# Patient Record
Sex: Female | Born: 1950 | Race: White | Hispanic: No | Marital: Single | Smoking: Never smoker
Health system: Southern US, Community
[De-identification: ages and names within clinical notes are randomized; demographics above are authoritative.]

## PROBLEM LIST (undated history)

## (undated) DIAGNOSIS — M1990 Unspecified osteoarthritis, unspecified site: Secondary | ICD-10-CM

## (undated) DIAGNOSIS — Z8489 Family history of other specified conditions: Secondary | ICD-10-CM

## (undated) DIAGNOSIS — E079 Disorder of thyroid, unspecified: Secondary | ICD-10-CM

## (undated) DIAGNOSIS — I10 Essential (primary) hypertension: Secondary | ICD-10-CM

## (undated) DIAGNOSIS — T7840XA Allergy, unspecified, initial encounter: Secondary | ICD-10-CM

## (undated) DIAGNOSIS — E785 Hyperlipidemia, unspecified: Secondary | ICD-10-CM

## (undated) DIAGNOSIS — N390 Urinary tract infection, site not specified: Secondary | ICD-10-CM

## (undated) DIAGNOSIS — B019 Varicella without complication: Secondary | ICD-10-CM

## (undated) DIAGNOSIS — K219 Gastro-esophageal reflux disease without esophagitis: Secondary | ICD-10-CM

## (undated) DIAGNOSIS — K635 Polyp of colon: Secondary | ICD-10-CM

## (undated) DIAGNOSIS — R519 Headache, unspecified: Secondary | ICD-10-CM

## (undated) DIAGNOSIS — E119 Type 2 diabetes mellitus without complications: Secondary | ICD-10-CM

## (undated) DIAGNOSIS — E039 Hypothyroidism, unspecified: Secondary | ICD-10-CM

## (undated) HISTORY — DX: Polyp of colon: K63.5

## (undated) HISTORY — DX: Hyperlipidemia, unspecified: E78.5

## (undated) HISTORY — DX: Urinary tract infection, site not specified: N39.0

## (undated) HISTORY — DX: Disorder of thyroid, unspecified: E07.9

## (undated) HISTORY — DX: Essential (primary) hypertension: I10

## (undated) HISTORY — PX: DILATION AND CURETTAGE OF UTERUS: SHX78

## (undated) HISTORY — DX: Headache, unspecified: R51.9

## (undated) HISTORY — DX: Varicella without complication: B01.9

## (undated) HISTORY — DX: Gastro-esophageal reflux disease without esophagitis: K21.9

## (undated) HISTORY — PX: PLANTAR FASCIA SURGERY: SHX746

## (undated) HISTORY — PX: OTHER SURGICAL HISTORY: SHX169

## (undated) HISTORY — PX: KNEE ARTHROSCOPY: SHX127

## (undated) HISTORY — DX: Allergy, unspecified, initial encounter: T78.40XA

## (undated) HISTORY — PX: HEMORROIDECTOMY: SUR656

## (undated) HISTORY — DX: Unspecified osteoarthritis, unspecified site: M19.90

## (undated) HISTORY — PX: CYST EXCISION: SHX5701

## (undated) HISTORY — PX: BILATERAL CARPAL TUNNEL RELEASE: SHX6508

---

## 2017-07-09 DIAGNOSIS — B029 Zoster without complications: Secondary | ICD-10-CM | POA: Insufficient documentation

## 2017-07-09 HISTORY — DX: Zoster without complications: B02.9

## 2020-11-04 ENCOUNTER — Encounter: Payer: Self-pay | Admitting: Family

## 2020-11-04 ENCOUNTER — Other Ambulatory Visit: Payer: Self-pay

## 2020-11-04 ENCOUNTER — Ambulatory Visit (INDEPENDENT_AMBULATORY_CARE_PROVIDER_SITE_OTHER): Payer: Medicare Other | Admitting: Family

## 2020-11-04 VITALS — BP 132/80 | HR 82 | Temp 98.1°F | Ht 67.0 in | Wt 268.8 lb

## 2020-11-04 DIAGNOSIS — N898 Other specified noninflammatory disorders of vagina: Secondary | ICD-10-CM | POA: Diagnosis not present

## 2020-11-04 DIAGNOSIS — I10 Essential (primary) hypertension: Secondary | ICD-10-CM

## 2020-11-04 DIAGNOSIS — T148XXA Other injury of unspecified body region, initial encounter: Secondary | ICD-10-CM

## 2020-11-04 DIAGNOSIS — N840 Polyp of corpus uteri: Secondary | ICD-10-CM

## 2020-11-04 DIAGNOSIS — Z1231 Encounter for screening mammogram for malignant neoplasm of breast: Secondary | ICD-10-CM | POA: Diagnosis not present

## 2020-11-04 DIAGNOSIS — B0229 Other postherpetic nervous system involvement: Secondary | ICD-10-CM

## 2020-11-04 DIAGNOSIS — G5603 Carpal tunnel syndrome, bilateral upper limbs: Secondary | ICD-10-CM

## 2020-11-04 DIAGNOSIS — G4730 Sleep apnea, unspecified: Secondary | ICD-10-CM

## 2020-11-04 DIAGNOSIS — Z9889 Other specified postprocedural states: Secondary | ICD-10-CM

## 2020-11-04 DIAGNOSIS — Z7189 Other specified counseling: Secondary | ICD-10-CM

## 2020-11-04 DIAGNOSIS — J309 Allergic rhinitis, unspecified: Secondary | ICD-10-CM

## 2020-11-04 DIAGNOSIS — M722 Plantar fascial fibromatosis: Secondary | ICD-10-CM

## 2020-11-04 DIAGNOSIS — E785 Hyperlipidemia, unspecified: Secondary | ICD-10-CM

## 2020-11-04 DIAGNOSIS — R42 Dizziness and giddiness: Secondary | ICD-10-CM

## 2020-11-04 HISTORY — DX: Other specified postprocedural states: Z98.890

## 2020-11-04 HISTORY — DX: Plantar fascial fibromatosis: M72.2

## 2020-11-04 HISTORY — DX: Carpal tunnel syndrome, bilateral upper limbs: G56.03

## 2020-11-04 HISTORY — DX: Other specified counseling: Z71.89

## 2020-11-04 HISTORY — DX: Other injury of unspecified body region, initial encounter: T14.8XXA

## 2020-11-04 MED ORDER — LEVOTHYROXINE SODIUM 25 MCG PO TABS: 25.0000 ug | ORAL_TABLET | Freq: Every day | ORAL | 1 refills | Status: DC

## 2020-11-04 MED ORDER — FENOFIBRATE 134 MG PO CAPS: 134.0000 mg | ORAL_CAPSULE | Freq: Every day | ORAL | 1 refills | Status: DC

## 2020-11-04 MED ORDER — ATORVASTATIN CALCIUM 10 MG PO TABS: 1.0000 | ORAL_TABLET | Freq: Every day | ORAL | 3 refills | Status: DC

## 2020-11-04 MED ORDER — AMITRIPTYLINE HCL 10 MG PO TABS: 10.0000 mg | ORAL_TABLET | Freq: Every day | ORAL | 1 refills | Status: DC

## 2020-11-04 MED ORDER — GABAPENTIN 300 MG PO CAPS: 300.0000 mg | ORAL_CAPSULE | Freq: Two times a day (BID) | ORAL | 0 refills | Status: DC

## 2020-11-04 MED ORDER — OMEPRAZOLE 20 MG PO CPDR: 1.0000 | DELAYED_RELEASE_CAPSULE | Freq: Every day | ORAL | 1 refills | Status: DC

## 2020-11-04 MED ORDER — LOSARTAN POTASSIUM 50 MG PO TABS: 1.0000 | ORAL_TABLET | Freq: Every day | ORAL | 1 refills | Status: DC

## 2020-11-04 MED ORDER — LEVOCETIRIZINE DIHYDROCHLORIDE 5 MG PO TABS: 5.0000 mg | ORAL_TABLET | Freq: Every day | ORAL | 3 refills | Status: DC

## 2020-11-04 NOTE — Assessment & Plan Note (Signed)
Uses CPAP while sleeping nightly.

## 2020-11-04 NOTE — Progress Notes (Signed)
Tricia Dodson is a 70 y.o. female with the following history as recorded in EpicCare:  There are no problems to display for this patient.   Current Outpatient Medications  Medication Sig Dispense Refill  . Ascorbic Acid (VITAMIN C) 100 MG tablet Take 100 mg by mouth daily.    . vitamin E 1000 UNIT capsule Take 1,000 Units by mouth daily.    Marland Kitchen amitriptyline (ELAVIL) 10 MG tablet Take 1 tablet (10 mg total) by mouth at bedtime. 90 tablet 1  . atorvastatin (LIPITOR) 10 MG tablet Take 1 tablet (10 mg total) by mouth daily. 90 tablet 3  . fenofibrate micronized (LOFIBRA) 134 MG capsule Take 1 capsule (134 mg total) by mouth daily. 90 capsule 1  . gabapentin (NEURONTIN) 300 MG capsule Take 1 capsule (300 mg total) by mouth 2 (two) times daily. 180 capsule 0  . levocetirizine (XYZAL) 5 MG tablet Take 1 tablet (5 mg total) by mouth daily. 90 tablet 3  . levothyroxine (SYNTHROID) 25 MCG tablet Take 1 tablet (25 mcg total) by mouth daily. 90 tablet 1  . losartan (COZAAR) 50 MG tablet Take 1 tablet (50 mg total) by mouth daily. 90 tablet 1  . omeprazole (PRILOSEC) 20 MG capsule Take 1 capsule (20 mg total) by mouth daily. 90 capsule 1   No current facility-administered medications for this visit.    Allergies: Patient has no known allergies.  No past medical history on file.    No family history on file.  Social History   Tobacco Use  . Smoking status: Former Games developer  . Smokeless tobacco: Never Used  Substance Use Topics  . Alcohol use: Never    Subjective:  Presents today as a new patient; moved here from [place] in December 2021- needing to get established with new provider;  History of : 1) HTN; 2) Hyperlipidemia on Atorvastatin and Fenofibrate; 3) Allergic Rhinitis; 4) Hypothyroidism; 5) GERD; 6) PHN; 7) Sleep Apnea; 8) Chronic Insomnia;    Objective:  Vitals:   11/04/20 1330  BP: 132/80  Pulse: 82  Temp: 98.1 F (36.7 C)  TempSrc: Oral  SpO2: 96%  Weight: 268 lb 12.8 oz (121.9  kg)  Height: 5\' 7"  (1.702 m)    General: Well developed, well nourished, in no acute distress  Skin : Warm and dry.  Head: Normocephalic and atraumatic  Eyes: Sclera and conjunctiva clear; pupils round and reactive to light; extraocular movements intact  Ears: External normal; canals clear; tympanic membranes normal  Oropharynx: Pink, supple. No suspicious lesions  Neck: Supple without thyromegaly, adenopathy  Lungs: Respirations unlabored; clear to auscultation bilaterally without wheeze, rales, rhonchi  CVS exam: normal rate and regular rhythm.  Musculoskeletal: No deformities; no active joint inflammation  Extremities: No edema, cyanosis, clubbing  Vessels: Symmetric bilaterally  Neurologic: Alert and oriented; speech intact; face symmetrical; moves all extremities well; CNII-XII intact without focal deficit   Assessment:  1. PHN (postherpetic neuralgia)   2. Visit for screening mammogram   3. Vaginal itching   4. Uterine polyp   5. Sleep apnea, unspecified type   6. Dizziness   7. Primary hypertension   8. Hyperlipidemia, unspecified hyperlipidemia type   9. Allergic rhinitis, unspecified seasonality, unspecified trigger     Plan:  1. Increase Gabapentin to 300 mg bid; evaluate response in 1 month;  2. Order updated; 3. Chronic issue- per patient, recent blood sugar normal; refer to GYN- ? Lichen sclerosis; 4. Refer to GYN- known issue; to discuss management with  GYN; 5. Refer to neurology- needs updated sleep study in order to get new CPAP; 6. ? Etiology; she will start checking her blood pressure 2x per day; refer to cardiology- need to consider A. Fib with her symptom complaints; strong FH of CAD on paternal side;  7. Stable; continue same medications for now; 8. Stable; refills updated; request recent labs from previous PCP; 9. Stable; refill updated;  This visit occurred during the SARS-CoV-2 public health emergency.  Safety protocols were in place, including screening  questions prior to the visit, additional usage of staff PPE, and extensive cleaning of exam room while observing appropriate contact time as indicated for disinfecting solutions.     Return in about 1 month (around 12/04/2020).  Orders Placed This Encounter  Procedures  . MM Digital Screening    Standing Status:   Future    Standing Expiration Date:   11/04/2021    Order Specific Question:   Reason for Exam (SYMPTOM  OR DIAGNOSIS REQUIRED)    Answer:   screening mammogram    Order Specific Question:   Preferred imaging location?    Answer:   [facility]  . Ambulatory referral to Gynecology    Referral Priority:   Routine    Referral Type:   Consultation    Referral Reason:   Specialty Services Required    Requested Specialty:   Gynecology    Number of Visits Requested:   1  . Ambulatory referral to Neurology    Referral Priority:   Routine    Referral Type:   Consultation    Referral Reason:   Specialty Services Required    Requested Specialty:   Neurology    Number of Visits Requested:   1  . Ambulatory referral to Cardiology    Referral Priority:   Routine    Referral Type:   Consultation    Referral Reason:   Specialty Services Required    Requested Specialty:   Cardiology    Number of Visits Requested:   1    Requested Prescriptions   Signed Prescriptions Disp Refills  . gabapentin (NEURONTIN) 300 MG capsule 180 capsule 0    Sig: Take 1 capsule (300 mg total) by mouth 2 (two) times daily.  Marland Kitchen amitriptyline (ELAVIL) 10 MG tablet 90 tablet 1    Sig: Take 1 tablet (10 mg total) by mouth at bedtime.  Marland Kitchen atorvastatin (LIPITOR) 10 MG tablet 90 tablet 3    Sig: Take 1 tablet (10 mg total) by mouth daily.  . fenofibrate micronized (LOFIBRA) 134 MG capsule 90 capsule 1    Sig: Take 1 capsule (134 mg total) by mouth daily.  Marland Kitchen levocetirizine (XYZAL) 5 MG tablet 90 tablet 3    Sig: Take 1 tablet (5 mg total) by mouth daily.  Marland Kitchen levothyroxine (SYNTHROID) 25 MCG tablet 90  tablet 1    Sig: Take 1 tablet (25 mcg total) by mouth daily.  Marland Kitchen losartan (COZAAR) 50 MG tablet 90 tablet 1    Sig: Take 1 tablet (50 mg total) by mouth daily.  Marland Kitchen omeprazole (PRILOSEC) 20 MG capsule 90 capsule 1    Sig: Take 1 capsule (20 mg total) by mouth daily.

## 2020-11-16 ENCOUNTER — Other Ambulatory Visit (HOSPITAL_BASED_OUTPATIENT_CLINIC_OR_DEPARTMENT_OTHER): Payer: Self-pay | Admitting: Family

## 2020-11-21 ENCOUNTER — Encounter (HOSPITAL_BASED_OUTPATIENT_CLINIC_OR_DEPARTMENT_OTHER): Payer: Self-pay

## 2020-11-21 ENCOUNTER — Ambulatory Visit (HOSPITAL_BASED_OUTPATIENT_CLINIC_OR_DEPARTMENT_OTHER)
Admission: RE | Admit: 2020-11-21 | Discharge: 2020-11-21 | Disposition: A | Discharge status: Home / Self Care | Payer: Medicare Other | Source: Ambulatory Visit | Attending: Family | Admitting: Family

## 2020-11-21 ENCOUNTER — Other Ambulatory Visit: Payer: Self-pay

## 2020-11-21 DIAGNOSIS — Z1231 Encounter for screening mammogram for malignant neoplasm of breast: Secondary | ICD-10-CM

## 2020-11-28 ENCOUNTER — Other Ambulatory Visit: Payer: Self-pay | Admitting: Family

## 2020-11-28 DIAGNOSIS — R928 Other abnormal and inconclusive findings on diagnostic imaging of breast: Secondary | ICD-10-CM

## 2020-11-30 ENCOUNTER — Encounter: Payer: Self-pay | Admitting: Neurology

## 2020-11-30 ENCOUNTER — Ambulatory Visit (INDEPENDENT_AMBULATORY_CARE_PROVIDER_SITE_OTHER): Payer: Medicare Other | Admitting: Neurology

## 2020-11-30 VITALS — BP 132/84 | HR 65 | Ht 67.0 in | Wt 270.3 lb

## 2020-11-30 DIAGNOSIS — M2619 Other specified anomalies of jaw-cranial base relationship: Secondary | ICD-10-CM

## 2020-11-30 DIAGNOSIS — Z9989 Dependence on other enabling machines and devices: Secondary | ICD-10-CM | POA: Insufficient documentation

## 2020-11-30 DIAGNOSIS — G4733 Obstructive sleep apnea (adult) (pediatric): Secondary | ICD-10-CM

## 2020-11-30 HISTORY — DX: Obstructive sleep apnea (adult) (pediatric): G47.33

## 2020-11-30 HISTORY — DX: Morbid (severe) obesity due to excess calories: E66.01

## 2020-11-30 HISTORY — DX: Other specified anomalies of jaw-cranial base relationship: M26.19

## 2020-11-30 HISTORY — DX: Dependence on other enabling machines and devices: Z99.89

## 2020-11-30 NOTE — Progress Notes (Signed)
SLEEP MEDICINE CLINIC    Provider:  Melvyn Novasarmen  Sheri Gatchel, MD  Primary Care Physician:  Tricia BassMurray, Laura Woodruff, FNP [address] [place] [id number]     Referring Provider: Olive BassMurray, Laura Woodruff, Fnp [address] [place] [id number]          Chief Complaint according to patient   Patient presents with:    . New Patient (Initial Visit)     Prior sleep study has been on CPAP for over 15 years. Reports she is here to start process of getting new CPAP machine. CPAP is 70 years old and displayed a message thati t has exceeded its lifetime. "[place] [facility] I am CPAP dependent "      HISTORY OF PRESENT ILLNESS:  Tricia Dodson is a 70 - year- old Caucasian female patient seen here upon FNP referral on 11/30/2020 from Tricia Dodson, her primary care provider.   Chief concern according to patient : see above     Tricia Dodson  has a past medical history of Allergy, Arthritis, Chicken pox, Colon polyps, GERD (gastroesophageal reflux disease), HA (headache), Hyperlipidemia, Hypertension, Thyroid disease, and UTI (urinary tract infection)..   The patient had the first sleep study in the year 2005  with a result of OSA being found. She is on her third CPAP, 70 years old.  She had surgery for her foot and her anesthetist asked her if she knew she had apnea, she did not. A year later she was tested. .    Sleep relevant medical history: Nocturia-none since CPAP-insomnia  In some nights. Obesity, sinus rhinitis-  Goiter.    Family medical /sleep history:Brother  on CPAP with OSA.    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  and lives in a household alone. No children. Pets are Present. 3 cats. 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  She had insomnia often at that time.  Tobacco use; none .   ETOH use; socially , Caffeine intake in form of  Coffee( 1 cup in AM) Tea (not and iced -  3 or more a day) . Regular exercise in form of walking. Has knee pain.         Sleep habits are as follows: The patient's dinner time is between 6 PM. The patient goes to bed at 11-12 PM and continues to sleep for intervals of 3 hours, wakes for 30 minutes, reads in book, not bathroom breaks.   The preferred sleep position is laterally , with the support of one pillow.GERD in the past, not currently.  Dreams are reportedly infrequent. 8  AM is the usual rise time.  The patient wakes up spontaneously.  She reports not feeling refreshed or restored in AM if not using CPAP-, but on CPAP she re rarely has  symptoms such as dry mouth , morning headaches , congestion- and residual fatigue.  Naps are taken rarely lasting from 15-30 minutes .    Reviews of CPAP download_  Tricia Dodson has used her CPAP compliantly for the last 30 days 29 out of 30 days with a compliance of over 4 hours for 90% of the time.  Average user time is 8 hours 28 minutes.  Her air sense 10 AutoSet which reportedly is about 70 years old or older as a serial #[id number] 06/25/1966 1.  It is  on factory settings and was never tailored to her needs a minimum pressure is 4 maximum pressure is 20 EPR level is 3 cmH2O.  She has achieved a AHI residual of only 1.4 apneas and hypopneas per hour of use with these being obstructive in origin.  Air leaks are moderate to mild at the 95th percentile 11.3 L/min.  The pressure at the 95th percentile is only 8.9 cmH2O.  She is using a nasal mask , N 20.     Review of Systems: Out of a complete 14 system review, the patient complains of only the following symptoms, and all other reviewed systems are negative.:   Singles right body, pain, neuropathic.  Fatigue, sleepiness , snoring, fragmented sleep, Insomnia once or twice a month.    How likely are you to doze in the following situations: 0 = not likely, 1 = slight chance, 2 = moderate chance, 3 = high  chance   Sitting and Reading? Watching Television? Sitting inactive in a public place (theater or meeting)? As a passenger in a car for an hour without a break? Lying down in the afternoon when circumstances permit? Sitting and talking to someone? Sitting quietly after lunch without alcohol? In a car, while stopped for a few minutes in traffic?   Total = 3/ 24 points  On CPAP.   FSS endorsed at  22/ 63 points.   Social History   Socioeconomic History  . Marital status: Single    Spouse name: Not on file  . Number of children: Not on file  . Years of education: Not on file  . Highest education level: Not on file  Occupational History  . Not on file  Tobacco Use  . Smoking status: Former Games developer  . Smokeless tobacco: Never Used  Substance and Sexual Activity  . Alcohol use: Never  . Drug use: Not on file  . Sexual activity: Not on file  Other Topics Concern  . Not on file  Social History Narrative  . Not on file   Social Determinants of Health   Financial Resource Strain: Not on file  Food Insecurity: Not on file  Transportation Needs: Not on file  Physical Activity: Not on file  Stress: Not on file  Social Connections: Not on file    Family History  Problem Relation Age of Onset  . Arthritis Mother   . Diabetes Father   . Heart disease Father   . Arthritis Sister   . Diabetes Sister   . Hyperlipidemia Sister   . Hyperlipidemia Brother   . Heart disease Brother        triple heart bypass    Past Medical History:  Diagnosis Date  . Allergy   . Arthritis   . Chicken pox   . Colon polyps   . GERD (gastroesophageal reflux disease)   . HA (headache)   . Hyperlipidemia   . Hypertension   . Thyroid disease   . UTI (urinary tract infection)     Past Surgical History:  Procedure Laterality Date  . chicken pox    . right hand cyst removal       Current Outpatient Medications on File Prior to Visit  Medication Sig Dispense Refill  . amitriptyline  (ELAVIL) 10 MG tablet Take 1 tablet (10 mg total) by mouth at bedtime. 90 tablet 1  . atorvastatin (LIPITOR) 10 MG tablet Take 1 tablet (10 mg total) by mouth daily. 90 tablet 3  . fenofibrate micronized (LOFIBRA) 134 MG capsule  Take 1 capsule (134 mg total) by mouth daily. 90 capsule 1  . gabapentin (NEURONTIN) 300 MG capsule Take 1 capsule (300 mg total) by mouth 2 (two) times daily. 180 capsule 0  . levocetirizine (XYZAL) 5 MG tablet Take 1 tablet (5 mg total) by mouth daily. 90 tablet 3  . levothyroxine (SYNTHROID) 25 MCG tablet Take 1 tablet (25 mcg total) by mouth daily. 90 tablet 1  . losartan (COZAAR) 50 MG tablet Take 1 tablet (50 mg total) by mouth daily. 90 tablet 1  . omeprazole (PRILOSEC) 20 MG capsule Take 1 capsule (20 mg total) by mouth daily. 90 capsule 1   No current facility-administered medications on file prior to visit.    Physical exam:  Today's Vitals   11/30/20 0852  BP: 132/84  Pulse: 65  SpO2: 96%  Weight: 270 lb 5 oz (122.6 kg)  Height: 5\' 7"  (1.702 m)   Body mass index is 42.34 kg/m.   Wt Readings from Last 3 Encounters:  11/30/20 270 lb 5 oz (122.6 kg)  11/04/20 268 lb 12.8 oz (121.9 kg)     Ht Readings from Last 3 Encounters:  11/30/20 5\' 7"  (1.702 m)  11/04/20 5\' 7"  (1.702 m)      General: The patient is awake, alert and appears not in acute distress. The patient is well groomed. Head: Normocephalic, atraumatic.  Neck is supple. Mallampati 2 ,  neck circumference:16. 25  inches .  Nasal airflow  patent.  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  Dental status:  Cardiovascular:  Regular rate and cardiac rhythm by pulse,  without distended neck veins. Respiratory: Lungs are clear to auscultation.  Skin:  With evidence of mild ankle edema on the left- no visible rash  Trunk: The patient's posture is erect.   Neurologic exam : The patient is awake and alert, oriented to place and time.   Memory subjective described as intact.   Attention span & concentration ability appears normal.  Speech is fluent,  without  dysarthria, dysphonia or aphasia.  Mood and affect are appropriate.   Cranial nerves: no loss of smell or taste reported  Pupils are equal and briskly reactive to light. Funduscopic exam differed.  Extraocular movements in vertical and horizontal planes were intact and without nystagmus. No Diplopia. Visual fields by finger perimetry are intact. Hearing was reduced to soft voice and finger rubbing.  Tinnitus.   Facial sensation intact to fine touch.  Facial motor strength is symmetric and tongue and uvula move midline.  Neck ROM :  Right sided rotation is limited- droopy shoulders   tilt and flexion extension were normal for age and shoulder shrug was symmetrical.    Motor exam:  Symmetric bulk, tone and ROM.   Normal tone without cog- wheeling, symmetric grip strength- very reduced in strength. Left thenar eminence was reduced in size. - Status post carpal tunnel surgery.    Sensory:  Fine touch, pinprick and vibration were normal. Right body shingles have left her with some tingling in the affected area.  Proprioception tested in the upper extremities was normal.   Coordination: Rapid alternating movements in the fingers/hands were of normal speed.  The Finger-to-nose maneuver was intact without evidence of ataxia, dysmetria or tremor.   Gait and station: Patient could rise unassisted from a seated position, walked without assistive device.  Stance is of normal width/ base and the patient turned with 3 steps.  Toe and heel walk were deferred.  Deep tendon reflexes: in  the  upper and lower extremities are symmetric and intact.  Babinski response was deferred.     After spending a total time of 45 minutes face to face and additional time for physical and neurologic examination, review of laboratory studies,  personal review of imaging studies, reports and results of other testing and review of  referral information / records as far as provided in visit, I have established the following assessments:  1) successful CPAP user with reported CPAP dependence in need of a new machine to allow uninterrupted therapy.  2) advancing bed time by 45 minutes, hot shower before bedtime.  Weight loss encouraged.   3) no medication needed, sleeping well for the most time. OSA risk factors include retrognathia and poor muscle tone.    My Plan is to proceed with:  1)  PSG split -or HST to evaluate current level and type of apnea.  2)  Wants to keep Nasal mask.    I would like to thank Tricia Bass, FNP and Tricia Bass, Fnp [address] [place],  [place] [address] for allowing me to meet with and to take care of this pleasant patient.   II plan to follow up either personally or through our Tricia within 1-2 month.   CC: I will share my notes with PCP.   Electronically signed by: Tricia Novas, MD 11/30/2020 9:25 AM  [facility] Board certified by The [employer] of Sleep Medicine and Diplomate of the [employer] of Sleep Medicine. Board certified In Neurology through the ABPN, Fellow of the [employer] of Neurology. Medical Director of [facility].

## 2020-12-01 DIAGNOSIS — I10 Essential (primary) hypertension: Secondary | ICD-10-CM | POA: Insufficient documentation

## 2020-12-01 DIAGNOSIS — T7840XA Allergy, unspecified, initial encounter: Secondary | ICD-10-CM | POA: Insufficient documentation

## 2020-12-01 DIAGNOSIS — K219 Gastro-esophageal reflux disease without esophagitis: Secondary | ICD-10-CM | POA: Insufficient documentation

## 2020-12-01 DIAGNOSIS — E079 Disorder of thyroid, unspecified: Secondary | ICD-10-CM | POA: Insufficient documentation

## 2020-12-01 DIAGNOSIS — M1990 Unspecified osteoarthritis, unspecified site: Secondary | ICD-10-CM | POA: Insufficient documentation

## 2020-12-01 DIAGNOSIS — K635 Polyp of colon: Secondary | ICD-10-CM | POA: Insufficient documentation

## 2020-12-01 DIAGNOSIS — E785 Hyperlipidemia, unspecified: Secondary | ICD-10-CM | POA: Insufficient documentation

## 2020-12-01 DIAGNOSIS — E039 Hypothyroidism, unspecified: Secondary | ICD-10-CM | POA: Insufficient documentation

## 2020-12-01 DIAGNOSIS — N390 Urinary tract infection, site not specified: Secondary | ICD-10-CM | POA: Insufficient documentation

## 2020-12-01 DIAGNOSIS — B019 Varicella without complication: Secondary | ICD-10-CM | POA: Insufficient documentation

## 2020-12-01 DIAGNOSIS — R519 Headache, unspecified: Secondary | ICD-10-CM | POA: Insufficient documentation

## 2020-12-02 ENCOUNTER — Ambulatory Visit (INDEPENDENT_AMBULATORY_CARE_PROVIDER_SITE_OTHER): Payer: Medicare Other | Admitting: Cardiology

## 2020-12-02 ENCOUNTER — Other Ambulatory Visit: Payer: Self-pay

## 2020-12-02 ENCOUNTER — Ambulatory Visit (INDEPENDENT_AMBULATORY_CARE_PROVIDER_SITE_OTHER): Payer: Medicare Other

## 2020-12-02 ENCOUNTER — Encounter: Payer: Self-pay | Admitting: Cardiology

## 2020-12-02 VITALS — BP 128/76 | HR 84 | Ht 67.0 in | Wt 268.0 lb

## 2020-12-02 DIAGNOSIS — Z9989 Dependence on other enabling machines and devices: Secondary | ICD-10-CM

## 2020-12-02 DIAGNOSIS — I10 Essential (primary) hypertension: Secondary | ICD-10-CM | POA: Diagnosis not present

## 2020-12-02 DIAGNOSIS — R42 Dizziness and giddiness: Secondary | ICD-10-CM

## 2020-12-02 DIAGNOSIS — R9431 Abnormal electrocardiogram [ECG] [EKG]: Secondary | ICD-10-CM | POA: Diagnosis not present

## 2020-12-02 DIAGNOSIS — G4733 Obstructive sleep apnea (adult) (pediatric): Secondary | ICD-10-CM

## 2020-12-02 NOTE — Addendum Note (Signed)
Addended by: Hazle Quant on: 12/02/2020 10:34 AM   Modules accepted: Orders

## 2020-12-02 NOTE — Patient Instructions (Signed)
Medication Instructions:  Your physician recommends that you continue on your current medications as directed. Please refer to the Current Medication list given to you today.  *If you need a refill on your cardiac medications before your next appointment, please call your pharmacy*   Lab Work: none If you have labs (blood work) drawn today and your tests are completely normal, you will receive your results only by: Marland Kitchen MyChart Message (if you have MyChart) OR . A paper copy in the mail If you have any lab test that is abnormal or we need to change your treatment, we will call you to review the results.   Testing/Procedures: A zio monitor was ordered today. It will remain on for 14 days. You will then return monitor and event diary in provided box. It takes 1-2 weeks for report to be downloaded and returned to [place]. We will call you with the results. If monitor falls off or has orange flashing light, please call Zio for further instructions.   Your physician has requested that you have an echocardiogram. Echocardiography is a painless test that uses sound waves to create images of your heart. It provides your doctor with information about the size and shape of your heart and how well your heart's chambers and valves are working. This procedure takes approximately one hour. There are no restrictions for this procedure.     Follow-Up: At [facility], you and your health needs are our priority.  As part of our continuing mission to provide you with exceptional heart care, we have created designated Provider Care Teams.  These Care Teams include your primary Cardiologist (physician) and Advanced Practice Providers (APPs -  Physician Assistants and Nurse Practitioners) who all work together to provide you with the care you need, when you need it.  We recommend signing up for the patient portal called "MyChart".  Sign up information is provided on this After Visit Summary.  MyChart is used to  connect with patients for Virtual Visits (Telemedicine).  Patients are able to view lab/test results, encounter notes, upcoming appointments, etc.  Non-urgent messages can be sent to your provider as well.   To learn more about what you can do with MyChart, go to [email or web address].    Your next appointment:   3 month(s)  The format for your next appointment:   In Person  Provider:   Gypsy Balsam, MD   Other Instructions   Echocardiogram An echocardiogram is a test that uses sound waves (ultrasound) to produce images of the heart. Images from an echocardiogram can provide important information about:  Heart size and shape.  The size and thickness and movement of your heart's walls.  Heart muscle function and strength.  Heart valve function or if you have stenosis. Stenosis is when the heart valves are too narrow.  If blood is flowing backward through the heart valves (regurgitation).  A tumor or infectious growth around the heart valves.  Areas of heart muscle that are not working well because of poor blood flow or injury from a heart attack.  Aneurysm detection. An aneurysm is a weak or damaged part of an artery wall. The wall bulges out from the normal force of blood pumping through the body. Tell a health care provider about:  Any allergies you have.  All medicines you are taking, including vitamins, herbs, eye drops, creams, and over-the-counter medicines.  Any blood disorders you have.  Any surgeries you have had.  Any medical conditions you have.  Whether you  are pregnant or may be pregnant. What are the risks? Generally, this is a safe test. However, problems may occur, including an allergic reaction to dye (contrast) that may be used during the test. What happens before the test? No specific preparation is needed. You may eat and drink normally. What happens during the test?  You will take off your clothes from the waist up and put on a hospital  gown.  Electrodes or electrocardiogram (ECG)patches may be placed on your chest. The electrodes or patches are then connected to a device that monitors your heart rate and rhythm.  You will lie down on a table for an ultrasound exam. A gel will be applied to your chest to help sound waves pass through your skin.  A handheld device, called a transducer, will be pressed against your chest and moved over your heart. The transducer produces sound waves that travel to your heart and bounce back (or "echo" back) to the transducer. These sound waves will be captured in real-time and changed into images of your heart that can be viewed on a video monitor. The images will be recorded on a computer and reviewed by your health care provider.  You may be asked to change positions or hold your breath for a short time. This makes it easier to get different views or better views of your heart.  In some cases, you may receive contrast through an IV in one of your veins. This can improve the quality of the pictures from your heart. The procedure may vary among health care providers and hospitals.   What can I expect after the test? You may return to your normal, everyday life, including diet, activities, and medicines, unless your health care provider tells you not to do that. Follow these instructions at home:  It is up to you to get the results of your test. Ask your health care provider, or the department that is doing the test, when your results will be ready.  Keep all follow-up visits. This is important. Summary  An echocardiogram is a test that uses sound waves (ultrasound) to produce images of the heart.  Images from an echocardiogram can provide important information about the size and shape of your heart, heart muscle function, heart valve function, and other possible heart problems.  You do not need to do anything to prepare before this test. You may eat and drink normally.  After the  echocardiogram is completed, you may return to your normal, everyday life, unless your health care provider tells you not to do that. This information is not intended to replace advice given to you by your health care provider. Make sure you discuss any questions you have with your health care provider. Document Revised: 02/16/2020 Document Reviewed: 02/16/2020 Elsevier Patient Education  2021 [employer] Inc.   

## 2020-12-02 NOTE — Progress Notes (Signed)
Cardiology Consultation:    Date:  12/02/2020   ID:  Tricia Dodson, DOB Mar 19, 1951, MRN [id number]  PCP:  Tricia Bass, FNP  Cardiologist:  Gypsy Balsam, MD   Referring MD: Tricia Dodson,*   Chief Complaint  Patient presents with  . Dizziness    History of Present Illness:    Tricia Dodson is a 70 y.o. female who is being seen today for the evaluation of dizziness at the request of Tricia Dodson,*.  Recently she retired at moving to [employer] from [place].  She got established with new primary care physician during the conversation with her primary care physician she mentioned some dizziness.  Apparently about a year ago she did have some lightheadedness as well as dizziness.  She went to her primary care physician she was discovered to have hypertension she was put on losartan and symptoms improved significantly but is still described to have some lightheadedness/dizziness.  Did happen anytime momentary sensation of kind of unsteadiness in her balance.  There is no associated symptoms associated with this.  Overall she is trying to be active he is also very conscious about diet she would like to enjoy her retirement she likes writing books.  She decided also to walk on the regular basis and she does every day and she has no difficulty doing it.  Denies have any chest pain tightness squeezing pressure burning chest, no palpitations, she does have some dyspnea on exertion. She does have family history of premature coronary artery disease 2 of her uncles started having problem before age of 41. She never smoked. She is on Microdiet she said that diet is designed to lose some weight as well as to be good for overall health.  That involve using correct the fact as well as knots.   Past Medical History:  Diagnosis Date  . Allergy   . Arthritis   . Carpal tunnel syndrome, bilateral 11/04/2020   Both wrists.   . Chicken pox   . Colon polyps    . CPAP (continuous positive airway pressure) dependence 11/30/2020  . CPAP use counseling 11/04/2020  . GERD (gastroesophageal reflux disease)   . HA (headache)   . History of vein stripping 11/04/2020   Right leg  . Hyperlipidemia   . Hypertension   . Morbid obesity (HCC) 11/30/2020  . OSA on CPAP 11/30/2020  . Plantar fasciitis, bilateral 11/04/2020   With Bone spors  . 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 Shingles 2019  . Thyroid disease   . Torn ligament 11/04/2020   Right knee  . UTI (urinary tract infection)     Past Surgical History:  Procedure Laterality Date  . BILATERAL CARPAL TUNNEL RELEASE    . Hemorroids removed    . KNEE ARTHROSCOPY Right   . Plantar fasciatis      Repaired  . right hand cyst removal    . vein vascular     surgery    Current Medications: Current Meds  Medication Sig  . amitriptyline (ELAVIL) 10 MG tablet Take 1 tablet (10 mg total) by mouth at bedtime.  Marland Kitchen atorvastatin (LIPITOR) 10 MG tablet Take 1 tablet (10 mg total) by mouth daily. (Patient taking differently: Take 0.5 tablets by mouth daily.)  . fenofibrate micronized (LOFIBRA) 134 MG capsule Take 1 capsule (134 mg total) by mouth daily.  Marland Kitchen gabapentin (NEURONTIN) 300 MG capsule Take 1 capsule (300 mg total) by mouth 2 (two) times daily.  Marland Kitchen levocetirizine (XYZAL) 5 MG  tablet Take 1 tablet (5 mg total) by mouth daily.  Marland Kitchen levothyroxine (SYNTHROID) 25 MCG tablet Take 1 tablet (25 mcg total) by mouth daily.  Marland Kitchen losartan (COZAAR) 50 MG tablet Take 1 tablet (50 mg total) by mouth daily. (Patient taking differently: Take 50 mg by mouth daily.)  . omeprazole (PRILOSEC) 20 MG capsule Take 1 capsule (20 mg total) by mouth daily. (Patient taking differently: Take 20 mg by mouth daily.)     Allergies:   Patient has no known allergies.   Social History   Socioeconomic History  . Marital status: Single    Spouse name: Not on file  . Number of children: Not on file  . Years of education: Not on file  . Highest  education level: Not on file  Occupational History  . Not on file  Tobacco Use  . Smoking status: Former Games developer  . Smokeless tobacco: Never Used  Substance and Sexual Activity  . Alcohol use: Never  . Drug use: Not on file  . Sexual activity: Not on file  Other Topics Concern  . Not on file  Social History Narrative  . Not on file   Social Determinants of Health   Financial Resource Strain: Not on file  Food Insecurity: Not on file  Transportation Needs: Not on file  Physical Activity: Not on file  Stress: Not on file  Social Connections: Not on file     Family History: The patient's family history includes Arthritis in her mother and sister; Diabetes in her father and sister; Heart disease in her brother and father; Hyperlipidemia in her brother and sister. ROS:   Please see the history of present illness.    All 14 point review of systems negative except as described per history of present illness.  EKGs/Labs/Other Studies Reviewed:    The following studies were reviewed today:   EKG:  EKG is  ordered today.  The ekg ordered today demonstrates normal sinus rhythm normal P interval, left axis deviation, left anterior hemiblock, nonspecific ST segment changes  Recent Labs: No results found for requested labs within last 8760 hours.  Recent Lipid Panel No results found for: CHOL, TRIG, HDL, CHOLHDL, VLDL, LDLCALC, LDLDIRECT  Physical Exam:    VS:  BP 128/76 (BP Location: Right Arm, Patient Position: Sitting)   Pulse 84   Ht 5\' 7"  (1.702 m)   Wt 268 lb (121.6 kg)   SpO2 97%   BMI 41.97 kg/m     Wt Readings from Last 3 Encounters:  12/02/20 268 lb (121.6 kg)  11/30/20 270 lb 5 oz (122.6 kg)  11/04/20 268 lb 12.8 oz (121.9 kg)     GEN:  Well nourished, well developed in no acute distress HEENT: Normal NECK: No JVD; No carotid bruits LYMPHATICS: No lymphadenopathy CARDIAC: RRR, no murmurs, no rubs, no gallops RESPIRATORY:  Clear to auscultation without  rales, wheezing or rhonchi  ABDOMEN: Soft, non-tender, non-distended MUSCULOSKELETAL:  No edema; No deformity  SKIN: Warm and dry NEUROLOGIC:  Alert and oriented x 3 PSYCHIATRIC:  Normal affect   ASSESSMENT:    1. OSA on CPAP   2. Dizziness   3. Primary hypertension   4. Nonspecific abnormal electrocardiogram (ECG) (EKG)    PLAN:    In order of problems listed above:  1. Dizziness I will ask her to wear Zio patch for 2weeks to make sure we not dealing with any significant arrhythmia.  She will be also scheduled to have an echocardiogram to assess  left ventricle ejection fraction as well as function of her valve. 2. Essential hypertension: Blood pressure seems to be well controlled today Will do echocardiogram to look for left ventricle hypertrophy as well as to assess significance of left anterior hemiblock which probably is not significant. 3. Dyslipidemia, she is taking cholesterol medication and call her primary care physician to get her fasting lipid profile. 4. 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  Again I do not think that would be something significant. 5. We did talk about healthy lifestyle life in an alkaline I need to exercise on the regular basis and good diet.  She is already doing it.   Medication Adjustments/Labs and Tests Ordered: Current medicines are reviewed at length with the patient today.  Concerns regarding medicines are outlined above.  No orders of the defined types were placed in this encounter.  No orders of the defined types were placed in this encounter.   Signed, Georgeanna Lea, MD, [facility]. 12/02/2020 10:27 AM    [facility]

## 2020-12-06 ENCOUNTER — Ambulatory Visit: Payer: Federal, State, Local not specified - PPO | Admitting: Family

## 2020-12-07 ENCOUNTER — Ambulatory Visit: Payer: Federal, State, Local not specified - PPO | Admitting: Family

## 2020-12-15 DIAGNOSIS — R9431 Abnormal electrocardiogram [ECG] [EKG]: Secondary | ICD-10-CM

## 2020-12-15 DIAGNOSIS — R42 Dizziness and giddiness: Secondary | ICD-10-CM

## 2020-12-15 DIAGNOSIS — I10 Essential (primary) hypertension: Secondary | ICD-10-CM

## 2020-12-15 DIAGNOSIS — G4733 Obstructive sleep apnea (adult) (pediatric): Secondary | ICD-10-CM

## 2020-12-15 DIAGNOSIS — Z9989 Dependence on other enabling machines and devices: Secondary | ICD-10-CM

## 2020-12-16 ENCOUNTER — Ambulatory Visit
Admission: RE | Admit: 2020-12-16 | Discharge: 2020-12-16 | Disposition: A | Discharge status: Home / Self Care | Payer: Medicare Other | Source: Ambulatory Visit | Attending: Family | Admitting: Family

## 2020-12-16 ENCOUNTER — Ambulatory Visit: Payer: Medicare Other

## 2020-12-16 ENCOUNTER — Other Ambulatory Visit: Payer: Self-pay

## 2020-12-16 DIAGNOSIS — R928 Other abnormal and inconclusive findings on diagnostic imaging of breast: Secondary | ICD-10-CM

## 2020-12-20 ENCOUNTER — Other Ambulatory Visit: Payer: Medicare Other

## 2020-12-27 ENCOUNTER — Telehealth: Payer: Self-pay | Admitting: Cardiology

## 2020-12-27 NOTE — Telephone Encounter (Signed)
    Pt is returning call to get heart monitor result, she said to call her on her cell#

## 2020-12-27 NOTE — Telephone Encounter (Signed)
Spoke with patient regarding results and recommendation.  Patient verbalizes understanding and is agreeable to plan of care. Advised patient to call back with any issues or concerns.  

## 2021-01-02 ENCOUNTER — Ambulatory Visit (HOSPITAL_BASED_OUTPATIENT_CLINIC_OR_DEPARTMENT_OTHER)
Admission: RE | Admit: 2021-01-02 | Discharge: 2021-01-02 | Disposition: A | Discharge status: Home / Self Care | Payer: Medicare Other | Source: Ambulatory Visit | Attending: Cardiology | Admitting: Cardiology

## 2021-01-02 ENCOUNTER — Other Ambulatory Visit: Payer: Self-pay

## 2021-01-02 DIAGNOSIS — G4733 Obstructive sleep apnea (adult) (pediatric): Secondary | ICD-10-CM | POA: Diagnosis present

## 2021-01-02 DIAGNOSIS — I10 Essential (primary) hypertension: Secondary | ICD-10-CM

## 2021-01-02 DIAGNOSIS — R9431 Abnormal electrocardiogram [ECG] [EKG]: Secondary | ICD-10-CM

## 2021-01-02 DIAGNOSIS — R42 Dizziness and giddiness: Secondary | ICD-10-CM

## 2021-01-02 DIAGNOSIS — Z9989 Dependence on other enabling machines and devices: Secondary | ICD-10-CM

## 2021-01-02 LAB — ECHOCARDIOGRAM COMPLETE
AR max vel: 2.09 cm2
AV Area VTI: 2.03 cm2
AV Area mean vel: 2 cm2
AV Mean grad: 5 mmHg
AV Peak grad: 9.6 mmHg
Ao pk vel: 1.55 m/s
Area-P 1/2: 3.23 cm2
Calc EF: 73.3 %
S' Lateral: 2.4 cm
Single Plane A2C EF: 75.4 %
Single Plane A4C EF: 69.1 %

## 2021-01-02 NOTE — Progress Notes (Signed)
*  PRELIMINARY RESULTS* Echocardiogram 2D Echocardiogram has been performed.  Neomia Dear RDCS 01/02/2021, 2:42 PM

## 2021-01-04 ENCOUNTER — Other Ambulatory Visit (HOSPITAL_COMMUNITY)
Admission: RE | Admit: 2021-01-04 | Discharge: 2021-01-04 | Disposition: A | Discharge status: Home / Self Care | Payer: Medicare Other | Source: Ambulatory Visit | Attending: Obstetrics & Gynecology | Admitting: Obstetrics & Gynecology

## 2021-01-04 ENCOUNTER — Ambulatory Visit (INDEPENDENT_AMBULATORY_CARE_PROVIDER_SITE_OTHER): Payer: Medicare Other | Admitting: Neurology

## 2021-01-04 ENCOUNTER — Other Ambulatory Visit: Payer: Self-pay

## 2021-01-04 ENCOUNTER — Encounter: Payer: Self-pay | Admitting: Obstetrics & Gynecology

## 2021-01-04 ENCOUNTER — Ambulatory Visit (INDEPENDENT_AMBULATORY_CARE_PROVIDER_SITE_OTHER): Payer: Medicare Other | Admitting: Medical

## 2021-01-04 VITALS — BP 119/82 | HR 72 | Ht 67.0 in | Wt 267.0 lb

## 2021-01-04 DIAGNOSIS — G4733 Obstructive sleep apnea (adult) (pediatric): Secondary | ICD-10-CM

## 2021-01-04 DIAGNOSIS — N840 Polyp of corpus uteri: Secondary | ICD-10-CM

## 2021-01-04 DIAGNOSIS — B9689 Other specified bacterial agents as the cause of diseases classified elsewhere: Secondary | ICD-10-CM

## 2021-01-04 DIAGNOSIS — M2619 Other specified anomalies of jaw-cranial base relationship: Secondary | ICD-10-CM

## 2021-01-04 DIAGNOSIS — N898 Other specified noninflammatory disorders of vagina: Secondary | ICD-10-CM | POA: Diagnosis present

## 2021-01-04 DIAGNOSIS — N760 Acute vaginitis: Secondary | ICD-10-CM

## 2021-01-04 DIAGNOSIS — Z9989 Dependence on other enabling machines and devices: Secondary | ICD-10-CM

## 2021-01-04 MED ORDER — TRIAMCINOLONE ACETONIDE 0.1 % EX CREA
1.0000 | TOPICAL_CREAM | Freq: Two times a day (BID) | CUTANEOUS | 0 refills | Status: DC
Start: 2021-01-04 — End: 2021-01-30

## 2021-01-04 NOTE — Progress Notes (Signed)
History:  [name] is a 70 y.o. G0P0000 who presents to clinic today for vaginal itching. The patient states that this is a chronic issue x 4 years. She states that she has had previous evaluation in [place] and was always told there was "nothing wrong". She has tried OTC vagisil creams and pH cleansers without relief. She states itching is daily, external vaginal area. She had shingles 2.5 years ago in the inguinal area and continues to have changes in sensation in that area adjacent to the itching. She is taking Gabapentin which was recently increased by her PCP. She states that she often notes "bumps" in the area that sometimes appear as ulcerations. She notes only occasional clear discharge without odor. She is not currently sexually active and denies a history of STD infections. She also states a GYN history significant for uterine polyp identified just prior to COVID in [place]. She states that she was told it required surgical removal but that was not done due to COVID. Her last evaluation for this was in 2019. She is postmenopausal x 7 years. She has had spotting episodes very sporadically since the polyp was identified. She denies any history of abnormal pap smear or mammogram. Last screening mammogram was 11/21/20 followed by a left breast diagnostic mammogram on 12/16/20 which was normal.   The following portions of the patient's history were reviewed and updated as appropriate: allergies, current medications, family history, past medical history, social history, past surgical history and problem list.  Review of Systems:  Review of Systems  Constitutional:  Negative for chills, fever and malaise/fatigue.  Gastrointestinal:  Negative for abdominal pain.  Genitourinary:  Negative for dysuria, frequency, hematuria and urgency.       + skin irritation, itching  Skin:  Positive for itching. Negative for rash.     Objective:  Physical Exam BP 119/82   Pulse 72   Ht 5\' 7"  (1.702 m)   Wt  267 lb (121.1 kg)   BMI 41.82 kg/m  Physical Exam Vitals and nursing note reviewed. Exam conducted with a chaperone present.  Constitutional:      General: She is not in acute distress.    Appearance: She is well-developed. She is obese.  HENT:     Head: Normocephalic and atraumatic.  Cardiovascular:     Rate and Rhythm: Normal rate.  Pulmonary:     Effort: Pulmonary effort is normal.  Abdominal:     General: There is no distension.     Palpations: Abdomen is soft.  Genitourinary:    Pubic Area: Rash (diffuse mild erythematous plaques noted on the bilateral labia and perineal region without discharge, bleeding or ulceration note. Excoriations also present making the skin condition difficult to evaluate) present.     Labia:        Right: Rash (diffuse mild erythematous plaques noted on the bilateral labia and perineal region without discharge, bleeding or ulceration note. Excoriations also present making the skin condition difficult to evaluated) and tenderness present. No lesion.        Left: Rash and tenderness present. No lesion.      Vagina: Vaginal discharge (scant, mucous) present. No bleeding.     Cervix: No cervical motion tenderness, discharge or friability.  Skin:    General: Skin is warm and dry.     Findings: No erythema.  Neurological:     Mental Status: She is alert and oriented to person, place, and time.     Labs and Imaging  Health Maintenance Due  Topic Date Due   Hepatitis C Screening  Never done   TETANUS/TDAP  Never done   Zoster Vaccines- Shingrix (1 of 2) Never done   PNA vac Low Risk Adult (1 of 2 - PCV13) Never done   COVID-19 Vaccine (4 - Booster for Pfizer series) 09/04/2020     Assessment & Plan:  1. 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  Dispense: 30 g; Refill: 0 - Cervicovaginal ancillary only( [place]) - Low suspicion of vaginal infection  causing itching, will send patient results via MyChart - Advised to attempt to avoid itching, ice may be helpful in the area to reduce inflammation and itching  - GYN MD to re-evaluate itching and skin irritation at next visit to confirm diagnosis and treatment plan    2. History of uterine polyp - US PELVIC COMPLETE WITH TRANSVAGINAL; Future - Patient to return to [facility] for [place] results, possible endometrial biopsy and discussion of next steps in management possibly including surgery   Approximately 35 minutes of total time was spent with this patient on history taking, patient education, physical exam, coordination of care, treatment management and documentation.   Marny Lowenstein, PA-C 01/04/2021 2:03 PM

## 2021-01-05 LAB — CERVICOVAGINAL ANCILLARY ONLY
Bacterial Vaginitis (gardnerella): POSITIVE — AB
Comment: NEGATIVE
Comment: NEGATIVE
Trichomonas: NEGATIVE

## 2021-01-05 NOTE — Progress Notes (Signed)
   [facility]  HOME SLEEP TEST (Watch PAT) REPORT  STUDY DATE: 06/29-22  DOB: 01/28/1951  MRN: [id number]  ORDERING CLINICIAN: Melvyn Novas, MD, PhD   REFERRING CLINICIAN: Olive Bass, FNP   CLINICAL INFORMATION/HISTORY: Tricia Dodson  has a past medical history of Allergy, Arthritis, Chicken pox, Colon polyps, GERD (gastroesophageal reflux disease), HA (headache), Hyperlipidemia, Hypertension, Thyroid disease, and UTI (urinary tract infection). Severe retrognathia- CPAP dependent.  She had surgery for her foot and her anesthetist asked her if she knew she had apnea, she did not. A year later she was tested.    The patient had the first sleep study in the year 2005  with a result of OSA being found. She is on her third CPAP machine, already 70 years old.    Epworth sleepiness score: 3/24.  BMI: 42.6 kg/m  Neck Circumference: 16.25"  Sleep Summary:   Total Recording Time (hours, min): 9 h 51 min  Total Sleep Time (hours, min):  8 h 39 min   Percent REM (%):    25.88%   Respiratory Indices:   Calculated pAHI (per hour):  42.5/hour         REM pAHI:       47.0/hour       NREM pAHI:   41.0/hour  Supine AHI:   44.0/ hour  Oxygen Saturation Statistics:    Oxygen Saturation (%) Mean: 93%   Minimum oxygen saturation (%):       87%   O2 Saturation Range (%):   87 - 99%    O2 Saturation (minutes) <89 %: 0.1 min  Pulse Rate Statistics:   Pulse Mean (bpm):    93/min    Pulse Range: 55 - 103/min   IMPRESSION: This HST documented persistent severe OSA (obstructive sleep apnea) at AHI of 42.5/h. with Snoring and without hypoxia.   RECOMMENDATION: Autotitration CPAP device with a setting from 5-17 cm water pressure, heated humidification and nasal mask of choice.    INTERPRETING PHYSICIAN:  Melvyn Novas, MD    [facility] and [facility] Sleep Board certified by The [employer] of Sleep Medicine and Diplomate of  the [employer] of Sleep Medicine. Board certified In Neurology through the ABPN, Fellow of the [employer] of Neurology. Medical Director of [employer].

## 2021-01-06 ENCOUNTER — Encounter: Payer: Self-pay | Admitting: Family

## 2021-01-06 ENCOUNTER — Other Ambulatory Visit (INDEPENDENT_AMBULATORY_CARE_PROVIDER_SITE_OTHER): Payer: Medicare Other

## 2021-01-06 ENCOUNTER — Other Ambulatory Visit: Payer: Self-pay

## 2021-01-06 ENCOUNTER — Ambulatory Visit (INDEPENDENT_AMBULATORY_CARE_PROVIDER_SITE_OTHER): Payer: Medicare Other | Admitting: Family

## 2021-01-06 ENCOUNTER — Telehealth: Payer: Self-pay

## 2021-01-06 VITALS — BP 128/78 | HR 65 | Temp 97.5°F | Ht 67.0 in | Wt 273.0 lb

## 2021-01-06 DIAGNOSIS — E785 Hyperlipidemia, unspecified: Secondary | ICD-10-CM | POA: Diagnosis not present

## 2021-01-06 DIAGNOSIS — I10 Essential (primary) hypertension: Secondary | ICD-10-CM

## 2021-01-06 DIAGNOSIS — R7309 Other abnormal glucose: Secondary | ICD-10-CM | POA: Diagnosis not present

## 2021-01-06 DIAGNOSIS — E039 Hypothyroidism, unspecified: Secondary | ICD-10-CM

## 2021-01-06 LAB — COMPREHENSIVE METABOLIC PANEL
ALT: 24 U/L (ref 0–35)
AST: 20 U/L (ref 0–37)
Albumin: 4.1 g/dL (ref 3.5–5.2)
Alkaline Phosphatase: 64 U/L (ref 39–117)
BUN: 15 mg/dL (ref 6–23)
CO2: 28 mEq/L (ref 19–32)
Calcium: 9.3 mg/dL (ref 8.4–10.5)
Chloride: 104 mEq/L (ref 96–112)
Creatinine, Ser: 1.22 mg/dL — ABNORMAL HIGH (ref 0.40–1.20)
GFR: 45.15 mL/min — ABNORMAL LOW (ref >60.00)
Glucose, Bld: 186 mg/dL — ABNORMAL HIGH (ref 70–99)
Potassium: 4.6 mEq/L (ref 3.5–5.1)
Sodium: 140 mEq/L (ref 135–145)
Total Bilirubin: 0.5 mg/dL (ref 0.2–1.2)
Total Protein: 6.6 g/dL (ref 6.0–8.3)

## 2021-01-06 LAB — CBC WITH DIFFERENTIAL/PLATELET
Basophils Absolute: 0.1 10*3/uL (ref 0.0–0.1)
Basophils Relative: 1.2 % (ref 0.0–3.0)
Eosinophils Absolute: 0.1 10*3/uL (ref 0.0–0.7)
Eosinophils Relative: 2.1 % (ref 0.0–5.0)
HCT: 40.7 % (ref 36.0–46.0)
Hemoglobin: 13.5 g/dL (ref 12.0–15.0)
Lymphocytes Relative: 33.2 % (ref 12.0–46.0)
Lymphs Abs: 2.3 10*3/uL (ref 0.7–4.0)
MCHC: 33.3 g/dL (ref 30.0–36.0)
MCV: 85.4 fl (ref 78.0–100.0)
Monocytes Absolute: 0.4 10*3/uL (ref 0.1–1.0)
Monocytes Relative: 6.2 % (ref 3.0–12.0)
Neutro Abs: 3.9 10*3/uL (ref 1.4–7.7)
Neutrophils Relative %: 57.3 % (ref 43.0–77.0)
Platelets: 306 10*3/uL (ref 150.0–400.0)
RBC: 4.76 Mil/uL (ref 3.87–5.11)
RDW: 13.7 % (ref 11.5–15.5)
WBC: 6.8 10*3/uL (ref 4.0–10.5)

## 2021-01-06 LAB — LIPID PANEL
Cholesterol: 155 mg/dL (ref 0–200)
HDL: 42.5 mg/dL (ref >39.00)
LDL Cholesterol: 77 mg/dL (ref 0–99)
NonHDL: 112.04
Total CHOL/HDL Ratio: 4
Triglycerides: 173 mg/dL — ABNORMAL HIGH (ref 0.0–149.0)
VLDL: 34.6 mg/dL (ref 0.0–40.0)

## 2021-01-06 LAB — HEMOGLOBIN A1C: Hgb A1c MFr Bld: 7.5 % — ABNORMAL HIGH (ref 4.6–6.5)

## 2021-01-06 LAB — TSH: TSH: 2.37 u[IU]/mL (ref 0.35–5.50)

## 2021-01-06 MED ORDER — METRONIDAZOLE 500 MG PO TABS: 500.0000 mg | ORAL_TABLET | Freq: Two times a day (BID) | ORAL | 0 refills | Status: DC

## 2021-01-06 NOTE — Progress Notes (Signed)
Tricia Dodson is a 70 y.o. female with the following history as recorded in EpicCare:  Patient Active Problem List   Diagnosis Date Noted   Dizziness 12/02/2020   Nonspecific abnormal electrocardiogram (ECG) (EKG) 12/02/2020   UTI (urinary tract infection)    Thyroid disease    Hypertension    Hyperlipidemia    HA (headache)    GERD (gastroesophageal reflux disease)    Colon polyps    Chicken pox    Arthritis    Allergy    Morbid obesity ([place]) 11/30/2020   Retrognathia 11/30/2020   CPAP (continuous positive airway pressure) dependence 11/30/2020   OSA on CPAP 11/30/2020   CPAP use counseling 11/04/2020   Carpal tunnel syndrome, bilateral 11/04/2020   Plantar fasciitis, bilateral 11/04/2020   Torn ligament 11/04/2020   History of vein stripping 11/04/2020   Shingles 2019    Current Outpatient Medications  Medication Sig Dispense Refill   amitriptyline (ELAVIL) 10 MG tablet Take 1 tablet (10 mg total) by mouth at bedtime. 90 tablet 1   atorvastatin (LIPITOR) 10 MG tablet Take 1 tablet (10 mg total) by mouth daily. (Patient taking differently: Take 10 mg by mouth daily.) 90 tablet 3   fenofibrate micronized (LOFIBRA) 134 MG capsule Take 1 capsule (134 mg total) by mouth daily. 90 capsule 1   gabapentin (NEURONTIN) 300 MG capsule Take 1 capsule (300 mg total) by mouth 2 (two) times daily. 180 capsule 0   levocetirizine (XYZAL) 5 MG tablet Take 1 tablet (5 mg total) by mouth daily. 90 tablet 3   levothyroxine (SYNTHROID) 25 MCG tablet Take 1 tablet (25 mcg total) by mouth daily. 90 tablet 1   losartan (COZAAR) 50 MG tablet Take 1 tablet (50 mg total) by mouth daily. (Patient taking differently: Take 50 mg by mouth daily.) 90 tablet 1   omeprazole (PRILOSEC) 20 MG capsule Take 1 capsule (20 mg total) by mouth daily. (Patient taking differently: Take 20 mg by mouth daily.) 90 capsule 1   triamcinolone cream (KENALOG) 0.1 % Apply 1 application topically 2 (two) times daily. 30 g 0    metroNIDAZOLE (FLAGYL) 500 MG tablet Take 1 tablet (500 mg total) by mouth 2 (two) times daily. 14 tablet 0   No current facility-administered medications for this visit.    Allergies: Patient has no known allergies.  Past Medical History:  Diagnosis Date   Allergy    Arthritis    Carpal tunnel syndrome, bilateral 11/04/2020   Both wrists.    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   CPAP use counseling 11/04/2020   GERD (gastroesophageal reflux disease)    HA (headache)    History of vein stripping 11/04/2020   Right leg   Hyperlipidemia    Hypertension    Morbid obesity ([place]) 11/30/2020   OSA on CPAP 11/30/2020   Plantar fasciitis, bilateral 11/04/2020   With Bone spors   Retrognathia 11/30/2020   Shingles 2019   Thyroid disease    Torn ligament 11/04/2020   Right knee   UTI (urinary tract infection)     Past Surgical History:  Procedure Laterality Date   BILATERAL CARPAL TUNNEL RELEASE     Hemorroids removed     KNEE ARTHROSCOPY Right    Plantar fasciatis      Repaired   right hand cyst removal     vein vascular     surgery    Family History  Problem  Relation Age of Onset   Arthritis Mother    Diabetes Father    Heart disease Father    Arthritis Sister    Diabetes Sister    Hyperlipidemia Sister    Hyperlipidemia Brother    Heart disease Brother        triple heart bypass    Social History   Tobacco Use   Smoking status: Former    Pack years: 0.00   Smokeless tobacco: Never  Substance Use Topics   Alcohol use: Never    Subjective:   1 month follow up on chronic care issues- since last OV, she has been able to see GYN, cardiology and neurology;  Does think she has felt better with increased dosage of Neurontin for PHN; wants to stay at 300 mg bid for now;  Blood pressure readings at home have been consistent with what is seen in the office;   Objective:  Vitals:   01/06/21 0856  BP: 128/78   Pulse: 65  Temp: (!) 97.5 F (36.4 C)  TempSrc: Oral  SpO2: 99%  Weight: 273 lb (123.8 kg)  Height: 5' 7"  (1.702 m)    General: Well developed, well nourished, in no acute distress  Skin : Warm and dry.  Head: Normocephalic and atraumatic  Lungs: Respirations unlabored; clear to auscultation bilaterally without wheeze, rales, rhonchi  CVS exam: normal rate and regular rhythm.  Neurologic: Alert and oriented; speech intact; face symmetrical; moves all extremities well; CNII-XII intact without focal deficit   Assessment:  1. Primary hypertension   2. Hyperlipidemia, unspecified hyperlipidemia type   3. Hypothyroidism, unspecified type     Plan:  Update labs today; refills were updated at last OV; continue with specialists as scheduled; follow up here in 1 year, sooner prn.   This visit occurred during the SARS-CoV-2 public health emergency.  Safety protocols were in place, including screening questions prior to the visit, additional usage of staff PPE, and extensive cleaning of exam room while observing appropriate contact time as indicated for disinfecting solutions.    Return in about 1 year (around 01/06/2022).  Orders Placed This Encounter  Procedures   CBC with Differential/Platelet   Comp Met (CMET)   Lipid panel   TSH    Requested Prescriptions    No prescriptions requested or ordered in this encounter

## 2021-01-06 NOTE — Telephone Encounter (Signed)
-----   Message from Georgeanna Lea, MD sent at 01/05/2021  1:55 PM EDT ----- Echocardiogram showed preserved left ventricle ejection fraction overall looks good

## 2021-01-06 NOTE — Addendum Note (Signed)
Addended by: Marny Lowenstein on: 01/06/2021 09:34 AM   Modules accepted: Orders

## 2021-01-06 NOTE — Telephone Encounter (Signed)
Patient notified of results.

## 2021-01-10 ENCOUNTER — Other Ambulatory Visit: Payer: Self-pay | Admitting: Family

## 2021-01-10 DIAGNOSIS — E119 Type 2 diabetes mellitus without complications: Secondary | ICD-10-CM

## 2021-01-10 MED ORDER — METFORMIN HCL ER 500 MG PO TB24: ORAL_TABLET | ORAL | 1 refills | Status: DC

## 2021-01-11 NOTE — Telephone Encounter (Signed)
Pt is unsure if she wants to take this medication.

## 2021-01-17 ENCOUNTER — Ambulatory Visit (HOSPITAL_BASED_OUTPATIENT_CLINIC_OR_DEPARTMENT_OTHER): Payer: Medicare Other

## 2021-01-19 NOTE — Addendum Note (Signed)
Addended by: Melvyn Novas on: 01/19/2021 05:16 PM   Modules accepted: Orders

## 2021-01-24 ENCOUNTER — Telehealth: Payer: Self-pay | Admitting: Neurology

## 2021-01-24 NOTE — Telephone Encounter (Signed)
I called pt. I advised pt that Dr. Vickey Huger reviewed their sleep study results and found that pt has sleep apnea. Dr. Vickey Huger recommends that pt starts auto CPAP. I reviewed PAP compliance expectations with the pt. Pt is agreeable to starting a CPAP. I advised pt that an order will be sent to a DME, [facility] and [facility] will call the pt within about one week after they file with the pt's insurance. [facility]) will show the pt how to use the machine, fit for masks, and troubleshoot the CPAP if needed. A follow up appt was made for insurance purposes with Butch Penny, NP on 05/04/2021 at 10 am. Pt verbalized understanding to arrive 15 minutes early and bring their CPAP. A letter with all of this information in it will be mailed to the pt as a reminder. I verified with the pt that the address we have on file is correct. Pt verbalized understanding of results. Pt had no questions at this time but was encouraged to call back if questions arise. I have sent the order to [facility]) and have received confirmation that they have received the order.

## 2021-01-24 NOTE — Telephone Encounter (Signed)
-----   Message from Melvyn Novas, MD sent at 01/19/2021  5:16 PM EDT ----- IMPRESSION: This HST documented persistent severe OSA (obstructive sleep apnea) at AHI of 42.5/h. with Snoring and without hypoxia.  RECOMMENDATION: Autotitration CPAP device with a setting from 5-17 cm water pressure, heated humidification and nasal mask of choice.   INTERPRETING PHYSICIAN: Melvyn Novas, MD

## 2021-01-27 ENCOUNTER — Ambulatory Visit (HOSPITAL_BASED_OUTPATIENT_CLINIC_OR_DEPARTMENT_OTHER)
Admission: RE | Admit: 2021-01-27 | Discharge: 2021-01-27 | Disposition: A | Discharge status: Home / Self Care | Payer: Medicare Other | Source: Ambulatory Visit | Attending: Medical | Admitting: Medical

## 2021-01-27 ENCOUNTER — Other Ambulatory Visit: Payer: Self-pay

## 2021-01-27 DIAGNOSIS — N840 Polyp of corpus uteri: Secondary | ICD-10-CM | POA: Diagnosis present

## 2021-01-28 ENCOUNTER — Other Ambulatory Visit: Payer: Self-pay | Admitting: Family

## 2021-01-30 ENCOUNTER — Encounter: Payer: Self-pay | Admitting: Obstetrics & Gynecology

## 2021-01-30 ENCOUNTER — Ambulatory Visit (INDEPENDENT_AMBULATORY_CARE_PROVIDER_SITE_OTHER): Payer: Medicare Other | Admitting: Obstetrics & Gynecology

## 2021-01-30 ENCOUNTER — Other Ambulatory Visit (HOSPITAL_COMMUNITY)
Admission: RE | Admit: 2021-01-30 | Discharge: 2021-01-30 | Disposition: A | Discharge status: Home / Self Care | Payer: Medicare Other | Source: Ambulatory Visit | Attending: Obstetrics & Gynecology | Admitting: Obstetrics & Gynecology

## 2021-01-30 ENCOUNTER — Other Ambulatory Visit: Payer: Self-pay

## 2021-01-30 VITALS — BP 139/74 | HR 72 | Wt 270.0 lb

## 2021-01-30 DIAGNOSIS — N841 Polyp of cervix uteri: Secondary | ICD-10-CM | POA: Diagnosis not present

## 2021-01-30 DIAGNOSIS — N950 Postmenopausal bleeding: Secondary | ICD-10-CM

## 2021-01-30 DIAGNOSIS — N898 Other specified noninflammatory disorders of vagina: Secondary | ICD-10-CM

## 2021-01-30 DIAGNOSIS — L292 Pruritus vulvae: Secondary | ICD-10-CM | POA: Diagnosis not present

## 2021-01-30 MED ORDER — TRIAMCINOLONE ACETONIDE 0.1 % EX CREA: 1.0000 "application " | TOPICAL_CREAM | Freq: Every day | CUTANEOUS | 0 refills | Status: DC

## 2021-01-30 NOTE — Progress Notes (Signed)
History:  70 y.o. G0P0000 here today for f/u of PMP bleeding. Pt also reports vulvar itching that imporved with the kenalog cream.   The following portions of the patient's history were reviewed and updated as appropriate: allergies, current medications, past family history, past medical history, past social history, past surgical history and problem list.  Review of Systems:  Pertinent items are noted in HPI.    Objective:  Physical Exam Blood pressure 139/74, pulse 72, weight 270 lb (122.5 kg). Gen: NAD Abd: Soft, nontender and nondistended Pelvic: Normal appearing external genitalia; normal appearing vaginal mucosa and cervix.  Normal discharge.  Small uterus, no other palpable masses, no uterine or adnexal tenderness  The indications for endometrial biopsy were reviewed.   Risks of the biopsy including cramping, bleeding, infection, uterine perforation, inadequate specimen and need for additional procedures  were discussed. The patient states she understands and agrees to undergo procedure today. Consent was signed. Time out was performed. Urine HCG was negative. A sterile speculum was placed in the patient's vagina and the cervix was prepped with Betadine. A large cervical polyp was noted. This was removed using the Ringed forceps. It was twisted off at the base. No excessive bleeding was noted.  At this point, a single-toothed tenaculum was placed on the anterior lip of the cervix to stabilize it. The 3 mm pipelle was introduced into the endometrial cavity without difficulty to a depth of 8cm, and a moderate amount of tissue was obtained and sent to pathology. The instruments were removed from the patient's vagina. Minimal bleeding from the cervix was noted. The patient tolerated the procedure well.   Assessment & Plan:  Diagnoses and all orders for this visit:  Post-menopausal bleeding -     Surgical pathology  Cervical polyp -     Surgical pathology  Vulvar itching  Vaginal  itching -     triamcinolone cream (KENALOG) 0.1 %; Apply 1 application topically daily.   Routine post-procedure instructions were given to the patient. The patient will follow up to review the results and for further management.   I suspect the large polyp was the cause of the bleeding. If sx persist, pt to follow up sooner prn   Sophronia Varney L. Harraway-Smith, M.D., [facility]

## 2021-01-31 ENCOUNTER — Encounter: Payer: Self-pay | Admitting: General Practice

## 2021-01-31 LAB — SURGICAL PATHOLOGY

## 2021-02-01 ENCOUNTER — Other Ambulatory Visit: Payer: Self-pay | Admitting: Family

## 2021-02-15 ENCOUNTER — Ambulatory Visit: Payer: Federal, State, Local not specified - PPO | Admitting: Cardiology

## 2021-02-24 ENCOUNTER — Other Ambulatory Visit: Payer: Self-pay | Admitting: Family

## 2021-02-24 ENCOUNTER — Ambulatory Visit (HOSPITAL_BASED_OUTPATIENT_CLINIC_OR_DEPARTMENT_OTHER)
Admission: RE | Admit: 2021-02-24 | Discharge: 2021-02-24 | Disposition: A | Discharge status: Home / Self Care | Payer: Medicare Other | Source: Ambulatory Visit | Attending: Family | Admitting: Family

## 2021-02-24 ENCOUNTER — Encounter: Payer: Self-pay | Admitting: Family

## 2021-02-24 ENCOUNTER — Ambulatory Visit (INDEPENDENT_AMBULATORY_CARE_PROVIDER_SITE_OTHER): Payer: Medicare Other | Admitting: Family

## 2021-02-24 ENCOUNTER — Other Ambulatory Visit: Payer: Self-pay

## 2021-02-24 VITALS — BP 130/78 | HR 80 | Temp 98.0°F | Ht 67.0 in | Wt 268.0 lb

## 2021-02-24 DIAGNOSIS — R300 Dysuria: Secondary | ICD-10-CM

## 2021-02-24 DIAGNOSIS — R109 Unspecified abdominal pain: Secondary | ICD-10-CM

## 2021-02-24 DIAGNOSIS — M5442 Lumbago with sciatica, left side: Secondary | ICD-10-CM | POA: Diagnosis present

## 2021-02-24 DIAGNOSIS — Z87440 Personal history of urinary (tract) infections: Secondary | ICD-10-CM

## 2021-02-24 DIAGNOSIS — R7309 Other abnormal glucose: Secondary | ICD-10-CM | POA: Diagnosis not present

## 2021-02-24 LAB — CBC WITH DIFFERENTIAL/PLATELET
Basophils Absolute: 0.1 10*3/uL (ref 0.0–0.1)
Basophils Relative: 1.1 % (ref 0.0–3.0)
Eosinophils Absolute: 0.2 10*3/uL (ref 0.0–0.7)
Eosinophils Relative: 2.4 % (ref 0.0–5.0)
HCT: 41.8 % (ref 36.0–46.0)
Hemoglobin: 13.9 g/dL (ref 12.0–15.0)
Lymphocytes Relative: 32.1 % (ref 12.0–46.0)
Lymphs Abs: 2 10*3/uL (ref 0.7–4.0)
MCHC: 33.3 g/dL (ref 30.0–36.0)
MCV: 85.9 fl (ref 78.0–100.0)
Monocytes Absolute: 0.4 10*3/uL (ref 0.1–1.0)
Monocytes Relative: 7 % (ref 3.0–12.0)
Neutro Abs: 3.6 10*3/uL (ref 1.4–7.7)
Neutrophils Relative %: 57.4 % (ref 43.0–77.0)
Platelets: 323 10*3/uL (ref 150.0–400.0)
RBC: 4.86 Mil/uL (ref 3.87–5.11)
RDW: 13.2 % (ref 11.5–15.5)
WBC: 6.2 10*3/uL (ref 4.0–10.5)

## 2021-02-24 LAB — URINALYSIS, ROUTINE W REFLEX MICROSCOPIC
Bilirubin Urine: NEGATIVE
Hgb urine dipstick: NEGATIVE
Ketones, ur: NEGATIVE
Nitrite: NEGATIVE
Specific Gravity, Urine: 1.025 (ref 1.000–1.030)
Total Protein, Urine: NEGATIVE
Urine Glucose: NEGATIVE
Urobilinogen, UA: 0.2 (ref 0.0–1.0)
pH: 6 (ref 5.0–8.0)

## 2021-02-24 LAB — HEMOGLOBIN A1C: Hgb A1c MFr Bld: 7.9 % — ABNORMAL HIGH (ref 4.6–6.5)

## 2021-02-24 LAB — COMPREHENSIVE METABOLIC PANEL
ALT: 23 U/L (ref 0–35)
AST: 18 U/L (ref 0–37)
Albumin: 4.2 g/dL (ref 3.5–5.2)
Alkaline Phosphatase: 68 U/L (ref 39–117)
BUN: 18 mg/dL (ref 6–23)
CO2: 28 mEq/L (ref 19–32)
Calcium: 9.7 mg/dL (ref 8.4–10.5)
Chloride: 103 mEq/L (ref 96–112)
Creatinine, Ser: 1.13 mg/dL (ref 0.40–1.20)
GFR: 49.45 mL/min — ABNORMAL LOW (ref >60.00)
Glucose, Bld: 162 mg/dL — ABNORMAL HIGH (ref 70–99)
Potassium: 4.6 mEq/L (ref 3.5–5.1)
Sodium: 140 mEq/L (ref 135–145)
Total Bilirubin: 0.6 mg/dL (ref 0.2–1.2)
Total Protein: 7 g/dL (ref 6.0–8.3)

## 2021-02-24 MED ORDER — METHOCARBAMOL 500 MG PO TABS: 500.0000 mg | ORAL_TABLET | Freq: Three times a day (TID) | ORAL | 0 refills | Status: DC | PRN

## 2021-02-24 MED ORDER — MELOXICAM 15 MG PO TABS: 15.0000 mg | ORAL_TABLET | Freq: Every day | ORAL | 0 refills | Status: DC

## 2021-02-24 NOTE — Progress Notes (Signed)
Tricia Dodson is a 70 y.o. 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 90 tablet 1   atorvastatin (LIPITOR) 10 MG tablet Take 1 tablet (10 mg total) by mouth daily. 90 tablet 3   fenofibrate micronized (LOFIBRA) 134 MG capsule Take 1 capsule (134 mg total) by mouth daily before breakfast. 90 capsule 1   gabapentin (NEURONTIN) 300 MG capsule TAKE 1 CAPSULE(300 MG) BY MOUTH TWICE DAILY 180 capsule 0   levocetirizine (XYZAL) 5 MG tablet Take 1 tablet (5 mg total) by mouth daily. 90 tablet 3   levothyroxine (SYNTHROID) 25 MCG tablet Take 1 tablet (25 mcg total) by mouth daily. 90 tablet 1   losartan (COZAAR) 50 MG tablet Take 1 tablet (50 mg total) by mouth daily. (Patient taking differently: Take 50 mg by mouth daily.) 90 tablet 1   methocarbamol (ROBAXIN) 500 MG tablet Take 1 tablet (500 mg total) by mouth every 8 (eight) hours as needed. 30 tablet 0   metroNIDAZOLE (FLAGYL) 500 MG tablet Take 1 tablet (500 mg total) by mouth 2 (two) times daily. 14 tablet 0   omeprazole (PRILOSEC) 20 MG capsule Take 1 capsule (20 mg total) by  mouth daily. (Patient taking differently: Take 20 mg by mouth daily.) 90 capsule 1   triamcinolone cream (KENALOG) 0.1 % Apply 1 application topically daily. 60 g 0   meloxicam (MOBIC) 15 MG tablet TAKE 1 TABLET(15 MG) BY MOUTH DAILY 90 tablet 0   metFORMIN (GLUCOPHAGE-XR) 500 MG 24 hr tablet Take 1 tablet daily x 7 days; then increase to bid as directed; (Patient not taking: No sig reported) 60 tablet 1   No current facility-administered medications for this visit.    Allergies: Patient has no known allergies.  Past Medical History:  Diagnosis Date   Allergy    Arthritis    Carpal tunnel syndrome, bilateral 11/04/2020   Both wrists.    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   Shingles 2019   Thyroid disease    Torn ligament 11/04/2020   Right knee   UTI (urinary tract infection)     Past Surgical History:  Procedure Laterality Date   BILATERAL CARPAL TUNNEL RELEASE     Hemorroids removed     KNEE ARTHROSCOPY Right    Plantar fasciatis      Repaired   right hand cyst removal     vein vascular     surgery    Family History  Problem Relation Age of Onset   Arthritis Mother    Diabetes Father    Heart disease Father    Arthritis Sister    Diabetes Sister    Hyperlipidemia Sister    Hyperlipidemia Brother    Heart disease Brother        triple heart bypass    Social History   Tobacco Use   Smoking status: Former   Smokeless tobacco: Never  Substance Use Topics   Alcohol use: Never    Subjective:  Patient drove to [place] earlier this month; notes she was in the car for 4 days out of 7 days;   Once getting back, she has been experiencing left sided low back pain that radiates into her left lower leg- no prior history of sciatica; Has had some vague abdominal pain/ nausea since returning from her trip; no nausea or vomiting; does feel that abdominal pain is improving; no history of diverticulosis or diverticulitis;   Would also like to re-check labs regarding diabetes diagnosis; she opted against starting Metformin in July and would like to review labs;      Objective:  Vitals:   02/24/21 0951  BP: 130/78  Pulse: 80  Temp: 98 F (36.7 C)  TempSrc: Oral  SpO2: 99%  Weight: 268 lb (121.6 kg)  Height: 5' 7"  (1.702 m)    General: Well developed, well nourished, in no acute distress  Skin : Warm and dry.  Head: Normocephalic and atraumatic  Lungs: Respirations unlabored; clear to auscultation bilaterally without wheeze, rales, rhonchi  CVS exam: normal rate and regular rhythm.  Abdomen: Soft; nontender; nondistended; normoactive bowel sounds; no masses or hepatosplenomegaly  Musculoskeletal: No deformities; no active joint inflammation  Extremities: No edema, cyanosis, clubbing  Vessels: Symmetric bilaterally  Neurologic: Alert and oriented; speech intact; face symmetrical; moves all extremities well; CNII-XII intact without focal deficit  Assessment:  1. Abdominal pain, unspecified abdominal location   2. History of UTI   3. Dysuria   4. Acute left-sided low back pain with left-sided sciatica   5. Elevated glucose     Plan:  Check CBC, CMP, U/A and urine culture; ? If she developed UTI due to extended time sitting in the car on recent trip; follow up to be determined; Update lumbar X-ray; may need MRI and/or PT; trial of Mobic and Robaxin; Repeat Hgba1c; reiterated that this test is an average of her blood sugar over 3 months and would not be affected if she was not fasting this morning. Follow up to be determined;  This visit occurred during the SARS-CoV-2 public  health emergency.  Safety protocols were in place, including screening questions prior to the visit, additional usage of staff PPE, and extensive cleaning of exam room while observing appropriate contact time as indicated for disinfecting solutions.    No follow-ups on file.  Orders Placed This Encounter  Procedures   Urine Culture   DG Lumbar Spine Complete    Standing Status:   Future    Number of Occurrences:   1    Standing Expiration Date:   02/24/2022    Order Specific Question:   Reason for Exam (SYMPTOM  OR DIAGNOSIS REQUIRED)    Answer:   low back pain with radiculopathy    Order Specific Question:   Preferred imaging location?    Answer:   [facility]   CBC with Differential/Platelet   Comp Met (CMET)   Urinalysis   Hemoglobin A1c    Requested Prescriptions   Signed Prescriptions Disp Refills   methocarbamol (ROBAXIN) 500 MG tablet 30 tablet 0    Sig: Take 1 tablet (500 mg total) by mouth every 8 (eight) hours as needed.

## 2021-02-26 LAB — URINE CULTURE
MICRO NUMBER:: 12266373
SPECIMEN QUALITY:: ADEQUATE

## 2021-02-28 ENCOUNTER — Other Ambulatory Visit: Payer: Self-pay | Admitting: Family

## 2021-02-28 DIAGNOSIS — R110 Nausea: Secondary | ICD-10-CM

## 2021-02-28 DIAGNOSIS — K8020 Calculus of gallbladder without cholecystitis without obstruction: Secondary | ICD-10-CM

## 2021-03-07 ENCOUNTER — Encounter: Payer: Self-pay | Admitting: Family

## 2021-03-07 ENCOUNTER — Other Ambulatory Visit: Payer: Self-pay

## 2021-03-07 ENCOUNTER — Ambulatory Visit (INDEPENDENT_AMBULATORY_CARE_PROVIDER_SITE_OTHER): Payer: Medicare Other | Admitting: Family

## 2021-03-07 VITALS — BP 150/80 | HR 66 | Temp 98.5°F | Ht 67.0 in | Wt 271.2 lb

## 2021-03-07 DIAGNOSIS — N644 Mastodynia: Secondary | ICD-10-CM | POA: Diagnosis not present

## 2021-03-07 DIAGNOSIS — R0789 Other chest pain: Secondary | ICD-10-CM

## 2021-03-07 DIAGNOSIS — M5442 Lumbago with sciatica, left side: Secondary | ICD-10-CM

## 2021-03-07 MED ORDER — CEPHALEXIN 500 MG PO CAPS: 500.0000 mg | ORAL_CAPSULE | Freq: Three times a day (TID) | ORAL | 0 refills | Status: DC

## 2021-03-07 NOTE — Progress Notes (Signed)
Tricia Dodson is a 70 y.o. 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 90 tablet 1   atorvastatin (LIPITOR) 10 MG tablet Take 1 tablet (10 mg total) by mouth daily. 90 tablet 3   cephALEXin (KEFLEX) 500 MG capsule Take 1 capsule (500 mg total) by mouth 3 (three) times daily. 21 capsule 0   fenofibrate micronized (LOFIBRA) 134 MG capsule Take 1 capsule (134 mg total) by mouth daily before breakfast. 90 capsule 1   gabapentin (NEURONTIN) 300 MG capsule TAKE 1 CAPSULE(300 MG) BY MOUTH TWICE DAILY 180 capsule 0   levocetirizine (XYZAL) 5 MG tablet Take 1 tablet (5 mg total) by mouth daily. 90 tablet 3   levothyroxine (SYNTHROID) 25 MCG tablet Take 1 tablet (25 mcg total) by mouth daily. 90 tablet 1   losartan (COZAAR) 50 MG tablet Take 1 tablet (50 mg total) by mouth daily. (Patient taking differently: Take 50 mg by mouth daily.) 90 tablet 1   meloxicam (MOBIC) 15 MG tablet TAKE 1 TABLET(15 MG) BY MOUTH DAILY 90 tablet 0   metFORMIN (GLUCOPHAGE-XR) 500 MG 24 hr tablet Take 1 tablet daily x 7 days; then increase to bid as directed;  60 tablet 1   methocarbamol (ROBAXIN) 500 MG tablet Take 1 tablet (500 mg total) by mouth every 8 (eight) hours as needed. 30 tablet 0   omeprazole (PRILOSEC) 20 MG capsule Take 1 capsule (20 mg total) by mouth daily. (Patient taking differently: Take 20 mg by mouth daily.) 90 capsule 1   triamcinolone cream (KENALOG) 0.1 % Apply 1 application topically daily. 60 g 0   No current facility-administered medications for this visit.    Allergies: Patient has no known allergies.  Past Medical History:  Diagnosis Date   Allergy    Arthritis    Carpal tunnel syndrome, bilateral 11/04/2020   Both wrists.    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   Shingles 2019   Thyroid disease    Torn ligament 11/04/2020   Right knee   UTI (urinary tract infection)     Past Surgical History:  Procedure Laterality Date  BILATERAL CARPAL TUNNEL RELEASE     Hemorroids removed     KNEE ARTHROSCOPY Right    Plantar fasciatis      Repaired   right hand cyst removal     vein vascular     surgery    Family History  Problem Relation Age of Onset   Arthritis Mother    Diabetes Father    Heart disease Father    Arthritis Sister    Diabetes Sister    Hyperlipidemia Sister    Hyperlipidemia Brother    Heart disease Brother        triple heart bypass    Social History   Tobacco Use   Smoking status: Former   Smokeless tobacco: Never  Substance Use Topics   Alcohol use: Never    Subjective:   Requesting referral to PT to help with left sided low back pain; limited benefit with home stretching/ Mobic;   Also concerned  about left sided chest pain/ left sided breast pain x 1 week; does have history of fibrocystic breast changes but is concerned that she may have noticed a lump in the lower part of the left breast; notes that her breast in this area is uncomfortable and she experiences sharp pain when the actual area is touched; normal mammogram done earlier this summer.      Objective:  Vitals:   03/07/21 0947  BP: (!) 150/80  Pulse: 66  Temp: 98.5 F (36.9 C)  TempSrc: Oral  SpO2: 99%  Weight: 271 lb 3.2 oz (123 kg)  Height: 5\' 7"  (1.702 m)    General: Well developed, well nourished, in no acute distress  Skin : Warm and dry.  Head: Normocephalic and atraumatic  Lungs: Respirations unlabored; clear to auscultation bilaterally without wheeze, rales, rhonchi  CVS exam: normal rate and regular rhythm.  Musculoskeletal: No deformities; no active joint inflammation  Extremities: No edema, cyanosis, clubbing  Vessels: Symmetric bilaterally  Neurologic: Alert and oriented; speech intact; face symmetrical; moves all extremities well; CNII-XII intact without focal deficit  Breast exam- single lump noted beneath left breast at 6:00 position  Assessment:  1. Atypical chest pain   2. Breast pain, left   3. Left-sided low back pain with left-sided sciatica, unspecified chronicity     Plan:  EKG done due to history of cardiac but suspect pain is referred pain from breast; EKG shows sinus rhythm; will update diagnostic mammogram of left breast; Rx for Keflex to treat for suspected infection; follow up to be determined;  Refer to PT as requested; follow up to be determined;   No follow-ups on file.  Orders Placed This Encounter  Procedures   MM Digital Diagnostic Unilat L    Standing Status:   Future    Standing Expiration Date:   03/07/2022    Order Specific Question:   Reason for Exam (SYMPTOM  OR DIAGNOSIS REQUIRED)    Answer:   left breast pain/ painful cystic area at 6:00 position    Order  Specific Question:   [facility]    [facility]   Ambulatory referral to Physical Therapy    Referral Priority:   Routine    Referral Type:   Physical Medicine    Referral Reason:   Specialty Services Required    Requested Specialty:   Physical Therapy    Number of Visits Requested:   1   EKG 12-Lead    Requested Prescriptions   Signed Prescriptions Disp Refills  cephALEXin (KEFLEX) 500 MG capsule 21 capsule 0    Sig: Take 1 capsule (500 mg total) by mouth 3 (three) times daily.

## 2021-03-14 ENCOUNTER — Other Ambulatory Visit: Payer: Self-pay

## 2021-03-14 ENCOUNTER — Encounter: Payer: Self-pay | Admitting: Rehabilitative and Restorative Service Providers"

## 2021-03-14 ENCOUNTER — Ambulatory Visit: Payer: Federal, State, Local not specified - PPO | Admitting: Family

## 2021-03-14 ENCOUNTER — Ambulatory Visit: Payer: Medicare Other | Attending: Family | Admitting: Rehabilitative and Restorative Service Providers"

## 2021-03-14 DIAGNOSIS — M5442 Lumbago with sciatica, left side: Secondary | ICD-10-CM | POA: Diagnosis present

## 2021-03-14 DIAGNOSIS — R262 Difficulty in walking, not elsewhere classified: Secondary | ICD-10-CM | POA: Insufficient documentation

## 2021-03-14 DIAGNOSIS — R252 Cramp and spasm: Secondary | ICD-10-CM | POA: Diagnosis present

## 2021-03-14 DIAGNOSIS — M6281 Muscle weakness (generalized): Secondary | ICD-10-CM

## 2021-03-14 NOTE — Patient Instructions (Signed)
Access Code: [email or web address] URL: [email or web address] Date: 03/14/2021 Prepared by: Clydie Braun Cynthia Stainback  Exercises Supine Hamstring Stretch - 2 x daily - 7 x weekly - 1 sets - 3 reps - 20 sec hold Supine Figure 4 Piriformis Stretch - 2 x daily - 7 x weekly - 1 sets - 3 reps - 20 sec hold Supine Bridge - 2 x daily - 7 x weekly - 2 sets - 10 reps - 2 sec hold Supine Posterior Pelvic Tilt - 2 x daily - 7 x weekly - 2 sets - 10 reps Dry Needling handout

## 2021-03-14 NOTE — Therapy (Signed)
[facility] [address] [place], [place], [address] Phone: [phone]   Fax:  [phone]  Physical Therapy Evaluation  Patient Details  Name: Tricia Dodson MRN: [id number] Date of Birth: 12-03-1950 Referring Provider (PT): Tricia Bass, FNP   Encounter Date: 03/14/2021   PT End of Session - 03/14/21 1011     Visit Number 1    Date for PT Re-Evaluation 05/05/21    PT Start Time 0930    PT Stop Time 1010    PT Time Calculation (min) 40 min    Activity Tolerance Patient tolerated treatment well    Behavior During Therapy [facility] for tasks assessed/performed             Past Medical History:  Diagnosis Date   Allergy    Arthritis    Carpal tunnel syndrome, bilateral 11/04/2020   Both wrists.    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    Subjective Assessment - 03/14/21 0933     Subjective Pt believes back pain was triggered when she drove to [place] a month ago and was driving increased distance and not taking enough breaks in driving to stretch.  Pt has tried some home stretching and pool stretches, but the pain did not improve.    Pertinent  History HTN, L knee torn meniscus    Limitations Walking;Standing    How long can you sit comfortably? Depends the seating surface, but her reclincer works best    How long can you stand comfortably? about 10-15 min    How long can you walk comfortably? 100 yards    Patient Stated Goals I want to be able to go out walking again and go on outings with community group.    Currently in Pain? Yes    Pain Score 6     Pain Location Back    Pain Orientation Lower;Left    Pain Descriptors / Indicators Radiating;Throbbing;Shooting    Pain Type Acute pain    Pain Radiating Towards down LLE    Pain Onset 1 to 4 weeks ago    Pain Frequency Constant    Aggravating Factors  standing and walking    Pain Relieving Factors pain medication                [facility] PT Assessment - 03/14/21 0001       Assessment   Medical Diagnosis LBP with sciatica    Referring Provider (PT) Tricia Bass, FNP    Hand Dominance  Right    Next MD Visit as needed    Prior Therapy not for her back      Precautions   Precautions None      Restrictions   Weight Bearing Restrictions No      Balance Screen   Has the patient fallen in the past 6 months No    Has the patient had a decrease in activity level because of a fear of falling?  No    Is the patient reluctant to leave their home because of a fear of falling?  No      Home Environment   Living Environment Private residence    Living Arrangements Alone    Type of Home House    Home Access Level entry    Home Layout Two level;Bed/bath upstairs    Alternate Level Stairs-Number of Steps 18    Alternate Level Stairs-Rails Right;Left    Home Equipment None      Prior Function   Level of Independence Independent    Vocation Retired    Leisure Partipates in a community group for activities      Cognition   Overall Cognitive Status Within Functional Limits for tasks assessed      Observation/Other Assessments   Focus on Therapeutic Outcomes  (FOTO)  52%      ROM / Strength   AROM / PROM / Strength Strength      Strength   Overall Strength Deficits    Overall Strength Comments LLE strength of 4/5 grossly throughout.  Otherwise, WFL      Transfers   Five time sit to stand comments  21.4 sec without UE                        Objective measurements completed on examination: See above findings.               PT Education - 03/14/21 1005     Education Details Pt provided with HEP    Person(s) Educated Patient    Methods Explanation;Demonstration;Handout    Comprehension Verbalized understanding;Returned demonstration              PT Short Term Goals - 03/14/21 1256       PT SHORT TERM GOAL #1   Title Pt will be independent with initial HEP.    Time 2    Period Weeks    Status New               PT Long Term Goals - 03/14/21 1256       PT LONG TERM GOAL #1   Title Pt will be independent with advanced HEP.    Time 8    Period Weeks    Status New      PT LONG TERM GOAL #2   Title Pt will increase FOTO to 65%.    Time 8    Period Weeks    Status New      PT LONG TERM GOAL #3   Title Pt will report being able to walk for at least 30 minutes without increased pain to allow her to go out in the community with her group.    Time 8    Period Weeks    Status New      PT LONG TERM GOAL #4   Title Pt will demonstrate good posture and body mechanics during functional activities.    Time 8    Period Weeks  Status New                    Plan - 03/14/21 1013     Clinical Impression Statement Tricia Dodson is a 70 y.o. female who presents to outpatient PT with a referral from Tricia Bass, FNP secondary to LBP with sciatica.  Pt primarily having sciatica down LLE.  Her PLOF was able to participate in a community group that does various actvities, such as hiking.  She was in her usual health until a 12 hour drive to [place] and back in a matter of a few days  where she admits that she did not stop as often as she should have to get out of the car and stretch.  She presents with increased pain, decreased LLE strength, decreased flexibility, and difficulty walking.  Tricia Dodson would benefit from skilled PT to address her functional impairments to allow her to be safer and more indepedent and return to her community activity group.    Personal Factors and Comorbidities Age;Comorbidity 2    Comorbidities HTN, L knee meniscus tear    Examination-Activity Limitations Locomotion Level;Stairs    Examination-Participation Restrictions Community Activity    Stability/Clinical Decision Making Evolving/Moderate complexity    Clinical Decision Making Moderate    Rehab Potential Good    PT Frequency 2x / week    PT Duration 8 weeks    PT Treatment/Interventions ADLs/Self Care Home Management;Cryotherapy;Electrical Stimulation;Iontophoresis 4mg /ml Dexamethasone;Moist Heat;Traction;Ultrasound;Gait training;Stair training;Functional mobility training;Therapeutic activities;Therapeutic exercise;Balance training;Neuromuscular re-education;Patient/family education;Manual techniques;Passive range of motion;Dry needling;Taping;Vasopneumatic Device;Spinal Manipulations;Joint Manipulations    PT Next Visit Plan DN if pt agreeable, strengthening, flexibility, core stability    PT Home Exercise Plan Access Code:    Consulted and Agree with Plan of Care Patient             Patient will benefit from skilled therapeutic intervention in order to improve the following deficits and impairments:  Difficulty walking, Increased muscle spasms, Obesity, Decreased activity tolerance, Pain, Impaired flexibility, Improper body mechanics, Decreased strength  Visit Diagnosis: Acute left-sided low back pain with left-sided sciatica - Plan: PT plan of care cert/re-cert  Muscle weakness (generalized) - Plan: PT plan of care cert/re-cert  Cramp and spasm - Plan: PT plan of care  cert/re-cert  Difficulty in walking, not elsewhere classified - Plan: PT plan of care cert/re-cert     Problem List Patient Active Problem List   Diagnosis Date Noted   Dizziness 12/02/2020   Nonspecific abnormal electrocardiogram (ECG) (EKG) 12/02/2020   UTI (urinary tract infection)    Thyroid disease    Hypertension    Hyperlipidemia    HA (headache)    GERD (gastroesophageal reflux disease)    Colon polyps    Chicken pox    Arthritis    Allergy    Morbid obesity (HCC) 11/30/2020   Retrognathia 11/30/2020   CPAP (continuous positive airway pressure) dependence 11/30/2020   OSA on CPAP 11/30/2020   CPAP use counseling 11/04/2020   Carpal tunnel syndrome, bilateral 11/04/2020   Plantar fasciitis, bilateral 11/04/2020   Torn ligament 11/04/2020   History of vein stripping 11/04/2020   Shingles 2019    11/06/2020, PT, DPT 03/14/2021, 1:02 PM  [facility] [address] [place], [place], [place] Phone: [phone]   Fax:  [phone]  Name: Tricia Dodson MRN: Carlyn Reichert Date of Birth: [birth year]

## 2021-03-16 ENCOUNTER — Other Ambulatory Visit: Payer: Self-pay | Admitting: Family

## 2021-03-16 DIAGNOSIS — N644 Mastodynia: Secondary | ICD-10-CM

## 2021-03-17 ENCOUNTER — Encounter: Payer: Self-pay | Admitting: Physical Therapy

## 2021-03-17 ENCOUNTER — Ambulatory Visit: Payer: Medicare Other | Admitting: Physical Therapy

## 2021-03-17 ENCOUNTER — Other Ambulatory Visit: Payer: Self-pay

## 2021-03-17 DIAGNOSIS — R252 Cramp and spasm: Secondary | ICD-10-CM

## 2021-03-17 DIAGNOSIS — M6281 Muscle weakness (generalized): Secondary | ICD-10-CM

## 2021-03-17 DIAGNOSIS — R262 Difficulty in walking, not elsewhere classified: Secondary | ICD-10-CM

## 2021-03-17 DIAGNOSIS — M5442 Lumbago with sciatica, left side: Secondary | ICD-10-CM

## 2021-03-17 NOTE — Therapy (Signed)
[place] [place], [place], [address] Phone: [phone]   Fax:  [phone]  Physical Therapy Treatment  Patient Details  Name: Tricia Dodson MRN: [id number] Date of Birth: 21-Apr-1951 Referring Provider (PT): Marrian Salvage, FNP   Encounter Date: 03/17/2021   PT End of Session - 03/17/21 1013     Visit Number 2    Date for PT Re-Evaluation 05/05/21    PT Start Time 0929    PT Stop Time 3846    PT Time Calculation (min) 45 min    Activity Tolerance Patient tolerated treatment well    Behavior During Therapy [facility] for tasks assessed/performed             Past Medical History:  Diagnosis Date   Allergy    Arthritis    Carpal tunnel syndrome, bilateral 11/04/2020   Both wrists.    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   Subjective Assessment - 03/17/21 0931     Subjective I am doing okay, pain across back and the left hip    Currently in Pain? Yes    Pain Score 2     Pain Location Back    Pain Orientation Lower    Aggravating Factors  walking pain up to 6/10                               [facility] Adult PT  Treatment/Exercise - 03/17/21 0001       Exercises   Exercises Lumbar      Lumbar Exercises: Stretches   Passive Hamstring Stretch Right;Left;5 reps;20 seconds    Piriformis Stretch Right;Left;5 reps;20 seconds      Lumbar Exercises: Aerobic   Nustep level 5 x 5 minutes      Lumbar Exercises: Machines for Strengthening   Other Lumbar Machine Exercise seated row 15#, lats 20# 2x10    Other Lumbar Machine Exercise 5# straight arm extension 2x10, 10# AR press x10 each      Lumbar Exercises: Supine   Other Supine Lumbar Exercises feet on ball K2C, trunk rotation, small bridges, isometric abs    Other Supine Lumbar Exercises ball b/n knees squeeze, red tband clamshells                       PT Short Term Goals - 03/17/21 1020       PT SHORT TERM GOAL #1   Title  Pt will be independent with initial HEP.    Status Partially Met               PT Long Term Goals - 03/14/21 1256       PT LONG TERM GOAL #1   Title Pt will be independent with advanced HEP.    Time 8    Period Weeks    Status New      PT LONG TERM GOAL #2   Title Pt will increase FOTO to 65%.    Time 8    Period Weeks    Status New      PT LONG TERM GOAL #3   Title Pt will report being able to walk for at least 30 minutes without increased pain to allow her to go out in the community with her group.    Time 8    Period Weeks    Status New      PT LONG TERM GOAL #4   Title Pt will demonstrate good posture and body mechanics during functional activities.    Time 8    Period Weeks    Status New                   Plan - 03/17/21 1014     Clinical Impression Statement I started passive stretches and stability exercises, bridges cause increase of LBP, she is tight and needs better flexibility, once we got in the gym she did well with some cues no increase of pain, did c/o right groin pain but reports that is from shingles issue in the past    PT Next Visit Plan DN if pt  agreeable, strengthening, flexibility, core stability    Consulted and Agree with Plan of Care Patient             Patient will benefit from skilled therapeutic intervention in order to improve the following deficits and impairments:  Difficulty walking, Increased muscle spasms, Obesity, Decreased activity tolerance, Pain, Impaired flexibility, Improper body mechanics, Decreased strength  Visit Diagnosis: Acute left-sided low back pain with left-sided sciatica  Muscle weakness (generalized)  Cramp and spasm  Difficulty in walking, not elsewhere classified     Problem List Patient Active Problem List   Diagnosis Date Noted   Dizziness 12/02/2020   Nonspecific abnormal electrocardiogram (ECG) (EKG) 12/02/2020   UTI (urinary tract infection)    Thyroid disease    Hypertension    Hyperlipidemia    HA (headache)    GERD (gastroesophageal reflux disease)    Colon polyps    Chicken pox    Arthritis    Allergy    Morbid obesity ([place]) 11/30/2020   Retrognathia 11/30/2020   CPAP (continuous positive airway pressure) dependence 11/30/2020   OSA on CPAP 11/30/2020   CPAP use counseling 11/04/2020   Carpal tunnel syndrome, bilateral 11/04/2020   Plantar fasciitis, bilateral 11/04/2020   Torn ligament 11/04/2020   History of vein stripping 11/04/2020   Shingles 2019    Honestee Revard W, PT 03/17/2021, 10:21 AM  [place] [place], [place], [address] Phone: [phone]   Fax:  [phone]  Name: Tricia Dodson MRN: [id number] Date of Birth: 23-Nov-1950

## 2021-03-20 ENCOUNTER — Ambulatory Visit (INDEPENDENT_AMBULATORY_CARE_PROVIDER_SITE_OTHER): Payer: Medicare Other | Admitting: Obstetrics & Gynecology

## 2021-03-20 ENCOUNTER — Other Ambulatory Visit: Payer: Self-pay

## 2021-03-20 DIAGNOSIS — N898 Other specified noninflammatory disorders of vagina: Secondary | ICD-10-CM | POA: Diagnosis not present

## 2021-03-20 MED ORDER — TRIAMCINOLONE ACETONIDE 0.1 % EX CREA: 1.0000 "application " | TOPICAL_CREAM | Freq: Every day | CUTANEOUS | 2 refills | Status: DC

## 2021-03-20 NOTE — Progress Notes (Signed)
History:  70 y.o. G0P0000 here today for PMPB and vulvovaginitis. Pt reports a lesions that comes every 2 weeks. It starts as a lump then becomes painful then resolves. She has never been dx'd with genital HSV but, she recently had Shingles. Her rash is improved at present. She cannot see the lesion. She uses the cream for yeast only at night when she has itching.   She denies further bleeding.   The following portions of the patient's history were reviewed and updated as appropriate: allergies, current medications, past family history, past medical history, past social history, past surgical history and problem list.  Review of Systems:  Pertinent items are noted in HPI.    Objective:  Physical Exam Blood pressure (!) 146/89, pulse 73, weight 266 lb (120.7 kg).  CONSTITUTIONAL: Well-developed, well-nourished female in no acute distress.  HENT:  Normocephalic, atraumatic EYES: Conjunctivae and EOM are normal. No scleral icterus.  NECK: Normal range of motion SKIN: Skin is warm and dry. No rash noted. Not diaphoretic.No pallor. NEUROLGIC: Alert and oriented to person, place, and time. Normal coordination.  Pelvic: Normal appearing external genitalia; the skin changes are minor but, there is some erythema c/w mild yeast. There is one area on the right labia majus that could indicate a resolving excoriation but, this is very unclear. Normal discharge.     Assessment & Plan:  Diagnoses and all orders for this visit:  Vaginal itching -     triamcinolone cream (KENALOG) 0.1 %; Apply 1 application topically daily.  Rec using the cream until the rash has completely resolved.   Intermittent Painful lesion   I have recommended that when pt has the lesion, she f/u at that time for eval so that we can adequately dx it. I have discussed with her my concern of HSV although she has no history. Will need to see the lesion.   Total face-to-face time with patient, review of chart, discussion with  consultant and coordination of care was .    Nadiah Corbit L. Harraway-Smith, M.D., [place] [place]

## 2021-03-20 NOTE — Progress Notes (Signed)
Patient hasnt had bleeding since last visit. Armandina Stammer RN

## 2021-03-21 ENCOUNTER — Encounter: Payer: Self-pay | Admitting: Family

## 2021-03-21 ENCOUNTER — Ambulatory Visit: Payer: Medicare Other | Admitting: Physical Therapy

## 2021-03-21 DIAGNOSIS — M6281 Muscle weakness (generalized): Secondary | ICD-10-CM

## 2021-03-21 DIAGNOSIS — M5442 Lumbago with sciatica, left side: Secondary | ICD-10-CM | POA: Diagnosis not present

## 2021-03-21 MED ORDER — METFORMIN HCL ER 500 MG PO TB24
500.0000 mg | ORAL_TABLET | Freq: Two times a day (BID) | ORAL | 0 refills | Status: DC
Start: 2021-03-21 — End: 2021-04-12

## 2021-03-21 NOTE — Therapy (Signed)
[facility] [address] [place], [place], [address] Phone: [phone]   Fax:  [phone]  Physical Therapy Treatment  Patient Details  Name: Tricia Dodson MRN: [id number] Date of Birth: 11/20/1950 Referring Provider (PT): Laura Woodruff Murray, FNP   Encounter Date: 03/21/2021   PT End of Session - 03/21/21 0924     Visit Number 3    Date for PT Re-Evaluation 05/05/21    PT Start Time 0845    PT Stop Time 0935    PT Time Calculation (min) 50 min             Past Medical History:  Diagnosis Date   Allergy    Arthritis    Carpal tunnel syndrome, bilateral 11/04/2020   Both wrists.    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   Subjective Assessment - 03/21/21 0847     Subjective mornings are always rougher, but i usually have the stiffness worked out by now    Currently in Pain? Yes    Pain Score 5     Pain Location Back                               [facility] Adult PT Treatment/Exercise - 03/21/21 0001       Lumbar Exercises: Seated   Other Seated Lumbar Exercises pelvic ROM on dynadisc 10 each way   red tband LE ther ex 10 reps  each- LAQ,hip flex,hip abd, HS curl ,ball squeeze     Lumbar Exercises: Supine   Ab Set 15 reps;3 seconds   with ball   Bridge with Ball Squeeze Compliant;10 reps;3 seconds    Other Supine Lumbar Exercises feet on ball K2C, trunk rotation, small bridges    Other Supine Lumbar Exercises clam shell and marcing with tband with ppt 10 each      Modalities   Modalities Moist Heat      Moist Heat Therapy   Number Minutes Moist Heat 10 Minutes    Moist Heat Location Lumbar Spine   after DN     Manual Therapy   Manual Therapy Passive ROM;Soft tissue mobilization;Myofascial release    Manual therapy comments Left HS and piriformis tightness    Soft tissue mobilization To lumbar paraspinals and L piriformis    Myofascial Release DN to L lumbar multifidi and L parasinal    Passive ROM LE and trunk                Trigger Point Dry Needling - 03/21/21 0001     Consent Given? Yes    Education Handout Provided Yes    Muscles Treated Back/Hip Piriformis;Lumbar multifidi    Piriformis Response Twitch response elicited    Lumbar multifidi Response Twitch response elicited                   PT Education - 03/21/21 0931     Education Details Education about DN    Person(s) Educated Patient    Methods Explanation;Handout    Comprehension Verbalized understanding              PT Short Term Goals - 03/17/21 1020       PT SHORT TERM GOAL #1   Title Pt will be independent with initial HEP.    Status Partially Met               PT Long Term Goals - 03/14/21 1256       PT LONG TERM GOAL #1   Title Pt will be independent with advanced HEP.    Time 8    Period Weeks    Status New      PT LONG TERM GOAL #2   Title Pt will increase FOTO to 65%.    Time 8    Period Weeks    Status New      PT LONG TERM GOAL #3   Title Pt will report being able to walk for at least 30 minutes without increased pain to allow her to go out in the community with her group.    Time  8    Period Weeks    Status New      PT LONG TERM GOAL #4   Title Pt will demonstrate good posture and body mechanics during functional activities.    Time 8    Period Weeks    Status New                   Plan - 03/21/21 0924     Clinical Impression Statement pt arrived with increased pain in left LB And hip. worked on core stab ex and pelvic ROM- alot of cuing needed to activate core and ppt. tight with PROM so added DN to see if we coudl get tightness to release.    PT Treatment/Interventions ADLs/Self Care Home Management;Cryotherapy;Electrical Stimulation;Iontophoresis 5m/ml Dexamethasone;Moist Heat;Traction;Ultrasound;Gait training;Stair training;Functional mobility training;Therapeutic activities;Therapeutic exercise;Balance training;Neuromuscular re-education;Patient/family education;Manual techniques;Passive range of motion;Dry needling;Taping;Vasopneumatic Device;Spinal Manipulations;Joint Manipulations    PT Next Visit Plan assess DN  strengthening, flexibility, core stability             Patient will benefit from skilled therapeutic intervention in order to improve the following deficits and impairments:  Difficulty walking, Increased muscle spasms, Obesity, Decreased activity tolerance, Pain, Impaired flexibility, Improper body mechanics, Decreased strength  Visit Diagnosis: Acute left-sided low back pain with left-sided sciatica  Muscle weakness (generalized)     Problem List Patient Active Problem List   Diagnosis Date Noted   Dizziness 12/02/2020   Nonspecific abnormal electrocardiogram (ECG) (EKG) 12/02/2020   UTI (urinary tract infection)    Thyroid disease    Hypertension    Hyperlipidemia    HA (headache)    GERD (gastroesophageal reflux disease)    Colon polyps    Chicken pox    Arthritis    Allergy    [place] 11/30/2020   Retrognathia 11/30/2020   CPAP (continuous positive airway pressure) dependence 11/30/2020  OSA on  CPAP 11/30/2020   CPAP use counseling 11/04/2020   Carpal tunnel syndrome, bilateral 11/04/2020   Plantar fasciitis, bilateral 11/04/2020   Torn ligament 11/04/2020   History of vein stripping 11/04/2020   Shingles 2019    PAYSEUR,[name], PTA 03/21/2021, 9:32 AM  [facility] [address] [place], [place], [address] Phone: [phone]   Fax:  [phone]  Name: Alithea Dinkins MRN: [id number] Date of Birth: 05/02/1951    

## 2021-03-21 NOTE — Patient Instructions (Signed)

## 2021-03-21 NOTE — Addendum Note (Signed)
Addended by: Lisbeth Renshaw, Tasman Zapata HUA on: 03/21/2021 03:01 PM   Modules accepted: Orders

## 2021-03-22 ENCOUNTER — Encounter: Payer: Self-pay | Admitting: Obstetrics & Gynecology

## 2021-03-23 ENCOUNTER — Ambulatory Visit: Payer: Medicare Other | Admitting: Physical Therapy

## 2021-03-28 ENCOUNTER — Encounter: Payer: Self-pay | Admitting: Rehabilitative and Restorative Service Providers"

## 2021-03-28 ENCOUNTER — Other Ambulatory Visit: Payer: Self-pay

## 2021-03-28 ENCOUNTER — Ambulatory Visit: Payer: Medicare Other | Admitting: Rehabilitative and Restorative Service Providers"

## 2021-03-28 DIAGNOSIS — R262 Difficulty in walking, not elsewhere classified: Secondary | ICD-10-CM

## 2021-03-28 DIAGNOSIS — M5442 Lumbago with sciatica, left side: Secondary | ICD-10-CM | POA: Diagnosis not present

## 2021-03-28 DIAGNOSIS — R252 Cramp and spasm: Secondary | ICD-10-CM

## 2021-03-28 DIAGNOSIS — M6281 Muscle weakness (generalized): Secondary | ICD-10-CM

## 2021-03-28 NOTE — Therapy (Signed)
[facility] [address] [place], [place], [address] Phone: [phone]   Fax:  [phone]  Physical Therapy Treatment  Patient Details  Name: Tricia Dodson MRN: [id number] Date of Birth: 05-01-1951 Referring Provider (PT): Olive Bass, FNP   Encounter Date: 03/28/2021   PT End of Session - 03/28/21 0852     Visit Number 4    Date for PT Re-Evaluation 05/05/21    PT Start Time 0849    PT Stop Time 0930    PT Time Calculation (min) 41 min    Activity Tolerance Patient tolerated treatment well    Behavior During Therapy [facility] for tasks assessed/performed             Past Medical History:  Diagnosis Date   Allergy    Arthritis    Carpal tunnel syndrome, bilateral 11/04/2020   Both wrists.    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   Subjective Assessment - 03/28/21 0851     Subjective Pt states that the DN really helped her last session.    Patient Stated Goals I want to be able to go out walking again and go on outings with community group.    Currently in Pain? Yes    Pain Score 4     Pain Location Back    Pain Orientation Lower                                [facility] Adult PT Treatment/Exercise - 03/28/21 0001       Lumbar Exercises: Aerobic   Nustep level 5 x 5 minutes      Lumbar Exercises: Machines for Strengthening   Other Lumbar Machine Exercise seated row 15#, lats 20# 2x10.  lumbar ext with black tband 2x10    Other Lumbar Machine Exercise 5# straight arm extension 2x10, 10# AR press x10 each      Lumbar Exercises: Seated   Other Seated Lumbar Exercises pelvic ROM on dynadisc 10 each way      Knee/Hip Exercises: Standing   Forward Step Up Both;1 set;10 reps   With alt LE hip extension     Manual Therapy   Manual Therapy Soft tissue mobilization;Myofascial release    Manual therapy comments B lumbar and piriformis tightness    Soft tissue mobilization To lumbar paraspinals and B piriformis    Myofascial Release DN to B lumbar multifidi and B parasinal  Trigger Point Dry Needling - 03/28/21 0001     Consent Given? Yes    Muscles Treated Back/Hip Piriformis;Lumbar multifidi    Piriformis Response Twitch response elicited    Lumbar multifidi Response Twitch response elicited                     PT Short Term Goals - 03/28/21 1117       PT SHORT TERM GOAL #1   Title Pt will be independent with initial HEP.    Status Achieved               PT Long Term Goals - 03/14/21 1256       PT LONG TERM GOAL #1   Title Pt will be independent with advanced HEP.    Time 8    Period Weeks    Status New      PT LONG TERM GOAL #2   Title Pt will increase FOTO to 65%.    Time 8    Period Weeks    Status New      PT LONG TERM GOAL #3   Title Pt will report being able to walk for at least 30 minutes without increased pain to allow her to go out in the community with her group.    Time 8    Period Weeks    Status New      PT LONG TERM GOAL #4   Title Pt will demonstrate good posture and body mechanics during functional activities.    Time 8    Period  Weeks    Status New                   Plan - 03/28/21 1101     Clinical Impression Statement Pt stated that DN last session really helped and she would like to try again this session.  She is having overall decreased pain since last PT session and improved mobilty.  She tolerated session well and only required occasional cuing for posture and core stability.    PT Treatment/Interventions ADLs/Self Care Home Management;Cryotherapy;Electrical Stimulation;Iontophoresis 4mg /ml Dexamethasone;Moist Heat;Traction;Ultrasound;Gait training;Stair training;Functional mobility training;Therapeutic activities;Therapeutic exercise;Balance training;Neuromuscular re-education;Patient/family education;Manual techniques;Passive range of motion;Dry needling;Taping;Vasopneumatic Device;Spinal Manipulations;Joint Manipulations    PT Next Visit Plan assess DN  strengthening, flexibility, core stability    Consulted and Agree with Plan of Care Patient             Patient will benefit from skilled therapeutic intervention in order to improve the following deficits and impairments:  Difficulty walking, Increased muscle spasms, Obesity, Decreased activity tolerance, Pain, Impaired flexibility, Improper body mechanics, Decreased strength  Visit Diagnosis: Acute left-sided low back pain with left-sided sciatica  Muscle weakness (generalized)  Cramp and spasm  Difficulty in walking, not elsewhere classified     Problem List Patient Active Problem List   Diagnosis Date Noted   Dizziness 12/02/2020   Nonspecific abnormal electrocardiogram (ECG) (EKG) 12/02/2020   UTI (urinary tract infection)    Thyroid disease    Hypertension    Hyperlipidemia    HA (headache)    GERD (gastroesophageal reflux disease)    Colon polyps    Chicken pox    Arthritis    Allergy    Morbid obesity (HCC) 11/30/2020   Retrognathia 11/30/2020   CPAP (continuous positive airway pressure) dependence 11/30/2020   OSA  on CPAP 11/30/2020   CPAP use counseling 11/04/2020   Carpal tunnel syndrome, bilateral 11/04/2020   Plantar fasciitis,  bilateral 11/04/2020   Torn ligament 11/04/2020   History of vein stripping 11/04/2020   Shingles 2019    Reather Laurence, PT, DPT 03/28/2021, 11:18 AM  [facility] [address] [place], [place], [address] Phone: [phone]   Fax:  [phone]  Name: Tricia Dodson MRN: [id number] Date of Birth: 09-14-1950

## 2021-03-30 ENCOUNTER — Ambulatory Visit: Payer: Medicare Other | Admitting: Physical Therapy

## 2021-04-04 ENCOUNTER — Other Ambulatory Visit: Payer: Self-pay

## 2021-04-04 ENCOUNTER — Ambulatory Visit: Payer: Medicare Other | Admitting: Physical Therapy

## 2021-04-04 DIAGNOSIS — M6281 Muscle weakness (generalized): Secondary | ICD-10-CM

## 2021-04-04 DIAGNOSIS — M5442 Lumbago with sciatica, left side: Secondary | ICD-10-CM

## 2021-04-04 DIAGNOSIS — R262 Difficulty in walking, not elsewhere classified: Secondary | ICD-10-CM

## 2021-04-04 NOTE — Therapy (Signed)
[place]. [place], [place], [address] Phone: [phone]   Fax:  [phone]  Physical Therapy Treatment  Patient Details  Name: Tricia Dodson MRN: [id number] Date of Birth: 03-31-1951 Referring Provider (PT): Marrian Salvage, FNP   Encounter Date: 04/04/2021   PT End of Session - 04/04/21 0914     Visit Number 5    Date for PT Re-Evaluation 05/05/21    PT Start Time 0843    PT Stop Time 0932    PT Time Calculation (min) 49 min             Past Medical History:  Diagnosis Date   Allergy    Arthritis    Carpal tunnel syndrome, bilateral 11/04/2020   Both wrists.    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   CPAP use counseling 11/04/2020   GERD (gastroesophageal reflux disease)    HA (headache)    History of vein stripping 11/04/2020   Right leg   Hyperlipidemia    Hypertension    Morbid obesity ([place]) 11/30/2020   OSA on CPAP 11/30/2020   Plantar fasciitis, bilateral 11/04/2020   With Bone spors   Retrognathia 11/30/2020   Shingles 2019   Thyroid disease    Torn ligament 11/04/2020   Right knee   UTI (urinary tract infection)     Past Surgical History:  Procedure Laterality Date   BILATERAL CARPAL TUNNEL RELEASE     Hemorroids removed     KNEE ARTHROSCOPY Right    Plantar fasciatis      Repaired   right hand cyst removal     vein vascular     surgery    There were no vitals filed for this visit.   Subjective Assessment - 04/04/21 0845     Subjective overall feeling better, still troublesome across LB. some sensation not pain down left leg post. knee hurts but need to see ortho MD for that    Currently in Pain? Yes    Pain Score 4     Pain Location Back                               [facility] Adult PT Treatment/Exercise - 04/04/21 0001       Lumbar Exercises: Aerobic   Nustep level 5 x 6 minutes       Lumbar Exercises: Standing   Heel Raises 20 reps;3 seconds   black bar   Other Standing Lumbar Exercises 3# hip ext and abd 12 x each    Other Standing Lumbar Exercises 4# modified dead lift 2 sets 8      Lumbar Exercises: Seated   Other Seated Lumbar Exercises pelvic ROM on dynadisc 15 each way   scap stab and 3# LE ex on disc for stab. LBP RT > Left and pain with left hip flex     Lumbar Exercises: Supine   Ab Set 15 reps;3 seconds    Clam 15 reps   tband with ppt cuing   Bent Knee Raise 10 reps   tband with ppt cuing   Bridge Non-compliant;10 reps;3 seconds   feet on ball KTC and obl   Straight Leg Raise 10 reps   cued for PPT     Manual Therapy   Manual Therapy Soft tissue mobilization;Myofascial release    Manual therapy comments B  lumbar and piriformis tightness    Soft tissue mobilization To lumbar paraspinals and B piriformis    Myofascial Release DN to R lumbar multifidi and R parasinal              Trigger Point Dry Needling - 04/04/21 0001     Consent Given? Yes    Muscles Treated Back/Hip Lumbar multifidi    Lumbar multifidi Response Twitch response elicited                     PT Short Term Goals - 03/28/21 1117       PT SHORT TERM GOAL #1   Title Pt will be independent with initial HEP.    Status Achieved               PT Long Term Goals - 04/04/21 0905       PT LONG TERM GOAL #1   Title Pt will be independent with advanced HEP.    Status On-going      PT LONG TERM GOAL #3   Title Pt will report being able to walk for at least 30 minutes without increased pain to allow her to go out in the community with her group.    Baseline 10 min    Status Partially Met      PT LONG TERM GOAL #4   Title Pt will demonstrate good posture and body mechanics during functional activities.    Status On-going                   Plan - 04/04/21 0906     Clinical Impression Statement pt verb feeling better and doing more. reports she  can be on her feet more but needs to stop every 10 min. pt with increased lumbar lordosis so focus of ppt with ex today to engage core with cuing to increase stab. pt did have increased LBP with ex but tolerable and felt better after DN. pt also co dififculty with seated hip flex.progressing towards LTGs    PT Treatment/Interventions ADLs/Self Care Home Management;Cryotherapy;Electrical Stimulation;Iontophoresis 35m/ml Dexamethasone;Moist Heat;Traction;Ultrasound;Gait training;Stair training;Functional mobility training;Therapeutic activities;Therapeutic exercise;Balance training;Neuromuscular re-education;Patient/family education;Manual techniques;Passive range of motion;Dry needling;Taping;Vasopneumatic Device;Spinal Manipulations;Joint Manipulations    PT Next Visit Plan assess and progress,core stabd and flexibility             Patient will benefit from skilled therapeutic intervention in order to improve the following deficits and impairments:  Difficulty walking, Increased muscle spasms, Obesity, Decreased activity tolerance, Pain, Impaired flexibility, Improper body mechanics, Decreased strength  Visit Diagnosis: Acute left-sided low back pain with left-sided sciatica  Muscle weakness (generalized)  Difficulty in walking, not elsewhere classified     Problem List Patient Active Problem List   Diagnosis Date Noted   Dizziness 12/02/2020   Nonspecific abnormal electrocardiogram (ECG) (EKG) 12/02/2020   UTI (urinary tract infection)    Thyroid disease    Hypertension    Hyperlipidemia    HA (headache)    GERD (gastroesophageal reflux disease)    Colon polyps    Chicken pox    Arthritis    Allergy    [place] 11/30/[place]   Retrognathia 11/30/2020   CPAP (continuous positive airway pressure) dependence 11/30/2020   OSA on CPAP 11/30/2020   CPAP use counseling 11/04/2020   Carpal tunnel syndrome, bilateral 11/04/2020   Plantar fasciitis, bilateral 11/04/2020    Torn ligament 11/04/2020   History of vein stripping 11/04/2020   Shingles 2019  Naylene Foell,[name], PTA 04/04/2021, 9:33 AM  [place]. [place], [place], [address] Phone: [phone]   Fax:  [phone]  Name: Tricia Dodson MRN: [id number] Date of Birth: [birth year]

## 2021-04-06 ENCOUNTER — Other Ambulatory Visit: Payer: Self-pay

## 2021-04-06 ENCOUNTER — Ambulatory Visit: Payer: Medicare Other | Admitting: Rehabilitative and Restorative Service Providers"

## 2021-04-06 DIAGNOSIS — R262 Difficulty in walking, not elsewhere classified: Secondary | ICD-10-CM

## 2021-04-06 DIAGNOSIS — M5442 Lumbago with sciatica, left side: Secondary | ICD-10-CM

## 2021-04-06 DIAGNOSIS — R252 Cramp and spasm: Secondary | ICD-10-CM

## 2021-04-06 DIAGNOSIS — M6281 Muscle weakness (generalized): Secondary | ICD-10-CM

## 2021-04-06 NOTE — Therapy (Signed)
[place]. [place], [place], [address] Phone: [phone]   Fax:  [phone]  Physical Therapy Treatment  Patient Details  Name: Tricia Dodson MRN: [id number] Date of Birth: 04/27/1951 Referring Provider (PT): Marrian Salvage, FNP   Encounter Date: 04/06/2021   PT End of Session - 04/06/21 0851     Visit Number 6    Date for PT Re-Evaluation 05/05/21    PT Start Time 0845    PT Stop Time 0925    PT Time Calculation (min) 40 min    Activity Tolerance Patient tolerated treatment well    Behavior During Therapy [facility] for tasks assessed/performed             Past Medical History:  Diagnosis Date   Allergy    Arthritis    Carpal tunnel syndrome, bilateral 11/04/2020   Both wrists.    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   CPAP use counseling 11/04/2020   GERD (gastroesophageal reflux disease)    HA (headache)    History of vein stripping 11/04/2020   Right leg   Hyperlipidemia    Hypertension    Morbid obesity ([place]) 11/30/2020   OSA on CPAP 11/30/2020   Plantar fasciitis, bilateral 11/04/2020   With Bone spors   Retrognathia 11/30/2020   Shingles 2019   Thyroid disease    Torn ligament 11/04/2020   Right knee   UTI (urinary tract infection)     Past Surgical History:  Procedure Laterality Date   BILATERAL CARPAL TUNNEL RELEASE     Hemorroids removed     KNEE ARTHROSCOPY Right    Plantar fasciatis      Repaired   right hand cyst removal     vein vascular     surgery    There were no vitals filed for this visit.   Subjective Assessment - 04/06/21 0849     Subjective I am starting to feel that pain down my leg again.    Patient Stated Goals I want to be able to go out walking again and go on outings with community group.    Currently in Pain? Yes    Pain Score 5     Pain Location Back    Pain Orientation Lower;Left    Pain  Radiating Towards radiating down LLE                               [facility] Adult PT Treatment/Exercise - 04/06/21 0001       Ambulation/Gait   Gait Comments Amb outside over pavement and curbs with distant SBA, pt with most difficulty with curbs secondary to knee pain.      Lumbar Exercises: Aerobic   Nustep level 5 x 6 minutes      Lumbar Exercises: Standing   Other Standing Lumbar Exercises 3 way hip with 3# x10B with cuing for core stab.      Lumbar Exercises: Supine   Ab Set 15 reps;3 seconds    AB Set Limitations squeezing red tball in sitting    Pelvic Tilt 10 reps    Pelvic Tilt Limitations seated 4 way on dynadisc    Clam 15 reps   green tband with cuing for ppt   Bent Knee Raise 10 reps   green tband with ppt cuing   Dead Bug 10 reps  Bridge Non-compliant;10 reps;3 seconds   feet on ball KTC and obl   Straight Leg Raise 10 reps   with cuing for PPT     Lumbar Exercises: Prone   Straight Leg Raise 10 reps      Lumbar Exercises: Quadruped   Straight Leg Raise 10 reps                       PT Short Term Goals - 03/28/21 1117       PT SHORT TERM GOAL #1   Title Pt will be independent with initial HEP.    Status Achieved               PT Long Term Goals - 04/04/21 0905       PT LONG TERM GOAL #1   Title Pt will be independent with advanced HEP.    Status On-going      PT LONG TERM GOAL #3   Title Pt will report being able to walk for at least 30 minutes without increased pain to allow her to go out in the community with her group.    Baseline 10 min    Status Partially Met      PT LONG TERM GOAL #4   Title Pt will demonstrate good posture and body mechanics during functional activities.    Status On-going                   Plan - 04/06/21 0951     Clinical Impression Statement Tricia Dodson was having some pain radiating down her LLE today.  Continued to focus on core stability and strengthening during  session today.  Pt with difficulty with quadruped LE extension secondary to L knee pain.  She declined DN today, as she stated that she wanted to see how she felt without the service.    PT Treatment/Interventions ADLs/Self Care Home Management;Cryotherapy;Electrical Stimulation;Iontophoresis 26m/ml Dexamethasone;Moist Heat;Traction;Ultrasound;Gait training;Stair training;Functional mobility training;Therapeutic activities;Therapeutic exercise;Balance training;Neuromuscular re-education;Patient/family education;Manual techniques;Passive range of motion;Dry needling;Taping;Vasopneumatic Device;Spinal Manipulations;Joint Manipulations    PT Next Visit Plan assess and progress,core stabd and flexibility    Consulted and Agree with Plan of Care Patient             Patient will benefit from skilled therapeutic intervention in order to improve the following deficits and impairments:  Difficulty walking, Increased muscle spasms, Obesity, Decreased activity tolerance, Pain, Impaired flexibility, Improper body mechanics, Decreased strength  Visit Diagnosis: Acute left-sided low back pain with left-sided sciatica  Muscle weakness (generalized)  Difficulty in walking, not elsewhere classified  Cramp and spasm     Problem List Patient Active Problem List   Diagnosis Date Noted   Dizziness 12/02/2020   Nonspecific abnormal electrocardiogram (ECG) (EKG) 12/02/2020   UTI (urinary tract infection)    Thyroid disease    Hypertension    Hyperlipidemia    HA (headache)    GERD (gastroesophageal reflux disease)    Colon polyps    Chicken pox    Arthritis    Allergy    Morbid obesity ([place] 11/30/2020   Retrognathia 11/30/2020   CPAP (continuous positive airway pressure) dependence 11/30/2020   OSA on CPAP 11/30/2020   CPAP use counseling 11/04/2020   Carpal tunnel syndrome, bilateral 11/04/2020   Plantar fasciitis, bilateral 11/04/2020   Torn ligament 11/04/2020   History of vein stripping  11/04/2020   Shingles 2019    SJuel Burrow PT, DPT 04/06/2021, 9:55 AM  [facility]  [place]. [place], [place], [address] Phone: [phone]   Fax:  [phone]  Name: Tricia Dodson MRN: [id number] Date of Birth: [birth year]

## 2021-04-10 ENCOUNTER — Telehealth: Payer: Self-pay | Admitting: *Deleted

## 2021-04-11 ENCOUNTER — Other Ambulatory Visit: Payer: Self-pay

## 2021-04-11 ENCOUNTER — Encounter: Payer: Self-pay | Admitting: Adult Health

## 2021-04-11 ENCOUNTER — Ambulatory Visit (INDEPENDENT_AMBULATORY_CARE_PROVIDER_SITE_OTHER): Payer: Medicare Other | Admitting: Adult Health

## 2021-04-11 ENCOUNTER — Ambulatory Visit (INDEPENDENT_AMBULATORY_CARE_PROVIDER_SITE_OTHER): Payer: Medicare Other | Admitting: Family

## 2021-04-11 ENCOUNTER — Encounter: Payer: Self-pay | Admitting: Family

## 2021-04-11 ENCOUNTER — Ambulatory Visit: Payer: Medicare Other | Attending: Family | Admitting: Physical Therapy

## 2021-04-11 VITALS — BP 120/68 | HR 85 | Ht 66.0 in | Wt 269.0 lb

## 2021-04-11 VITALS — BP 132/70 | HR 85 | Temp 97.4°F | Ht 67.0 in | Wt 266.8 lb

## 2021-04-11 DIAGNOSIS — M25562 Pain in left knee: Secondary | ICD-10-CM

## 2021-04-11 DIAGNOSIS — E119 Type 2 diabetes mellitus without complications: Secondary | ICD-10-CM

## 2021-04-11 DIAGNOSIS — M6281 Muscle weakness (generalized): Secondary | ICD-10-CM | POA: Insufficient documentation

## 2021-04-11 DIAGNOSIS — Z9989 Dependence on other enabling machines and devices: Secondary | ICD-10-CM | POA: Diagnosis not present

## 2021-04-11 DIAGNOSIS — R262 Difficulty in walking, not elsewhere classified: Secondary | ICD-10-CM | POA: Diagnosis present

## 2021-04-11 DIAGNOSIS — R109 Unspecified abdominal pain: Secondary | ICD-10-CM | POA: Diagnosis not present

## 2021-04-11 DIAGNOSIS — M5442 Lumbago with sciatica, left side: Secondary | ICD-10-CM | POA: Diagnosis present

## 2021-04-11 DIAGNOSIS — R252 Cramp and spasm: Secondary | ICD-10-CM | POA: Diagnosis present

## 2021-04-11 DIAGNOSIS — M25561 Pain in right knee: Secondary | ICD-10-CM | POA: Diagnosis not present

## 2021-04-11 DIAGNOSIS — G8929 Other chronic pain: Secondary | ICD-10-CM | POA: Diagnosis present

## 2021-04-11 DIAGNOSIS — G4733 Obstructive sleep apnea (adult) (pediatric): Secondary | ICD-10-CM

## 2021-04-11 DIAGNOSIS — K219 Gastro-esophageal reflux disease without esophagitis: Secondary | ICD-10-CM

## 2021-04-11 MED ORDER — ESOMEPRAZOLE MAGNESIUM 40 MG PO CPDR
40.0000 mg | DELAYED_RELEASE_CAPSULE | Freq: Two times a day (BID) | ORAL | 3 refills | Status: DC
Start: 2021-04-11 — End: 2021-05-24

## 2021-04-11 NOTE — Therapy (Signed)
[place]. [place], [place], [address] Phone: [phone]   Fax:  [phone]  Physical Therapy Treatment  Patient Details  Name: Tricia Dodson MRN: [id number] Date of Birth: Aug 24, 1950 Referring Provider (PT): Marrian Salvage, FNP   Encounter Date: 04/11/2021   PT End of Session - 04/11/21 0927     Visit Number 7    Date for PT Re-Evaluation 05/05/21    PT Start Time 0847    PT Stop Time 0926    PT Time Calculation (min) 39 min             Past Medical History:  Diagnosis Date   Allergy    Arthritis    Carpal tunnel syndrome, bilateral 11/04/2020   Both wrists.    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   CPAP use counseling 11/04/2020   GERD (gastroesophageal reflux disease)    HA (headache)    History of vein stripping 11/04/2020   Right leg   Hyperlipidemia    Hypertension    Morbid obesity ([place]) 11/30/2020   OSA on CPAP 11/30/2020   Plantar fasciitis, bilateral 11/04/2020   With Bone spors   Retrognathia 11/30/2020   Shingles 2019   Thyroid disease    Torn ligament 11/04/2020   Right knee   UTI (urinary tract infection)     Past Surgical History:  Procedure Laterality Date   BILATERAL CARPAL TUNNEL RELEASE     Hemorroids removed     KNEE ARTHROSCOPY Right    Plantar fasciatis      Repaired   right hand cyst removal     vein vascular     surgery    There were no vitals filed for this visit.   Subjective Assessment - 04/11/21 0849     Subjective feeling pretty good this morning. pain is low and not radiating yet    Currently in Pain? Yes    Pain Score 2     Pain Location Back    Pain Orientation Left;Lower                               [facility] Adult PT Treatment/Exercise - 04/11/21 0001       Lumbar Exercises: Aerobic   Nustep level 5 x 6 minutes      Lumbar Exercises: Machines for  Strengthening   Cybex Lumbar Extension black tband 20 x   trunk ext 20 x black tband   Other Lumbar Machine Exercise seated row and lats 20 # 2 set s10      Lumbar Exercises: Seated   Other Seated Lumbar Exercises pelvic ROM on dynadisc 15 each way   LAQ on dyna disc 10 each with focus on maintaining neutral spine     Lumbar Exercises: Supine   Ab Set 15 reps;3 seconds   with ball   Clam 15 reps   green tband with ppt   Bent Knee Raise 20 reps   green tband ppt   Bridge Non-compliant;15 reps   feet on ball with KTC and obl too   Bridge with [employer] Compliant;15 reps;3 seconds      Knee/Hip Exercises: Standing   Lateral Step Up Both;1 set;10 reps;Hand Hold: 2;Step Height: 6"   opp leg abd   Forward Step Up Both;1 set;10 reps;Hand Hold: 2;Step Height: 6"   opp hip  ext   Walking with Sports Cord 30# 5x fwd/back,3 x each side                       PT Short Term Goals - 03/28/21 1117       PT SHORT TERM GOAL #1   Title Pt will be independent with initial HEP.    Status Achieved               PT Long Term Goals - 04/11/21 0924       PT LONG TERM GOAL #1   Title Pt will be independent with advanced HEP.    Status Partially Met      PT LONG TERM GOAL #3   Title Pt will report being able to walk for at least 30 minutes without increased pain to allow her to go out in the community with her group.    Status Partially Met      PT LONG TERM GOAL #4   Title Pt will demonstrate good posture and body mechanics during functional activities.    Status Partially Met                   Plan - 04/11/21 0927     Clinical Impression Statement pt arrived feeling pretty good. educ pt on need to activate core and neutral spine with mvmt/ex as she is in an increased lumbar lordosis.Marland Kitchen verb and tatcile cuing with ex. pt tolerated well- verb she could feel LB workingand did have soem increased kne pian that she is following up with MD for.    PT  Treatment/Interventions ADLs/Self Care Home Management;Cryotherapy;Electrical Stimulation;Iontophoresis 79m/ml Dexamethasone;Moist Heat;Traction;Ultrasound;Gait training;Stair training;Functional mobility training;Therapeutic activities;Therapeutic exercise;Balance training;Neuromuscular re-education;Patient/family education;Manual techniques;Passive range of motion;Dry needling;Taping;Vasopneumatic Device;Spinal Manipulations;Joint Manipulations    PT Next Visit Plan core stab             Patient will benefit from skilled therapeutic intervention in order to improve the following deficits and impairments:  Difficulty walking, Increased muscle spasms, Obesity, Decreased activity tolerance, Pain, Impaired flexibility, Improper body mechanics, Decreased strength  Visit Diagnosis: Muscle weakness (generalized)  Acute left-sided low back pain with left-sided sciatica     Problem List Patient Active Problem List   Diagnosis Date Noted   Dizziness 12/02/2020   Nonspecific abnormal electrocardiogram (ECG) (EKG) 12/02/2020   UTI (urinary tract infection)    Thyroid disease    Hypertension    Hyperlipidemia    HA (headache)    GERD (gastroesophageal reflux disease)    Colon polyps    Chicken pox    Arthritis    Allergy    [place]bid [place] 11/30/2020   Retrognathia 11/30/2020   CPAP (continuous positive airway pressure) dependence 11/30/2020   OSA on CPAP 11/30/2020   CPAP use counseling 11/04/2020   Carpal tunnel syndrome, bilateral 11/04/2020   Plantar fasciitis, bilateral 11/04/2020   Torn ligament 11/04/2020   History of vein stripping 11/04/2020   Shingles 2019    Laisa Larrick,[name], PTA 04/11/2021, 9:29 AM  [place] [address]Phone: [phone]  Fax:  [phone] Name: TRashmi TallentMRN: [id number] of Birth: [birth year]

## 2021-04-11 NOTE — Patient Instructions (Signed)
Your Plan:  Continue CPAP Change settings. DME should call you  Thank you for coming to see [place] at [facility]. I hope we have been able to provide you high quality care today.  You may receive a patient satisfaction survey over the next few weeks. We would appreciate your feedback and comments so that we may continue to improve ourselves and the health of our patients.

## 2021-04-11 NOTE — Progress Notes (Signed)
PATIENT: Tricia Dodson DOB: 1951-04-09  REASON FOR VISIT: follow up HISTORY FROM: patient PRIMARY NEUROLOGIST: Dr. Vickey Huger  HISTORY OF PRESENT ILLNESS: Today 04/11/21:  Tricia Dodson is a 70 year old female with a history of obstructive sleep apnea on CPAP.  Her download indicates that she use her machine 29 out of 30 days for compliance of 97%.  She use her machine greater than 4 hours each night.  On average she uses her machine 8 hours and 7 minutes.  Her residual AHI is 1.1 on 5-17 cmH2O. she does not have a significant leak.  She states that when she puts her mask on she feels that it does not give her complete air before it stops.  She states that this lasted for about 10 to 15 minutes and then the machine works fine.  HISTORY (Copied from Dr.Dohmeier's note)  Tricia Dodson is a 34 - year- old Caucasian female patient seen here upon FNP referral on 11/30/2020 from NP Dayton Scrape, her primary care provider.   Chief concern according to patient : see above     Tricia Dodson  has a past medical history of Allergy, Arthritis, Chicken pox, Colon polyps, GERD (gastroesophageal reflux disease), HA (headache), Hyperlipidemia, Hypertension, Thyroid disease, and UTI (urinary tract infection)..   The patient had the first sleep study in the year 2005  with a result of OSA being found. She is on her third CPAP, 70 years old.  She had surgery for her foot and her anesthetist asked her if she knew she had apnea, she did not. A year later she was tested. .    Sleep relevant medical history: Nocturia-none since CPAP-insomnia  In some nights. Obesity, sinus rhinitis-  Goiter.    Family medical /sleep history:Brother  on CPAP with OSA.    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  and lives in a household alone. No children. Pets are Present. 3 cats. 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  She had insomnia  often at that time.  Tobacco use; none .   ETOH use; socially , Caffeine intake in form of Coffee( 1 cup in AM) Tea (not and iced -  3 or more a day) . Regular exercise in form of walking. Has knee pain.           Sleep habits are as follows: The patient's dinner time is between 6 PM. The patient goes to bed at 11-12 PM and continues to sleep for intervals of 3 hours, wakes for 30 minutes, reads in book, not bathroom breaks.   The preferred sleep position is laterally , with the support of one pillow.GERD in the past, not currently.  Dreams are reportedly infrequent. 8  AM is the usual rise time.  The patient wakes up spontaneously.  She reports not feeling refreshed or restored in AM if not using CPAP-, but on CPAP she re rarely has  symptoms such as dry mouth , morning headaches , congestion- and residual fatigue.  Naps are taken rarely lasting from 15-30 minutes .      Reviews of CPAP download_  Tricia Dodson has used her CPAP compliantly for the last 30 days 29 out of 30 days with a compliance of over 4 hours for 90% of the time.  Average user time is 8 hours 28 minutes.  Her air sense 10 AutoSet which reportedly is about 71 years old or older as a  serial #[id number] 06/25/1966 1.  It is on factory settings and was never tailored to her needs a minimum pressure is 4 maximum pressure is 20 EPR level is 3 cmH2O.  She has achieved a AHI residual of only 1.4 apneas and hypopneas per hour of use with these being obstructive in origin.  Air leaks are moderate to mild at the 95th percentile 11.3 L/min.  The pressure at the 95th percentile is only 8.9 cmH2O.  She is using a nasal mask , N 20.     REVIEW OF SYSTEMS: Out of a complete 14 system review of symptoms, the patient complains only of the following symptoms, and all other reviewed systems are negative.  FSS 16 ESS 4  ALLERGIES: No Known Allergies  HOME MEDICATIONS: Outpatient Medications Prior to Visit  Medication Sig Dispense Refill    amitriptyline (ELAVIL) 10 MG tablet Take 1 tablet (10 mg total) by mouth at bedtime. 90 tablet 1   atorvastatin (LIPITOR) 10 MG tablet Take 1 tablet (10 mg total) by mouth daily. 90 tablet 3   cephALEXin (KEFLEX) 500 MG capsule Take 1 capsule (500 mg total) by mouth 3 (three) times daily. (Patient not taking: Reported on 03/20/2021) 21 capsule 0   fenofibrate micronized (LOFIBRA) 134 MG capsule Take 1 capsule (134 mg total) by mouth daily before breakfast. 90 capsule 1   gabapentin (NEURONTIN) 300 MG capsule TAKE 1 CAPSULE(300 MG) BY MOUTH TWICE DAILY 180 capsule 0   levocetirizine (XYZAL) 5 MG tablet Take 1 tablet (5 mg total) by mouth daily. 90 tablet 3   levothyroxine (SYNTHROID) 25 MCG tablet Take 1 tablet (25 mcg total) by mouth daily. 90 tablet 1   losartan (COZAAR) 50 MG tablet Take 1 tablet (50 mg total) by mouth daily. (Patient taking differently: Take 50 mg by mouth daily.) 90 tablet 1   meloxicam (MOBIC) 15 MG tablet TAKE 1 TABLET(15 MG) BY MOUTH DAILY 90 tablet 0   metFORMIN (GLUCOPHAGE-XR) 500 MG 24 hr tablet Take 1 tablet (500 mg total) by mouth 2 (two) times daily. 180 tablet 0   methocarbamol (ROBAXIN) 500 MG tablet Take 1 tablet (500 mg total) by mouth every 8 (eight) hours as needed. 30 tablet 0   omeprazole (PRILOSEC) 20 MG capsule Take 1 capsule (20 mg total) by mouth daily. (Patient taking differently: Take 20 mg by mouth daily.) 90 capsule 1   triamcinolone cream (KENALOG) 0.1 % Apply 1 application topically daily. 60 g 2   No facility-administered medications prior to visit.    PAST MEDICAL HISTORY: Past Medical History:  Diagnosis Date   Allergy    Arthritis    Carpal tunnel syndrome, bilateral 11/04/2020   Both wrists.    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   Shingles 2019   Thyroid disease    Torn ligament 11/04/2020   Right knee   UTI (urinary tract infection)     PAST SURGICAL HISTORY: Past Surgical History:  Procedure Laterality Date   BILATERAL CARPAL TUNNEL RELEASE     Hemorroids removed  KNEE ARTHROSCOPY Right    Plantar fasciatis      Repaired   right hand cyst removal     vein vascular     surgery    FAMILY HISTORY: Family History  Problem Relation Age of Onset   Arthritis Mother    Diabetes Father    Heart disease Father    Arthritis Sister    Diabetes Sister    Hyperlipidemia Sister    Hyperlipidemia Brother    Heart disease Brother        triple heart bypass    SOCIAL HISTORY: Social History   Socioeconomic History   Marital status: Single    Spouse name: Not on file   Number of children: Not on file   Years of education: Not on file   Highest education level: Not on file  Occupational History   Not on file  Tobacco Use   Smoking status: Former   Smokeless tobacco: Never  Substance and Sexual Activity   Alcohol use: Never   Drug use: Not on file   Sexual activity: Not Currently  Other Topics Concern   Not on file  Social History Narrative   Not on file   Social Determinants of Health   Financial Resource Strain: Not on file  Food Insecurity: Not on file  Transportation Needs: Not on file  Physical Activity: Not on file  Stress: Not on file  Social Connections: Not on file  Intimate Partner Violence: Not on file      PHYSICAL EXAM  Vitals:   04/11/21 1102  BP: 120/68  Pulse: 85  Weight: 269 lb (122 kg)  Height: 5\' 6"  (1.676 m)   Body mass index is 43.42 kg/m.  Generalized: Well developed, in no acute distress  Chest: Lungs clear to auscultation bilaterally  Neurological examination  Mentation:  Alert oriented to time, place, history taking. Follows all commands speech and language fluent Cranial nerve II-XII: Extraocular movements were full, visual field were full on confrontational test Head turning and shoulder shrug  were normal and symmetric. Motor: The motor testing reveals 5 over 5 strength of all 4 extremities. Good symmetric motor tone is noted throughout.  Sensory: Sensory testing is intact to soft touch on all 4 extremities. No evidence of extinction is noted.  Gait and station: Gait is normal.    DIAGNOSTIC DATA (LABS, IMAGING, TESTING) - I reviewed patient records, labs, notes, testing and imaging myself where available.  Lab Results  Component Value Date   WBC 6.2 02/24/2021   HGB 13.9 02/24/2021   HCT 41.8 02/24/2021   MCV 85.9 02/24/2021   PLT 323.0 02/24/2021      Component Value Date/Time   NA 140 02/24/2021 1024   K 4.6 02/24/2021 1024   CL 103 02/24/2021 1024   CO2 28 02/24/2021 1024   GLUCOSE 162 (H) 02/24/2021 1024   BUN 18 02/24/2021 1024   CREATININE 1.13 02/24/2021 1024   CALCIUM 9.7 02/24/2021 1024   PROT 7.0 02/24/2021 1024   ALBUMIN 4.2 02/24/2021 1024   AST 18 02/24/2021 1024   ALT 23 02/24/2021 1024   ALKPHOS 68 02/24/2021 1024   BILITOT 0.6 02/24/2021 1024   Lab Results  Component Value Date   CHOL 155 01/06/2021   HDL 42.50 01/06/2021   LDLCALC 77 01/06/2021   TRIG 173.0 (H) 01/06/2021   CHOLHDL 4 01/06/2021   Lab Results  Component Value Date   HGBA1C 7.9 (H) 02/24/2021   No results found for: 02/26/2021  Lab Results  Component Value Date   TSH 2.37 01/06/2021      ASSESSMENT AND PLAN 70 y.o. year old female  has a past medical history of Allergy, Arthritis, Carpal tunnel syndrome, bilateral (11/04/2020), Chicken pox, Colon polyps, CPAP (continuous positive airway pressure) dependence (11/30/2020), CPAP use counseling (11/04/2020), GERD (gastroesophageal reflux disease), HA (headache), History of vein stripping (11/04/2020),  Hyperlipidemia, Hypertension, Morbid obesity (HCC) (11/30/2020), OSA on CPAP (11/30/2020), Plantar fasciitis, bilateral (11/04/2020), Retrognathia (11/30/2020), Shingles (2019), Thyroid disease, Torn ligament (11/04/2020), and UTI (urinary tract infection). here with:  OSA on CPAP  - CPAP compliance excellent - Good treatment of AHI  - Will add EPR 2 - Encourage patient to use CPAP nightly and > 4 hours each night - F/U in 1 year or sooner if needed   Butch Penny, MSN, NP-C 04/11/2021, 10:58 AM [facility] [address], [address] [place], [place] [address] [phone]

## 2021-04-11 NOTE — Progress Notes (Signed)
Tricia Dodson is a 70 y.o. 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 90 tablet 1   atorvastatin (LIPITOR) 10 MG tablet Take 1 tablet (10 mg total) by mouth daily. 90 tablet 3   esomeprazole (NEXIUM) 40 MG capsule Take 1 capsule (40 mg total) by mouth 2 (two) times daily before a meal. 60 capsule 3   fenofibrate micronized (LOFIBRA) 134 MG capsule Take 1 capsule (134 mg total) by mouth daily before breakfast. 90 capsule 1   gabapentin (NEURONTIN) 300 MG capsule TAKE 1 CAPSULE(300 MG) BY MOUTH TWICE DAILY 180 capsule 0   levocetirizine (XYZAL) 5 MG tablet Take 1 tablet (5 mg total) by mouth daily. 90 tablet 3   levothyroxine (SYNTHROID) 25 MCG tablet Take 1 tablet (25 mcg total) by mouth daily. 90 tablet 1   losartan (COZAAR) 50 MG tablet Take 1 tablet (50 mg total) by mouth daily. (Patient taking differently: Take 50 mg by mouth daily.) 90 tablet 1   metFORMIN (GLUCOPHAGE-XR) 500 MG 24 hr tablet Take 1 tablet (500 mg total) by mouth 2 (two) times daily. 180 tablet 0   triamcinolone cream (KENALOG) 0.1 % Apply 1 application  topically daily. 60 g 2   meloxicam (MOBIC) 15 MG tablet TAKE 1 TABLET(15 MG) BY MOUTH DAILY (Patient not taking: Reported on 04/11/2021) 90 tablet 0   methocarbamol (ROBAXIN) 500 MG tablet Take 1 tablet (500 mg total) by mouth every 8 (eight) hours as needed. (Patient not taking: Reported on 04/11/2021) 30 tablet 0   No current facility-administered medications for this visit.    Allergies: Patient has no known allergies.  Past Medical History:  Diagnosis Date   Allergy    Arthritis    Carpal tunnel syndrome, bilateral 11/04/2020   Both wrists.    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   Shingles 2019   Thyroid disease    Torn ligament 11/04/2020   Right knee   UTI (urinary tract infection)     Past Surgical History:  Procedure Laterality Date   BILATERAL CARPAL TUNNEL RELEASE     Hemorroids removed  KNEE ARTHROSCOPY Right    Plantar fasciatis      Repaired   right hand cyst removal     vein vascular     surgery    Family History  Problem Relation Age of Onset   Arthritis Mother    Diabetes Father    Heart disease Father    Arthritis Sister    Diabetes Sister    Hyperlipidemia Sister    Hyperlipidemia Brother    Heart disease Brother        triple heart bypass   Sleep apnea Brother     Social History   Tobacco Use   Smoking status: Former   Smokeless tobacco: Never  Substance Use Topics   Alcohol use: Yes    Comment: occ    Subjective:  Patient presents with concerns for diarrhea/ nausea- ? If related to recent start of Metformin; abdominal ultrasound was scheduled at end of August-  for some reason test was not scheduled?  Also requesting referral to ortho regarding left knee pain;  Has lost 5 pounds since last OV;      Objective:  Vitals:   04/11/21 1343  BP: 132/70  Pulse: 85  Temp: (!) 97.4 F (36.3 C)  TempSrc: Oral  SpO2: 98%  Weight: 266 lb 12.8 oz (121 kg)  Height: _0  (1.702 m)    General: Well developed, well nourished, in no acute distress  Skin : Warm and dry.  Head: Normocephalic and atraumatic  Lungs: Respirations unlabored; clear to auscultation bilaterally without wheeze, rales, rhonchi  CVS exam: normal rate and regular rhythm.  Abdomen: Soft; nontender; nondistended;  Neurologic: Alert and oriented; speech intact; face symmetrical; moves all extremities well; CNII-XII intact without focal deficit   Assessment:  1. Abdominal pain, unspecified abdominal location   2. Gastroesophageal reflux disease, unspecified whether esophagitis present   3. Chronic pain of both knees   4. Controlled type 2 diabetes mellitus without complication, without long-term current use of insulin ([place])     Plan:  Update abdominal ultrasound; Change to Nexium 40 mg which she has tolerated in the past- try taking bid for the next month and then decrease to qd; Refer to sports medicine; ? If Metformin contributing to GI symptoms- hold for now; will update labs and consider other treatment options;  This visit occurred during the SARS-CoV-2 public health emergency.  Safety protocols were in place, including screening questions prior to the visit, additional usage of staff PPE, and extensive cleaning of exam room while observing appropriate contact time as indicated for disinfecting solutions.    No follow-ups on file.  Orders Placed This Encounter  Procedures   US Abdomen Complete    Standing Status:   Future    Standing Expiration Date:   04/11/2022    Order Specific Question:   Reason for Exam (SYMPTOM  OR DIAGNOSIS REQUIRED)    Answer:   abdominal pain     Order Specific Question:   Preferred imaging location?    Answer:   [facility]   Comp Met ([facility])   Hemoglobin A1c   Ambulatory referral to Sports Medicine    Referral Priority:   Routine    Referral Type:   Consultation    Number of Visits Requested:   1    Requested Prescriptions   Signed Prescriptions Disp Refills   esomeprazole (NEXIUM) 40 MG capsule 60 capsule 3    Sig: Take 1 capsule (40 mg total) by mouth 2 (two) times  daily before a meal.

## 2021-04-12 ENCOUNTER — Other Ambulatory Visit: Payer: Self-pay | Admitting: Family

## 2021-04-12 ENCOUNTER — Ambulatory Visit (INDEPENDENT_AMBULATORY_CARE_PROVIDER_SITE_OTHER): Payer: Medicare Other | Admitting: Cardiology

## 2021-04-12 ENCOUNTER — Encounter: Payer: Self-pay | Admitting: Cardiology

## 2021-04-12 VITALS — BP 122/68 | HR 89 | Ht 67.0 in | Wt 268.0 lb

## 2021-04-12 DIAGNOSIS — I10 Essential (primary) hypertension: Secondary | ICD-10-CM | POA: Diagnosis not present

## 2021-04-12 DIAGNOSIS — E782 Mixed hyperlipidemia: Secondary | ICD-10-CM

## 2021-04-12 DIAGNOSIS — G4733 Obstructive sleep apnea (adult) (pediatric): Secondary | ICD-10-CM | POA: Diagnosis not present

## 2021-04-12 DIAGNOSIS — Z9989 Dependence on other enabling machines and devices: Secondary | ICD-10-CM | POA: Diagnosis not present

## 2021-04-12 LAB — COMPREHENSIVE METABOLIC PANEL
ALT: 32 U/L (ref 0–35)
AST: 27 U/L (ref 0–37)
Albumin: 4.3 g/dL (ref 3.5–5.2)
Alkaline Phosphatase: 62 U/L (ref 39–117)
BUN: 12 mg/dL (ref 6–23)
CO2: 30 mEq/L (ref 19–32)
Calcium: 10.1 mg/dL (ref 8.4–10.5)
Chloride: 102 mEq/L (ref 96–112)
Creatinine, Ser: 1.2 mg/dL (ref 0.40–1.20)
GFR: 45.97 mL/min — ABNORMAL LOW (ref >60.00)
Glucose, Bld: 146 mg/dL — ABNORMAL HIGH (ref 70–99)
Potassium: 4.6 mEq/L (ref 3.5–5.1)
Sodium: 140 mEq/L (ref 135–145)
Total Bilirubin: 0.5 mg/dL (ref 0.2–1.2)
Total Protein: 6.9 g/dL (ref 6.0–8.3)

## 2021-04-12 LAB — HEMOGLOBIN A1C: Hgb A1c MFr Bld: 7.4 % — ABNORMAL HIGH (ref 4.6–6.5)

## 2021-04-12 MED ORDER — EMPAGLIFLOZIN 10 MG PO TABS: 10.0000 mg | ORAL_TABLET | Freq: Every day | ORAL | 1 refills | Status: DC

## 2021-04-12 NOTE — Progress Notes (Signed)
Cardiology Office Note:    Date:  04/12/2021   ID:  Tricia Dodson, DOB 06/15/1951, MRN [id number]  PCP:  Olive Bass, FNP  Cardiologist:  Gypsy Balsam, MD    Referring MD: Olive Bass,*   Chief Complaint  Patient presents with   Follow-up  Doing well  History of Present Illness:    Tricia Dodson is a 70 y.o. female with past medical history significant for essential hypertension, obesity, dizziness.  She was referred to [place] because of dizziness as well as some fatigue and shortness of breath.  She comes today to discuss results of her tests.  She did have echocardiogram done which showed no evidence of significant valvular pathology with preserved left ventricle ejection fraction, she also had monitor placed which showed only nonsustained supraventricular tachycardia she is asymptomatic.  Overall she is doing quite well she said she is building up her stamina.  She end up traveling to [place] which took her 17 hours each way she sustained some nerve injury while sitting for many hours in the car and now she is going to physical therapy.  Past Medical History:  Diagnosis Date   Allergy    Arthritis    Carpal tunnel syndrome, bilateral 11/04/2020   Both wrists.    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     Allergies:   Patient has no known allergies.   Social History   Socioeconomic History   Marital status: Single    Spouse name: Not on file   Number of children: Not on file   Years of education: Not on file   Highest education level: Not on file  Occupational History   Not on file  Tobacco Use   Smoking status: Former   Smokeless tobacco: Never  Vaping Use   Vaping Use: Never used  Substance and Sexual Activity   Alcohol use: Yes    Comment: occ   Drug use: Not on file   Sexual activity: Not Currently  Other Topics Concern   Not on file  Social History Narrative   Not on file   Social Determinants of Health   Financial Resource Strain: Not on file  Food Insecurity: Not on file  Transportation Needs: Not on file  Physical Activity: Not on file  Stress: Not on file  Social Connections: Not on file     Family History: The patient's family history includes Arthritis in her mother and  sister; Diabetes in her father and sister; Heart disease in her brother and father; Hyperlipidemia in her brother and sister; Sleep apnea in her brother. ROS:   Please see the history of present illness.    All 14 point review of systems negative except as described per history of present illness  EKGs/Labs/Other Studies Reviewed:      Recent Labs: 01/06/2021: TSH 2.37 02/24/2021: Hemoglobin 13.9; Platelets 323.0 04/11/2021: ALT 32; BUN 12; Creatinine, Ser 1.20; Potassium 4.6; Sodium 140  Recent Lipid Panel    Component Value Date/Time   CHOL 155 01/06/2021 0939   TRIG 173.0 (H) 01/06/2021 0939   HDL 42.50 01/06/2021 0939   CHOLHDL 4 01/06/2021 0939   VLDL 34.6 01/06/2021 0939   LDLCALC 77 01/06/2021 0939    Physical Exam:    VS:  BP 122/68 (BP Location:  Right Arm, Patient Position: Sitting)   Pulse 89   Ht 5\' 7"  (1.702 m)   Wt 268 lb (121.6 kg)   SpO2 95%   BMI 41.97 kg/m     Wt Readings from Last 3 Encounters:  04/12/21 268 lb (121.6 kg)  04/11/21 266 lb 12.8 oz (121 kg)  04/11/21 269 lb (122 kg)     GEN:  Well nourished, well developed in no acute distress HEENT: Normal NECK: No JVD; No carotid bruits LYMPHATICS: No lymphadenopathy CARDIAC: RRR, no murmurs, no rubs, no gallops RESPIRATORY:  Clear to auscultation without rales, wheezing or rhonchi  ABDOMEN: Soft, non-tender, non-distended MUSCULOSKELETAL:  No edema; No deformity  SKIN: Warm and dry LOWER EXTREMITIES: no swelling NEUROLOGIC:  Alert and oriented x 3 PSYCHIATRIC:  Normal affect   ASSESSMENT:    1. Primary hypertension   2. OSA on CPAP   3. Mixed hyperlipidemia    PLAN:    In order of problems listed above:  Dizziness denies having any.  Monitor did not show any significant arrhythmia continue monitoring. Obstructive sleep apnea on CPAP followed by antimedicine team Dyslipidemia, I did review K PN from 01/06/2021 which show LDL 77 HDL 42 we will continue present management which include atorvastatin. Again we discussed need to exercise on the regular basis as well as good diet which she is trying to do.  She lost few pounds and she is very proud of herself and she should be.  I will see her back in my office in about 6 months   Medication Adjustments/Labs and Tests Ordered: Current medicines are reviewed at length with the patient today.  Concerns regarding medicines are outlined above.  No orders of the defined types were placed in this encounter.  Medication changes: No orders of the defined types were placed in this encounter.   Signed, 03/09/2021, MD, [facility] 04/12/2021 4:33 PM    [facility]

## 2021-04-12 NOTE — Patient Instructions (Signed)

## 2021-04-13 ENCOUNTER — Encounter: Payer: Self-pay | Admitting: Rehabilitative and Restorative Service Providers"

## 2021-04-13 ENCOUNTER — Other Ambulatory Visit: Payer: Self-pay

## 2021-04-13 ENCOUNTER — Ambulatory Visit: Payer: Medicare Other | Admitting: Rehabilitative and Restorative Service Providers"

## 2021-04-13 ENCOUNTER — Telehealth: Payer: Self-pay | Admitting: *Deleted

## 2021-04-13 DIAGNOSIS — M6281 Muscle weakness (generalized): Secondary | ICD-10-CM

## 2021-04-13 DIAGNOSIS — M5442 Lumbago with sciatica, left side: Secondary | ICD-10-CM

## 2021-04-13 DIAGNOSIS — R262 Difficulty in walking, not elsewhere classified: Secondary | ICD-10-CM

## 2021-04-13 DIAGNOSIS — R252 Cramp and spasm: Secondary | ICD-10-CM

## 2021-04-13 NOTE — Therapy (Signed)
[address]. [place], [place], [address] Phone: [phone]   Fax:  [phone]  Physical Therapy Treatment  Patient Details  Name: Tricia Dodson MRN: [id number] Date of Birth: June 20, 1951 Referring Provider (PT): Marrian Salvage, FNP   Encounter Date: 04/13/2021   PT End of Session - 04/13/21 0850     Visit Number 8    Date for PT Re-Evaluation 05/05/21    PT Start Time 0845    PT Stop Time 0925    PT Time Calculation (min) 40 min    Activity Tolerance Patient tolerated treatment well    Behavior During Therapy [facility] for tasks assessed/performed             Past Medical History:  Diagnosis Date   Allergy    Arthritis    Carpal tunnel syndrome, bilateral 11/04/2020   Both wrists.    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   Subjective Assessment - 04/13/21 0848     Subjective I go next week to see the doctor about my knee.    Patient Stated Goals I want to be able to go out walking again and go on outings with community group.    Currently in Pain? Yes    Pain Score 2     Pain Location Back    Pain Orientation Lower    Pain  Descriptors / Indicators Dull;Aching                               [facility] Adult PT Treatment/Exercise - 04/13/21 0001       Transfers   Five time sit to stand comments  17.2 sec without UE      Lumbar Exercises: Aerobic   Nustep level 5 x 6 minutes      Lumbar Exercises: Machines for Strengthening   Cybex Lumbar Extension black tband 20 x   trunk ext 20 x black tband   Cybex Knee Extension 10# 2x10    Cybex Knee Flexion 25# 2x10    Leg Press 40# 2x10    Other Lumbar Machine Exercise seated row and lats 20 # 2x10      Lumbar Exercises: Seated   Long Arc Quad on Chair Both;2 sets;10 reps   on dynadisc, cuing to focus on maintaining neutral spine alignment     Lumbar Exercises: Supine   Ab Set 15 reps;3 seconds   with ball   Clam 15  reps    Clam Limitations 2x15 with ppt and green tband    Bent Knee Raise 20 reps    Bent Knee Raise Limitations with ppt and green tband                       PT Short Term Goals - 03/28/21 1117       PT SHORT TERM GOAL #1   Title Pt will be independent with initial HEP.    Status Achieved               PT Long Term Goals - 04/13/21 1107       PT LONG TERM GOAL #1   Title Pt will be independent with advanced HEP.    Status Partially Met                   Plan - 04/13/21 1104     Clinical Impression Statement Pt continues to progress towards goal related activities.  She is progressing with improved 5 times sit to/from stand exercise.  Educated pt on progression of HEP and provided with handout, she verbalized understanding and able to return demonstration.  She continues to have some increased knee pain with some exercises and is going to see her MD next week for follow up.    PT Treatment/Interventions ADLs/Self Care Home Management;Cryotherapy;Electrical Stimulation;Iontophoresis 12m/ml Dexamethasone;Moist Heat;Traction;Ultrasound;Gait training;Stair training;Functional mobility  training;Therapeutic activities;Therapeutic exercise;Balance training;Neuromuscular re-education;Patient/family education;Manual techniques;Passive range of motion;Dry needling;Taping;Vasopneumatic Device;Spinal Manipulations;Joint Manipulations    PT Next Visit Plan core stab, strengthening    PT Home Exercise Plan Access Code: [email or web address]   Consulted and Agree with Plan of Care Patient             Patient will benefit from skilled therapeutic intervention in order to improve the following deficits and impairments:  Difficulty walking, Increased muscle spasms, Obesity, Decreased activity tolerance, Pain, Impaired flexibility, Improper body mechanics, Decreased strength  Visit Diagnosis: Muscle weakness (generalized)  Acute left-sided low back pain with left-sided sciatica  Difficulty in walking, not elsewhere classified  Cramp and spasm     Problem List Patient Active Problem List   Diagnosis Date Noted   Dizziness 12/02/2020   Nonspecific abnormal electrocardiogram (ECG) (EKG) 12/02/2020   UTI (urinary tract infection)    Thyroid disease    Hypertension    Hyperlipidemia    HA (headache)    GERD (gastroesophageal reflux disease)    Colon polyps    Chicken pox    Arthritis    Allergy    [place] 11/30/2020   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

## 2021-04-13 NOTE — Telephone Encounter (Signed)
Emailing [facility] for a new order in epic for patient

## 2021-04-13 NOTE — Telephone Encounter (Signed)
Your PA has been resolved, no additional PA is required

## 2021-04-13 NOTE — Telephone Encounter (Signed)
Prior auth started via cover my meds.  Awaiting determination.  Key: [id number]

## 2021-04-17 ENCOUNTER — Ambulatory Visit: Payer: Medicare Other | Admitting: Family Medicine

## 2021-04-17 ENCOUNTER — Other Ambulatory Visit: Payer: Self-pay

## 2021-04-17 ENCOUNTER — Ambulatory Visit (HOSPITAL_BASED_OUTPATIENT_CLINIC_OR_DEPARTMENT_OTHER)
Admission: RE | Admit: 2021-04-17 | Discharge: 2021-04-17 | Disposition: A | Discharge status: Home / Self Care | Payer: Medicare Other | Source: Ambulatory Visit | Attending: Family | Admitting: Family

## 2021-04-17 DIAGNOSIS — R109 Unspecified abdominal pain: Secondary | ICD-10-CM | POA: Insufficient documentation

## 2021-04-18 ENCOUNTER — Encounter: Payer: Self-pay | Admitting: Family Medicine

## 2021-04-18 ENCOUNTER — Other Ambulatory Visit: Payer: Self-pay | Admitting: Family

## 2021-04-18 ENCOUNTER — Ambulatory Visit (INDEPENDENT_AMBULATORY_CARE_PROVIDER_SITE_OTHER): Payer: Medicare Other | Admitting: Family Medicine

## 2021-04-18 ENCOUNTER — Ambulatory Visit: Payer: Medicare Other | Admitting: Physical Therapy

## 2021-04-18 ENCOUNTER — Ambulatory Visit: Payer: Self-pay

## 2021-04-18 VITALS — Ht 67.0 in | Wt 264.0 lb

## 2021-04-18 DIAGNOSIS — M5442 Lumbago with sciatica, left side: Secondary | ICD-10-CM

## 2021-04-18 DIAGNOSIS — M6281 Muscle weakness (generalized): Secondary | ICD-10-CM

## 2021-04-18 DIAGNOSIS — K8020 Calculus of gallbladder without cholecystitis without obstruction: Secondary | ICD-10-CM

## 2021-04-18 DIAGNOSIS — M170 Bilateral primary osteoarthritis of knee: Secondary | ICD-10-CM

## 2021-04-18 DIAGNOSIS — R262 Difficulty in walking, not elsewhere classified: Secondary | ICD-10-CM

## 2021-04-18 DIAGNOSIS — R252 Cramp and spasm: Secondary | ICD-10-CM

## 2021-04-18 DIAGNOSIS — M25562 Pain in left knee: Secondary | ICD-10-CM

## 2021-04-18 NOTE — Progress Notes (Signed)
Tricia Dodson - 70 y.o. female MRN [id number]  Date of birth: September 26, 1950  SUBJECTIVE:  Including CC & ROS.  No chief complaint on file.   Tricia Dodson is a 70 y.o. female that is presenting with acute on chronic bilateral knee pain.  The left is worse than the right.  She has had surgery in one of her knees but does not remember which one.  The pain is occurring over the medial joint space.   Review of Systems See HPI   HISTORY: Past Medical, Surgical, Social, and Family History Reviewed & Updated per EMR.   Pertinent Historical Findings include:  Past Medical History:  Diagnosis Date   Allergy    Arthritis    Carpal tunnel syndrome, bilateral 11/04/2020   Both wrists.    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   Shingles 2019   Thyroid disease    Torn ligament 11/04/2020   Right knee   UTI (urinary tract infection)     Past Surgical History:  Procedure Laterality Date   BILATERAL CARPAL TUNNEL RELEASE     Hemorroids removed     KNEE ARTHROSCOPY Right    Plantar fasciatis      Repaired   right hand cyst removal     vein vascular     surgery    Family History  Problem Relation Age of Onset   Arthritis Mother    Diabetes Father    Heart disease Father    Arthritis Sister    Diabetes Sister    Hyperlipidemia Sister    Hyperlipidemia Brother    Heart disease Brother        triple heart bypass   Sleep apnea Brother     Social History   Socioeconomic History   Marital status: Single    Spouse name: Not on file   Number of children: Not on file   Years of education: Not on file    Highest education level: Not on file  Occupational History   Not on file  Tobacco Use   Smoking status: Former   Smokeless tobacco: Never  Vaping Use   Vaping Use: Never used  Substance and Sexual Activity   Alcohol use: Yes    Comment: occ   Drug use: Not on file   Sexual activity: Not Currently  Other Topics Concern   Not on file  Social History Narrative   Not on file   Social Determinants of Health   Financial Resource Strain: Not on file  Food Insecurity: Not on file  Transportation Needs: Not on file  Physical Activity: Not on file  Stress: Not on file  Social Connections: Not on file  Intimate Partner Violence: Not on file     PHYSICAL EXAM:  VS: Ht 5\' 7"  (1.702 m)   Wt 264 lb (119.7 kg)   BMI 41.35 kg/m  Physical Exam Gen: NAD, alert, cooperative with exam, well-appearing   Limited ultrasound: Left knee:  No effusion suprapatellar pouch. Normal-appearing quadricep and patellar tendon. Moderate medial joint space narrowing with outpouching of the  medial meniscus. No changes of the lateral meniscus  Summary: Degenerative changes of the medial joint space  Ultrasound and interpretation by Clare Gandy, MD     ASSESSMENT & PLAN:   Primary osteoarthritis of both knees Has degenerative changes appreciated. -Counseled on home exercise therapy and supportive care. -Counseled on Voltaren. -Referral to physical therapy. -Could consider injection or further imaging

## 2021-04-18 NOTE — Assessment & Plan Note (Signed)
Has degenerative changes appreciated. -Counseled on home exercise therapy and supportive care. -Counseled on Voltaren. -Referral to physical therapy. -Could consider injection or further imaging

## 2021-04-18 NOTE — Therapy (Signed)
[address]. [place], [place], [address] Phone: [phone]   Fax:  [phone]  Physical Therapy Treatment  Patient Details  Name: Tricia Dodson MRN: [id number] Date of Birth: 07-17-1950 Referring Provider (PT): Marrian Salvage, FNP   Encounter Date: 04/18/2021   PT End of Session - 04/18/21 0940     Visit Number 9    Date for PT Re-Evaluation 05/05/21    PT Start Time 0935    PT Stop Time 1015    PT Time Calculation (min) 40 min    Activity Tolerance Patient tolerated treatment well    Behavior During Therapy [facility] for tasks assessed/performed             Past Medical History:  Diagnosis Date   Allergy    Arthritis    Carpal tunnel syndrome, bilateral 11/04/2020   Both wrists.    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   Subjective Assessment - 04/18/21 0938     Subjective I'm seeing the doctor this afternoon for my knee. No other complaints or issues; pt reports compliance to her HEP.    Pertinent History HTN, L knee torn meniscus    Limitations Walking;Standing    How long can you sit comfortably? Depends the seating surface, but  her reclincer works best    Patient Stated Goals I want to be able to go out walking again and go on outings with community group.    Currently in Pain? Yes    Pain Score 2     Pain Location Back    Pain Orientation Lower    Pain Descriptors / Indicators Aching;Dull    Pain Type Acute pain    Pain Onset 1 to 4 weeks ago                               [facility] Adult PT Treatment/Exercise - 04/18/21 0001       Lumbar Exercises: Stretches   Other Lumbar Stretch Exercise Trunk rotation 10 x 5 sec      Lumbar Exercises: Supine   Bent Knee Raise 10 reps    Bent Knee Raise Limitations with ppt and green tband    Bridge Compliant;20 reps    Bridge Limitations cues to decrease increased lumbra extension    Other Supine Lumbar Exercises 90/90 abdominal bracing 2x20 sec      Lumbar Exercises: Sidelying   Clam 20 reps;Left  green tband     Knee/Hip Exercises: Stretches   Active Hamstring Stretch 10 seconds    Active Hamstring Stretch Limitations attempted but could feel pull in her back    Hip Flexor Stretch Left;20 seconds    Piriformis Stretch 30 seconds    Other Knee/Hip Stretches L glute stretch x 30 sec      Manual Therapy   Manual Therapy Soft tissue mobilization;Myofascial release;Joint mobilization    Joint Mobilization L hip grade III inferior mob    Soft tissue mobilization STM & TPR L glute max & gute med    Myofascial Release L glute max & gute med                     PT Education - 04/18/21 1019     Education Details Discussed changes to HEP. Discussed using ball for self trigger point release at home    Person(s) Educated Patient    Methods Explanation;Handout;Demonstration;Verbal cues    Comprehension Verbalized understanding;Returned demonstration              PT Short Term Goals - 03/28/21 1117       PT SHORT TERM GOAL #1   Title Pt will be independent with initial HEP.    Status Achieved               PT Long Term  Goals - 04/18/21 1206       PT LONG TERM GOAL #1   Title Pt will be independent with advanced HEP.    Status Partially Met      PT LONG TERM GOAL #3   Title Pt will report being able to walk for at least 30 minutes without increased pain to allow her to go out in the community with her group.    Baseline 10 min      PT LONG TERM GOAL #4   Title Pt will demonstrate good posture and body mechanics during functional activities.                   Plan - 04/18/21 1203     Clinical Impression Statement Pt with less pain today. Continued to progress pt's core and hip strengthening exercise. Pt demos continued trigger points in her L glute; improved after manual therapy. Provided manual stretch/mobs for L hip due to pt feeling increased hip flexors vs core during supine marches. Pt is to see her MD this afternoon in regards to her knee pain.    Personal Factors and Comorbidities Age;Comorbidity 2    Comorbidities HTN, L knee meniscus tear    Examination-Activity Limitations Locomotion Level;Stairs    Examination-Participation Restrictions Community Activity    Rehab Potential Good    PT Frequency 2x / week    PT Duration 8 weeks    PT Treatment/Interventions ADLs/Self Care Home Management;Cryotherapy;Electrical Stimulation;Iontophoresis 47m/ml Dexamethasone;Moist Heat;Traction;Ultrasound;Gait training;Stair training;Functional mobility training;Therapeutic activities;Therapeutic exercise;Balance training;Neuromuscular re-education;Patient/family education;Manual techniques;Passive range of motion;Dry needling;Taping;Vasopneumatic Device;Spinal Manipulations;Joint Manipulations    PT Next Visit Plan core stab, strengthening; consider hip mobs/stretching. Did she try self trigger point release with ball?    PT Home Exercise Plan Access Code: [email or web address]   Consulted and Agree with Plan of Care Patient             Patient will benefit from skilled therapeutic intervention in order to  improve the following deficits and impairments:  Difficulty walking, Increased muscle spasms, Obesity, Decreased activity tolerance, Pain, Impaired flexibility, Improper body mechanics, Decreased  strength  Visit Diagnosis: Muscle weakness (generalized)  Acute left-sided low back pain with left-sided sciatica  Difficulty in walking, not elsewhere classified  Cramp and spasm     Problem List Patient Active Problem List   Diagnosis Date Noted   Dizziness 12/02/2020   Nonspecific abnormal electrocardiogram (ECG) (EKG) 12/02/2020   UTI (urinary tract infection)    Thyroid disease    Hypertension    Hyperlipidemia    HA (headache)    GERD (gastroesophageal reflux disease)    Colon polyps    Chicken pox    Arthritis    Allergy    Morbid obesity ([place]) 11/30/2020   Retrognathia 11/30/2020   CPAP (continuous positive airway pressure) dependence 11/30/2020   OSA on CPAP 11/30/2020   CPAP use counseling 11/04/2020   Carpal tunnel syndrome, bilateral 11/04/2020   Plantar fasciitis, bilateral 11/04/2020   Torn ligament 11/04/2020   History of vein stripping 11/04/2020   Shingles 2019    Jacori Mulrooney April Gordy Levan, PT, DPT 04/18/2021, 12:12 PM  [address]. [place], [place], [address] Phone: [phone]   Fax:  [phone]  Name: Ferrell Flam MRN: [id number] Date of Birth: Nov 17, 1950

## 2021-04-18 NOTE — Patient Instructions (Signed)
Nice to meet you Please try ice as needed  Please try voltaren  Please try physical therapy   Please try the exercises  Please send me a message in MyChart with any questions or updates.  Please see me back in 4 weeks.   --Dr. Jordan Likes

## 2021-04-25 ENCOUNTER — Other Ambulatory Visit: Payer: Self-pay | Admitting: Family

## 2021-04-25 ENCOUNTER — Ambulatory Visit: Payer: Medicare Other | Admitting: Physical Therapy

## 2021-04-25 ENCOUNTER — Other Ambulatory Visit: Payer: Self-pay

## 2021-04-25 DIAGNOSIS — R262 Difficulty in walking, not elsewhere classified: Secondary | ICD-10-CM

## 2021-04-25 DIAGNOSIS — M6281 Muscle weakness (generalized): Secondary | ICD-10-CM

## 2021-04-25 NOTE — Therapy (Signed)
[address]. [place], [place], [address] Phone: [phone]   Fax:  [phone] Progress Note Reporting Period 03/14/21 to 04/25/21 for the first 10 visits  See note below for Objective Data and Assessment of Progress/Goals.     Physical Therapy Treatment  Patient Details  Name: Tricia Dodson MRN: [id number] Date of Birth: 1951-01-18 Referring Provider (PT): Marrian Salvage, FNP   Encounter Date: 04/25/2021   PT End of Session - 04/25/21 1606     Visit Number 10    Date for PT Re-Evaluation 05/05/21    PT Start Time 7494    PT Stop Time 1610    PT Time Calculation (min) 40 min             Past Medical History:  Diagnosis Date   Allergy    Arthritis    Carpal tunnel syndrome, bilateral 11/04/2020   Both wrists.    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   Subjective Assessment - 04/25/21 1533     Subjective saw MD and he said PT, strengthen, ice and heat. walked 1 mile yesterday and knees were sore. c/o knee giving way on steps a couple times    Currently in Pain? Yes    Pain Score 3     Pain Location Knee                                [facility] Adult PT Treatment/Exercise - 04/25/21 0001       Lumbar Exercises: Aerobic   Nustep level 5 x 6 minutes      Lumbar Exercises: Standing   Other Standing Lumbar Exercises wt ball OH ext 10x and trunk rotation 10 each way    Other Standing Lumbar Exercises cable pulleys shld ext and row 2 sets 10   iso abs 2 sets 10     Knee/Hip Exercises: Machines for Strengthening   Cybex Knee Extension 15# BIL 12 x, 10 # SL 10 each    Cybex Knee Flexion 25# 15 x BIL, 15# SL 12 each      Knee/Hip Exercises: Standing   Walking with Sports Cord 30# 5x fwd/back,3 x each side      Knee/Hip Exercises: Seated   Sit to Sand 2 sets;10 reps;without UE support   on airex with wt ball chets press and OH  PT Short Term Goals - 03/28/21 1117       PT SHORT TERM GOAL #1   Title Pt will be independent with initial HEP.    Status Achieved               PT Long Term Goals - 04/25/21 1606       PT LONG TERM GOAL #1   Title Pt will be independent with advanced HEP.    Status Partially Met      PT LONG TERM GOAL #2   Title Pt will increase FOTO to 65%.    Status Partially Met      PT LONG TERM GOAL #3   Title Pt will report being able to walk for at least 30 minutes without increased pain to allow her to go out in the community with her group.    Baseline 10 min    Status Partially Met      PT LONG TERM GOAL #4   Title Pt will demonstrate good posture and body mechanics during functional activities.    Status Partially Met                   Plan - 04/25/21 1607     Clinical Impression Statement pt states MD ordered PT for knees, we have script and eval for next week. progressed func core ex with cuing needed. knee pain with ex but no c/o back/hip pain. progressing with goals    PT Treatment/Interventions ADLs/Self Care Home Management;Cryotherapy;Electrical Stimulation;Iontophoresis 45m/ml  Dexamethasone;Moist Heat;Traction;Ultrasound;Gait training;Stair training;Functional mobility training;Therapeutic activities;Therapeutic exercise;Balance training;Neuromuscular re-education;Patient/family education;Manual techniques;Passive range of motion;Dry needling;Taping;Vasopneumatic Device;Spinal Manipulations;Joint Manipulations    PT Next Visit Plan assess knee             Patient will benefit from skilled therapeutic intervention in order to improve the following deficits and impairments:  Difficulty walking, Increased muscle spasms, Obesity, Decreased activity tolerance, Pain, Impaired flexibility, Improper body mechanics, Decreased strength  Visit Diagnosis: Muscle weakness (generalized)  Difficulty in walking, not elsewhere classified     Problem List Patient Active Problem List   Diagnosis Date Noted   Primary osteoarthritis of both knees 04/18/2021   Dizziness 12/02/2020   Nonspecific abnormal electrocardiogram (ECG) (EKG) 12/02/2020   UTI (urinary tract infection)    Thyroid disease    Hypertension    Hyperlipidemia    HA (headache)    GERD (gastroesophageal reflux disease)    Colon polyps    Chicken pox    Arthritis    Allergy    [place] ([place] 11/30/2020   Retrognathia 11/30/2020   CPAP (continuous positive airway pressure) dependence 11/30/2020   OSA on CPAP 11/30/2020   CPAP use counseling 11/04/2020   Carpal tunnel syndrome, bilateral 11/04/2020   Plantar fasciitis, bilateral 11/04/2020   Torn ligament 11/04/2020   History of vein stripping 11/04/2020   Shingles 2019    Tricia Dodson,Tricia Dodson, PTA 04/25/2021, 4:09 PM  [place] [address]Phone: [phone]  Fax:  [phone] Name: Tricia GarmanyMRN: [id number] of Birth: 710-31-1952

## 2021-04-27 ENCOUNTER — Ambulatory Visit: Payer: Medicare Other | Admitting: Physical Therapy

## 2021-04-27 ENCOUNTER — Other Ambulatory Visit: Payer: Self-pay

## 2021-04-27 DIAGNOSIS — M6281 Muscle weakness (generalized): Secondary | ICD-10-CM | POA: Diagnosis not present

## 2021-04-27 DIAGNOSIS — R262 Difficulty in walking, not elsewhere classified: Secondary | ICD-10-CM

## 2021-04-27 NOTE — Therapy (Signed)
[place]. [place], [place], [address] Phone: [phone]   Fax:  [phone]  Physical Therapy Treatment  Patient Details  Name: Tricia Dodson MRN: [id number] Date of Birth: [birth year] Referring Provider (PT): Marrian Salvage, FNP   Encounter Date: 04/27/2021   PT End of Session - 04/27/21 1605     Visit Number 11    Date for PT Re-Evaluation 05/05/21    PT Start Time 5397    PT Stop Time 1610    PT Time Calculation (min) 40 min             Past Medical History:  Diagnosis Date   Allergy    Arthritis    Carpal tunnel syndrome, bilateral 11/04/2020   Both wrists.    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   Subjective Assessment - 04/27/21 1537     Subjective left knee feels very weak, did okay after last session    Currently in Pain? Yes    Pain Score 1     Pain Location Knee    Pain Orientation Left                               [facility] Adult PT Treatment/Exercise - 04/27/21 0001       Knee/Hip Exercises: Aerobic   Recumbent Bike L 3 5 min      Knee/Hip Exercises: Standing   Lateral Step Up Both;10  reps;Hand Hold: 2;Step Height: 6"   opp leg abd   Forward Step Up Both;10 reps;Hand Hold: 2;Step Height: 6"   opp leg ext   Other Standing Knee Exercises red tband hip 3 way 10 each      Knee/Hip Exercises: Seated   Clamshell with TheraBand Blue   10 BIL,SL 10X   Marching Strengthening;Both;2 sets;10 reps   blue tband   Sit to Sand 2 sets;10 reps;without UE support   on airex, wt ball OH and chest press                      PT Short Term Goals - 03/28/21 1117       PT SHORT TERM GOAL #1   Title Pt will be independent with initial HEP.    Status Achieved               PT Long Term Goals - 04/25/21 1606       PT LONG  TERM GOAL #1   Title Pt will be independent with advanced HEP.    Status Partially Met      PT LONG TERM GOAL #2   Title Pt will increase FOTO to 65%.    Status Partially Met      PT LONG TERM GOAL #3   Title Pt will report being able to walk for at least 30 minutes without increased pain to allow her to go out in the community with her group.    Baseline 10 min    Status Partially Met      PT LONG TERM GOAL #4   Title Pt will demonstrate good posture and body mechanics during functional activities.    Status Partially Met                   Plan - 04/27/21 1605     Clinical Impression Statement focus on back/core and LE ex. some increased LBP esp on RT with standing ex- cued to engage core as she is increased lumbar lordosis. BIL hip weakness left> RT    PT Treatment/Interventions ADLs/Self Care Home Management;Cryotherapy;Electrical Stimulation;Iontophoresis 44m/ml Dexamethasone;Moist Heat;Traction;Ultrasound;Gait training;Stair training;Functional mobility training;Therapeutic activities;Therapeutic exercise;Balance training;Neuromuscular re-education;Patient/family education;Manual techniques;Passive range of motion;Dry needling;Taping;Vasopneumatic Device;Spinal Manipulations;Joint Manipulations    PT Next Visit Plan assess knee  EVAL ( pt does have new script) vs add to current order?             Patient will benefit from skilled therapeutic intervention in order to improve the following deficits and impairments:  Difficulty walking, Increased muscle spasms, Obesity, Decreased activity tolerance, Pain, Impaired flexibility, Improper body mechanics, Decreased strength  Visit Diagnosis: Muscle weakness (generalized)  Difficulty in walking, not elsewhere classified     Problem List Patient Active Problem List   Diagnosis Date Noted   Primary osteoarthritis of both knees 04/18/2021   Dizziness 12/02/2020   Nonspecific abnormal electrocardiogram (ECG) (EKG) 12/02/2020   UTI (urinary tract infection)    Thyroid disease    Hypertension    Hyperlipidemia    HA (headache)    GERD (gastroesophageal reflux disease)    Colon polyps    Chicken pox    Arthritis    Allergy    Morbid obesity ([place] 11/30/2020   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

## 2021-05-02 ENCOUNTER — Ambulatory Visit: Payer: Medicare Other | Admitting: Physical Therapy

## 2021-05-02 ENCOUNTER — Other Ambulatory Visit: Payer: Self-pay

## 2021-05-02 ENCOUNTER — Encounter: Payer: Self-pay | Admitting: Physical Therapy

## 2021-05-02 DIAGNOSIS — M5442 Lumbago with sciatica, left side: Secondary | ICD-10-CM

## 2021-05-02 DIAGNOSIS — M6281 Muscle weakness (generalized): Secondary | ICD-10-CM | POA: Diagnosis not present

## 2021-05-02 DIAGNOSIS — G8929 Other chronic pain: Secondary | ICD-10-CM

## 2021-05-02 DIAGNOSIS — R252 Cramp and spasm: Secondary | ICD-10-CM

## 2021-05-02 DIAGNOSIS — R262 Difficulty in walking, not elsewhere classified: Secondary | ICD-10-CM

## 2021-05-02 NOTE — Patient Instructions (Addendum)
Access Code: [email or web address] URL: [email or web address] Date: 05/02/2021 Prepared by: Tricia Dodson [name]  Lumbar Exercises Supine Hamstring Stretch - 2 x daily - 7 x weekly - 1 sets - 3 reps - 20 sec hold Supine Lower Trunk Rotation - 1 x daily - 7 x weekly - 3 sets - 10 reps Supine Figure 4 Piriformis Stretch - 2 x daily - 7 x weekly - 1 sets - 3 reps - 20 sec hold Supine 90/90 Abdominal Bracing - 1 x daily - 7 x weekly - 3 sets - 10 sec hold Standing Shoulder Row with Anchored Resistance - 1-2 x daily - 7 x weekly - 2 sets - 10 reps  Knee/Hip Exercises Sidelying Hip Abduction - 1 x daily - 7 x weekly - 3 sets - 10 reps Supine Straight Leg Raises - 1 x daily - 7 x weekly - 2 sets - 8 reps Prone Hamstring Curl with Anchored Resistance - 1 x daily - 7 x weekly - 2 sets - 10 reps Seated Hip Flexor Stretch - 1 x daily - 7 x weekly - 2 sets - 20 sec hold

## 2021-05-02 NOTE — Therapy (Signed)
[place]. [place], [place], [address] Phone: [phone]   Fax:  [phone]  Physical Therapy Treatment and Re-Certification  Patient Details  Name: Tricia Dodson MRN: [id number] Date of Birth: [birth year] Referring Provider (PT): Rosemarie Ax, MD   Encounter Date: 05/02/2021   PT End of Session - 05/02/21 1058     Visit Number 12    Date for PT Re-Evaluation 06/27/21    PT Start Time 1016    PT Stop Time 1058    PT Time Calculation (min) 42 min    Activity Tolerance Patient tolerated treatment well    Behavior During Therapy [facility] for tasks assessed/performed             Past Medical History:  Diagnosis Date   Allergy    Arthritis    Carpal tunnel syndrome, bilateral 11/04/2020   Both wrists.    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   Subjective Assessment - 05/02/21 1021     Subjective Evaluated pt for knee pain as this may also be contributing to her LBP. Not having any sciatica today. Pt reports no knee pain in sitting. Pt notes anterior/medial knee pain with walking or stairs. Pt notes her knee can go out on her. L is worse  than R.    Pertinent History HTN, L knee torn meniscus    Limitations Walking;Standing    How long can you sit comfortably? Depends the seating surface, but her reclincer works best    How long can you stand comfortably? about 10-15 min    How long can you walk comfortably? 100 yards    Patient Stated Goals I want to be able to go out walking again and go on outings with community group.    Currently in Pain? No/denies   At most 5/10 during activity   Pain Location Knee    Pain Orientation Left    Pain Descriptors / Indicators Aching;Sore    Pain Type Acute pain                [facility] PT Assessment - 05/02/21 0001       Assessment   Medical Diagnosis Left Knee Pain    Referring Provider (PT) Rosemarie Ax, MD    Prior Therapy Currently for her lumbar/sciatica      Precautions   Precautions None      Restrictions   Weight Bearing Restrictions No      Balance Screen   Has  the patient fallen in the past 6 months No      [place] residence    Living Arrangements Alone    Type of [place] Access Level entry    Home Layout Two level;Bed/bath upstairs    Alternate Level Stairs-Number of Steps 18    Alternate Level Stairs-Rails Right;Left    Home Equipment None      Prior Function   Level of Independence Independent    Vocation Retired      Observation/Other Assessments   Focus on Therapeutic Outcomes (FOTO)  Needs reassessment      Strength   Strength Assessment Site Hip;Knee    Right/Left Hip Right;Left    Right Hip Flexion 5/5    Right Hip Extension 4+/5    Right Hip External Rotation  4+/5    Right Hip Internal Rotation 4+/5    Right Hip ABduction 5/5    Left Hip Flexion 3+/5    Left Hip Extension 4-/5    Left Hip External Rotation 4+/5    Left Hip Internal Rotation 3+/5    Left Hip ABduction 4-/5    Right/Left Knee Right;Left    Right Knee Flexion 4+/5    Right Knee Extension 5/5    Left Knee Flexion  3+/5    Left Knee Extension 3+/5   Barely able to tolerate 10 SLRs     Palpation   Patella mobility WFL    Palpation comment pes anserine tenderness, medial patella tenderness      Special Tests    Special Tests Knee Special Tests;Meniscus Tests    Other special tests Knee varus & valgus testing [facility]    Knee Special tests  Patellofemoral Grind Test (Clarke's Sign);McConnell Test    Meniscus Tests McMurray Test      McConnell Test   Findings Not tested      Patellofemoral Grind test (Clark's Sign)   Findings Negative      McMurray Test   Findings Negative    Comments tighter ER on L      Transfers   Five time sit to stand comments  13 sec without UEs   started to feel it after 3 reps; 15 sec with R leg back in staggered stance; 15 sec with L leg back in staggered stance                                   PT Education - 05/02/21 1159     Education Details Discussed exam findings and HEP updates.    Person(s) Educated Patient    Methods Explanation;Demonstration;Tactile cues;Verbal cues    Comprehension Verbalized understanding;Returned demonstration              PT Short Term Goals - 03/28/21 1117       PT SHORT TERM GOAL #1   Title Pt will be independent with initial HEP.    Status Achieved               PT Long Term Goals - 05/02/21 1211       PT LONG TERM GOAL #1   Title Pt will be independent with advanced HEP.    Time 8    Period Weeks    Status On-going    Target Date 06/27/21      PT LONG TERM GOAL #2   Title Pt will increase FOTO  to 65%.    Time 8    Period Weeks    Status On-going    Target Date 06/27/21      PT LONG TERM GOAL #3   Title Pt will report being able to walk for at least 30 minutes without increased pain to allow her to go out in the community with her group.    Baseline 10 min    Time 8    Period Weeks    Status On-going    Target Date 06/27/21      PT LONG TERM GOAL #4   Title Pt will  demonstrate good posture and body mechanics during functional activities.    Status Partially Met      PT LONG TERM GOAL #5   Title Pt will be able to ascend/descend at least 10 steps with </=2/10 pain    Baseline 5/10 max pain    Time 8    Period Weeks    Status New    Target Date 06/27/21                   Plan - 05/02/21 1159     Clinical Impression Statement Evaluated pt's knee as this may also be related to her LBP. Assessment found to have L>R hip weakness -- especially in hip flexors, hamstrings and hip extensors. Pt is tender to palpation along hamstring insertion at pes anserine and along medial knee joint line (reports this is the most common site of pain for her with walking and stairs). Only reports mild tenderness with patellar tendon palpation. No knee ligamental laxity noted with testing; however, may not rule out possibility of medial meniscal involvement as her knee can also buckle/catch at times -- consider further imaging of continued PT treatment does not improve her pain. Pt would benefit from PT intervention at this time to address her hip/core/pelvic instability and hamstring/quad weakness likely contributing to both her back and knee pain.    Personal Factors and Comorbidities Age;Comorbidity 2    Comorbidities HTN, L knee meniscus tear    Examination-Activity Limitations Locomotion Level;Stairs    Examination-Participation Restrictions Community Activity    Rehab Potential Good    PT Frequency 2x / week    PT Duration 8 weeks    PT Treatment/Interventions ADLs/Self Care Home Management;Cryotherapy;Electrical Stimulation;Iontophoresis 77m/ml Dexamethasone;Moist Heat;Traction;Ultrasound;Gait training;Stair training;Functional mobility training;Therapeutic activities;Therapeutic exercise;Balance training;Neuromuscular re-education;Patient/family education;Manual techniques;Passive range of motion;Dry needling;Taping;Vasopneumatic Device;Spinal Manipulations;Joint  Manipulations;Aquatic Therapy    PT Next Visit Plan Please continue core/pelvic/hip strengthening. Focus on hamstring and quads.    PT Home Exercise Plan Access Code: [email or web address]   Consulted and Agree with Plan of Care Patient             Patient will benefit from skilled therapeutic intervention in order to improve the following deficits and impairments:  Difficulty walking, Increased muscle spasms, Obesity, Decreased activity tolerance, Pain, Impaired flexibility, Improper body mechanics, Decreased strength  Visit Diagnosis: Chronic pain of left knee  Muscle weakness (generalized)  Difficulty in walking, not elsewhere classified  Cramp and spasm  Acute left-sided low back pain with left-sided sciatica     Problem List Patient Active Problem List   Diagnosis Date Noted   Primary osteoarthritis of both knees 04/18/2021   Dizziness 12/02/2020   Nonspecific abnormal electrocardiogram (ECG) (EKG) 12/02/2020   UTI (urinary tract infection)    Thyroid disease    Hypertension    Hyperlipidemia    HA (headache)    GERD (gastroesophageal  reflux disease)    Colon polyps    Chicken pox    Arthritis    Allergy    Morbid obesity ([place]) 11/30/2020   Retrognathia 11/30/2020   CPAP (continuous positive airway pressure) dependence 11/30/2020   OSA on CPAP 11/30/2020   CPAP use counseling 11/04/2020   Carpal tunnel syndrome, bilateral 11/04/2020   Plantar fasciitis, bilateral 11/04/2020   Torn ligament 11/04/2020   History of vein stripping 11/04/2020   Shingles [address] [place], PT, DPT 05/02/2021, 12:24 PM  [place]. [place], [place], [address] Phone: [phone]   Fax:  [phone]  Name: Tricia Dodson MRN: [id number] Date of Birth: 03-04-1951

## 2021-05-04 ENCOUNTER — Ambulatory Visit: Payer: Medicare Other | Admitting: Physical Therapy

## 2021-05-04 ENCOUNTER — Ambulatory Visit: Payer: Self-pay | Admitting: Adult Health

## 2021-05-09 ENCOUNTER — Ambulatory Visit: Payer: Medicare Other | Attending: Family | Admitting: Physical Therapy

## 2021-05-09 DIAGNOSIS — R252 Cramp and spasm: Secondary | ICD-10-CM | POA: Insufficient documentation

## 2021-05-09 DIAGNOSIS — G8929 Other chronic pain: Secondary | ICD-10-CM | POA: Insufficient documentation

## 2021-05-09 DIAGNOSIS — M25562 Pain in left knee: Secondary | ICD-10-CM | POA: Insufficient documentation

## 2021-05-09 DIAGNOSIS — M6281 Muscle weakness (generalized): Secondary | ICD-10-CM | POA: Insufficient documentation

## 2021-05-09 DIAGNOSIS — M5442 Lumbago with sciatica, left side: Secondary | ICD-10-CM | POA: Insufficient documentation

## 2021-05-09 DIAGNOSIS — R262 Difficulty in walking, not elsewhere classified: Secondary | ICD-10-CM | POA: Insufficient documentation

## 2021-05-11 ENCOUNTER — Ambulatory Visit: Payer: Medicare Other | Admitting: Physical Therapy

## 2021-05-11 ENCOUNTER — Other Ambulatory Visit: Payer: Self-pay

## 2021-05-11 DIAGNOSIS — M5442 Lumbago with sciatica, left side: Secondary | ICD-10-CM | POA: Diagnosis present

## 2021-05-11 DIAGNOSIS — M25562 Pain in left knee: Secondary | ICD-10-CM

## 2021-05-11 DIAGNOSIS — R262 Difficulty in walking, not elsewhere classified: Secondary | ICD-10-CM

## 2021-05-11 DIAGNOSIS — M6281 Muscle weakness (generalized): Secondary | ICD-10-CM

## 2021-05-11 DIAGNOSIS — R252 Cramp and spasm: Secondary | ICD-10-CM | POA: Diagnosis present

## 2021-05-11 DIAGNOSIS — G8929 Other chronic pain: Secondary | ICD-10-CM

## 2021-05-11 NOTE — Therapy (Signed)
[place]. [place], [place], [address] Phone: [phone]   Fax:  [phone]  Physical Therapy Treatment  Patient Details  Name: Tricia Dodson MRN: [id number] Date of Birth: [birth year] Referring Provider (PT): Rosemarie Ax, MD   Encounter Date: 05/11/2021   PT End of Session - 05/11/21 1024     Visit Number 13    Date for PT Re-Evaluation 06/27/21    PT Start Time 1013    PT Stop Time 1100    PT Time Calculation (min) 47 min             Past Medical History:  Diagnosis Date   Allergy    Arthritis    Carpal tunnel syndrome, bilateral 11/04/2020   Both wrists.    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   Subjective Assessment - 05/11/21 1016     Subjective had to cancel last 2 appts. not as good as I was busy with party palnning- sciatica came back and "stitch " in my side. some better with ex    Currently in Pain? Yes    Pain Score 4     Pain Location Back    Pain Orientation Left;Right                               [facility] Adult PT Treatment/Exercise - 05/11/21 0001       Lumbar Exercises: Aerobic   Nustep  level 5 x 6 minutes      Lumbar Exercises: Machines for Strengthening   Cybex Lumbar Extension black tband 20 x   plus trunk flex   Other Lumbar Machine Exercise seated row and lats 20 # 2x10      Lumbar Exercises: Standing   Other Standing Lumbar Exercises cable AR press and circles 10 each. rotation 10 each      Lumbar Exercises: Supine   Ab Set 15 reps;3 seconds    Bridge Non-compliant;15 reps;3 seconds   plus KTC and obl   Other Supine Lumbar Exercises resisted trunk rotation 15 x each with tband      Knee/Hip Exercises: Machines for Strengthening   Cybex Knee Extension 15# BIL 12 x, 10 # SL 10 each    Cybex Knee Flexion 25# 15 x BIL, 15# SL 12 each      Knee/Hip Exercises: Seated   Ball Squeeze 20x    Clamshell with TheraBand Blue   20x  Marching Strengthening;Both;20 reps   blue tband                      PT Short Term Goals - 03/28/21 1117       PT SHORT TERM GOAL #1   Title Pt will be independent with initial HEP.    Status Achieved               PT Long Term Goals - 05/11/21 1023       PT LONG TERM GOAL #1   Title Pt will be independent with advanced HEP.    Status Partially Met      PT LONG TERM GOAL #2   Title Pt will increase FOTO to 65%.    Status On-going      PT LONG TERM GOAL #3   Title Pt will report being able to walk for at least 30 minutes without increased pain to allow her to go out in the community with her group.    Baseline 10 min    Status Partially Met      PT LONG TERM GOAL #4   Title Pt will demonstrate good posture and body mechanics during functional activities.    Status Partially Met      PT LONG TERM GOAL #5   Title Pt will be able to ascend/descend at least 10 steps with </=2/10 pain    Status Partially Met                   Plan - 05/11/21 1053     Clinical Impression Statement worked on core strength with added rotation- NE on " sticth in side" some knee pain. cued to engage abdominals esp  since pt is in increased lumbar lordosis. progress knee strengtheing/ no changes in goals this week.    PT Treatment/Interventions ADLs/Self Care Home Management;Cryotherapy;Electrical Stimulation;Iontophoresis 93m/ml Dexamethasone;Moist Heat;Traction;Ultrasound;Gait training;Stair training;Functional mobility training;Therapeutic activities;Therapeutic exercise;Balance training;Neuromuscular re-education;Patient/family education;Manual techniques;Passive range of motion;Dry needling;Taping;Vasopneumatic Device;Spinal Manipulations;Joint Manipulations;Aquatic Therapy    PT Next Visit Plan Please continue core/pelvic/hip strengthening. Focus on hamstring and quads.             Patient will benefit from skilled therapeutic intervention in order to improve the following deficits and impairments:  Difficulty walking, Increased muscle spasms, Obesity, Decreased activity tolerance, Pain, Impaired flexibility, Improper body mechanics, Decreased strength  Visit Diagnosis: Chronic pain of left knee  Muscle weakness (generalized)  Difficulty in walking, not elsewhere classified  Cramp and spasm  Acute left-sided low back pain with left-sided sciatica     Problem List Patient Active Problem List   Diagnosis Date Noted   Primary osteoarthritis of both knees 04/18/2021   Dizziness 12/02/2020   Nonspecific abnormal electrocardiogram (ECG) (EKG) 12/02/2020   UTI (urinary tract infection)    Thyroid disease    Hypertension    Hyperlipidemia    HA (headache)    GERD (gastroesophageal reflux disease)    Colon polyps    Chicken pox    Arthritis    Allergy    Morbid obesity ([place] 11/30/2020   Retrognathia 11/30/2020   CPAP (continuous positive airway pressure) dependence 11/30/2020   OSA on CPAP 11/30/2020   CPAP use counseling 11/04/2020   Carpal tunnel syndrome, bilateral 11/04/2020   Plantar fasciitis, bilateral 11/04/2020   Torn ligament 11/04/2020   History of vein stripping  11/04/2020   Shingles 2019    Tricia Dodson,[name], PTA 05/11/2021, 10:56 AM  [place]  [place]. [place], [place], [address] Phone: [phone]   Fax:  [phone]  Name: Tricia Dodson MRN: [id number] Date of Birth: 1951/05/17

## 2021-05-16 ENCOUNTER — Other Ambulatory Visit: Payer: Self-pay

## 2021-05-16 ENCOUNTER — Encounter: Payer: Self-pay | Admitting: Physical Therapy

## 2021-05-16 ENCOUNTER — Ambulatory Visit: Payer: Medicare Other | Admitting: Physical Therapy

## 2021-05-16 DIAGNOSIS — R262 Difficulty in walking, not elsewhere classified: Secondary | ICD-10-CM

## 2021-05-16 DIAGNOSIS — M25562 Pain in left knee: Secondary | ICD-10-CM | POA: Diagnosis not present

## 2021-05-16 DIAGNOSIS — M6281 Muscle weakness (generalized): Secondary | ICD-10-CM

## 2021-05-16 DIAGNOSIS — R252 Cramp and spasm: Secondary | ICD-10-CM

## 2021-05-16 DIAGNOSIS — M5442 Lumbago with sciatica, left side: Secondary | ICD-10-CM

## 2021-05-16 DIAGNOSIS — G8929 Other chronic pain: Secondary | ICD-10-CM

## 2021-05-16 NOTE — Therapy (Signed)
[place], [place], [address] Phone: [phone]   Fax:  [phone]  Physical Therapy Treatment  Patient Details  Name: Tricia Dodson MRN: [id number] Date of Birth: 1951-01-23 Referring Provider (PT): Rosemarie Ax, MD   Encounter Date: 05/16/2021   PT End of Session - 05/16/21 1007     Visit Number 14    Date for PT Re-Evaluation 06/27/21    PT Start Time 0932    PT Stop Time 1014    PT Time Calculation (min) 42 min    Activity Tolerance Patient tolerated treatment well    Behavior During Therapy [facility] for tasks assessed/performed             Past Medical History:  Diagnosis Date   Allergy    Arthritis    Carpal tunnel syndrome, bilateral 11/04/2020   Both wrists.    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   Subjective Assessment - 05/16/21 0930     Subjective Patient reports sciatica is much bvetter, low back and buttocks have trigger points.    Pertinent History HTN, L knee torn meniscus    Limitations Walking;Standing    Currently in Pain? Yes    Pain Score 3     Pain Location Knee    Pain Orientation Left    Pain  Descriptors / Indicators Sore    Pain Type Chronic pain    Pain Onset More than a month ago    Pain Frequency Intermittent                               [facility] Adult PT Treatment/Exercise - 05/16/21 0001       Lumbar Exercises: Stretches   Passive Hamstring Stretch Right;Left;3 reps;10 seconds    Passive Hamstring Stretch Limitations Strap, moved leg into add and abd as well, 3 x 10 seconds    ITB Stretch Right;Left;1 rep;20 seconds    Figure 4 Stretch 1 rep;30 seconds      Lumbar Exercises: Aerobic   Tread Mill 2.0 x 5 minutes.      Lumbar Exercises: Supine   Bridge Non-compliant;10 reps;2 seconds    Bridge with Ball Squeeze Non-compliant;10 reps;2 seconds    Other Supine Lumbar Exercises Bridge with resisted abd against theraband, 10 reps.                     PT Education - 05/16/21 1005  Education Details Updated HEP to include core strength. Educated to rotate exercises.    Person(s) Educated Patient    Methods Explanation;Demonstration;Handout    Comprehension Verbalized understanding;Returned demonstration              PT Short Term Goals - 05/16/21 0951       PT SHORT TERM GOAL #1   Title Pt will be independent with updated HEP.    Baseline Added core strength exercises.    Time 4    Period Weeks    Status New    Target Date 06/13/21               PT Long Term Goals - 05/11/21 1023       PT LONG TERM GOAL #1   Title Pt will be independent with advanced HEP.    Status Partially Met      PT LONG TERM GOAL #2   Title Pt will increase FOTO to 65%.    Status On-going      PT LONG TERM GOAL #3   Title Pt will report being able to walk for at least 30 minutes without increased pain to allow her to go out in the community with her group.    Baseline 10 min    Status Partially Met      PT LONG TERM GOAL #4   Title Pt will demonstrate good posture and body mechanics during functional activities.    Status  Partially Met      PT LONG TERM GOAL #5   Title Pt will be able to ascend/descend at least 10 steps with </=2/10 pain    Status Partially Met                   Plan - 05/16/21 1008     Clinical Impression Statement Patient reports progress, with less pain noted. Therapist introduced multiple core strengthening exercises, updated HEP to include them.    Stability/Clinical Decision Making Evolving/Moderate complexity    Clinical Decision Making Moderate    Rehab Potential Good    PT Frequency 2x / week    PT Duration 6 weeks    PT Treatment/Interventions ADLs/Self Care Home Management;Cryotherapy;Electrical Stimulation;Iontophoresis 24m/ml Dexamethasone;Moist Heat;Traction;Ultrasound;Gait training;Stair training;Functional mobility training;Therapeutic activities;Therapeutic exercise;Balance training;Neuromuscular re-education;Patient/family education;Manual techniques;Passive range of motion;Dry needling;Taping;Vasopneumatic Device;Spinal Manipulations;Joint Manipulations;Aquatic Therapy    PT Next Visit Plan Please continue core/pelvic/hip strengthening. Focus on hamstring and quads.    PT Home Exercise Plan [email or web address]   Consulted and Agree with Plan of Care Patient             Patient will benefit from skilled therapeutic intervention in order to improve the following deficits and impairments:  Difficulty walking, Increased muscle spasms, Obesity, Decreased activity tolerance, Pain, Impaired flexibility, Improper body mechanics, Decreased strength  Visit Diagnosis: Chronic pain of left knee  Muscle weakness (generalized)  Difficulty in walking, not elsewhere classified  Cramp and spasm  Acute left-sided low back pain with left-sided sciatica     Problem List Patient Active Problem List   Diagnosis Date Noted   Primary osteoarthritis of both knees 04/18/2021   Dizziness 12/02/2020   Nonspecific abnormal electrocardiogram (ECG) (EKG) 12/02/2020   UTI (urinary  tract infection)    Thyroid disease    Hypertension    Hyperlipidemia    HA (headache)    GERD (gastroesophageal reflux disease)    Colon polyps    Chicken pox    Arthritis    Allergy  Morbid obesity ([place]) 11/30/2020   Retrognathia 11/30/2020   CPAP (continuous positive airway pressure) dependence 11/30/2020   OSA on CPAP 11/30/2020   CPAP use counseling 11/04/2020   Carpal tunnel syndrome, bilateral 11/04/2020   Plantar fasciitis, bilateral 11/04/2020   Torn ligament 11/04/2020   History of vein stripping 11/04/2020   Shingles 2019    Marcelina Morel, DPT 05/16/2021, 10:15 AM  [place]. [place], [place], [address] Phone: [phone]   Fax:  [phone]  Name: Bettyann Birchler MRN: [id number] Date of Birth: Jul 23, 1950

## 2021-05-16 NOTE — Patient Instructions (Signed)
Access Code: [email or web address] URL: [email or web address] Date: 05/16/2021 Prepared by: Oley Balm  Exercises Supine Hamstring Stretch - 2 x daily - 7 x weekly - 1 sets - 3 reps - 20 sec hold Supine Lower Trunk Rotation - 1 x daily - 7 x weekly - 3 sets - 10 reps Supine Figure 4 Piriformis Stretch - 2 x daily - 7 x weekly - 1 sets - 3 reps - 20 sec hold Supine 90/90 Abdominal Bracing - 1 x daily - 7 x weekly - 3 sets - 10 sec hold Standing Shoulder Row with Anchored Resistance - 1-2 x daily - 7 x weekly - 2 sets - 10 reps Sidelying Hip Abduction - 1 x daily - 7 x weekly - 3 sets - 10 reps Supine Straight Leg Raises - 1 x daily - 7 x weekly - 2 sets - 8 reps Prone Hamstring Curl with Anchored Resistance - 1 x daily - 7 x weekly - 2 sets - 10 reps Seated Hip Flexor Stretch - 1 x daily - 7 x weekly - 2 sets - 20 sec hold Supine Bridge - 1 x daily - 7 x weekly - 3 sets - 10 reps Supine Hip Adduction Isometric with Ball - 1 x daily - 7 x weekly - 3 sets - 10 reps Supine Bridge with Resistance Band - 1 x daily - 7 x weekly - 3 sets - 10 reps Standing Mini Rotation with Resistance - 1 x daily - 7 x weekly - 3 sets - 10 reps

## 2021-05-17 ENCOUNTER — Ambulatory Visit: Payer: Medicare Other | Admitting: Family Medicine

## 2021-05-18 ENCOUNTER — Ambulatory Visit: Payer: Medicare Other | Admitting: Physical Therapy

## 2021-05-18 ENCOUNTER — Encounter: Payer: Self-pay | Admitting: Physical Therapy

## 2021-05-18 ENCOUNTER — Other Ambulatory Visit: Payer: Self-pay

## 2021-05-18 DIAGNOSIS — R252 Cramp and spasm: Secondary | ICD-10-CM

## 2021-05-18 DIAGNOSIS — G8929 Other chronic pain: Secondary | ICD-10-CM

## 2021-05-18 DIAGNOSIS — M5442 Lumbago with sciatica, left side: Secondary | ICD-10-CM

## 2021-05-18 DIAGNOSIS — M25562 Pain in left knee: Secondary | ICD-10-CM

## 2021-05-18 DIAGNOSIS — M6281 Muscle weakness (generalized): Secondary | ICD-10-CM

## 2021-05-18 DIAGNOSIS — R262 Difficulty in walking, not elsewhere classified: Secondary | ICD-10-CM

## 2021-05-18 NOTE — Therapy (Signed)
[place]. [place], [place], [address] Phone: [phone]   Fax:  [phone]  Physical Therapy Treatment  Patient Details  Name: Tricia Dodson MRN: [id number] Date of Birth: Aug 26, 1950 Referring Provider (PT): Rosemarie Ax, MD   Encounter Date: 05/18/2021   PT End of Session - 05/18/21 1008     Visit Number 15    Date for PT Re-Evaluation 06/27/21    PT Start Time 0933    PT Stop Time 1015    PT Time Calculation (min) 42 min    Activity Tolerance Patient tolerated treatment well    Behavior During Therapy [facility] for tasks assessed/performed             Past Medical History:  Diagnosis Date   Allergy    Arthritis    Carpal tunnel syndrome, bilateral 11/04/2020   Both wrists.    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   Subjective Assessment - 05/18/21 0936     Subjective Patient  reports sciatica is improved, continues to feel the trigger points.    Pertinent History HTN, L knee torn meniscus    Limitations Walking;Standing    How long can you sit comfortably? Depends the seating surface, but her reclincer works best    How long can  you stand comfortably? about 10-15 min    How long can you walk comfortably? 100 yards    Patient Stated Goals I want to be able to go out walking again and go on outings with community group.    Currently in Pain? Yes    Pain Location Buttocks    Pain Orientation Right    Pain Descriptors / Indicators Aching    Pain Type Chronic pain    Pain Onset More than a month ago    Pain Frequency Intermittent                               [facility] Adult PT Treatment/Exercise - 05/18/21 0001       Lumbar Exercises: Stretches   Other Lumbar Stretch Exercise QL stretch with rolling stool 3 x 20 sec      Lumbar Exercises: Aerobic   Nustep level 5 x 6 minutes      Knee/Hip Exercises: Stretches   Active Hamstring Stretch Both;2 reps;10 seconds    ITB Stretch Both;2 reps;20 seconds    Piriformis Stretch Both;2 reps;20 seconds      Knee/Hip  Exercises: Supine   Bridges Both;5 reps    Other Supine Knee/Hip Exercises clamshell against therapband resistance and ball squeezes in supine 2 x 5 reps each      Manual Therapy   Manual Therapy Soft tissue mobilization    Manual therapy comments B lumbar and piriformis tightness    Soft tissue mobilization STM & TPR L glute max & gute med                     PT Education - 05/18/21 1008     Education Details UPdated HEP.    Person(s) Educated Patient    Methods Demonstration;Explanation;Handout    Comprehension Verbalized understanding;Returned demonstration              PT Short Term Goals - 05/16/21 0951       PT SHORT TERM GOAL #1   Title Pt will be independent with updated HEP.    Baseline Added core strength exercises.    Time 4    Period Weeks    Status New    Target Date 06/13/21               PT Long Term Goals - 05/11/21 1023       PT LONG TERM GOAL #1   Title Pt will be independent with advanced HEP.    Status Partially Met      PT LONG TERM GOAL #2   Title Pt will increase FOTO to  65%.    Status On-going      PT LONG TERM GOAL #3   Title Pt will report being able to walk for at least 30 minutes without increased pain to allow her to go out in the community with her group.    Baseline 10 min    Status Partially Met      PT LONG TERM GOAL #4   Title Pt will demonstrate good posture and body mechanics during functional activities.    Status Partially Met      PT LONG TERM GOAL #5   Title Pt will be able to ascend/descend at least 10 steps with </=2/10 pain    Status Partially Met                   Plan - 05/18/21 1250     Clinical Impression Statement Patient noted multiple TP sup and inferior to illiac crest B. Therapsit provided STM, TPT to decreased muscle tension, followed by stretching and updated HEP for strength of lower turnk and hips. She may benefit from additional DN as she reports that she responded well to this on previous treatment.    Personal Factors and Comorbidities Age;Comorbidity 2    Comorbidities HTN, L knee meniscus tear    Examination-Activity Limitations Locomotion Level;Stairs    Examination-Participation Restrictions Community Activity    Stability/Clinical Decision Making Evolving/Moderate complexity    Clinical Decision Making Moderate    Rehab Potential Good    PT Frequency 2x / week    PT Duration 6 weeks    PT Treatment/Interventions ADLs/Self Care Home Management;Cryotherapy;Electrical Stimulation;Iontophoresis 61m/ml Dexamethasone;Moist Heat;Traction;Ultrasound;Gait training;Stair training;Functional mobility training;Therapeutic activities;Therapeutic exercise;Balance training;Neuromuscular re-education;Patient/family education;Manual techniques;Passive range of motion;Dry needling;Taping;Vasopneumatic Device;Spinal Manipulations;Joint Manipulations;Aquatic Therapy    PT Next Visit Plan STM, DN, core and hip stabilization.    PT Home Exercise Plan [email or web address]    Consulted and Agree with Plan of Care Patient  Patient will benefit from skilled therapeutic intervention in order to improve the following deficits and impairments:  Difficulty walking, Increased muscle spasms, Obesity, Decreased activity tolerance, Pain, Impaired flexibility, Improper body mechanics, Decreased strength  Visit Diagnosis: Chronic pain of left knee  Muscle weakness (generalized)  Difficulty in walking, not elsewhere classified  Cramp and spasm  Acute left-sided low back pain with left-sided sciatica     Problem List Patient Active Problem List   Diagnosis Date Noted   Primary osteoarthritis of both knees 04/18/2021   Dizziness 12/02/2020   Nonspecific abnormal electrocardiogram (ECG) (EKG) 12/02/2020   UTI (urinary tract infection)    Thyroid disease    Hypertension    Hyperlipidemia    HA (headache)    GERD (gastroesophageal reflux disease)    Colon polyps    Chicken pox    Arthritis    Allergy    Morbid obesity ([place]) 11/30/2020   Retrognathia 11/30/2020   CPAP (continuous positive airway pressure) dependence 11/30/2020   OSA on CPAP 11/30/2020   CPAP use counseling 11/04/2020   Carpal tunnel syndrome, bilateral 11/04/2020   Plantar fasciitis, bilateral 11/04/2020   Torn ligament 11/04/2020   History of vein stripping 11/04/2020   Shingles 2019    Marcelina Morel, DPT 05/18/2021, 12:54 PM  [place]. [place], [place], [address] Phone: [phone]   Fax:  [phone]  Name: Cecille Mcclusky MRN: [id number] Date of Birth: 1950-08-11

## 2021-05-18 NOTE — Patient Instructions (Signed)
Access Code: [email or web address] URL: [email or web address] Date: 05/18/2021 Prepared by: Oley Balm  Exercises Supine Hip Adduction Isometric with [employer] - 1 x daily - 7 x weekly - 2 sets - 10 reps Hooklying Clamshell with Resistance - 1 x daily - 7 x weekly - 2 sets - 10 reps Supine Bridge - 1 x daily - 7 x weekly - 2 sets - 10 reps

## 2021-05-20 ENCOUNTER — Other Ambulatory Visit: Payer: Self-pay | Admitting: Family

## 2021-05-23 ENCOUNTER — Ambulatory Visit: Payer: Medicare Other | Admitting: Physical Therapy

## 2021-05-24 ENCOUNTER — Encounter: Payer: Self-pay | Admitting: Family

## 2021-05-24 ENCOUNTER — Other Ambulatory Visit: Payer: Self-pay | Admitting: Family

## 2021-05-24 MED ORDER — OMEPRAZOLE 40 MG PO CPDR: 40.0000 mg | DELAYED_RELEASE_CAPSULE | Freq: Two times a day (BID) | ORAL | 3 refills | Status: DC

## 2021-05-25 ENCOUNTER — Ambulatory Visit: Payer: Medicare Other | Admitting: Physical Therapy

## 2021-05-26 ENCOUNTER — Ambulatory Visit: Payer: Medicare Other | Admitting: Physical Therapy

## 2021-06-05 ENCOUNTER — Other Ambulatory Visit: Payer: Self-pay | Admitting: Family

## 2021-06-08 ENCOUNTER — Other Ambulatory Visit: Payer: Self-pay

## 2021-06-08 ENCOUNTER — Ambulatory Visit: Payer: Medicare Other | Attending: Family | Admitting: Physical Therapy

## 2021-06-08 DIAGNOSIS — R252 Cramp and spasm: Secondary | ICD-10-CM | POA: Insufficient documentation

## 2021-06-08 DIAGNOSIS — M6281 Muscle weakness (generalized): Secondary | ICD-10-CM | POA: Insufficient documentation

## 2021-06-08 DIAGNOSIS — M25562 Pain in left knee: Secondary | ICD-10-CM | POA: Diagnosis present

## 2021-06-08 DIAGNOSIS — G8929 Other chronic pain: Secondary | ICD-10-CM | POA: Diagnosis present

## 2021-06-08 DIAGNOSIS — R262 Difficulty in walking, not elsewhere classified: Secondary | ICD-10-CM | POA: Insufficient documentation

## 2021-06-08 DIAGNOSIS — M5442 Lumbago with sciatica, left side: Secondary | ICD-10-CM | POA: Diagnosis present

## 2021-06-08 NOTE — Therapy (Signed)
[place]. [place], [place], [address] Phone: [phone]   Fax:  [phone]  Physical Therapy Treatment  Patient Details  Name: Tricia [place] MRN: [id number] Date of Birth: 07-25-1950 Referring Provider (PT): Rosemarie Ax, MD   Encounter Date: 06/08/2021   PT End of Session - 06/08/21 1035     Visit Number 16    Date for PT Re-Evaluation 06/27/21    PT Start Time 8786    PT Stop Time 1100    PT Time Calculation (min) 45 min             Past Medical History:  Diagnosis Date   Allergy    Arthritis    Carpal tunnel syndrome, bilateral 11/04/2020   Both wrists.    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   CPAP use counseling 11/04/2020   GERD (gastroesophageal reflux disease)    HA (headache)    History of vein stripping 11/04/2020   Right leg   Hyperlipidemia    Hypertension    Morbid obesity ([place]) 11/30/2020   OSA on CPAP 11/30/2020   Plantar fasciitis, bilateral 11/04/2020   With Bone spors   Retrognathia 11/30/2020   Shingles 2019   Thyroid disease    Torn ligament 11/04/2020   Right knee   UTI (urinary tract infection)     Past Surgical History:  Procedure Laterality Date   BILATERAL CARPAL TUNNEL RELEASE     Hemorroids removed     KNEE ARTHROSCOPY Right    Plantar fasciatis      Repaired   right hand cyst removal     vein vascular     surgery    There were no vitals filed for this visit.   Subjective Assessment - 06/08/21 1019     Subjective out visiting familiy and very actvity ,so btwn activity and palne rides back slide.back tightness and hip 4/10. knee 4/10 both increase with walking    Currently in Pain? Yes    Pain Score 4                                [facility] Adult PT Treatment/Exercise - 06/08/21 0001       Lumbar Exercises: Aerobic   Nustep level 5 x 6 minutes      Lumbar Exercises:  Machines for Strengthening   Cybex Lumbar Extension black tband 20 x    Other Lumbar Machine Exercise seated row and lats 25 # 2x10      Lumbar Exercises: Standing   Row Strengthening;15 reps;Theraband    Theraband Level (Row) Level 3 (Green)    Shoulder Extension Strengthening;Both;15 reps;Theraband    Theraband Level (Shoulder Extension) Level 3 (Green)      Lumbar Exercises: Supine   Ab Set 15 reps;3 seconds   iso with ball   Clam 20 reps   greeb tband   Bent Knee Raise 20 reps   green tband   Bridge Non-compliant;15 reps;3 seconds   obll 20 x   Bridge with [employer] Compliant;15 reps      Knee/Hip Exercises: Standing   Other Standing Knee Exercises red tband hip 3 way 10 each BIL LE      Manual Therapy   Manual Therapy Soft tissue mobilization;Passive ROM    Soft tissue mobilization STM & TPR L glute max & gute  med. ITB    Passive ROM LE and trunk                       PT Short Term Goals - 05/16/21 0951       PT SHORT TERM GOAL #1   Title Pt will be independent with updated HEP.    Baseline Added core strength exercises.    Time 4    Period Weeks    Status New    Target Date 06/13/21               PT Long Term Goals - 06/08/21 1034       PT LONG TERM GOAL #1   Title Pt will be independent with advanced HEP.    Status Partially Met      PT LONG TERM GOAL #2   Title Pt will increase FOTO to 65%.    Status On-going      PT LONG TERM GOAL #3   Title Pt will report being able to walk for at least 30 minutes without increased pain to allow her to go out in the community with her group.    Baseline able to walk on vaction but set her back    Status Partially Met      PT LONG TERM GOAL #4   Title Pt will demonstrate good posture and body mechanics during functional activities.    Status Partially Met      PT LONG TERM GOAL #5   Title Pt will be able to ascend/descend at least 10 steps with </=2/10 pain    Baseline able to do but pain  increases    Status Partially Met                   Plan - 06/08/21 1035     Clinical Impression Statement pt has been OOT for 2 weeks. pt had a very active vacation adn was pleased she could do more but pain has increased and plane ride flared back up again. resumed ex wth cuing and rest. PROM and STW to LB and Left buttock/hip for tightness and TP. ITB very tight with TP - showed pt stretching for home    PT Treatment/Interventions ADLs/Self Care Home Management;Cryotherapy;Electrical Stimulation;Iontophoresis 18m/ml Dexamethasone;Moist Heat;Traction;Ultrasound;Gait training;Stair training;Functional mobility training;Therapeutic activities;Therapeutic exercise;Balance training;Neuromuscular re-education;Patient/family education;Manual techniques;Passive range of motion;Dry needling;Taping;Vasopneumatic Device;Spinal Manipulations;Joint Manipulations;Aquatic Therapy    PT Next Visit Plan STM, DN, core and hip stabilization.             Patient will benefit from skilled therapeutic intervention in order to improve the following deficits and impairments:  Difficulty walking, Increased muscle spasms, Obesity, Decreased activity tolerance, Pain, Impaired flexibility, Improper body mechanics, Decreased strength  Visit Diagnosis: Chronic pain of left knee  Muscle weakness (generalized)  Cramp and spasm  Acute left-sided low back pain with left-sided sciatica     Problem List Patient Active Problem List   Diagnosis Date Noted   Primary osteoarthritis of both knees 04/18/2021   Dizziness 12/02/2020   Nonspecific abnormal electrocardiogram (ECG) (EKG) 12/02/2020   UTI (urinary tract infection)    Thyroid disease    Hypertension    Hyperlipidemia    HA (headache)    GERD (gastroesophageal reflux disease)    Colon polyps    Chicken pox    Arthritis    Allergy    [place] 11/30/2020   Retrognathia 11/30/2020   CPAP (continuous positive airway pressure)  dependence 11/30/2020   OSA on CPAP 11/30/2020   CPAP use counseling 11/04/2020   Carpal tunnel syndrome, bilateral 11/04/2020   Plantar fasciitis, bilateral 11/04/2020   Torn ligament 11/04/2020   History of vein stripping 11/04/2020   Shingles 2019    Tricia [place],[name], PTA 06/08/2021, 10:55 AM  [place]. [place], [place], [address] Phone: [phone]   Fax:  [phone]  Name: Tricia [place]son MRN: [id number] Date of Birth: [birth year]

## 2021-06-13 ENCOUNTER — Other Ambulatory Visit: Payer: Self-pay

## 2021-06-13 ENCOUNTER — Ambulatory Visit: Payer: Medicare Other | Admitting: Physical Therapy

## 2021-06-13 DIAGNOSIS — M25562 Pain in left knee: Secondary | ICD-10-CM

## 2021-06-13 DIAGNOSIS — G8929 Other chronic pain: Secondary | ICD-10-CM

## 2021-06-13 DIAGNOSIS — M5442 Lumbago with sciatica, left side: Secondary | ICD-10-CM

## 2021-06-13 DIAGNOSIS — M6281 Muscle weakness (generalized): Secondary | ICD-10-CM

## 2021-06-13 NOTE — Therapy (Signed)
[facility]. [place], [place], [address] Phone: [phone]   Fax:  [phone]  Physical Therapy Treatment  Patient Details  Name: Tricia Dodson MRN: [id number] Date of Birth: 17-Mar-1951 Referring Provider (PT): Rosemarie Ax, MD   Encounter Date: 06/13/2021   PT End of Session - 06/13/21 0951     Visit Number 17    Date for PT Re-Evaluation 06/27/21    PT Start Time 0920    PT Stop Time 1015    PT Time Calculation (min) 55 min             Past Medical History:  Diagnosis Date   Allergy    Arthritis    Carpal tunnel syndrome, bilateral 11/04/2020   Both wrists.    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   CPAP use counseling 11/04/2020   GERD (gastroesophageal reflux disease)    HA (headache)    History of vein stripping 11/04/2020   Right leg   Hyperlipidemia    Hypertension    Morbid obesity ([place]) 11/30/2020   OSA on CPAP 11/30/2020   Plantar fasciitis, bilateral 11/04/2020   With Bone spors   Retrognathia 11/30/2020   Shingles 2019   Thyroid disease    Torn ligament 11/04/2020   Right knee   UTI (urinary tract infection)     Past Surgical History:  Procedure Laterality Date   BILATERAL CARPAL TUNNEL RELEASE     Hemorroids removed     KNEE ARTHROSCOPY Right    Plantar fasciatis      Repaired   right hand cyst removal     vein vascular     surgery    There were no vitals filed for this visit.   Subjective Assessment - 06/13/21 0922     Subjective I think I am just trying to do too much , knees are really bothering me. back pain still really bothering me    Currently in Pain? Yes    Pain Score 5     Pain Location Knee    Pain Orientation Right                               [facility] Adult PT Treatment/Exercise - 06/13/21 0001       Lumbar Exercises: Aerobic   Nustep level 5 x 6 minutes      Knee/Hip  Exercises: Standing   Knee Flexion Strengthening;Both;15 reps   5#   Hip Flexion Stengthening;Both;15 reps;Knee bent   5#   Lateral Step Up Both;2 sets;5 sets;Hand Hold: 2;Step Height: 6"   opp leg abd   Forward Step Up Both;2 sets;5 sets;Hand Hold: 2;Step Height: 6"   opp leg ext   Walking with Sports Cord 30# 5x fwd/back,3 x each side    Other Standing Knee Exercises dead lift 10 x      Knee/Hip Exercises: Seated   Long Arc Quad Strengthening;Both;10 reps;Weights;2 sets   hold 3 sec TKE     Manual Therapy   Manual Therapy Soft tissue mobilization    Soft tissue mobilization left ITB              Trigger Point Dry Needling - 06/13/21 0001     Consent Given? Yes    Muscles Treated Back/Hip Lumbar multifidi    Piriformis Response Twitch response elicited  Lumbar multifidi Response Twitch response elicited                     PT Short Term Goals - 05/16/21 0951       PT SHORT TERM GOAL #1   Title Pt will be independent with updated HEP.    Baseline Added core strength exercises.    Time 4    Period Weeks    Status New    Target Date 06/13/21               PT Long Term Goals - 06/08/21 1034       PT LONG TERM GOAL #1   Title Pt will be independent with advanced HEP.    Status Partially Met      PT LONG TERM GOAL #2   Title Pt will increase FOTO to 65%.    Status On-going      PT LONG TERM GOAL #3   Title Pt will report being able to walk for at least 30 minutes without increased pain to allow her to go out in the community with her group.    Baseline able to walk on vaction but set her back    Status Partially Met      PT LONG TERM GOAL #4   Title Pt will demonstrate good posture and body mechanics during functional activities.    Status Partially Met      PT LONG TERM GOAL #5   Title Pt will be able to ascend/descend at least 10 steps with </=2/10 pain    Baseline able to do but pain increases    Status Partially Met                    Plan - 06/13/21 0959     Clinical Impression Statement worked on strengtheing and func ROM with cued for slow, controlled mvmt with ex . STM to left ITB that is very tight and painful esp ant towards quad with DN to LB and buttock    PT Treatment/Interventions ADLs/Self Care Home Management;Cryotherapy;Electrical Stimulation;Iontophoresis 67m/ml Dexamethasone;Moist Heat;Traction;Ultrasound;Gait training;Stair training;Functional mobility training;Therapeutic activities;Therapeutic exercise;Balance training;Neuromuscular re-education;Patient/family education;Manual techniques;Passive range of motion;Dry needling;Taping;Vasopneumatic Device;Spinal Manipulations;Joint Manipulations;Aquatic Therapy    PT Next Visit Plan STM, assess DN, core and hip stabilization. check goals             Patient will benefit from skilled therapeutic intervention in order to improve the following deficits and impairments:  Difficulty walking, Increased muscle spasms, Obesity, Decreased activity tolerance, Pain, Impaired flexibility, Improper body mechanics, Decreased strength  Visit Diagnosis: Chronic pain of left knee  Muscle weakness (generalized)  Acute left-sided low back pain with left-sided sciatica     Problem List Patient Active Problem List   Diagnosis Date Noted   Primary osteoarthritis of both knees 04/18/2021   Dizziness 12/02/2020   Nonspecific abnormal electrocardiogram (ECG) (EKG) 12/02/2020   UTI (urinary tract infection)    Thyroid disease    Hypertension    Hyperlipidemia    HA (headache)    GERD (gastroesophageal reflux disease)    Colon polyps    Chicken pox    Arthritis    Allergy    [place] 11/30/[place]   Retrognathia 11/30/2020   CPAP (continuous positive airway pressure) dependence 11/30/2020   OSA on CPAP 11/30/2020   CPAP use counseling 11/04/2020   Carpal tunnel syndrome, bilateral 11/04/2020   Plantar fasciitis, bilateral 11/04/2020    Torn ligament 11/04/2020  History of vein stripping 11/04/2020   Shingles 2019    Sumner Boast, PT 06/13/2021, 10:15 AM  [facility]. [place], [place], [address] Phone: [phone]   Fax:  [phone]  Name: Tricia Dodson MRN: [id number] Date of Birth: 1951/07/03

## 2021-06-15 ENCOUNTER — Other Ambulatory Visit: Payer: Self-pay

## 2021-06-15 ENCOUNTER — Ambulatory Visit: Payer: Medicare Other | Admitting: Physical Therapy

## 2021-06-15 DIAGNOSIS — M25562 Pain in left knee: Secondary | ICD-10-CM

## 2021-06-15 DIAGNOSIS — M6281 Muscle weakness (generalized): Secondary | ICD-10-CM

## 2021-06-15 DIAGNOSIS — G8929 Other chronic pain: Secondary | ICD-10-CM

## 2021-06-15 DIAGNOSIS — M5442 Lumbago with sciatica, left side: Secondary | ICD-10-CM

## 2021-06-15 NOTE — Therapy (Signed)
[facility]. [place], [place], [address] Phone: [phone]   Fax:  [phone]  Physical Therapy Treatment  Patient Details  Name: Tricia Dodson MRN: [id number] Date of Birth: 29-Aug-1950 Referring Provider (PT): Rosemarie Ax, MD   Encounter Date: 06/15/2021   PT End of Session - 06/15/21 1612     Visit Number 18    Date for PT Re-Evaluation 06/27/21    PT Start Time 0920    PT Stop Time 1010    PT Time Calculation (min) 50 min             Past Medical History:  Diagnosis Date   Allergy    Arthritis    Carpal tunnel syndrome, bilateral 11/04/2020   Both wrists.    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   CPAP use counseling 11/04/2020   GERD (gastroesophageal reflux disease)    HA (headache)    History of vein stripping 11/04/2020   Right leg   Hyperlipidemia    Hypertension    Morbid obesity ([place]) 11/30/2020   OSA on CPAP 11/30/2020   Plantar fasciitis, bilateral 11/04/2020   With Bone spors   Retrognathia 11/30/2020   Shingles 2019   Thyroid disease    Torn ligament 11/04/2020   Right knee   UTI (urinary tract infection)     Past Surgical History:  Procedure Laterality Date   BILATERAL CARPAL TUNNEL RELEASE     Hemorroids removed     KNEE ARTHROSCOPY Right    Plantar fasciatis      Repaired   right hand cyst removal     vein vascular     surgery    There were no vitals filed for this visit.   Subjective Assessment - 06/15/21 0924     Subjective moving better,DN helped    Currently in Pain? Yes    Pain Score 5     Pain Location Knee    Pain Orientation Right                               [facility] Adult PT Treatment/Exercise - 06/15/21 0001       Lumbar Exercises: Aerobic   Nustep level 5 x 6 minutes      Lumbar Exercises: Supine   Bridge with Ball Squeeze Compliant;15 reps;3 seconds    Bridge  with clamshell Compliant;15 reps;3 seconds      Knee/Hip Exercises: Machines for Strengthening   Cybex Knee Extension 15# BIL 12 x, 10 # SL 10 each    Cybex Knee Flexion 25# 15 x BIL, 15# SL 12 each      Knee/Hip Exercises: Standing   Other Standing Knee Exercises dead lift 10 x 2 sets 3#   hip cable pulleys 4 way BIL 10# 10 each     Knee/Hip Exercises: Supine   Other Supine Knee/Hip Exercises 3# hip flex up and over wt 10 each   3# SLR with abd 10 each   Other Supine Knee/Hip Exercises KTC with resistance 2 sets 10      Knee/Hip Exercises: Sidelying   Hip ABduction Strengthening;Left;3 sets;10 reps;Right   3# with variations     Manual Therapy   Manual Therapy Soft tissue mobilization;Passive ROM    Soft tissue mobilization Left ITB    Passive ROM LLE  PT Short Term Goals - 05/16/21 0951       PT SHORT TERM GOAL #1   Title Pt will be independent with updated HEP.    Baseline Added core strength exercises.    Time 4    Period Weeks    Status New    Target Date 06/13/21               PT Long Term Goals - 06/15/21 1613       PT LONG TERM GOAL #1   Title Pt will be independent with advanced HEP.    Status Partially Met      PT LONG TERM GOAL #3   Title Pt will report being able to walk for at least 30 minutes without increased pain to allow her to go out in the community with her group.    Status Partially Met      PT LONG TERM GOAL #4   Title Pt will demonstrate good posture and body mechanics during functional activities.    Status Partially Met      PT LONG TERM GOAL #5   Title Pt will be able to ascend/descend at least 10 steps with </=2/10 pain    Baseline able to do but pain increases    Status Partially Met                   Plan - 06/15/21 1612     Clinical Impression Statement progressing func strength with emphasis on hip flex and quad. progressing slowly with slight set back after retruning from  vacation.    PT Treatment/Interventions ADLs/Self Care Home Management;Cryotherapy;Electrical Stimulation;Iontophoresis 40m/ml Dexamethasone;Moist Heat;Traction;Ultrasound;Gait training;Stair training;Functional mobility training;Therapeutic activities;Therapeutic exercise;Balance training;Neuromuscular re-education;Patient/family education;Manual techniques;Passive range of motion;Dry needling;Taping;Vasopneumatic Device;Spinal Manipulations;Joint Manipulations;Aquatic Therapy             Patient will benefit from skilled therapeutic intervention in order to improve the following deficits and impairments:  Difficulty walking, Increased muscle spasms, Obesity, Decreased activity tolerance, Pain, Impaired flexibility, Improper body mechanics, Decreased strength  Visit Diagnosis: Chronic pain of left knee  Muscle weakness (generalized)  Acute left-sided low back pain with left-sided sciatica     Problem List Patient Active Problem List   Diagnosis Date Noted   Primary osteoarthritis of both knees 04/18/2021   Dizziness 12/02/2020   Nonspecific abnormal electrocardiogram (ECG) (EKG) 12/02/2020   UTI (urinary tract infection)    Thyroid disease    Hypertension    Hyperlipidemia    HA (headache)    GERD (gastroesophageal reflux disease)    Colon polyps    Chicken pox    Arthritis    Allergy    [place] 11/30/2020   Retrognathia 11/30/2020   CPAP (continuous positive airway pressure) dependence 11/30/2020   OSA on CPAP 11/30/2020   CPAP use counseling 11/04/2020   Carpal tunnel syndrome, bilateral 11/04/2020   Plantar fasciitis, bilateral 11/04/2020   Torn ligament 11/04/2020   History of vein stripping 11/04/2020   Shingles 2019    Tricia Dodson, PTA 06/15/2021, 4:15 PM  [place] [address]Phone: [phone]  Fax:  [phone] Name: Tricia KohenMRN:  [id number] of Birth: 722-Jun-1952

## 2021-06-19 ENCOUNTER — Ambulatory Visit: Payer: Medicare Other | Admitting: Family Medicine

## 2021-06-20 ENCOUNTER — Ambulatory Visit: Payer: Medicare Other | Admitting: Physical Therapy

## 2021-06-20 ENCOUNTER — Other Ambulatory Visit: Payer: Self-pay

## 2021-06-20 DIAGNOSIS — M5442 Lumbago with sciatica, left side: Secondary | ICD-10-CM

## 2021-06-20 DIAGNOSIS — G8929 Other chronic pain: Secondary | ICD-10-CM

## 2021-06-20 DIAGNOSIS — R262 Difficulty in walking, not elsewhere classified: Secondary | ICD-10-CM

## 2021-06-20 DIAGNOSIS — M6281 Muscle weakness (generalized): Secondary | ICD-10-CM

## 2021-06-20 DIAGNOSIS — R252 Cramp and spasm: Secondary | ICD-10-CM

## 2021-06-20 DIAGNOSIS — M25562 Pain in left knee: Secondary | ICD-10-CM | POA: Diagnosis not present

## 2021-06-20 NOTE — Therapy (Signed)
[facility]. [place], [place], [address] Phone: [phone]   Fax:  [phone]  Physical Therapy Treatment  Patient Details  Name: Tricia Dodson MRN: [id number] Date of Birth: 31-Dec-1950 Referring Provider (PT): Rosemarie Ax, MD   Encounter Date: 06/20/2021   PT End of Session - 06/20/21 0806     Visit Number 19    Date for PT Re-Evaluation 06/27/21    PT Start Time 0805    PT Stop Time 0845    PT Time Calculation (min) 40 min    Activity Tolerance Patient tolerated treatment well    Behavior During Therapy [facility] for tasks assessed/performed             Past Medical History:  Diagnosis Date   Allergy    Arthritis    Carpal tunnel syndrome, bilateral 11/04/2020   Both wrists.    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   Subjective Assessment - 06/20/21 0807     Subjective Pt states she's working on getting back on track with her therapy. "Sciatica is getting much better again. Knees aren't so great and right across the low back."    Pertinent History HTN, L knee torn meniscus    Limitations Walking;Standing    How long can you sit  comfortably? Depends the seating surface, but her reclincer works best    How long can you stand comfortably? about 10-15 min    How long can you walk comfortably? 100 yards    Patient Stated Goals I want to be able to go out walking again and go on outings with community group.    Currently in Pain? Yes    Pain Score 5     Pain Location Knee    Pain Orientation Right;Left    Pain Descriptors / Indicators Aching    Pain Type Chronic pain    Multiple Pain Sites Yes    Pain Score 3    Pain Location Back    Pain Orientation Lower    Pain Descriptors / Indicators Dull    Pain Type Chronic pain                [facility] PT Assessment - 06/20/21 0001       Assessment   Medical Diagnosis Left Knee Pain    Referring Provider (PT) Rosemarie Ax, MD    Prior Therapy Currently for her lumbar/sciatica  [facility] Adult PT Treatment/Exercise - 06/20/21 0001       Lumbar Exercises: Stretches   Passive Hamstring Stretch Right;Left;30 seconds    Hip Flexor Stretch Right;Left;30 seconds    Hip Flexor Stretch Limitations with quad stretch in modified Thomas position    ITB Stretch Right;Left;1 rep;30 seconds    Other Lumbar Stretch Exercise Pball roll out flexion & lateral flex x20 sec each      Lumbar Exercises: Aerobic   Nustep L5 x 6 min      Knee/Hip Exercises: Supine   Bridges Both;10 reps    Straight Leg Raises Strengthening;Right;Left;2 sets;10 reps      Knee/Hip Exercises: Sidelying   Hip ABduction Strengthening;Left;10 reps;Right;2 sets    Hip ABduction Limitations 3 lbs      Manual Therapy   Manual Therapy Soft tissue mobilization;Passive ROM    Soft tissue mobilization Bilat ITB, quad and hip flexor, glutes    Myofascial Release quad and hip flexors                       PT Short Term Goals - 05/16/21 0951       PT SHORT TERM GOAL #1   Title Pt will be independent with updated HEP.    Baseline Added core  strength exercises.    Time 4    Period Weeks    Status New    Target Date 06/13/21               PT Long Term Goals - 06/15/21 1613       PT LONG TERM GOAL #1   Title Pt will be independent with advanced HEP.    Status Partially Met      PT LONG TERM GOAL #3   Title Pt will report being able to walk for at least 30 minutes without increased pain to allow her to go out in the community with her group.    Status Partially Met      PT LONG TERM GOAL #4   Title Pt will demonstrate good posture and body mechanics during functional activities.    Status Partially Met      PT LONG TERM GOAL #5   Title Pt will be able to ascend/descend at least 10 steps with </=2/10 pain    Baseline able to do but pain increases    Status Partially Met                   Plan - 06/20/21 0833     Clinical Impression Statement Increased pain and tightness. Provided manual therapy and stretches to address glute and quad trigger points and tightness. Continued to work on LE strengthening this session.    Personal Factors and Comorbidities Age;Comorbidity 2    Comorbidities HTN, L knee meniscus tear    Examination-Activity Limitations Locomotion Level;Stairs    Examination-Participation Restrictions Community Activity    PT Treatment/Interventions ADLs/Self Care Home Management;Cryotherapy;Electrical Stimulation;Iontophoresis 59m/ml Dexamethasone;Moist Heat;Traction;Ultrasound;Gait training;Stair training;Functional mobility training;Therapeutic activities;Therapeutic exercise;Balance training;Neuromuscular re-education;Patient/family education;Manual techniques;Passive range of motion;Dry needling;Taping;Vasopneumatic Device;Spinal Manipulations;Joint Manipulations;Aquatic Therapy             Patient will benefit from skilled therapeutic intervention in order to improve the following deficits and impairments:  Difficulty walking, Increased muscle spasms, Obesity, Decreased activity  tolerance, Pain, Impaired flexibility, Improper body mechanics, Decreased strength  Visit Diagnosis: Chronic pain of left knee  Muscle weakness (generalized)  Acute left-sided low back pain with left-sided sciatica  Cramp and spasm  Difficulty in walking, not elsewhere classified     Problem List Patient Active Problem List   Diagnosis Date Noted   Primary osteoarthritis of both knees 04/18/2021   Dizziness 12/02/2020   Nonspecific abnormal electrocardiogram (ECG) (EKG) 12/02/2020   UTI (urinary tract infection)    Thyroid disease    Hypertension    Hyperlipidemia    HA (headache)    GERD (gastroesophageal reflux disease)    Colon polyps    Chicken pox    Arthritis    Allergy    Morbid obesity ([place]) 11/30/2020   Retrognathia 11/30/2020   CPAP (continuous positive airway pressure) dependence 11/30/2020   OSA on CPAP 11/30/2020   CPAP use counseling 11/04/2020   Carpal tunnel syndrome, bilateral 11/04/2020   Plantar fasciitis, bilateral 11/04/2020   Torn ligament 11/04/2020   History of vein stripping 11/04/2020   Shingles 2019    Tricia Dodson Tricia Dodson, PT, DPT 06/20/2021, 8:46 AM  [facility]. [place], [place], [address] Phone: [phone]   Fax:  [phone]  Name: Tricia Dodson MRN: [id number] Date of Birth: March 30, 1951

## 2021-06-22 ENCOUNTER — Encounter: Payer: Self-pay | Admitting: Physical Therapy

## 2021-06-22 ENCOUNTER — Ambulatory Visit: Payer: Medicare Other | Admitting: Physical Therapy

## 2021-06-22 ENCOUNTER — Other Ambulatory Visit: Payer: Self-pay

## 2021-06-22 DIAGNOSIS — R252 Cramp and spasm: Secondary | ICD-10-CM

## 2021-06-22 DIAGNOSIS — R262 Difficulty in walking, not elsewhere classified: Secondary | ICD-10-CM

## 2021-06-22 DIAGNOSIS — M25562 Pain in left knee: Secondary | ICD-10-CM | POA: Diagnosis not present

## 2021-06-22 DIAGNOSIS — G8929 Other chronic pain: Secondary | ICD-10-CM

## 2021-06-22 DIAGNOSIS — M6281 Muscle weakness (generalized): Secondary | ICD-10-CM

## 2021-06-22 DIAGNOSIS — M5442 Lumbago with sciatica, left side: Secondary | ICD-10-CM

## 2021-06-22 NOTE — Therapy (Signed)
[place]. [place], [place], [address] Phone: [phone]   Fax:  [phone] Progress Note Reporting Period 04/06/21 to 06/22/21  See note below for Objective Data and Assessment of Progress/Goals.     Physical Therapy Treatment  Patient Details  Name: Tricia Dodson MRN: [id number] Date of Birth: January 14, 1951 Referring Provider (PT): Rosemarie Ax, MD   Encounter Date: 06/22/2021   PT End of Session - 06/22/21 1013     Visit Number 20    Date for PT Re-Evaluation 06/27/21    PT Start Time 0930    PT Stop Time 1014    PT Time Calculation (min) 44 min    Activity Tolerance Patient tolerated treatment well    Behavior During Therapy [facility] for tasks assessed/performed             Past Medical History:  Diagnosis Date   Allergy    Arthritis    Carpal tunnel syndrome, bilateral 11/04/2020   Both wrists.    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   CPAP use counseling 11/04/2020   GERD (gastroesophageal reflux disease)    HA (headache)    History of vein stripping 11/04/2020   Right leg   Hyperlipidemia    Hypertension    Morbid obesity ([place]) 11/30/2020   OSA on CPAP 11/30/2020   Plantar fasciitis, bilateral 11/04/2020   With Bone spors   Retrognathia 11/30/2020   Shingles 2019   Thyroid disease    Torn ligament 11/04/2020   Right knee   UTI (urinary tract infection)     Past Surgical History:  Procedure Laterality Date   BILATERAL CARPAL TUNNEL RELEASE     Hemorroids removed     KNEE ARTHROSCOPY Right    Plantar fasciatis      Repaired   right hand cyst removal     vein vascular     surgery    There were no vitals filed for this visit.   Subjective Assessment - 06/22/21 0933     Subjective "Not too bad today"    Currently in Pain? No/denies                [facility] PT Assessment - 06/22/21 0001       Strength   Left Hip  Internal Rotation 4-/5    Left Knee Flexion 4/5    Left Knee Extension 4/5                           [facility] Adult PT Treatment/Exercise - 06/22/21 0001       Lumbar Exercises: Stretches   Passive Hamstring Stretch Right;Left;3 reps;20 seconds    Single Knee to Chest Stretch Left;Right;3 reps;20 seconds    ITB Stretch Left;Right;4 reps;20 seconds    Piriformis Stretch Right;Left;3 reps;10 seconds      Lumbar Exercises: Aerobic   Nustep L5 x 6 min      Lumbar Exercises: Seated   Sit to Stand 10 reps   yellow OHP x2     Lumbar Exercises: Supine   Bridge Compliant;10 reps;2 seconds    Other Supine Lumbar Exercises LE on pball bridges, K2C, Oblq x10      Knee/Hip Exercises: Standing   Walking with Sports Cord 30# 5x fwd/back,3 x each side  PT Short Term Goals - 05/16/21 0951       PT SHORT TERM GOAL #1   Title Pt will be independent with updated HEP.    Baseline Added core strength exercises.    Time 4    Period Weeks    Status New    Target Date 06/13/21               PT Long Term Goals - 06/22/21 1014       PT LONG TERM GOAL #1   Title Pt will be independent with advanced HEP.    Status Achieved      PT LONG TERM GOAL #2   Title Pt will increase FOTO to 65%.    Status On-going      PT LONG TERM GOAL #3   Title Pt will report being able to walk for at least 30 minutes without increased pain to allow her to go out in the community with her group.    Status Partially Met      PT LONG TERM GOAL #4   Title Pt will demonstrate good posture and body mechanics during functional activities.    Status Partially Met      PT LONG TERM GOAL #5   Title Pt will be able to ascend/descend at least 10 steps with </=2/10 pain    Status Partially Met                   Plan - 06/22/21 1015     Clinical Impression Statement Pt continues to do ell overall. She has progressed increasing strength in her LE. Some  hip and core weakness present with supine bridges on Pball. Bilateral tightness in both ITB noted with stretching.    Personal Factors and Comorbidities Age;Comorbidity 2    Comorbidities HTN, L knee meniscus tear    Examination-Activity Limitations Locomotion Level;Stairs    Examination-Participation Restrictions Community Activity    Stability/Clinical Decision Making Evolving/Moderate complexity    Rehab Potential Good    PT Frequency 2x / week    PT Duration 6 weeks    PT Treatment/Interventions ADLs/Self Care Home Management;Cryotherapy;Electrical Stimulation;Iontophoresis 34m/ml Dexamethasone;Moist Heat;Traction;Ultrasound;Gait training;Stair training;Functional mobility training;Therapeutic activities;Therapeutic exercise;Balance training;Neuromuscular re-education;Patient/family education;Manual techniques;Passive range of motion;Dry needling;Taping;Vasopneumatic Device;Spinal Manipulations;Joint Manipulations;Aquatic Therapy    PT Next Visit Plan STM, assess DN, core and hip stabilization.             Patient will benefit from skilled therapeutic intervention in order to improve the following deficits and impairments:  Difficulty walking, Increased muscle spasms, Obesity, Decreased activity tolerance, Pain, Impaired flexibility, Improper body mechanics, Decreased strength  Visit Diagnosis: Chronic pain of left knee  Acute left-sided low back pain with left-sided sciatica  Cramp and spasm  Difficulty in walking, not elsewhere classified  Muscle weakness (generalized)     Problem List Patient Active Problem List   Diagnosis Date Noted   Primary osteoarthritis of both knees 04/18/2021   Dizziness 12/02/2020   Nonspecific abnormal electrocardiogram (ECG) (EKG) 12/02/2020   UTI (urinary tract infection)    Thyroid disease    Hypertension    Hyperlipidemia    HA (headache)    GERD (gastroesophageal reflux disease)    Colon polyps    Chicken pox    Arthritis     Allergy    [place] 11/30/2020   Retrognathia 11/30/2020   CPAP (continuous positive airway pressure) dependence 11/30/2020   OSA on CPAP 11/30/2020   CPAP use counseling 11/04/2020  Carpal tunnel syndrome, bilateral 11/04/2020   Plantar fasciitis, bilateral 11/04/2020   Torn ligament 11/04/2020   History of vein stripping 11/04/2020   Shingles 2019    Scot Jun, PTA 06/22/2021, 10:17 AM  [place]. [place], [place], [address] Phone: [phone]   Fax:  [phone]  Name: Romey Cohea MRN: [id number] Date of Birth: [birth year]

## 2021-06-27 ENCOUNTER — Ambulatory Visit: Payer: Medicare Other | Admitting: Physical Therapy

## 2021-07-04 ENCOUNTER — Other Ambulatory Visit: Payer: Self-pay

## 2021-07-04 ENCOUNTER — Ambulatory Visit: Payer: Medicare Other | Admitting: Physical Therapy

## 2021-07-04 DIAGNOSIS — R262 Difficulty in walking, not elsewhere classified: Secondary | ICD-10-CM

## 2021-07-04 DIAGNOSIS — M25562 Pain in left knee: Secondary | ICD-10-CM | POA: Diagnosis not present

## 2021-07-04 DIAGNOSIS — M6281 Muscle weakness (generalized): Secondary | ICD-10-CM

## 2021-07-04 NOTE — Therapy (Signed)
[place] [place], [place], [address] Phone: [phone]   Fax:  [phone]  Physical Therapy Treatment  Patient Details  Name: Tricia Dodson MRN: [id number] Date of Birth: Mar 30, 1951 Referring Provider (PT): Rosemarie Ax, MD   Encounter Date: 07/04/2021   PT End of Session - 07/04/21 1026     Visit Number 21    PT Start Time 6962    PT Stop Time 1055    PT Time Calculation (min) 40 min             Past Medical History:  Diagnosis Date   Allergy    Arthritis    Carpal tunnel syndrome, bilateral 11/04/2020   Both wrists.    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   CPAP use counseling 11/04/2020   GERD (gastroesophageal reflux disease)    HA (headache)    History of vein stripping 11/04/2020   Right leg   Hyperlipidemia    Hypertension    Morbid obesity ([place]) 11/30/2020   OSA on CPAP 11/30/2020   Plantar fasciitis, bilateral 11/04/2020   With Bone spors   Retrognathia 11/30/2020   Shingles 2019   Thyroid disease    Torn ligament 11/04/2020   Right knee   UTI (urinary tract infection)     Past Surgical History:  Procedure Laterality Date   BILATERAL CARPAL TUNNEL RELEASE     Hemorroids removed     KNEE ARTHROSCOPY Right    Plantar fasciatis      Repaired   right hand cyst removal     vein vascular     surgery    There were no vitals filed for this visit.   Subjective Assessment - 07/04/21 1017     Subjective original issue much better but no changes in knee- starting new research study and pool ex. need to find ortho MD. okay with D/C    Currently in Pain? Yes    Pain Score 4     Pain Location Knee                               [facility] Adult PT Treatment/Exercise - 07/04/21 0001       Lumbar Exercises: Aerobic   Nustep L5 x 6 min      Lumbar Exercises: Machines for Strengthening   Cybex Lumbar  Extension black tband 20 x    Leg Press 40# 3x 10 feet 3 way    Other Lumbar Machine Exercise seated row and lats 25 # 2x10      Knee/Hip Exercises: Machines for Strengthening   Cybex Knee Extension 15# BIL 12 x, 10 # SL 10 each    Cybex Knee Flexion 25# 15 x BIL, 15# SL 15 each      Knee/Hip Exercises: Standing   Heel Raises Both;15 reps   black bar heel raise and toe raises   Walking with Sports Cord 30# 5x fwd/back,3 x each side      Knee/Hip Exercises: Seated   Ball Squeeze 20x    Clamshell with TheraBand Blue    Marching Strengthening;Both;20 reps   blue                      PT Short Term Goals - 05/16/21 0951       PT SHORT TERM GOAL #1  Title Pt will be independent with updated HEP.    Baseline Added core strength exercises.    Time 4    Period Weeks    Status New    Target Date 06/13/21               PT Long Term Goals - 07/04/21 1020       PT LONG TERM GOAL #2   Title Pt will increase FOTO to 65%.    Status Achieved      PT LONG TERM GOAL #3   Title Pt will report being able to walk for at least 30 minutes without increased pain to allow her to go out in the community with her group.    Status Achieved      PT LONG TERM GOAL #4   Title Pt will demonstrate good posture and body mechanics during functional activities.    Status Achieved      PT LONG TERM GOAL #5   Title Pt will be able to ascend/descend at least 10 steps with </=2/10 pain    Baseline able to do but pain increases    Status Partially Met                   Plan - 07/04/21 1039     Clinical Impression Statement all goals met except steps are still an issue. pt with game plan in new year with being part of research study, silver sneakers and finding ortho. educ on transition into gym and water    PT Treatment/Interventions ADLs/Self Care Home Management;Cryotherapy;Electrical Stimulation;Iontophoresis 68m/ml Dexamethasone;Moist Heat;Traction;Ultrasound;Gait  training;Stair training;Functional mobility training;Therapeutic activities;Therapeutic exercise;Balance training;Neuromuscular re-education;Patient/family education;Manual techniques;Passive range of motion;Dry needling;Taping;Vasopneumatic Device;Spinal Manipulations;Joint Manipulations;Aquatic Therapy    PT Next Visit Plan D/C             Patient will benefit from skilled therapeutic intervention in order to improve the following deficits and impairments:  Difficulty walking, Increased muscle spasms, Obesity, Decreased activity tolerance, Pain, Impaired flexibility, Improper body mechanics, Decreased strength  Visit Diagnosis: Chronic pain of left knee  Muscle weakness (generalized)  Difficulty in walking, not elsewhere classified     Problem List Patient Active Problem List   Diagnosis Date Noted   Primary osteoarthritis of both knees 04/18/2021   Dizziness 12/02/2020   Nonspecific abnormal electrocardiogram (ECG) (EKG) 12/02/2020   UTI (urinary tract infection)    Thyroid disease    Hypertension    Hyperlipidemia    HA (headache)    GERD (gastroesophageal reflux disease)    Colon polyps    Chicken pox    Arthritis    Allergy    Morbid obesity ([place] 11/30/2020   Retrognathia 11/30/2020   CPAP (continuous positive airway pressure) dependence 11/30/2020   OSA on CPAP 11/30/2020   CPAP use counseling 11/04/2020   Carpal tunnel syndrome, bilateral 11/04/2020   Plantar fasciitis, bilateral 11/04/2020   Torn ligament 11/04/2020   History of vein stripping 11/04/2020   Shingles 2019   PHYSICAL THERAPY DISCHARGE SUMMARY  Patient agrees to discharge. Patient goals were met. Patient is being discharged due to meeting the stated rehab goals.  Tricia Dodson,[name], PTA 07/04/2021, 10:43 AM  [place] [address]Phone: [phone]  Fax:  [phone] Name: Tricia SherardMRN:  [id number] of Birth: [birth year]

## 2021-07-06 ENCOUNTER — Ambulatory Visit: Payer: Medicare Other | Admitting: Physical Therapy

## 2021-07-11 ENCOUNTER — Ambulatory Visit: Payer: Federal, State, Local not specified - PPO | Admitting: Physical Therapy

## 2021-07-11 ENCOUNTER — Other Ambulatory Visit: Payer: Self-pay | Admitting: Family

## 2021-07-11 NOTE — Telephone Encounter (Signed)
Okay to refill rx sooner?

## 2021-07-12 MED ORDER — GABAPENTIN 300 MG PO CAPS: 300.0000 mg | ORAL_CAPSULE | Freq: Two times a day (BID) | ORAL | 0 refills | Status: DC

## 2021-07-13 ENCOUNTER — Ambulatory Visit: Payer: Federal, State, Local not specified - PPO | Admitting: Physical Therapy

## 2021-07-18 ENCOUNTER — Ambulatory Visit (INDEPENDENT_AMBULATORY_CARE_PROVIDER_SITE_OTHER): Payer: Medicare Other | Admitting: Family

## 2021-07-18 VITALS — BP 122/80 | HR 66 | Temp 97.8°F | Resp 18 | Ht 67.0 in | Wt 259.2 lb

## 2021-07-18 DIAGNOSIS — E782 Mixed hyperlipidemia: Secondary | ICD-10-CM

## 2021-07-18 DIAGNOSIS — I10 Essential (primary) hypertension: Secondary | ICD-10-CM | POA: Diagnosis not present

## 2021-07-18 DIAGNOSIS — K219 Gastro-esophageal reflux disease without esophagitis: Secondary | ICD-10-CM

## 2021-07-18 DIAGNOSIS — E119 Type 2 diabetes mellitus without complications: Secondary | ICD-10-CM | POA: Diagnosis not present

## 2021-07-18 DIAGNOSIS — E039 Hypothyroidism, unspecified: Secondary | ICD-10-CM | POA: Diagnosis not present

## 2021-07-18 LAB — CBC WITH DIFFERENTIAL/PLATELET
Basophils Absolute: 0 10*3/uL (ref 0.0–0.1)
Basophils Relative: 0.4 % (ref 0.0–3.0)
Eosinophils Absolute: 0.2 10*3/uL (ref 0.0–0.7)
Eosinophils Relative: 2.4 % (ref 0.0–5.0)
HCT: 42.6 % (ref 36.0–46.0)
Hemoglobin: 13.9 g/dL (ref 12.0–15.0)
Lymphocytes Relative: 28.2 % (ref 12.0–46.0)
Lymphs Abs: 1.8 10*3/uL (ref 0.7–4.0)
MCHC: 32.7 g/dL (ref 30.0–36.0)
MCV: 85.9 fl (ref 78.0–100.0)
Monocytes Absolute: 0.4 10*3/uL (ref 0.1–1.0)
Monocytes Relative: 5.5 % (ref 3.0–12.0)
Neutro Abs: 4.1 10*3/uL (ref 1.4–7.7)
Neutrophils Relative %: 63.5 % (ref 43.0–77.0)
Platelets: 307 10*3/uL (ref 150.0–400.0)
RBC: 4.95 Mil/uL (ref 3.87–5.11)
RDW: 13.4 % (ref 11.5–15.5)
WBC: 6.5 10*3/uL (ref 4.0–10.5)

## 2021-07-18 LAB — COMPREHENSIVE METABOLIC PANEL
ALT: 25 U/L (ref 0–35)
AST: 23 U/L (ref 0–37)
Albumin: 4.3 g/dL (ref 3.5–5.2)
Alkaline Phosphatase: 59 U/L (ref 39–117)
BUN: 14 mg/dL (ref 6–23)
CO2: 28 mEq/L (ref 19–32)
Calcium: 9.5 mg/dL (ref 8.4–10.5)
Chloride: 105 mEq/L (ref 96–112)
Creatinine, Ser: 1.14 mg/dL (ref 0.40–1.20)
GFR: 48.8 mL/min — ABNORMAL LOW (ref >60.00)
Glucose, Bld: 119 mg/dL — ABNORMAL HIGH (ref 70–99)
Potassium: 4.3 mEq/L (ref 3.5–5.1)
Sodium: 140 mEq/L (ref 135–145)
Total Bilirubin: 0.6 mg/dL (ref 0.2–1.2)
Total Protein: 6.7 g/dL (ref 6.0–8.3)

## 2021-07-18 LAB — HEMOGLOBIN A1C: Hgb A1c MFr Bld: 6.9 % — ABNORMAL HIGH (ref 4.6–6.5)

## 2021-07-18 MED ORDER — LEVOCETIRIZINE DIHYDROCHLORIDE 5 MG PO TABS: 5.0000 mg | ORAL_TABLET | Freq: Every day | ORAL | 3 refills | Status: DC

## 2021-07-18 MED ORDER — ATORVASTATIN CALCIUM 10 MG PO TABS: 10.0000 mg | ORAL_TABLET | Freq: Every day | ORAL | 3 refills | Status: DC

## 2021-07-18 MED ORDER — AMITRIPTYLINE HCL 10 MG PO TABS: ORAL_TABLET | ORAL | 3 refills | Status: DC

## 2021-07-18 MED ORDER — GABAPENTIN 300 MG PO CAPS: 300.0000 mg | ORAL_CAPSULE | Freq: Two times a day (BID) | ORAL | 3 refills | Status: DC

## 2021-07-18 MED ORDER — FENOFIBRATE 134 MG PO CAPS: 134.0000 mg | ORAL_CAPSULE | Freq: Every day | ORAL | 3 refills | Status: DC

## 2021-07-18 MED ORDER — LEVOTHYROXINE SODIUM 25 MCG PO TABS: ORAL_TABLET | ORAL | 3 refills | Status: DC

## 2021-07-18 MED ORDER — OMEPRAZOLE 40 MG PO CPDR: 40.0000 mg | DELAYED_RELEASE_CAPSULE | Freq: Two times a day (BID) | ORAL | 3 refills | Status: DC

## 2021-07-18 MED ORDER — EMPAGLIFLOZIN 10 MG PO TABS: ORAL_TABLET | ORAL | 1 refills | Status: DC

## 2021-07-18 MED ORDER — LOSARTAN POTASSIUM 50 MG PO TABS: ORAL_TABLET | ORAL | 3 refills | Status: DC

## 2021-07-18 NOTE — Progress Notes (Signed)
Tricia Dodson is a 71 y.o. female with the following history as recorded in EpicCare:  Patient Active Problem List   Diagnosis Date Noted   Primary osteoarthritis of both knees 04/18/2021   Dizziness 12/02/2020   Nonspecific abnormal electrocardiogram (ECG) (EKG) 12/02/2020   UTI (urinary tract infection)    Thyroid disease    Hypertension    Hyperlipidemia    HA (headache)    GERD (gastroesophageal reflux disease)    Colon polyps    Chicken pox    Arthritis    Allergy    Morbid obesity ([place]) 11/30/2020   Retrognathia 11/30/2020   CPAP (continuous positive airway pressure) dependence 11/30/2020   OSA on CPAP 11/30/2020   CPAP use counseling 11/04/2020   Carpal tunnel syndrome, bilateral 11/04/2020   Plantar fasciitis, bilateral 11/04/2020   Torn ligament 11/04/2020   History of vein stripping 11/04/2020   Shingles 2019    Current Outpatient Medications  Medication Sig Dispense Refill   triamcinolone cream (KENALOG) 0.1 % Apply 1 application topically daily. 60 g 2   amitriptyline (ELAVIL) 10 MG tablet TAKE 1 TABLET(10 MG) BY MOUTH AT BEDTIME 90 tablet 3   atorvastatin (LIPITOR) 10 MG tablet Take 1 tablet (10 mg total) by mouth daily. 90 tablet 3   DULoxetine (CYMBALTA) 20 MG capsule Take 1 capsule by mouth daily. Holding for now. (Patient not taking: Reported on 07/18/2021)     empagliflozin (JARDIANCE) 10 MG TABS tablet TAKE 1 TABLET(10 MG) BY MOUTH DAILY BEFORE BREAKFAST 90 tablet 1   fenofibrate micronized (LOFIBRA) 134 MG capsule Take 1 capsule (134 mg total) by mouth daily before breakfast. 90 capsule 3   gabapentin (NEURONTIN) 300 MG capsule Take 1 capsule (300 mg total) by mouth 2 (two) times daily. 180 capsule 3   levocetirizine (XYZAL) 5 MG tablet Take 1 tablet (5 mg total) by mouth daily. 90 tablet 3   levothyroxine (SYNTHROID) 25 MCG tablet TAKE 1 TABLET(25 MCG) BY MOUTH DAILY 90 tablet 3   losartan (COZAAR) 50 MG tablet TAKE 1 TABLET(50 MG) BY MOUTH DAILY 90  tablet 3   omeprazole (PRILOSEC) 40 MG capsule Take 1 capsule (40 mg total) by mouth 2 (two) times daily. 180 capsule 3   No current facility-administered medications for this visit.    Allergies: Patient has no known allergies.  Past Medical History:  Diagnosis Date   Allergy    Arthritis    Carpal tunnel syndrome, bilateral 11/04/2020   Both wrists.    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   CPAP use counseling 11/04/2020   GERD (gastroesophageal reflux disease)    HA (headache)    History of vein stripping 11/04/2020   Right leg   Hyperlipidemia    Hypertension    Morbid obesity ([place]) 11/30/2020   OSA on CPAP 11/30/2020   Plantar fasciitis, bilateral 11/04/2020   With Bone spors   Retrognathia 11/30/2020   Shingles 2019   Thyroid disease    Torn ligament 11/04/2020   Right knee   UTI (urinary tract infection)     Past Surgical History:  Procedure Laterality Date   BILATERAL CARPAL TUNNEL RELEASE     Hemorroids removed     KNEE ARTHROSCOPY Right    Plantar fasciatis      Repaired   right hand cyst removal     vein vascular     surgery    Family History  Problem Relation  Age of Onset   Arthritis Mother    Diabetes Father    Heart disease Father    Arthritis Sister    Diabetes Sister    Hyperlipidemia Sister    Hyperlipidemia Brother    Heart disease Brother        triple heart bypass   Sleep apnea Brother     Social History   Tobacco Use   Smoking status: Former   Smokeless tobacco: Never  Substance Use Topics   Alcohol use: Yes    Comment: occ    Subjective:  Will be participating in a research study through [facility] to evaluate benefit of Duloxetine on knee pain; Has been trying to be more active since last OV and has lost 5 pounds; doing well on Jardiance; no further GI complaints since stopping Metformin; Denies any chest pain, shortness of breath, blurred vision or headache  Does need all  of her medications refilled today if possible;     Objective:  Vitals:   07/18/21 0947  BP: 122/80  Pulse: 66  Resp: 18  Temp: 97.8 F (36.6 C)  TempSrc: Oral  SpO2: 98%  Weight: 259 lb 3.2 oz (117.6 kg)  Height: 5' 7"  (1.702 m)    General: Well developed, well nourished, in no acute distress  Skin : Warm and dry.  Head: Normocephalic and atraumatic  Lungs: Respirations unlabored; clear to auscultation bilaterally without wheeze, rales, rhonchi  CVS exam: normal rate and regular rhythm.  Vessels: Symmetric bilaterally  Neurologic: Alert and oriented; speech intact; face symmetrical; moves all extremities well; CNII-XII intact without focal deficit   Assessment:  1. Type 2 diabetes mellitus without complication, without long-term current use of insulin ([place])   2. Mixed hyperlipidemia   3. Primary hypertension   4. Gastroesophageal reflux disease, unspecified whether esophagitis present   5. Hypothyroidism, unspecified type     Plan:  Update labs today; doing well on current regimen; congratulated patient on weight loss effort; refills updated as requested; No concerns regarding patient's participation in upcoming study for chronic knee pain;   This visit occurred during the SARS-CoV-2 public health emergency.  Safety protocols were in place, including screening questions prior to the visit, additional usage of staff PPE, and extensive cleaning of exam room while observing appropriate contact time as indicated for disinfecting solutions.    No follow-ups on file.  Orders Placed This Encounter  Procedures   CBC with Differential/Platelet   Comp Met (CMET)   Hemoglobin A1c    Requested Prescriptions   Signed Prescriptions Disp Refills   atorvastatin (LIPITOR) 10 MG tablet 90 tablet 3    Sig: Take 1 tablet (10 mg total) by mouth daily.   fenofibrate micronized (LOFIBRA) 134 MG capsule 90 capsule 3    Sig: Take 1 capsule (134 mg total) by mouth daily before breakfast.    levothyroxine (SYNTHROID) 25 MCG tablet 90 tablet 3    Sig: TAKE 1 TABLET(25 MCG) BY MOUTH DAILY   losartan (COZAAR) 50 MG tablet 90 tablet 3    Sig: TAKE 1 TABLET(50 MG) BY MOUTH DAILY   omeprazole (PRILOSEC) 40 MG capsule 180 capsule 3    Sig: Take 1 capsule (40 mg total) by mouth 2 (two) times daily.   amitriptyline (ELAVIL) 10 MG tablet 90 tablet 3    Sig: TAKE 1 TABLET(10 MG) BY MOUTH AT BEDTIME   levocetirizine (XYZAL) 5 MG tablet 90 tablet 3    Sig: Take 1 tablet (5 mg total)  by mouth daily.   empagliflozin (JARDIANCE) 10 MG TABS tablet 90 tablet 1    Sig: TAKE 1 TABLET(10 MG) BY MOUTH DAILY BEFORE BREAKFAST   gabapentin (NEURONTIN) 300 MG capsule 180 capsule 3    Sig: Take 1 capsule (300 mg total) by mouth 2 (two) times daily.

## 2021-08-04 DIAGNOSIS — G4733 Obstructive sleep apnea (adult) (pediatric): Secondary | ICD-10-CM | POA: Diagnosis not present

## 2021-08-31 ENCOUNTER — Telehealth: Payer: Medicare Other | Admitting: Physician Assistant

## 2021-08-31 DIAGNOSIS — J208 Acute bronchitis due to other specified organisms: Secondary | ICD-10-CM

## 2021-08-31 DIAGNOSIS — B9689 Other specified bacterial agents as the cause of diseases classified elsewhere: Secondary | ICD-10-CM

## 2021-08-31 DIAGNOSIS — J019 Acute sinusitis, unspecified: Secondary | ICD-10-CM | POA: Diagnosis not present

## 2021-08-31 MED ORDER — LEVOFLOXACIN 500 MG PO TABS: 500.0000 mg | ORAL_TABLET | Freq: Every day | ORAL | 0 refills | Status: AC

## 2021-08-31 MED ORDER — BENZONATATE 100 MG PO CAPS: 100.0000 mg | ORAL_CAPSULE | Freq: Three times a day (TID) | ORAL | 0 refills | Status: DC | PRN

## 2021-08-31 MED ORDER — PREDNISONE 20 MG PO TABS: 20.0000 mg | ORAL_TABLET | Freq: Every day | ORAL | 0 refills | Status: DC

## 2021-08-31 MED ORDER — PROMETHAZINE-DM 6.25-15 MG/5ML PO SYRP: 5.0000 mL | ORAL_SOLUTION | Freq: Four times a day (QID) | ORAL | 0 refills | Status: DC | PRN

## 2021-08-31 NOTE — Progress Notes (Signed)
Virtual Visit Consent   Tricia Dodson, you are scheduled for a virtual visit with a [facility] provider today.     Just as with appointments in the office, your consent must be obtained to participate.  Your consent will be active for this visit and any virtual visit you may have with one of our providers in the next 365 days.     If you have a MyChart account, a copy of this consent can be sent to you electronically.  All virtual visits are billed to your insurance company just like a traditional visit in the office.    As this is a virtual visit, video technology does not allow for your provider to perform a traditional examination.  This may limit your provider's ability to fully assess your condition.  If your provider identifies any concerns that need to be evaluated in person or the need to arrange testing (such as labs, EKG, etc.), we will make arrangements to do so.     Although advances in technology are sophisticated, we cannot ensure that it will always work on either your end or our end.  If the connection with a video visit is poor, the visit may have to be switched to a telephone visit.  With either a video or telephone visit, we are not always able to ensure that we have a secure connection.     I need to obtain your verbal consent now.   Are you willing to proceed with your visit today?    [name] has provided verbal consent on 08/31/2021 for a virtual visit (video or telephone).   Mar Daring, PA-C   Date: 08/31/2021 1:28 PM   Virtual Visit via Video Note   IMar Daring, connected with  Tricia Dodson  ([id number], September 14, 1950) on 08/31/21 at  1:00 PM EST by a video-enabled telemedicine application and verified that I am speaking with the correct person using two identifiers.  Location: Patient: Virtual Visit Location Patient: Home Provider: Virtual Visit Location Provider: Home Office   I discussed the limitations of evaluation and  management by telemedicine and the availability of in person appointments. The patient expressed understanding and agreed to proceed.    History of Present Illness: Tricia Dodson is a 71 y.o. who identifies as a female who was assigned female at birth, and is being seen today for ongoing flu like symptoms.  HPI: URI  This is a new problem. The current episode started 1 to 4 weeks ago. The problem has been unchanged. Maximum temperature: subjective fevers. Associated symptoms include congestion, coughing, diarrhea, ear pain, headaches, nausea (few days ago), a plugged ear sensation, rhinorrhea, sinus pain and a sore throat (scratchy throat initially). Associated symptoms comments: Tinnitus and muffled hearing, body aches, increased fatigue, decreased appetite, coughing wakes her every hour or two. Treatments tried: theraflu, robitussin, mucinex. The treatment provided no relief.   O2 sat was 95%  Problems:  Patient Active Problem List   Diagnosis Date Noted   Primary osteoarthritis of both knees 04/18/2021   Dizziness 12/02/2020   Nonspecific abnormal electrocardiogram (ECG) (EKG) 12/02/2020   UTI (urinary tract infection)    Thyroid disease    Hypertension    Hyperlipidemia    HA (headache)    GERD (gastroesophageal reflux disease)    Colon polyps    Chicken pox    Arthritis    Allergy    Morbid obesity ([place]) 11/30/2020   Retrognathia 11/30/2020   CPAP (continuous positive airway  pressure) dependence 11/30/2020   OSA on CPAP 11/30/2020   CPAP use counseling 11/04/2020   Carpal tunnel syndrome, bilateral 11/04/2020   Plantar fasciitis, bilateral 11/04/2020   Torn ligament 11/04/2020   History of vein stripping 11/04/2020   Shingles 2019    Allergies: No Known Allergies Medications:  Current Outpatient Medications:    benzonatate (TESSALON) 100 MG capsule, Take 1 capsule (100 mg total) by mouth 3 (three) times daily as needed., Disp: 30 capsule, Rfl: 0   levofloxacin  (LEVAQUIN) 500 MG tablet, Take 1 tablet (500 mg total) by mouth daily for 7 days., Disp: 7 tablet, Rfl: 0   predniSONE (DELTASONE) 20 MG tablet, Take 1 tablet (20 mg total) by mouth daily with breakfast., Disp: 7 tablet, Rfl: 0   promethazine-dextromethorphan (PROMETHAZINE-DM) 6.25-15 MG/5ML syrup, Take 5 mLs by mouth 4 (four) times daily as needed., Disp: 118 mL, Rfl: 0   amitriptyline (ELAVIL) 10 MG tablet, TAKE 1 TABLET(10 MG) BY MOUTH AT BEDTIME, Disp: 90 tablet, Rfl: 3   atorvastatin (LIPITOR) 10 MG tablet, Take 1 tablet (10 mg total) by mouth daily., Disp: 90 tablet, Rfl: 3   DULoxetine (CYMBALTA) 20 MG capsule, Take 1 capsule by mouth daily. Holding for now. (Patient not taking: Reported on 07/18/2021), Disp: , Rfl:    empagliflozin (JARDIANCE) 10 MG TABS tablet, TAKE 1 TABLET(10 MG) BY MOUTH DAILY BEFORE BREAKFAST, Disp: 90 tablet, Rfl: 1   fenofibrate micronized (LOFIBRA) 134 MG capsule, Take 1 capsule (134 mg total) by mouth daily before breakfast., Disp: 90 capsule, Rfl: 3   gabapentin (NEURONTIN) 300 MG capsule, Take 1 capsule (300 mg total) by mouth 2 (two) times daily., Disp: 180 capsule, Rfl: 3   levocetirizine (XYZAL) 5 MG tablet, Take 1 tablet (5 mg total) by mouth daily., Disp: 90 tablet, Rfl: 3   levothyroxine (SYNTHROID) 25 MCG tablet, TAKE 1 TABLET(25 MCG) BY MOUTH DAILY, Disp: 90 tablet, Rfl: 3   losartan (COZAAR) 50 MG tablet, TAKE 1 TABLET(50 MG) BY MOUTH DAILY, Disp: 90 tablet, Rfl: 3   omeprazole (PRILOSEC) 40 MG capsule, Take 1 capsule (40 mg total) by mouth 2 (two) times daily., Disp: 180 capsule, Rfl: 3   triamcinolone cream (KENALOG) 0.1 %, Apply 1 application topically daily., Disp: 60 g, Rfl: 2  Observations/Objective: Patient is well-developed, well-nourished in no acute distress.  Resting comfortably at home.  Head is normocephalic, atraumatic.  No labored breathing.  Speech is clear and coherent with logical content.  Patient is alert and oriented at baseline.   Dry, frequent coughing heard   Assessment and Plan: 1. Acute bacterial bronchitis - levofloxacin (LEVAQUIN) 500 MG tablet; Take 1 tablet (500 mg total) by mouth daily for 7 days.  Dispense: 7 tablet; Refill: 0 - predniSONE (DELTASONE) 20 MG tablet; Take 1 tablet (20 mg total) by mouth daily with breakfast.  Dispense: 7 tablet; Refill: 0 - promethazine-dextromethorphan (PROMETHAZINE-DM) 6.25-15 MG/5ML syrup; Take 5 mLs by mouth 4 (four) times daily as needed.  Dispense: 118 mL; Refill: 0 - benzonatate (TESSALON) 100 MG capsule; Take 1 capsule (100 mg total) by mouth 3 (three) times daily as needed.  Dispense: 30 capsule; Refill: 0  2. Acute bacterial sinusitis  - Worsening symptoms over a week and a half - Suspect bacterial sinusitis and bronchitis - Start Levaquin - Add prednisone - Tessalon perles and Promethazine DM for cough - Steam treatments and humidifier can help - Push fluids - Rest - Seek in person evaluation if symptoms continue to worsen  or fail to improve  Follow Up Instructions: I discussed the assessment and treatment plan with the patient. The patient was provided an opportunity to ask questions and all were answered. The patient agreed with the plan and demonstrated an understanding of the instructions.  A copy of instructions were sent to the patient via MyChart unless otherwise noted below.   The patient was advised to call back or seek an in-person evaluation if the symptoms worsen or if the condition fails to improve as anticipated.  Time:  I spent 18 minutes with the patient via telehealth technology discussing the above problems/concerns.    Mar Daring, PA-C

## 2021-08-31 NOTE — Patient Instructions (Signed)
Tricia Dodson, thank you for joining Margaretann Loveless, PA-C for today's virtual visit.  While this provider is not your primary care provider (PCP), if your PCP is located in our provider database this encounter information will be shared with them immediately following your visit.  Consent: (Patient) Tricia Dodson provided verbal consent for this virtual visit at the beginning of the encounter.  Current Medications:  Current Outpatient Medications:    benzonatate (TESSALON) 100 MG capsule, Take 1 capsule (100 mg total) by mouth 3 (three) times daily as needed., Disp: 30 capsule, Rfl: 0   levofloxacin (LEVAQUIN) 500 MG tablet, Take 1 tablet (500 mg total) by mouth daily for 7 days., Disp: 7 tablet, Rfl: 0   predniSONE (DELTASONE) 20 MG tablet, Take 1 tablet (20 mg total) by mouth daily with breakfast., Disp: 7 tablet, Rfl: 0   promethazine-dextromethorphan (PROMETHAZINE-DM) 6.25-15 MG/5ML syrup, Take 5 mLs by mouth 4 (four) times daily as needed., Disp: 118 mL, Rfl: 0   amitriptyline (ELAVIL) 10 MG tablet, TAKE 1 TABLET(10 MG) BY MOUTH AT BEDTIME, Disp: 90 tablet, Rfl: 3   atorvastatin (LIPITOR) 10 MG tablet, Take 1 tablet (10 mg total) by mouth daily., Disp: 90 tablet, Rfl: 3   DULoxetine (CYMBALTA) 20 MG capsule, Take 1 capsule by mouth daily. Holding for now. (Patient not taking: Reported on 07/18/2021), Disp: , Rfl:    empagliflozin (JARDIANCE) 10 MG TABS tablet, TAKE 1 TABLET(10 MG) BY MOUTH DAILY BEFORE BREAKFAST, Disp: 90 tablet, Rfl: 1   fenofibrate micronized (LOFIBRA) 134 MG capsule, Take 1 capsule (134 mg total) by mouth daily before breakfast., Disp: 90 capsule, Rfl: 3   gabapentin (NEURONTIN) 300 MG capsule, Take 1 capsule (300 mg total) by mouth 2 (two) times daily., Disp: 180 capsule, Rfl: 3   levocetirizine (XYZAL) 5 MG tablet, Take 1 tablet (5 mg total) by mouth daily., Disp: 90 tablet, Rfl: 3   levothyroxine (SYNTHROID) 25 MCG tablet, TAKE 1 TABLET(25 MCG) BY MOUTH  DAILY, Disp: 90 tablet, Rfl: 3   losartan (COZAAR) 50 MG tablet, TAKE 1 TABLET(50 MG) BY MOUTH DAILY, Disp: 90 tablet, Rfl: 3   omeprazole (PRILOSEC) 40 MG capsule, Take 1 capsule (40 mg total) by mouth 2 (two) times daily., Disp: 180 capsule, Rfl: 3   triamcinolone cream (KENALOG) 0.1 %, Apply 1 application topically daily., Disp: 60 g, Rfl: 2   Medications ordered in this encounter:  Meds ordered this encounter  Medications   levofloxacin (LEVAQUIN) 500 MG tablet    Sig: Take 1 tablet (500 mg total) by mouth daily for 7 days.    Dispense:  7 tablet    Refill:  0    Order Specific Question:   Supervising Provider    Answer:   MILLER, BRIAN [[id number]]   predniSONE (DELTASONE) 20 MG tablet    Sig: Take 1 tablet (20 mg total) by mouth daily with breakfast.    Dispense:  7 tablet    Refill:  0    Order Specific Question:   Supervising Provider    Answer:   Eber Hong [[id number]]   promethazine-dextromethorphan (PROMETHAZINE-DM) 6.25-15 MG/5ML syrup    Sig: Take 5 mLs by mouth 4 (four) times daily as needed.    Dispense:  118 mL    Refill:  0    Order Specific Question:   Supervising Provider    Answer:   MILLER, BRIAN [[id number]]   benzonatate (TESSALON) 100 MG capsule    Sig: Take 1 capsule (100 mg total)  by mouth 3 (three) times daily as needed.    Dispense:  30 capsule    Refill:  0    Order Specific Question:   Supervising Provider    Answer:   Hyacinth Meeker, BRIAN [[id number]]     *If you need refills on other medications prior to your next appointment, please contact your pharmacy*  Follow-Up: Call back or seek an in-person evaluation if the symptoms worsen or if the condition fails to improve as anticipated.  Other Instructions Acute Bronchitis, Adult Acute bronchitis is sudden inflammation of the main airways (bronchi) that come off the windpipe (trachea) in the lungs. The swelling causes the airways to get smaller and make more mucus than normal. This can make it hard to breathe and can cause  coughing or noisy breathing (wheezing). Acute bronchitis may last several weeks. The cough may last longer. Allergies, asthma, and exposure to smoke may make the condition worse. What are the causes? This condition can be caused by germs and by substances that irritate the lungs, including: Cold and flu viruses. The most common cause of this condition is the virus that causes the common cold. Bacteria. This is less common. Breathing in substances that irritate the lungs, including: Smoke from cigarettes and other forms of tobacco. Dust and pollen. Fumes from household cleaning products, gases, or burned fuel. Indoor or outdoor air pollution. What increases the risk? The following factors may make you more likely to develop this condition: A weak body's defense system, also called the immune system. A condition that affects your lungs and breathing, such as asthma. What are the signs or symptoms? Common symptoms of this condition include: Coughing. This may bring up clear, yellow, or green mucus from your lungs (sputum). Wheezing. Runny or stuffy nose. Having too much mucus in your lungs (chest congestion). Shortness of breath. Aches and pains, including sore throat or chest. How is this diagnosed? This condition is usually diagnosed based on: Your symptoms and medical history. A physical exam. You may also have other tests, including tests to rule out other conditions, such as pneumonia. These tests include: A test of lung function. Test of a mucus sample to look for the presence of bacteria. Tests to check the oxygen level in your blood. Blood tests. Chest X-ray. How is this treated? Most cases of acute bronchitis clear up over time without treatment. Your health care provider may recommend: Drinking more fluids to help thin your mucus so it is easier to cough up. Taking inhaled medicine (inhaler) to improve air flow in and out of your lungs. Using a vaporizer or a humidifier.  These are machines that add water to the air to help you breathe better. Taking a medicine that thins mucus and clears congestion (expectorant). Taking a medicine that prevents or stops coughing (cough suppressant). It is notcommon to take an antibiotic medicine for this condition. Follow these instructions at home:  Take over-the-counter and prescription medicines only as told by your health care provider. Use an inhaler, vaporizer, or humidifier as told by your health care provider. Take two teaspoons (10 mL) of honey at bedtime to lessen coughing at night. Drink enough fluid to keep your urine pale yellow. Do not use any products that contain nicotine or tobacco. These products include cigarettes, chewing tobacco, and vaping devices, such as e-cigarettes. If you need help quitting, ask your health care provider. Get plenty of rest. Return to your normal activities as told by your health care provider. Ask your health care  provider what activities are safe for you. Keep all follow-up visits. This is important. How is this prevented? To lower your risk of getting this condition again: Wash your hands often with soap and water for at least 20 seconds. If soap and water are not available, use hand sanitizer. Avoid contact with people who have cold symptoms. Try not to touch your mouth, nose, or eyes with your hands. Avoid breathing in smoke or chemical fumes. Breathing smoke or chemical fumes will make your condition worse. Get the flu shot every year. Contact a health care provider if: Your symptoms do not improve after 2 weeks. You have trouble coughing up the mucus. Your cough keeps you awake at night. You have a fever. Get help right away if you: Cough up blood. Feel pain in your chest. Have severe shortness of breath. Faint or keep feeling like you are going to faint. Have a severe headache. Have a fever or chills that get worse. These symptoms may represent a serious problem  that is an emergency. Do not wait to see if the symptoms will go away. Get medical help right away. Call your local emergency services (911 in the [place] Do not drive yourself to the hospital. Summary Acute bronchitis is inflammation of the main airways (bronchi) that come off the windpipe (trachea) in the lungs. The swelling causes the airways to get smaller and make more mucus than normal. Drinking more fluids can help thin your mucus so it is easier to cough up. Take over-the-counter and prescription medicines only as told by your health care provider. Do not use any products that contain nicotine or tobacco. These products include cigarettes, chewing tobacco, and vaping devices, such as e-cigarettes. If you need help quitting, ask your health care provider. Contact a health care provider if your symptoms do not improve after 2 weeks. This information is not intended to replace advice given to you by your health care provider. Make sure you discuss any questions you have with your health care provider. Document Revised: 10/26/2020 Document Reviewed: 10/26/2020 Elsevier Patient Education  2022 [employer].    If you have been instructed to have an in-person evaluation today at a local Urgent Care facility, please use the link below. It will take you to a list of all of our available [facility], including address, phone number and hours of operation. Please do not delay care.  [facility]  If you or a family member do not have a primary care provider, use the link below to schedule a visit and establish care. When you choose a [facility] primary care physician or advanced practice provider, you gain a long-term partner in health. Find a Primary Care Provider  Learn more about [facility]'s in-office and virtual care options: [facility] - Get Care Now

## 2021-09-04 DIAGNOSIS — G4733 Obstructive sleep apnea (adult) (pediatric): Secondary | ICD-10-CM | POA: Diagnosis not present

## 2021-09-12 ENCOUNTER — Ambulatory Visit (INDEPENDENT_AMBULATORY_CARE_PROVIDER_SITE_OTHER): Payer: Medicare Other | Admitting: Family

## 2021-09-12 VITALS — BP 110/72 | HR 85 | Temp 97.5°F | Resp 18 | Ht 67.0 in | Wt 245.6 lb

## 2021-09-12 DIAGNOSIS — H6983 Other specified disorders of Eustachian tube, bilateral: Secondary | ICD-10-CM | POA: Diagnosis not present

## 2021-09-12 DIAGNOSIS — J208 Acute bronchitis due to other specified organisms: Secondary | ICD-10-CM | POA: Diagnosis not present

## 2021-09-12 DIAGNOSIS — B9689 Other specified bacterial agents as the cause of diseases classified elsewhere: Secondary | ICD-10-CM

## 2021-09-12 DIAGNOSIS — H6993 Unspecified Eustachian tube disorder, bilateral: Secondary | ICD-10-CM

## 2021-09-12 MED ORDER — LEVOFLOXACIN 500 MG PO TABS: 500.0000 mg | ORAL_TABLET | Freq: Every day | ORAL | 0 refills | Status: DC

## 2021-09-12 MED ORDER — BENZONATATE 100 MG PO CAPS: 100.0000 mg | ORAL_CAPSULE | Freq: Three times a day (TID) | ORAL | 0 refills | Status: DC | PRN

## 2021-09-12 MED ORDER — PREDNISONE 20 MG PO TABS: 20.0000 mg | ORAL_TABLET | Freq: Every day | ORAL | 0 refills | Status: DC

## 2021-09-12 NOTE — Progress Notes (Signed)
?Tricia Dodson is a 71 y.o. female with the following history as recorded in EpicCare:  ?Patient Active Problem List  ? Diagnosis Date Noted  ? Primary osteoarthritis of both knees 04/18/2021  ? Dizziness 12/02/2020  ? Nonspecific abnormal electrocardiogram (ECG) (EKG) 12/02/2020  ? UTI (urinary tract infection)   ? Thyroid disease   ? Hypertension   ? Hyperlipidemia   ? HA (headache)   ? GERD (gastroesophageal reflux disease)   ? Colon polyps   ? Chicken pox   ? Arthritis   ? Allergy   ? Morbid obesity ([place]) 11/30/2020  ? Retrognathia 11/30/2020  ? CPAP (continuous positive airway pressure) dependence 11/30/2020  ? OSA on CPAP 11/30/2020  ? CPAP use counseling 11/04/2020  ? Carpal tunnel syndrome, bilateral 11/04/2020  ? Plantar fasciitis, bilateral 11/04/2020  ? Torn ligament 11/04/2020  ? History of vein stripping 11/04/2020  ? Shingles 2019  ?  ?Current Outpatient Medications  ?Medication Sig Dispense Refill  ? amitriptyline (ELAVIL) 10 MG tablet TAKE 1 TABLET(10 MG) BY MOUTH AT BEDTIME 90 tablet 3  ? atorvastatin (LIPITOR) 10 MG tablet Take 1 tablet (10 mg total) by mouth daily. 90 tablet 3  ? empagliflozin (JARDIANCE) 10 MG TABS tablet TAKE 1 TABLET(10 MG) BY MOUTH DAILY BEFORE BREAKFAST 90 tablet 1  ? fenofibrate micronized (LOFIBRA) 134 MG capsule Take 1 capsule (134 mg total) by mouth daily before breakfast. 90 capsule 3  ? gabapentin (NEURONTIN) 300 MG capsule Take 1 capsule (300 mg total) by mouth 2 (two) times daily. 180 capsule 3  ? levocetirizine (XYZAL) 5 MG tablet Take 1 tablet (5 mg total) by mouth daily. 90 tablet 3  ? levofloxacin (LEVAQUIN) 500 MG tablet Take 1 tablet (500 mg total) by mouth daily. 5 tablet 0  ? levothyroxine (SYNTHROID) 25 MCG tablet TAKE 1 TABLET(25 MCG) BY MOUTH DAILY 90 tablet 3  ? losartan (COZAAR) 50 MG tablet TAKE 1 TABLET(50 MG) BY MOUTH DAILY 90 tablet 3  ? omeprazole (PRILOSEC) 40 MG capsule Take 1 capsule (40 mg total) by mouth 2 (two) times daily. 180 capsule 3  ?  predniSONE (DELTASONE) 20 MG tablet Take 1 tablet (20 mg total) by mouth daily with breakfast. 5 tablet 0  ? triamcinolone cream (KENALOG) 0.1 % Apply 1 application topically daily. 60 g 2  ? benzonatate (TESSALON) 100 MG capsule Take 1 capsule (100 mg total) by mouth 3 (three) times daily as needed. 30 capsule 0  ? DULoxetine (CYMBALTA) 20 MG capsule Take 1 capsule by mouth daily. Holding for now. (Patient not taking: Reported on 09/12/2021)    ? ?No current facility-administered medications for this visit.  ?  ?Allergies: Patient has no known allergies.  ?Past Medical History:  ?Diagnosis Date  ? Allergy   ? Arthritis   ? Carpal tunnel syndrome, bilateral 11/04/2020  ? Both wrists.   ? Chicken pox   ? Colon polyps   ? CPAP (continuous positive airway pressure) dependence 11/30/2020  ? CPAP use counseling 11/04/2020  ? GERD (gastroesophageal reflux disease)   ? HA (headache)   ? History of vein stripping 11/04/2020  ? Right leg  ? Hyperlipidemia   ? Hypertension   ? Morbid obesity ([place]) 11/30/2020  ? OSA on CPAP 11/30/2020  ? Plantar fasciitis, bilateral 11/04/2020  ? With Bone spors  ? Retrognathia 11/30/2020  ? Shingles 2019  ? Thyroid disease   ? Torn ligament 11/04/2020  ? Right knee  ? UTI (urinary tract infection)   ?  ?  Past Surgical History:  ?Procedure Laterality Date  ? BILATERAL CARPAL TUNNEL RELEASE    ? Hemorroids removed    ? KNEE ARTHROSCOPY Right   ? Plantar fasciatis     ? Repaired  ? right hand cyst removal    ? vein vascular    ? surgery  ?  ?Family History  ?Problem Relation Age of Onset  ? Arthritis Mother   ? Diabetes Father   ? Heart disease Father   ? Arthritis Sister   ? Diabetes Sister   ? Hyperlipidemia Sister   ? Hyperlipidemia Brother   ? Heart disease Brother   ?     triple heart bypass  ? Sleep apnea Brother   ?  ?Social History  ? ?Tobacco Use  ? Smoking status: Former  ? Smokeless tobacco: Never  ?Substance Use Topics  ? Alcohol use: Yes  ?  Comment: occ  ?  ?Subjective:  ? ?Patient was treated  for acute bronchitis over e-visit on 2/23 and given 7 days of Levaquin and Prednisone; does feel that she is improved but symptoms still lingering- notes that ears feel full; is concerned that medication was not given for long enough time;  ? ? ? ? ?Objective:  ?Vitals:  ? 09/12/21 0944  ?BP: 110/72  ?Pulse: 85  ?Resp: 18  ?Temp: (!) 97.5 ?F (36.4 ?C)  ?TempSrc: Oral  ?SpO2: 98%  ?Weight: 245 lb 9.6 oz (111.4 kg)  ?Height: 5\' 7"  (1.702 m)  ?  ?General: Well developed, well nourished, in no acute distress  ?Skin : Warm and dry.  ?Head: Normocephalic and atraumatic  ?Eyes: Sclera and conjunctiva clear; pupils round and reactive to light; extraocular movements intact  ?Ears: External normal; canals clear; tympanic membranes congested bilaterally ?Oropharynx: Pink, supple. No suspicious lesions  ?Neck: Supple without thyromegaly, adenopathy  ?Lungs: Respirations unlabored;  ?Neurologic: Alert and oriented; speech intact; face symmetrical; moves all extremities well; CNII-XII intact without focal deficit  ? ?Assessment:  ?1. Dysfunction of both eustachian tubes   ?2. Acute bacterial bronchitis   ?  ?Plan:  ?Extend Levaquin and oral prednisone x 5 more days; continue allergy medication as well; increase fluids, rest and follow up worse, no better; if symptoms persist, to consider going to ENT; ? ?This visit occurred during the SARS-CoV-2 public health emergency.  Safety protocols were in place, including screening questions prior to the visit, additional usage of staff PPE, and extensive cleaning of exam room while observing appropriate contact time as indicated for disinfecting solutions.  ? ? ?No follow-ups on file.  ?No orders of the defined types were placed in this encounter. ?  ?Requested Prescriptions  ? ?Signed Prescriptions Disp Refills  ? levofloxacin (LEVAQUIN) 500 MG tablet 5 tablet 0  ?  Sig: Take 1 tablet (500 mg total) by mouth daily.  ? predniSONE (DELTASONE) 20 MG tablet 5 tablet 0  ?  Sig: Take 1 tablet  (20 mg total) by mouth daily with breakfast.  ? benzonatate (TESSALON) 100 MG capsule 30 capsule 0  ?  Sig: Take 1 capsule (100 mg total) by mouth 3 (three) times daily as needed.  ?  ? ?

## 2021-09-15 DIAGNOSIS — G4733 Obstructive sleep apnea (adult) (pediatric): Secondary | ICD-10-CM | POA: Diagnosis not present

## 2021-10-02 DIAGNOSIS — G4733 Obstructive sleep apnea (adult) (pediatric): Secondary | ICD-10-CM | POA: Diagnosis not present

## 2021-10-13 ENCOUNTER — Other Ambulatory Visit: Payer: Self-pay | Admitting: Family

## 2021-10-20 ENCOUNTER — Telehealth: Payer: Self-pay | Admitting: Family

## 2021-10-20 NOTE — Telephone Encounter (Signed)
Left message for patient to call back and schedule Medicare Annual Wellness Visit (AWV). Please offer to do virtually or by telephone.  Left office number and my jabber #[phone]. ? ?Due for AWVI ? ?Please schedule at anytime with Nurse Health Advisor. ?  ?

## 2021-11-02 DIAGNOSIS — G4733 Obstructive sleep apnea (adult) (pediatric): Secondary | ICD-10-CM | POA: Diagnosis not present

## 2021-12-01 ENCOUNTER — Other Ambulatory Visit: Payer: Self-pay | Admitting: Family

## 2021-12-02 DIAGNOSIS — G4733 Obstructive sleep apnea (adult) (pediatric): Secondary | ICD-10-CM | POA: Diagnosis not present

## 2021-12-13 ENCOUNTER — Other Ambulatory Visit: Payer: Self-pay | Admitting: Family

## 2021-12-13 ENCOUNTER — Encounter: Payer: Self-pay | Admitting: Family

## 2021-12-13 MED ORDER — FENOFIBRATE 134 MG PO CAPS: 134.0000 mg | ORAL_CAPSULE | Freq: Every day | ORAL | 3 refills | Status: DC

## 2021-12-22 ENCOUNTER — Other Ambulatory Visit: Payer: Self-pay | Admitting: Family

## 2021-12-22 DIAGNOSIS — B9689 Other specified bacterial agents as the cause of diseases classified elsewhere: Secondary | ICD-10-CM

## 2021-12-22 MED ORDER — BENZONATATE 100 MG PO CAPS: 100.0000 mg | ORAL_CAPSULE | Freq: Three times a day (TID) | ORAL | 0 refills | Status: DC | PRN

## 2022-01-02 DIAGNOSIS — G4733 Obstructive sleep apnea (adult) (pediatric): Secondary | ICD-10-CM | POA: Diagnosis not present

## 2022-01-10 ENCOUNTER — Encounter: Payer: Self-pay | Admitting: Family

## 2022-01-10 ENCOUNTER — Other Ambulatory Visit: Payer: Self-pay | Admitting: Family

## 2022-01-10 MED ORDER — GABAPENTIN 300 MG PO CAPS: 300.0000 mg | ORAL_CAPSULE | Freq: Three times a day (TID) | ORAL | 0 refills | Status: DC

## 2022-01-25 ENCOUNTER — Encounter: Payer: Self-pay | Admitting: General Practice

## 2022-01-30 ENCOUNTER — Ambulatory Visit (INDEPENDENT_AMBULATORY_CARE_PROVIDER_SITE_OTHER): Payer: Medicare Other | Admitting: Family

## 2022-01-30 VITALS — BP 115/68 | HR 71 | Temp 97.7°F | Resp 16 | Ht 67.0 in | Wt 260.0 lb

## 2022-01-30 DIAGNOSIS — E119 Type 2 diabetes mellitus without complications: Secondary | ICD-10-CM | POA: Diagnosis not present

## 2022-01-30 DIAGNOSIS — E039 Hypothyroidism, unspecified: Secondary | ICD-10-CM | POA: Diagnosis not present

## 2022-01-30 DIAGNOSIS — Z1231 Encounter for screening mammogram for malignant neoplasm of breast: Secondary | ICD-10-CM | POA: Diagnosis not present

## 2022-01-30 DIAGNOSIS — G629 Polyneuropathy, unspecified: Secondary | ICD-10-CM | POA: Diagnosis not present

## 2022-01-30 LAB — COMPREHENSIVE METABOLIC PANEL
ALT: 22 U/L (ref 0–35)
AST: 21 U/L (ref 0–37)
Albumin: 4.2 g/dL (ref 3.5–5.2)
Alkaline Phosphatase: 63 U/L (ref 39–117)
BUN: 14 mg/dL (ref 6–23)
CO2: 30 mEq/L (ref 19–32)
Calcium: 9.5 mg/dL (ref 8.4–10.5)
Chloride: 103 mEq/L (ref 96–112)
Creatinine, Ser: 1.15 mg/dL (ref 0.40–1.20)
GFR: 48.1 mL/min — ABNORMAL LOW (ref >60.00)
Glucose, Bld: 136 mg/dL — ABNORMAL HIGH (ref 70–99)
Potassium: 4.3 mEq/L (ref 3.5–5.1)
Sodium: 141 mEq/L (ref 135–145)
Total Bilirubin: 0.5 mg/dL (ref 0.2–1.2)
Total Protein: 6.7 g/dL (ref 6.0–8.3)

## 2022-01-30 LAB — CBC WITH DIFFERENTIAL/PLATELET
Basophils Absolute: 0.1 10*3/uL (ref 0.0–0.1)
Basophils Relative: 0.8 % (ref 0.0–3.0)
Eosinophils Absolute: 0.2 10*3/uL (ref 0.0–0.7)
Eosinophils Relative: 2.4 % (ref 0.0–5.0)
HCT: 42.4 % (ref 36.0–46.0)
Hemoglobin: 14.1 g/dL (ref 12.0–15.0)
Lymphocytes Relative: 32.1 % (ref 12.0–46.0)
Lymphs Abs: 2.1 10*3/uL (ref 0.7–4.0)
MCHC: 33.3 g/dL (ref 30.0–36.0)
MCV: 85 fl (ref 78.0–100.0)
Monocytes Absolute: 0.3 10*3/uL (ref 0.1–1.0)
Monocytes Relative: 5.3 % (ref 3.0–12.0)
Neutro Abs: 3.9 10*3/uL (ref 1.4–7.7)
Neutrophils Relative %: 59.4 % (ref 43.0–77.0)
Platelets: 311 10*3/uL (ref 150.0–400.0)
RBC: 4.99 Mil/uL (ref 3.87–5.11)
RDW: 13.4 % (ref 11.5–15.5)
WBC: 6.5 10*3/uL (ref 4.0–10.5)

## 2022-01-30 LAB — LIPID PANEL
Cholesterol: 156 mg/dL (ref 0–200)
HDL: 41.8 mg/dL (ref >39.00)
NonHDL: 114.09
Total CHOL/HDL Ratio: 4
Triglycerides: 219 mg/dL — ABNORMAL HIGH (ref 0.0–149.0)
VLDL: 43.8 mg/dL — ABNORMAL HIGH (ref 0.0–40.0)

## 2022-01-30 LAB — LDL CHOLESTEROL, DIRECT: Direct LDL: 84 mg/dL

## 2022-01-30 LAB — HEMOGLOBIN A1C: Hgb A1c MFr Bld: 7.2 % — ABNORMAL HIGH (ref 4.6–6.5)

## 2022-01-30 LAB — MICROALBUMIN / CREATININE URINE RATIO
Creatinine,U: 36.4 mg/dL
Microalb Creat Ratio: 2.2 mg/g (ref 0.0–30.0)
Microalb, Ur: 0.8 mg/dL (ref 0.0–1.9)

## 2022-01-30 LAB — TSH: TSH: 2.05 u[IU]/mL (ref 0.35–5.50)

## 2022-01-30 MED ORDER — GABAPENTIN 300 MG PO CAPS: ORAL_CAPSULE | ORAL | 0 refills | Status: DC

## 2022-01-30 NOTE — Progress Notes (Signed)
Tricia Dodson is a 71 y.o. female with the following history as recorded in EpicCare:  Patient Active Problem List   Diagnosis Date Noted   Primary osteoarthritis of both knees 04/18/2021   Dizziness 12/02/2020   Nonspecific abnormal electrocardiogram (ECG) (EKG) 12/02/2020   UTI (urinary tract infection)    Thyroid disease    Hypertension    Hyperlipidemia    HA (headache)    GERD (gastroesophageal reflux disease)    Colon polyps    Chicken pox    Arthritis    Allergy    Morbid obesity ([place]) 11/30/2020   Retrognathia 11/30/2020   CPAP (continuous positive airway pressure) dependence 11/30/2020   OSA on CPAP 11/30/2020   CPAP use counseling 11/04/2020   Carpal tunnel syndrome, bilateral 11/04/2020   Plantar fasciitis, bilateral 11/04/2020   Torn ligament 11/04/2020   History of vein stripping 11/04/2020   Shingles 2019    Current Outpatient Medications  Medication Sig Dispense Refill   amitriptyline (ELAVIL) 10 MG tablet TAKE 1 TABLET(10 MG) BY MOUTH AT BEDTIME 90 tablet 3   atorvastatin (LIPITOR) 10 MG tablet Take 1 tablet (10 mg total) by mouth daily. 90 tablet 3   empagliflozin (JARDIANCE) 10 MG TABS tablet TAKE 1 TABLET(10 MG) BY MOUTH DAILY BEFORE BREAKFAST 90 tablet 0   fenofibrate micronized (LOFIBRA) 134 MG capsule Take 1 capsule (134 mg total) by mouth daily before breakfast. 90 capsule 3   levocetirizine (XYZAL) 5 MG tablet Take 1 tablet (5 mg total) by mouth daily. 90 tablet 3   levothyroxine (SYNTHROID) 25 MCG tablet TAKE 1 TABLET(25 MCG) BY MOUTH DAILY 90 tablet 3   losartan (COZAAR) 50 MG tablet TAKE 1 TABLET(50 MG) BY MOUTH DAILY 90 tablet 3   omeprazole (PRILOSEC) 40 MG capsule Take 1 capsule (40 mg total) by mouth 2 (two) times daily. 180 capsule 3   triamcinolone cream (KENALOG) 0.1 % Apply 1 application topically daily. 60 g 2   DULoxetine (CYMBALTA) 20 MG capsule Take 1 capsule by mouth daily. Holding for now. (Patient not taking: Reported on 09/12/2021)      gabapentin (NEURONTIN) 300 MG capsule TAKE 1 CAPSULE(300 MG) BY MOUTH TWICE DAILY 180 capsule 0   No current facility-administered medications for this visit.    Allergies: Patient has no known allergies.  Past Medical History:  Diagnosis Date   Allergy    Arthritis    Carpal tunnel syndrome, bilateral 11/04/2020   Both wrists.    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   CPAP use counseling 11/04/2020   GERD (gastroesophageal reflux disease)    HA (headache)    History of vein stripping 11/04/2020   Right leg   Hyperlipidemia    Hypertension    Morbid obesity ([place]) 11/30/2020   OSA on CPAP 11/30/2020   Plantar fasciitis, bilateral 11/04/2020   With Bone spors   Retrognathia 11/30/2020   Shingles 2019   Thyroid disease    Torn ligament 11/04/2020   Right knee   UTI (urinary tract infection)     Past Surgical History:  Procedure Laterality Date   BILATERAL CARPAL TUNNEL RELEASE     Hemorroids removed     KNEE ARTHROSCOPY Right    Plantar fasciatis      Repaired   right hand cyst removal     vein vascular     surgery    Family History  Problem Relation Age of Onset  Arthritis Mother    Diabetes Father    Heart disease Father    Arthritis Sister    Diabetes Sister    Hyperlipidemia Sister    Hyperlipidemia Brother    Heart disease Brother        triple heart bypass   Sleep apnea Brother     Social History   Tobacco Use   Smoking status: Former   Smokeless tobacco: Never  Substance Use Topics   Alcohol use: Yes    Comment: occ    Subjective:   6 month follow up on chronic care needs; no acute concerns today; will be traveling to [place], [place] and [place] in the next year;  Due for mammogram;  Denies any chest pain, shortness of breath, blurred vision or headache    Objective:  Vitals:   01/30/22 0944  BP: 115/68  Pulse: 71  Resp: 16  Temp: 97.7 F (36.5 C)  TempSrc: Oral  SpO2: 95%   Weight: 260 lb (117.9 kg)  Height: 5' 7"  (1.702 m)    General: Well developed, well nourished, in no acute distress  Skin : Warm and dry.  Head: Normocephalic and atraumatic  Lungs: Respirations unlabored; clear to auscultation bilaterally without wheeze, rales, rhonchi  CVS exam: normal rate and regular rhythm.  Abdomen: Soft; nontender; nondistended; normoactive bowel sounds; no masses or hepatosplenomegaly  Musculoskeletal: No deformities; no active joint inflammation  Extremities: No edema, cyanosis, clubbing  Vessels: Symmetric bilaterally  Neurologic: Alert and oriented; speech intact; face symmetrical; moves all extremities well; CNII-XII intact without focal deficit   Assessment:  1. Type 2 diabetes mellitus without complication, without long-term current use of insulin ([place])   2. Hypothyroidism, unspecified type   3. Morbid obesity (H[facility]) Chronic  4. Visit for screening mammogram   5. Neuropathy     Plan:  Stable; labs updated; medication review done and no refills needed at this time; Order for mammogram updated; Congratulated patient on her commitment to her health; Follow up in 6 months, sooner prn.   No follow-ups on file.  Orders Placed This Encounter  Procedures   MM Digital Screening    Standing Status:   Future    Standing Expiration Date:   01/31/2023    Order Specific Question:   Reason for Exam (SYMPTOM  OR DIAGNOSIS REQUIRED)    Answer:   screening mammogram    Order Specific Question:   Preferred imaging location?    Answer:   [facility]   CBC with Differential/Platelet   Comp Met (CMET)   Lipid panel   TSH   Hemoglobin A1c   Urine Microalbumin w/creat. ratio    Requested Prescriptions   Signed Prescriptions Disp Refills   gabapentin (NEURONTIN) 300 MG capsule 180 capsule 0    Sig: TAKE 1 CAPSULE(300 MG) BY MOUTH TWICE DAILY

## 2022-02-01 DIAGNOSIS — G4733 Obstructive sleep apnea (adult) (pediatric): Secondary | ICD-10-CM | POA: Diagnosis not present

## 2022-02-07 DIAGNOSIS — G4733 Obstructive sleep apnea (adult) (pediatric): Secondary | ICD-10-CM | POA: Diagnosis not present

## 2022-03-04 DIAGNOSIS — G4733 Obstructive sleep apnea (adult) (pediatric): Secondary | ICD-10-CM | POA: Diagnosis not present

## 2022-03-29 ENCOUNTER — Telehealth: Payer: Self-pay | Admitting: Family

## 2022-03-29 ENCOUNTER — Encounter: Payer: Self-pay | Admitting: Family

## 2022-03-29 NOTE — Telephone Encounter (Signed)
error 

## 2022-03-29 NOTE — Telephone Encounter (Signed)
[facility] called to confirm receipt of form. Faxed form located and placed in provider's s drive.

## 2022-04-04 DIAGNOSIS — G4733 Obstructive sleep apnea (adult) (pediatric): Secondary | ICD-10-CM | POA: Diagnosis not present

## 2022-04-11 ENCOUNTER — Encounter: Payer: Self-pay | Admitting: *Deleted

## 2022-04-12 ENCOUNTER — Ambulatory Visit (INDEPENDENT_AMBULATORY_CARE_PROVIDER_SITE_OTHER): Payer: Medicare Other | Admitting: Adult Health

## 2022-04-12 VITALS — BP 147/84 | HR 74 | Ht 67.0 in | Wt 252.8 lb

## 2022-04-12 DIAGNOSIS — G4733 Obstructive sleep apnea (adult) (pediatric): Secondary | ICD-10-CM

## 2022-04-12 NOTE — Progress Notes (Signed)
PATIENT: Tricia Dodson DOB: 1951-04-21  REASON FOR VISIT: follow up HISTORY FROM: patient PRIMARY NEUROLOGIST: Dr. Brett Fairy  Chief Complaint  Patient presents with   Follow-up    Rm 19, alone     HISTORY OF PRESENT ILLNESS: Today 04/12/22:  [name] is a 71 year old female with a history of OSA on CPAP.  She brought in her card today but unfortunately cannot get a download.  She states CPAP is Better with pressure changes. Uses machine nightly. When she travels she takes a smaller machine and that would explain gaps in usage. Lately a little more sleepy but doesn't think its related to CPAP but insomnia. Retired but will be starting a job with [employer].   04/11/21: [name] is a 71 year old female with a history of obstructive sleep apnea on CPAP.  Her download indicates that she use her machine 29 out of 30 days for compliance of 97%.  She use her machine greater than 4 hours each night.  On average she uses her machine 8 hours and 7 minutes.  Her residual AHI is 1.1 on 5-17 cmH2O. she does not have a significant leak.  She states that when she puts her mask on she feels that it does not give her complete air before it stops.  She states that this lasted for about 10 to 15 minutes and then the machine works fine.  HISTORY (Copied from Dr.Dohmeier's note)  Tricia Dodson is a 6 - year- old Caucasian female patient seen here upon FNP referral on 11/30/2020 from NP Valere Dross, her primary care provider.   Chief concern according to patient : see above     [name]  has a past medical history of Allergy, Arthritis, Chicken pox, Colon polyps, GERD (gastroesophageal reflux disease), HA (headache), Hyperlipidemia, Hypertension, Thyroid disease, and UTI (urinary tract infection)..   The patient had the first sleep study in the year 2005  with a result of OSA being found. She is on her third CPAP, 71 years old.  She had surgery for her foot and her  anesthetist asked her if she knew she had apnea, she did not. A year later she was tested. .    Sleep relevant medical history: Nocturia-none since CPAP-insomnia  In some nights. Obesity, sinus rhinitis-  Goiter.    Family medical /sleep history:Brother  on CPAP with OSA.    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  and lives in a household alone. No children. Pets are Present. 3 cats. 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  She had insomnia often at that time.  Tobacco use; none .   ETOH use; socially , Caffeine intake in form of Coffee( 1 cup in AM) Tea (not and iced -  3 or more a day) . Regular exercise in form of walking. Has knee pain.           Sleep habits are as follows: The patient's dinner time is between 6 PM. The patient goes to bed at 11-12 PM and continues to sleep for intervals of 3 hours, wakes for 30 minutes, reads in book, not bathroom breaks.   The preferred sleep position is laterally , with the support of one pillow.GERD in the past, not currently.  Dreams are reportedly infrequent. 8  AM is the usual rise time.  The patient wakes up spontaneously.  She reports not feeling refreshed or  restored in AM if not using CPAP-, but on CPAP she re rarely has  symptoms such as dry mouth , morning headaches , congestion- and residual fatigue.  Naps are taken rarely lasting from 15-30 minutes .      Reviews of CPAP download_  Tricia Dodson has used her CPAP compliantly for the last 30 days 29 out of 30 days with a compliance of over 4 hours for 90% of the time.  Average user time is 8 hours 28 minutes.  Her air sense 10 AutoSet which reportedly is about 57 years old or older as a serial #[id number] 06/[birth year]/1966 1.  It is on factory settings and was never tailored to her needs a minimum pressure is 4 maximum pressure is 20 EPR level is 3 cmH2O.  She has achieved a AHI residual of only 1.4 apneas and  hypopneas per hour of use with these being obstructive in origin.  Air leaks are moderate to mild at the 95th percentile 11.3 L/min.  The pressure at the 95th percentile is only 8.9 cmH2O.  She is using a nasal mask , N 20.     REVIEW OF SYSTEMS: Out of a complete 14 system review of symptoms, the patient complains only of the following symptoms, and all other reviewed systems are negative.  ESS 7  ALLERGIES: No Known Allergies  HOME MEDICATIONS: Outpatient Medications Prior to Visit  Medication Sig Dispense Refill   amitriptyline (ELAVIL) 10 MG tablet TAKE 1 TABLET(10 MG) BY MOUTH AT BEDTIME 90 tablet 3   amoxicillin (AMOXIL) 500 MG tablet Take 500 mg by mouth 3 (three) times daily.     atorvastatin (LIPITOR) 10 MG tablet Take 1 tablet (10 mg total) by mouth daily. 90 tablet 3   DULoxetine (CYMBALTA) 20 MG capsule Take 1 capsule by mouth daily. Holding for now.     empagliflozin (JARDIANCE) 10 MG TABS tablet TAKE 1 TABLET(10 MG) BY MOUTH DAILY BEFORE BREAKFAST 90 tablet 0   fenofibrate micronized (LOFIBRA) 134 MG capsule Take 1 capsule (134 mg total) by mouth daily before breakfast. 90 capsule 3   gabapentin (NEURONTIN) 300 MG capsule TAKE 1 CAPSULE(300 MG) BY MOUTH TWICE DAILY 180 capsule 0   levocetirizine (XYZAL) 5 MG tablet Take 1 tablet (5 mg total) by mouth daily. 90 tablet 3   levothyroxine (SYNTHROID) 25 MCG tablet TAKE 1 TABLET(25 MCG) BY MOUTH DAILY 90 tablet 3   losartan (COZAAR) 50 MG tablet TAKE 1 TABLET(50 MG) BY MOUTH DAILY 90 tablet 3   omeprazole (PRILOSEC) 40 MG capsule Take 1 capsule (40 mg total) by mouth 2 (two) times daily. 180 capsule 3   triamcinolone cream (KENALOG) 0.1 % Apply 1 application topically daily. 60 g 2   acetaminophen-codeine (TYLENOL #3) 300-30 MG tablet Take 1 tablet by mouth every 6 (six) hours as needed. (Patient not taking: Reported on 04/12/2022)     No facility-administered medications prior to visit.    PAST MEDICAL HISTORY: Past Medical  History:  Diagnosis Date   Allergy    Arthritis    Carpal tunnel syndrome, bilateral 11/04/2020   Both wrists.    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   CPAP use counseling 11/04/2020   GERD (gastroesophageal reflux disease)    HA (headache)    History of vein stripping 11/04/2020   Right leg   Hyperlipidemia    Hypertension    Morbid obesity ([place]) 11/30/2020   OSA  on CPAP 11/30/2020   Plantar fasciitis, bilateral 11/04/2020   With Bone spors   Retrognathia 11/30/2020   Shingles 2019   Thyroid disease    Torn ligament 11/04/2020   Right knee   UTI (urinary tract infection)     PAST SURGICAL HISTORY: Past Surgical History:  Procedure Laterality Date   BILATERAL CARPAL TUNNEL RELEASE     Hemorroids removed     KNEE ARTHROSCOPY Right    Plantar fasciatis      Repaired   right hand cyst removal     vein vascular     surgery    FAMILY HISTORY: Family History  Problem Relation Age of Onset   Arthritis Mother    Diabetes Father    Heart disease Father    Arthritis Sister    Diabetes Sister    Hyperlipidemia Sister    Hyperlipidemia Brother    Heart disease Brother        triple heart bypass   Sleep apnea Brother     SOCIAL HISTORY: Social History   Socioeconomic History   Marital status: Single    Spouse name: Not on file   Number of children: Not on file   Years of education: Not on file   Highest education level: Not on file  Occupational History   Not on file  Tobacco Use   Smoking status: Former   Smokeless tobacco: Never  Vaping Use   Vaping Use: Never used  Substance and Sexual Activity   Alcohol use: Yes    Comment: occ   Drug use: Not on file   Sexual activity: Not Currently  Other Topics Concern   Not on file  Social History Narrative   Not on file   Social Determinants of Health   Financial Resource Strain: Not on file  Food Insecurity: Not on file  Transportation Needs: Not on  file  Physical Activity: Not on file  Stress: Not on file  Social Connections: Not on file  Intimate Partner Violence: Not on file      PHYSICAL EXAM  Vitals:   04/12/22 1119  BP: (!) 147/84  Pulse: 74  Weight: 252 lb 12.8 oz (114.7 kg)  Height: 5\' 7"  (1.702 m)    Body mass index is 39.59 kg/m.  Generalized: Well developed, in no acute distress  Chest: Lungs clear to auscultation bilaterally  Neurological examination  Mentation: Alert oriented to time, place, history taking. Follows all commands speech and language fluent Cranial nerve II-XII: Extraocular movements were full, visual field were full on confrontational test Head turning and shoulder shrug  were normal and symmetric.  Gait and station: Gait is normal.    DIAGNOSTIC DATA (LABS, IMAGING, TESTING) - I reviewed patient records, labs, notes, testing and imaging myself where available.  Lab Results  Component Value Date   WBC 6.5 01/30/2022   HGB 14.1 01/30/2022   HCT 42.4 01/30/2022   MCV 85.0 01/30/2022   PLT 311.0 01/30/2022      Component Value Date/Time   NA 141 01/30/2022 1030   K 4.3 01/30/2022 1030   CL 103 01/30/2022 1030   CO2 30 01/30/2022 1030   GLUCOSE 136 (H) 01/30/2022 1030   BUN 14 01/30/2022 1030   CREATININE 1.15 01/30/2022 1030   CALCIUM 9.5 01/30/2022 1030   PROT 6.7 01/30/2022 1030   ALBUMIN 4.2 01/30/2022 1030   AST 21 01/30/2022 1030   ALT 22 01/30/2022 1030   ALKPHOS 63 01/30/2022 1030   BILITOT 0.5  01/30/2022 1030   Lab Results  Component Value Date   CHOL 156 01/30/2022   HDL 41.80 01/30/2022   LDLCALC 77 01/06/2021   LDLDIRECT 84.0 01/30/2022   TRIG 219.0 (H) 01/30/2022   CHOLHDL 4 01/30/2022   Lab Results  Component Value Date   HGBA1C 7.2 (H) 01/30/2022   No results found for: "VITAMINB12" Lab Results  Component Value Date   TSH 2.05 01/30/2022      ASSESSMENT AND PLAN 71 y.o. year old female  has a past medical history of Allergy, Arthritis, Carpal  tunnel syndrome, bilateral (11/04/2020), Chicken pox, Colon polyps, CPAP (continuous positive airway pressure) dependence (11/30/2020), CPAP use counseling (11/04/2020), GERD (gastroesophageal reflux disease), HA (headache), History of vein stripping (11/04/2020), Hyperlipidemia, Hypertension, Morbid obesity ([place]) (11/30/2020), OSA on CPAP (11/30/2020), Plantar fasciitis, bilateral (11/04/2020), Retrognathia (11/30/2020), Shingles (2019), Thyroid disease, Torn ligament (11/04/2020), and UTI (urinary tract infection). here with:  OSA on CPAP  - Patient will bring her machine by her office for download - Encourage patient to use CPAP nightly and > 4 hours each night - F/U in 1 year or sooner if needed   Ward Givens, MSN, NP-C 04/12/2022, 11:14 AM [facility] [address], [place], [place] [address] [phone]

## 2022-04-12 NOTE — Progress Notes (Signed)
f °

## 2022-05-01 ENCOUNTER — Ambulatory Visit (INDEPENDENT_AMBULATORY_CARE_PROVIDER_SITE_OTHER): Payer: Medicare Other | Admitting: Family

## 2022-05-01 ENCOUNTER — Encounter: Payer: Self-pay | Admitting: Family

## 2022-05-01 VITALS — BP 138/92 | HR 83 | Temp 97.7°F | Ht 67.0 in | Wt 253.0 lb

## 2022-05-01 DIAGNOSIS — E119 Type 2 diabetes mellitus without complications: Secondary | ICD-10-CM | POA: Diagnosis not present

## 2022-05-01 DIAGNOSIS — J069 Acute upper respiratory infection, unspecified: Secondary | ICD-10-CM | POA: Diagnosis not present

## 2022-05-01 DIAGNOSIS — H9313 Tinnitus, bilateral: Secondary | ICD-10-CM

## 2022-05-01 DIAGNOSIS — G629 Polyneuropathy, unspecified: Secondary | ICD-10-CM

## 2022-05-01 LAB — HM DIABETES EYE EXAM

## 2022-05-01 MED ORDER — BENZONATATE 100 MG PO CAPS: 100.0000 mg | ORAL_CAPSULE | Freq: Three times a day (TID) | ORAL | 0 refills | Status: DC | PRN

## 2022-05-01 MED ORDER — IPRATROPIUM BROMIDE 0.03 % NA SOLN: 2.0000 | Freq: Two times a day (BID) | NASAL | 0 refills | Status: AC

## 2022-05-01 MED ORDER — DULOXETINE HCL 20 MG PO CPEP: 20.0000 mg | ORAL_CAPSULE | Freq: Every day | ORAL | 3 refills | Status: DC

## 2022-05-01 MED ORDER — DOXYCYCLINE HYCLATE 100 MG PO TABS: 100.0000 mg | ORAL_TABLET | Freq: Two times a day (BID) | ORAL | 0 refills | Status: DC

## 2022-05-01 NOTE — Progress Notes (Signed)
Tricia Dodson is a 71 y.o. female with the following history as recorded in EpicCare:  Patient Active Problem List   Diagnosis Date Noted   Primary osteoarthritis of both knees 04/18/2021   Dizziness 12/02/2020   Nonspecific abnormal electrocardiogram (ECG) (EKG) 12/02/2020   UTI (urinary tract infection)    Thyroid disease    Hypertension    Hyperlipidemia    HA (headache)    GERD (gastroesophageal reflux disease)    Colon polyps    Chicken pox    Arthritis    Allergy    Morbid obesity (H[facility]) 11/30/2020   Retrognathia 11/30/2020   CPAP (continuous positive airway pressure) dependence 11/30/2020   OSA on CPAP 11/30/2020   CPAP use counseling 11/04/2020   Carpal tunnel syndrome, bilateral 11/04/2020   Plantar fasciitis, bilateral 11/04/2020   Torn ligament 11/04/2020   History of vein stripping 11/04/2020   Shingles 2019    Current Outpatient Medications  Medication Sig Dispense Refill   amitriptyline (ELAVIL) 10 MG tablet TAKE 1 TABLET(10 MG) BY MOUTH AT BEDTIME 90 tablet 3   atorvastatin (LIPITOR) 10 MG tablet Take 1 tablet (10 mg total) by mouth daily. 90 tablet 3   benzonatate (TESSALON) 100 MG capsule Take 1 capsule (100 mg total) by mouth 3 (three) times daily as needed. 20 capsule 0   doxycycline (VIBRA-TABS) 100 MG tablet Take 1 tablet (100 mg total) by mouth 2 (two) times daily. 14 tablet 0   empagliflozin (JARDIANCE) 10 MG TABS tablet TAKE 1 TABLET(10 MG) BY MOUTH DAILY BEFORE BREAKFAST 90 tablet 0   fenofibrate micronized (LOFIBRA) 134 MG capsule Take 1 capsule (134 mg total) by mouth daily before breakfast. 90 capsule 3   gabapentin (NEURONTIN) 300 MG capsule TAKE 1 CAPSULE(300 MG) BY MOUTH TWICE DAILY 180 capsule 0   ipratropium (ATROVENT) 0.03 % nasal spray Place 2 sprays into both nostrils every 12 (twelve) hours. 30 mL 0   levocetirizine (XYZAL) 5 MG tablet Take 1 tablet (5 mg total) by mouth daily. 90 tablet 3   levothyroxine (SYNTHROID) 25 MCG tablet TAKE 1  TABLET(25 MCG) BY MOUTH DAILY 90 tablet 3   losartan (COZAAR) 50 MG tablet TAKE 1 TABLET(50 MG) BY MOUTH DAILY 90 tablet 3   omeprazole (PRILOSEC) 40 MG capsule Take 1 capsule (40 mg total) by mouth 2 (two) times daily. 180 capsule 3   triamcinolone cream (KENALOG) 0.1 % Apply 1 application topically daily. 60 g 2   DULoxetine (CYMBALTA) 20 MG capsule Take 1 capsule (20 mg total) by mouth daily. 90 capsule 3   No current facility-administered medications for this visit.    Allergies: Patient has no known allergies.  Past Medical History:  Diagnosis Date   Allergy    Arthritis    Carpal tunnel syndrome, bilateral 11/04/2020   Both wrists.    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   Shingles 2019   Thyroid disease    Torn ligament 11/04/2020   Right knee   UTI (urinary tract infection)     Past Surgical History:  Procedure Laterality Date   BILATERAL CARPAL TUNNEL RELEASE     Hemorroids removed     KNEE ARTHROSCOPY Right    Plantar fasciatis      Repaired   right hand cyst removal     vein vascular     surgery    Family History  Problem Relation Age of Onset   Arthritis Mother    Diabetes Father    Heart disease Father    Arthritis Sister    Diabetes Sister    Hyperlipidemia Sister    Hyperlipidemia Brother    Heart disease Brother        triple heart bypass   Sleep apnea Brother     Social History   Tobacco Use   Smoking status: Former   Smokeless tobacco: Never  Substance Use Topics   Alcohol use: Yes    Comment: occ    Subjective:  Cough/ congestion/ sore throat x 5 days; notes that  co-worker had similar symptoms; Also requesting referral to ENT- feels that tinnitus is worsening; Would like to re-start Cymbalta 20 mg- was taking with clinical trial and would like to re-start;  Wants to follow up on how to get her mammogram scheduled- was ordered in July;      Objective:  Vitals:   05/01/22 1540 05/01/22 1612  BP: (!) 138/94 (!) 138/92  Pulse: 83   Temp: 97.7 F (36.5 C)   TempSrc: Oral   SpO2: 98%   Weight: 253 lb (114.8 kg)   Height: 5\' 7"  (1.702 m)     General: Well developed, well nourished, in no acute distress  Skin : Warm and dry.  Head: Normocephalic and atraumatic  Eyes: Sclera and conjunctiva clear; pupils round and reactive to light; extraocular movements intact  Ears: External normal; canals clear; tympanic membranes normal  Oropharynx: Pink, supple. No suspicious lesions  Neck: Supple without thyromegaly, adenopathy  Lungs: Respirations unlabored; clear to auscultation bilaterally without wheeze, rales, rhonchi  CVS exam: normal rate and regular rhythm.  Neurologic: Alert and oriented; speech intact; face symmetrical; moves all extremities well; CNII-XII intact without focal deficit   Assessment:  1. Tinnitus of both ears   2. Neuropathy   3. Viral URI     Plan:  Refer to ENT; Re-start Cymbalta 20 mg daily; she is planning to reach out to her clinical trial group to discuss alternate treatment options; Will treat with combination of Ipratropium and Tessalon perles; if not improvement in 48 hours, to start Doxycycline;  She will get her mammogram scheduled- order is in place; I did also complete flu shot exemption for her to give to her employer- per patient, she was told by her previous provider that she should avoid flu shots since she has never taken one in her entire life;   No follow-ups on file.  Orders Placed This Encounter  Procedures   Ambulatory referral to ENT    Referral Priority:   Routine    Referral Type:   Consultation     Referral Reason:   Specialty Services Required    Requested Specialty:   Otolaryngology    Number of Visits Requested:   1   [facility] DIABETES EYE EXAM    This external order was created through the Results Console.    Requested Prescriptions   Signed Prescriptions Disp Refills   ipratropium (ATROVENT) 0.03 % nasal spray 30 mL 0    Sig: Place 2 sprays into both nostrils every 12 (twelve) hours.  benzonatate (TESSALON) 100 MG capsule 20 capsule 0    Sig: Take 1 capsule (100 mg total) by mouth 3 (three) times daily as needed.   doxycycline (VIBRA-TABS) 100 MG tablet 14 tablet 0    Sig: Take 1 tablet (100 mg total) by mouth 2 (two) times daily.   DULoxetine (CYMBALTA) 20 MG capsule 90 capsule 3    Sig: Take 1 capsule (20 mg total) by mouth daily.

## 2022-05-04 DIAGNOSIS — G4733 Obstructive sleep apnea (adult) (pediatric): Secondary | ICD-10-CM | POA: Diagnosis not present

## 2022-05-07 ENCOUNTER — Encounter (HOSPITAL_BASED_OUTPATIENT_CLINIC_OR_DEPARTMENT_OTHER): Payer: Self-pay

## 2022-05-07 ENCOUNTER — Ambulatory Visit (HOSPITAL_BASED_OUTPATIENT_CLINIC_OR_DEPARTMENT_OTHER)
Admission: RE | Admit: 2022-05-07 | Discharge: 2022-05-07 | Disposition: A | Discharge status: Home / Self Care | Payer: Medicare Other | Source: Ambulatory Visit | Attending: Family | Admitting: Family

## 2022-05-07 DIAGNOSIS — Z1231 Encounter for screening mammogram for malignant neoplasm of breast: Secondary | ICD-10-CM | POA: Diagnosis not present

## 2022-06-13 ENCOUNTER — Encounter: Payer: Self-pay | Admitting: Family

## 2022-06-14 ENCOUNTER — Other Ambulatory Visit: Payer: Self-pay | Admitting: Family

## 2022-06-14 MED ORDER — DAPAGLIFLOZIN PROPANEDIOL 10 MG PO TABS: 10.0000 mg | ORAL_TABLET | Freq: Every day | ORAL | 0 refills | Status: DC

## 2022-07-08 ENCOUNTER — Other Ambulatory Visit: Payer: Self-pay | Admitting: Family

## 2022-07-14 ENCOUNTER — Other Ambulatory Visit: Payer: Self-pay | Admitting: Family

## 2022-07-22 ENCOUNTER — Other Ambulatory Visit: Payer: Self-pay | Admitting: Family

## 2022-08-01 ENCOUNTER — Other Ambulatory Visit: Payer: Self-pay | Admitting: Family

## 2022-08-05 ENCOUNTER — Other Ambulatory Visit: Payer: Self-pay | Admitting: Family

## 2022-08-08 ENCOUNTER — Other Ambulatory Visit: Payer: Self-pay | Admitting: Family

## 2022-09-17 DIAGNOSIS — H903 Sensorineural hearing loss, bilateral: Secondary | ICD-10-CM | POA: Diagnosis not present

## 2022-09-17 DIAGNOSIS — H9313 Tinnitus, bilateral: Secondary | ICD-10-CM | POA: Diagnosis not present

## 2022-10-15 ENCOUNTER — Encounter: Payer: Self-pay | Admitting: Family

## 2022-10-16 MED ORDER — GABAPENTIN 300 MG PO CAPS: 300.0000 mg | ORAL_CAPSULE | Freq: Three times a day (TID) | ORAL | 0 refills | Status: DC

## 2022-10-19 IMAGING — DX DG LUMBAR SPINE COMPLETE 4+V
4 series · 5 of 5 positions shown · non-contrast
Comparison: None.

CLINICAL DATA: Low back pain with radiculopathy. Acute left low
back pain for 1.5 weeks.

EXAM:
LUMBAR SPINE - COMPLETE 4+ VIEW

[l-spine ap]
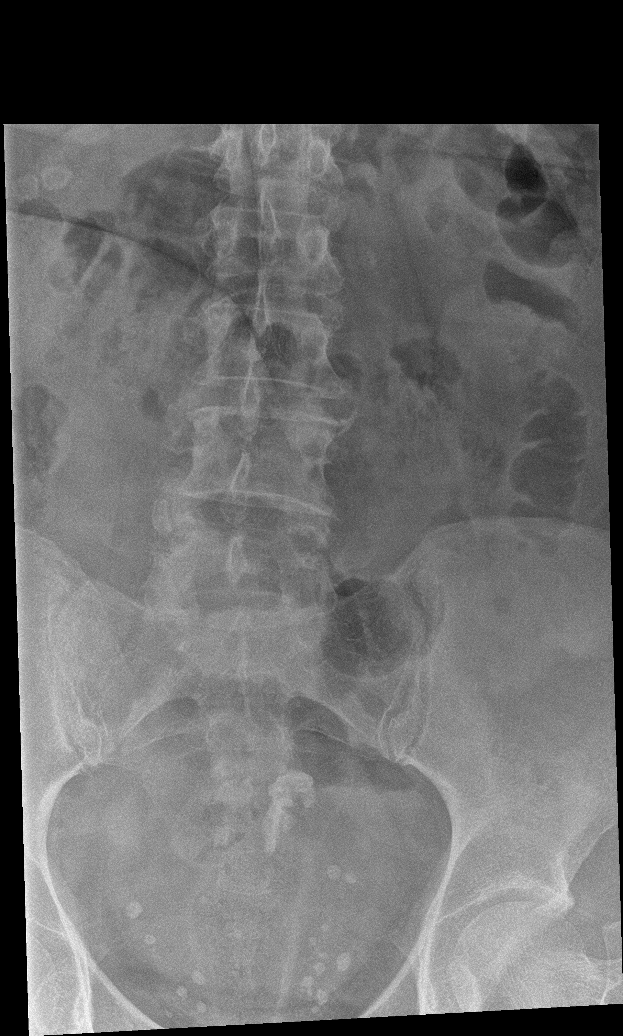

[Series 2: l-spine obl · 0.14mm/px · 2 of 2 slices shown]
[im 1/2]
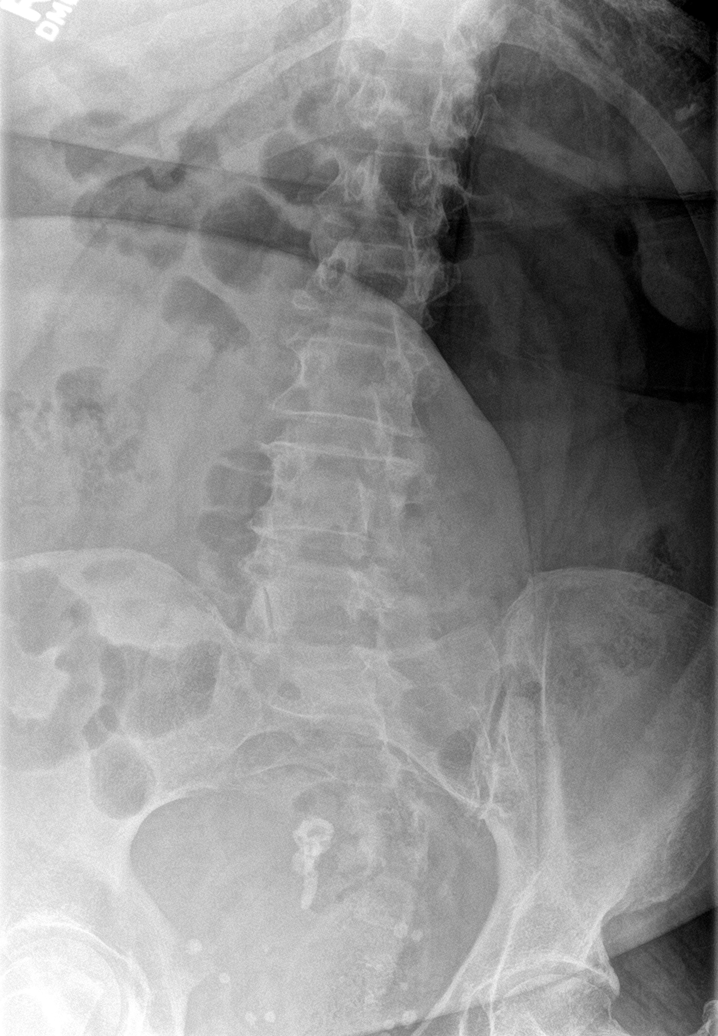
[im 2/2]
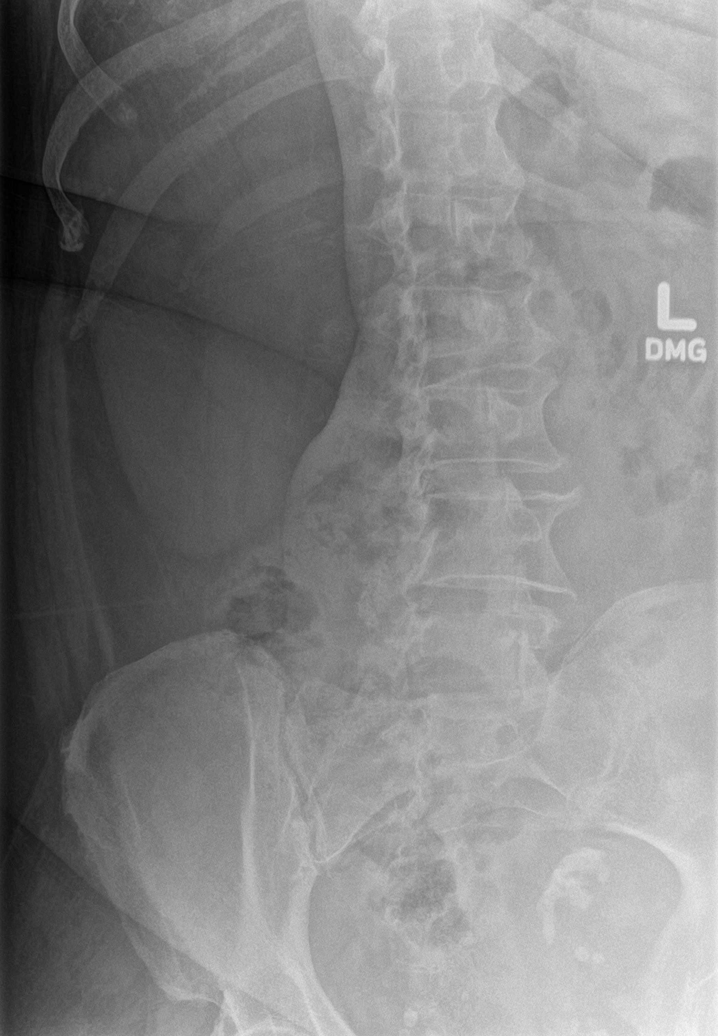

[l-spine lat]
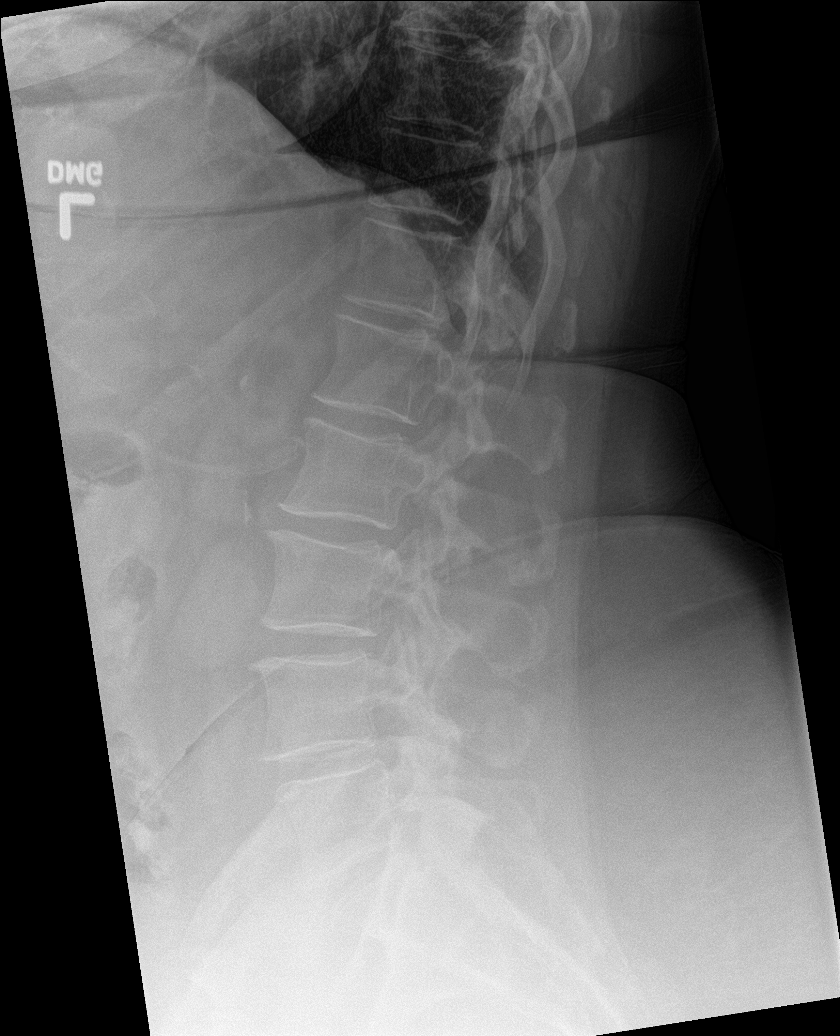

[l-spine spot]
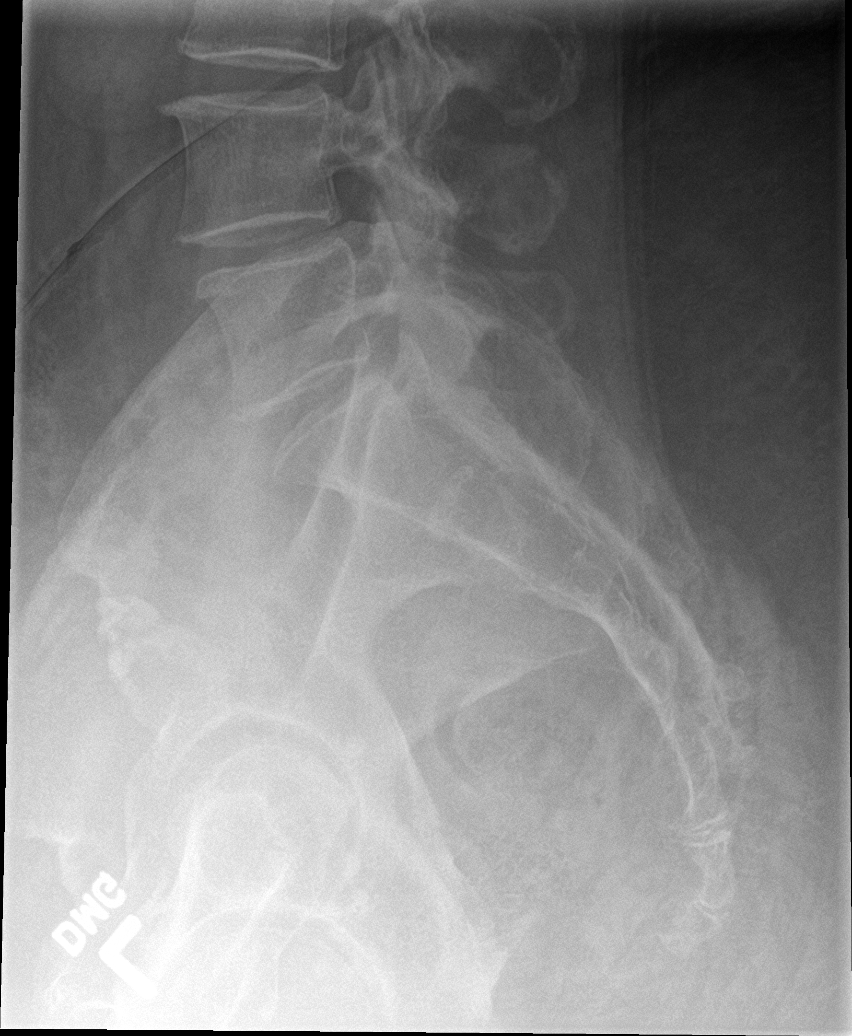

[5 of 5 positions shown; findings below may reference images not displayed]

FINDINGS: Five lumbar type vertebral bodies. Normal alignment. No vertebral
compression deformities. Degenerative changes with mild disc space
narrowing and endplate osteophyte formation throughout. Degenerative
changes in the facet joints. No focal bone lesion or bone
destruction. Visualized sacrum appears intact. Incidental note of
calcifications in the right upper quadrant, likely gallstones.
Calcified pelvic phleboliths. Central calcification in the pelvis
may represent a calcified fibroid.
IMPRESSION: Moderate degenerative changes in the lumbar spine. No acute
displaced fractures identified. Probable gallstones.

## 2022-10-22 ENCOUNTER — Encounter: Payer: Self-pay | Admitting: *Deleted

## 2022-10-29 ENCOUNTER — Other Ambulatory Visit: Payer: Self-pay | Admitting: Family

## 2022-11-23 ENCOUNTER — Ambulatory Visit: Payer: Medicare Other | Admitting: Family

## 2022-11-29 ENCOUNTER — Ambulatory Visit (INDEPENDENT_AMBULATORY_CARE_PROVIDER_SITE_OTHER): Payer: Medicare Other | Admitting: Family

## 2022-11-29 ENCOUNTER — Encounter: Payer: Self-pay | Admitting: Family

## 2022-11-29 VITALS — BP 132/80 | HR 70 | Ht 67.0 in | Wt 263.0 lb

## 2022-11-29 DIAGNOSIS — E119 Type 2 diabetes mellitus without complications: Secondary | ICD-10-CM

## 2022-11-29 DIAGNOSIS — Z7984 Long term (current) use of oral hypoglycemic drugs: Secondary | ICD-10-CM | POA: Diagnosis not present

## 2022-11-29 DIAGNOSIS — E039 Hypothyroidism, unspecified: Secondary | ICD-10-CM | POA: Diagnosis not present

## 2022-11-29 DIAGNOSIS — K130 Diseases of lips: Secondary | ICD-10-CM | POA: Diagnosis not present

## 2022-11-29 LAB — CBC WITH DIFFERENTIAL/PLATELET
Basophils Absolute: 0.1 10*3/uL (ref 0.0–0.1)
Basophils Relative: 1.2 % (ref 0.0–3.0)
Eosinophils Absolute: 0.2 10*3/uL (ref 0.0–0.7)
Eosinophils Relative: 2.1 % (ref 0.0–5.0)
HCT: 44.6 % (ref 36.0–46.0)
Hemoglobin: 14.5 g/dL (ref 12.0–15.0)
Lymphocytes Relative: 28 % (ref 12.0–46.0)
Lymphs Abs: 2.1 10*3/uL (ref 0.7–4.0)
MCHC: 32.4 g/dL (ref 30.0–36.0)
MCV: 85.8 fl (ref 78.0–100.0)
Monocytes Absolute: 0.4 10*3/uL (ref 0.1–1.0)
Monocytes Relative: 5.7 % (ref 3.0–12.0)
Neutro Abs: 4.8 10*3/uL (ref 1.4–7.7)
Neutrophils Relative %: 63 % (ref 43.0–77.0)
Platelets: 352 10*3/uL (ref 150.0–400.0)
RBC: 5.2 Mil/uL — ABNORMAL HIGH (ref 3.87–5.11)
RDW: 14 % (ref 11.5–15.5)
WBC: 7.6 10*3/uL (ref 4.0–10.5)

## 2022-11-29 LAB — LIPID PANEL
Cholesterol: 146 mg/dL (ref 0–200)
HDL: 44.5 mg/dL (ref >39.00)
LDL Cholesterol: 68 mg/dL (ref 0–99)
NonHDL: 101.26
Total CHOL/HDL Ratio: 3
Triglycerides: 164 mg/dL — ABNORMAL HIGH (ref 0.0–149.0)
VLDL: 32.8 mg/dL (ref 0.0–40.0)

## 2022-11-29 LAB — COMPREHENSIVE METABOLIC PANEL
ALT: 24 U/L (ref 0–35)
AST: 23 U/L (ref 0–37)
Albumin: 4.1 g/dL (ref 3.5–5.2)
Alkaline Phosphatase: 65 U/L (ref 39–117)
BUN: 18 mg/dL (ref 6–23)
CO2: 30 mEq/L (ref 19–32)
Calcium: 9.7 mg/dL (ref 8.4–10.5)
Chloride: 105 mEq/L (ref 96–112)
Creatinine, Ser: 1.15 mg/dL (ref 0.40–1.20)
GFR: 47.83 mL/min — ABNORMAL LOW (ref >60.00)
Glucose, Bld: 146 mg/dL — ABNORMAL HIGH (ref 70–99)
Potassium: 4.6 mEq/L (ref 3.5–5.1)
Sodium: 142 mEq/L (ref 135–145)
Total Bilirubin: 0.4 mg/dL (ref 0.2–1.2)
Total Protein: 6.5 g/dL (ref 6.0–8.3)

## 2022-11-29 LAB — HEMOGLOBIN A1C: Hgb A1c MFr Bld: 7 % — ABNORMAL HIGH (ref 4.6–6.5)

## 2022-11-29 LAB — TSH: TSH: 3.07 u[IU]/mL (ref 0.35–5.50)

## 2022-11-29 MED ORDER — NYSTATIN-TRIAMCINOLONE 100000-0.1 UNIT/GM-% EX OINT: 1.0000 | TOPICAL_OINTMENT | Freq: Two times a day (BID) | CUTANEOUS | 0 refills | Status: DC

## 2022-11-29 NOTE — Progress Notes (Signed)
Tricia Dodson is a 72 y.o. female with the following history as recorded in EpicCare:  Patient Active Problem List   Diagnosis Date Noted   Primary osteoarthritis of both knees 04/18/2021   Dizziness 12/02/2020   Nonspecific abnormal electrocardiogram (ECG) (EKG) 12/02/2020   UTI (urinary tract infection)    Thyroid disease    Hypertension    Hyperlipidemia    HA (headache)    GERD (gastroesophageal reflux disease)    Colon polyps    Chicken pox    Arthritis    Allergy    Morbid obesity (HCC) 11/30/2020   Retrognathia 11/30/2020   CPAP (continuous positive airway pressure) dependence 11/30/2020   OSA on CPAP 11/30/2020   CPAP use counseling 11/04/2020   Carpal tunnel syndrome, bilateral 11/04/2020   Plantar fasciitis, bilateral 11/04/2020   Torn ligament 11/04/2020   History of vein stripping 11/04/2020   Shingles 2019    Current Outpatient Medications  Medication Sig Dispense Refill   amitriptyline (ELAVIL) 10 MG tablet TAKE 1 TABLET(10 MG) BY MOUTH AT BEDTIME 90 tablet 3   atorvastatin (LIPITOR) 10 MG tablet TAKE 1 TABLET(10 MG) BY MOUTH DAILY 90 tablet 3   DULoxetine (CYMBALTA) 20 MG capsule Take 1 capsule (20 mg total) by mouth daily. 90 capsule 3   fenofibrate micronized (LOFIBRA) 134 MG capsule TAKE 1 CAPSULE(134 MG) BY MOUTH DAILY BEFORE BREAKFAST 90 capsule 1   gabapentin (NEURONTIN) 300 MG capsule Take 1 capsule (300 mg total) by mouth 3 (three) times daily. 180 capsule 0   ipratropium (ATROVENT) 0.03 % nasal spray Place 2 sprays into both nostrils every 12 (twelve) hours. 30 mL 0   JARDIANCE 10 MG TABS tablet TAKE 1 TABLET(10 MG) BY MOUTH DAILY BEFORE BREAKFAST 90 tablet 1   levocetirizine (XYZAL) 5 MG tablet TAKE 1 TABLET(5 MG) BY MOUTH DAILY 90 tablet 3   levothyroxine (SYNTHROID) 25 MCG tablet TAKE 1 TABLET(25 MCG) BY MOUTH DAILY 90 tablet 3   losartan (COZAAR) 50 MG tablet TAKE 1 TABLET(50 MG) BY MOUTH DAILY 90 tablet 3   nystatin-triamcinolone ointment  (MYCOLOG) Apply 1 Application topically 2 (two) times daily. 30 g 0   omeprazole (PRILOSEC) 40 MG capsule TAKE 1 CAPSULE(40 MG) BY MOUTH TWICE DAILY 180 capsule 3   triamcinolone cream (KENALOG) 0.1 % Apply 1 application topically daily. 60 g 2   benzonatate (TESSALON) 100 MG capsule Take 1 capsule (100 mg total) by mouth 3 (three) times daily as needed. (Patient not taking: Reported on 11/29/2022) 20 capsule 0   No current facility-administered medications for this visit.    Allergies: Patient has no known allergies.  Past Medical History:  Diagnosis Date   Allergy    Arthritis    Carpal tunnel syndrome, bilateral 11/04/2020   Both wrists.    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   Shingles 2019   Thyroid disease    Torn ligament 11/04/2020   Right knee   UTI (urinary tract infection)     Past Surgical History:  Procedure Laterality Date   BILATERAL CARPAL  TUNNEL RELEASE     Hemorroids removed     KNEE ARTHROSCOPY Right    Plantar fasciatis      Repaired   right hand cyst removal     vein vascular     surgery    Family History  Problem Relation Age of Onset   Arthritis Mother    Diabetes Father    Heart disease Father    Arthritis Sister    Diabetes Sister    Hyperlipidemia Sister    Hyperlipidemia Brother    Heart disease Brother        triple heart bypass   Sleep apnea Brother     Social History   Tobacco Use   Smoking status: Former   Smokeless tobacco: Never  Substance Use Topics   Alcohol use: Yes    Comment: occ    Subjective:   Patient presents with concerns for rash on side  of mouth x 10 days- describes as "dry/ painful"- seemed to start after returning from trip to [facility]. Overdue for follow up on chronic care needs; no acute concerns; Denies any chest pain, shortness of breath, blurred vision or headache   Objective:  Vitals:   11/29/22 0818  BP: 132/80  Pulse: 70  SpO2: 98%  Weight: 263 lb (119.3 kg)  Height: 5\' 7"  (1.702 m)    General: Well developed, well nourished, in no acute distress  Skin : Warm and dry. Mild erythema on right side of mouth Head: Normocephalic and atraumatic  Eyes: Sclera and conjunctiva clear; pupils round and reactive to light; extraocular movements intact  Ears: External normal; canals clear; tympanic membranes normal  Oropharynx: Pink, supple. No suspicious lesions  Neck: Supple without thyromegaly, adenopathy  Lungs: Respirations unlabored; clear to auscultation bilaterally without wheeze, rales, rhonchi  CVS exam: normal rate and regular rhythm.  Neurologic: Alert and oriented; speech intact; face symmetrical; moves all extremities well; CNII-XII intact without focal deficit   Assessment:  1. Type 2 diabetes mellitus without complication, without long-term current use of insulin (HCC)   2. Long term (current) use of oral hypoglycemic drugs   3. Hypothyroidism, unspecified type   4. Angular cheilitis     Plan:  Stable; update labs; will update refills as needed; 3.   Stable; updated labs;  4.   Reassurance; trial of Mycolog II apply small amount bid to affected area;   Follow up in 6 months;   No follow-ups on file.  Orders Placed This Encounter  Procedures   CBC with Differential/Platelet   Comp Met (CMET)   Lipid panel   Hemoglobin A1c   TSH    Requested Prescriptions   Signed Prescriptions Disp Refills   nystatin-triamcinolone ointment (MYCOLOG) 30 g 0    Sig: Apply 1 Application topically 2 (two) times daily.

## 2022-11-29 NOTE — Patient Instructions (Signed)
[email or web address]

## 2022-11-30 ENCOUNTER — Other Ambulatory Visit: Payer: Self-pay | Admitting: Family

## 2022-11-30 ENCOUNTER — Encounter: Payer: Self-pay | Admitting: Family

## 2022-11-30 DIAGNOSIS — G4733 Obstructive sleep apnea (adult) (pediatric): Secondary | ICD-10-CM | POA: Diagnosis not present

## 2022-11-30 DIAGNOSIS — N898 Other specified noninflammatory disorders of vagina: Secondary | ICD-10-CM

## 2022-11-30 MED ORDER — NYSTATIN 100000 UNIT/GM EX CREA: 1.0000 | TOPICAL_CREAM | Freq: Two times a day (BID) | CUTANEOUS | 0 refills | Status: DC

## 2022-11-30 MED ORDER — TRIAMCINOLONE ACETONIDE 0.1 % EX CREA
1.0000 | TOPICAL_CREAM | Freq: Two times a day (BID) | CUTANEOUS | 2 refills | Status: DC
Start: 2022-11-30 — End: 2023-07-08

## 2022-12-03 ENCOUNTER — Other Ambulatory Visit: Payer: Self-pay | Admitting: Family

## 2022-12-10 IMAGING — US US ABDOMEN COMPLETE
1 series · 14 of 25 positions shown · non-contrast
Comparison: None.

CLINICAL DATA: Abdominal pain

EXAM:
ABDOMEN ULTRASOUND COMPLETE

[Series 1: us abdomen complete · 14 of 73 slices shown]
[im 1/73]
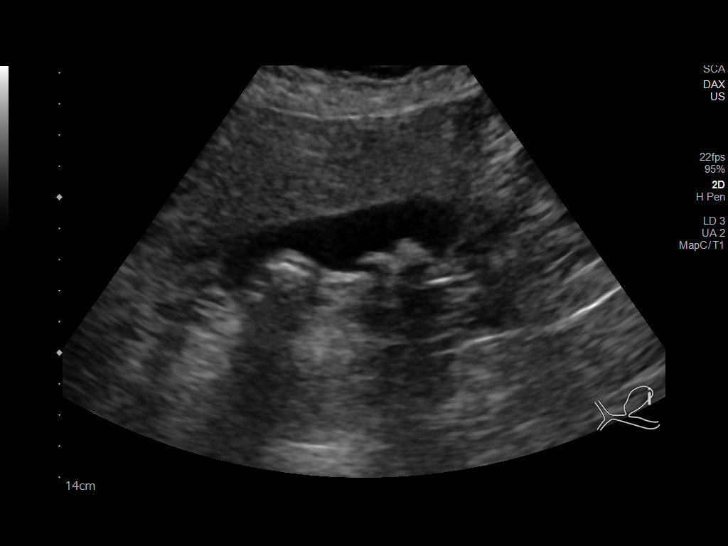
[im 7/73]
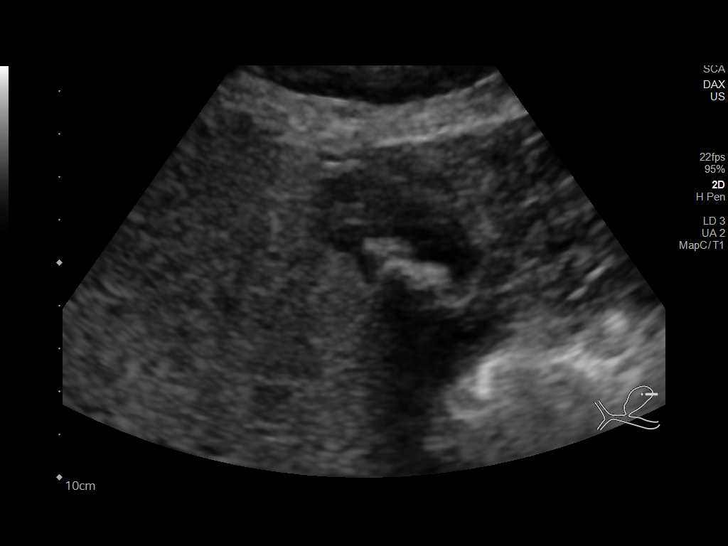
[im 13/73]
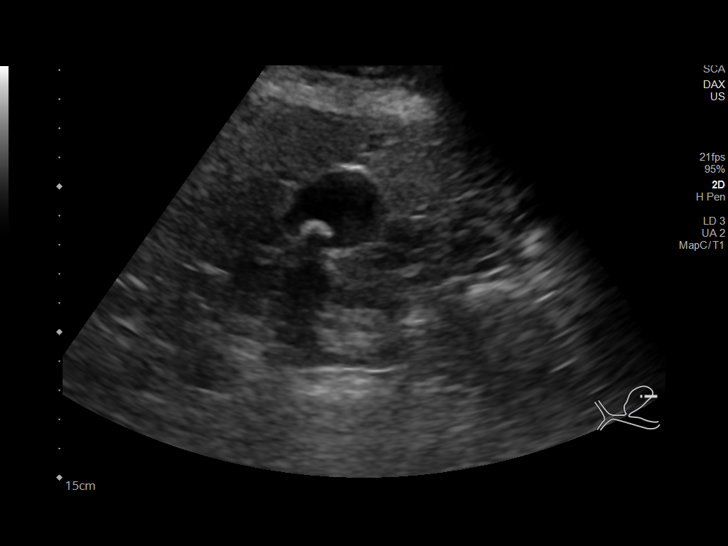
[im 19/73]
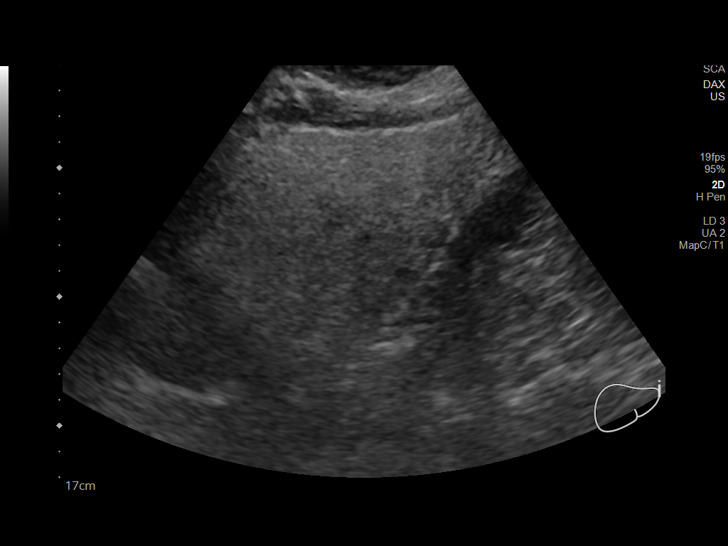
[im 25/73]
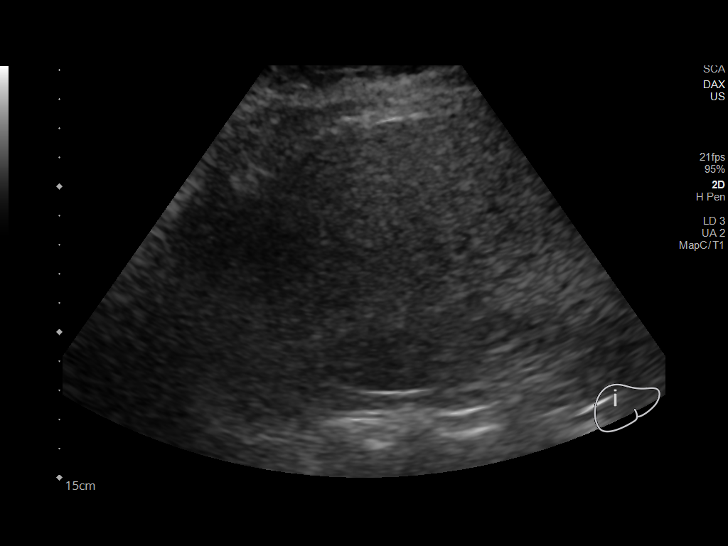
[im 28/73]
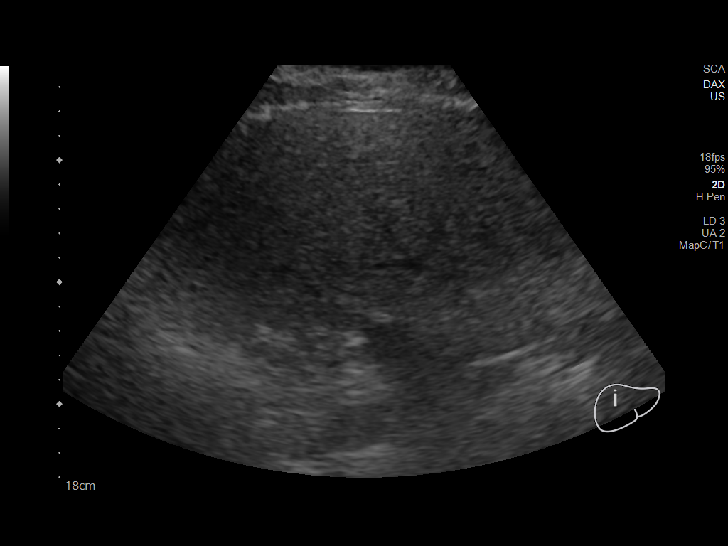
[im 34/73]
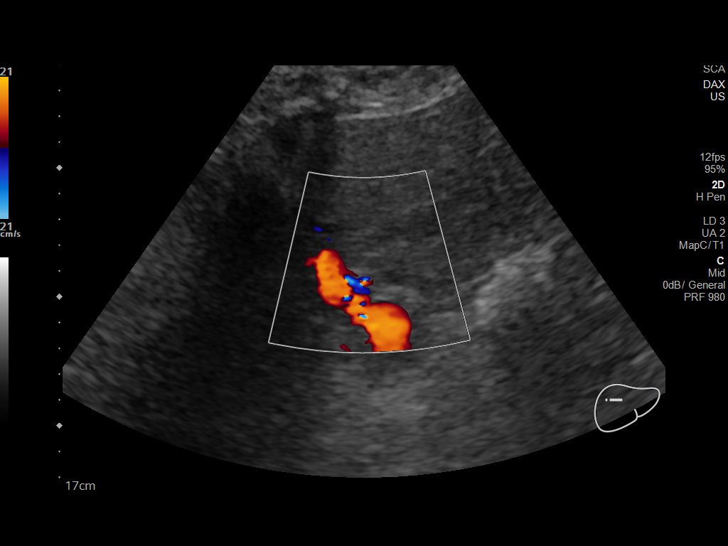
[im 40/73]
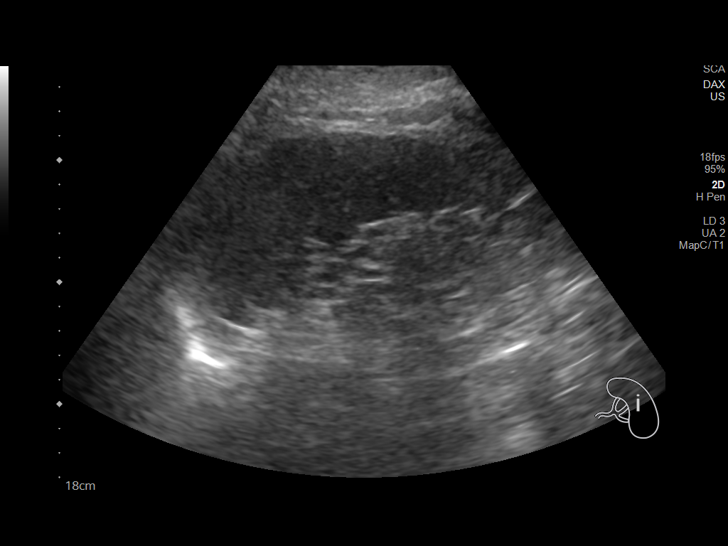
[im 46/73]
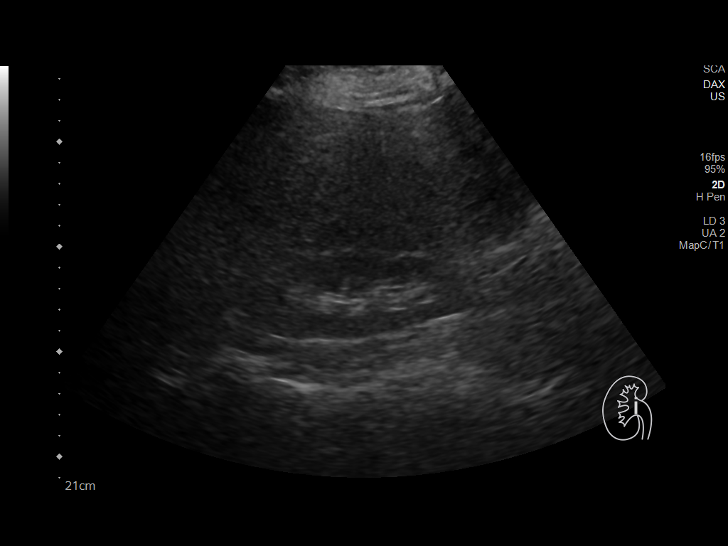
[im 49/73]
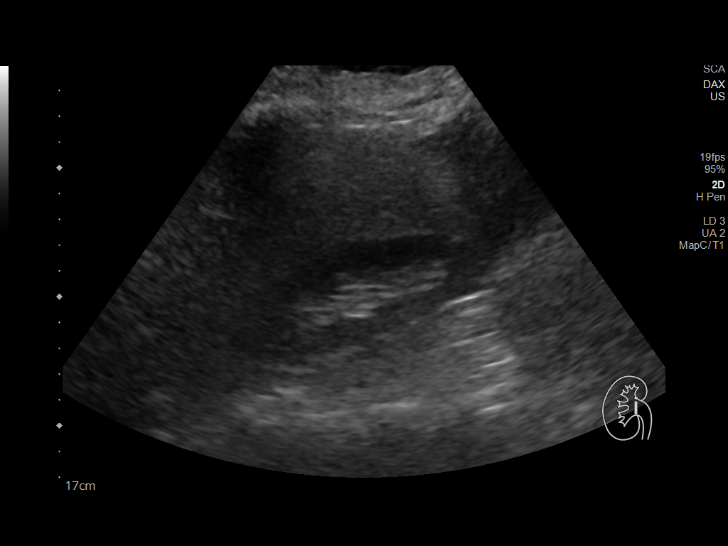
[im 55/73]
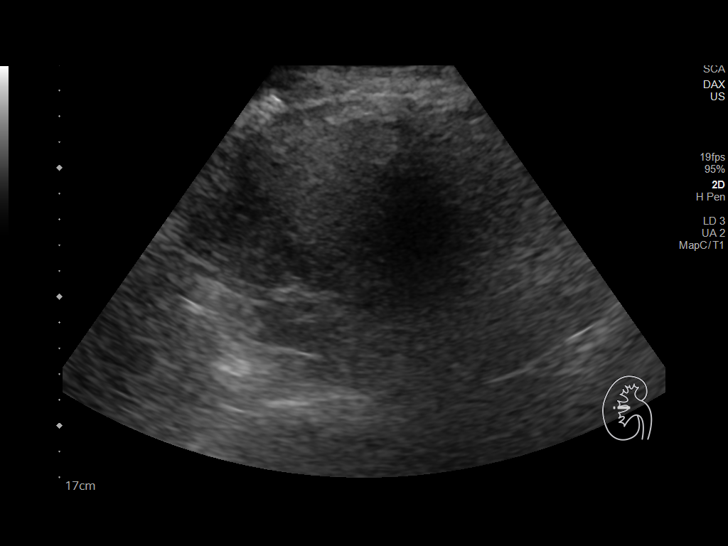
[im 61/73]
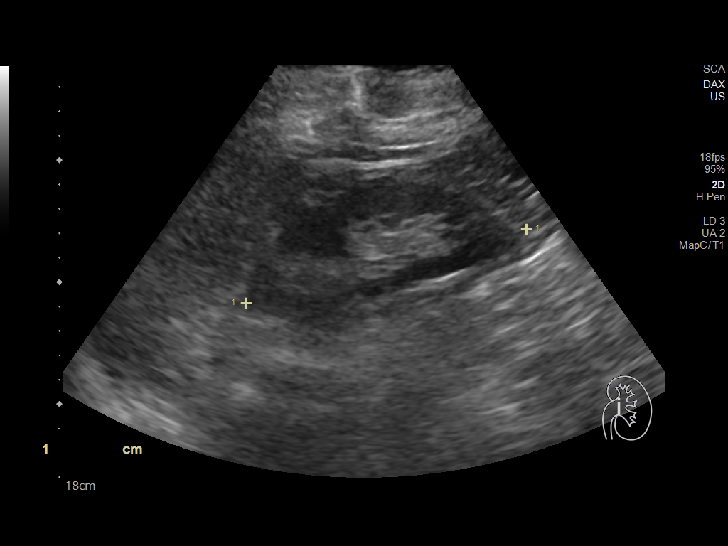
[im 67/73]
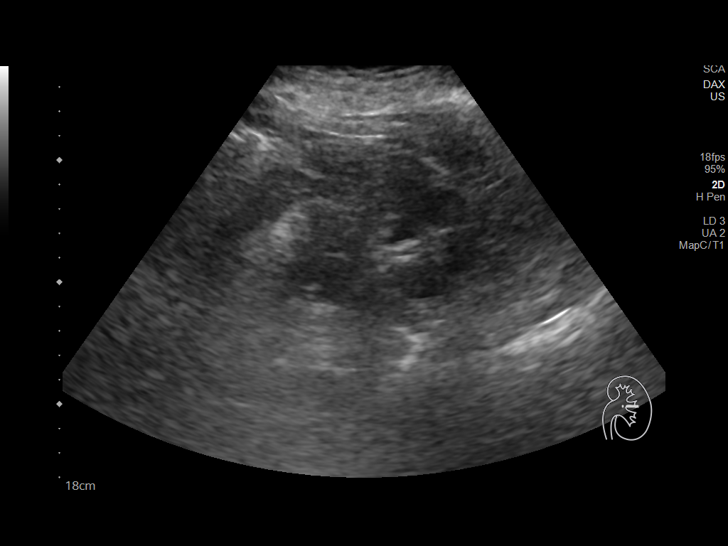
[im 73/73]
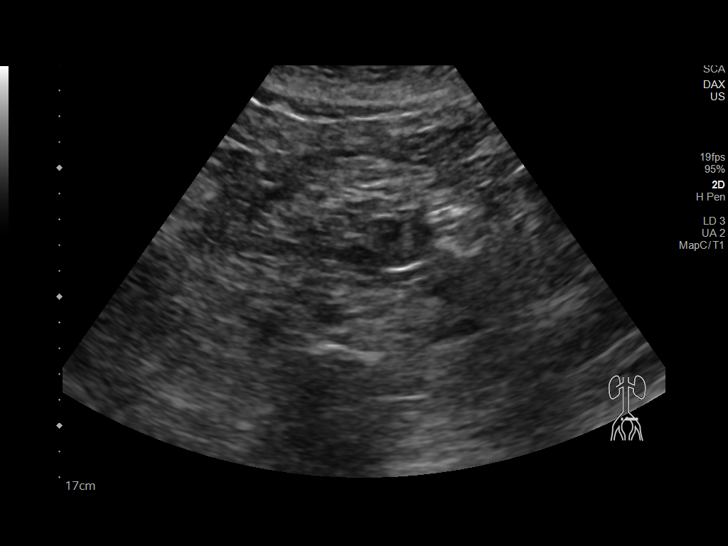

[14 of 25 positions shown; findings below may reference images not displayed]

FINDINGS: Gallbladder: Cholelithiasis measuring up to 1.4 cm. No
pericholecystic fluid or gallbladder wall thickening visualized. No
sonographic Murphy sign noted by sonographer.

Common bile duct: Diameter: 5 mm

Liver: No focal lesion identified. Diffusely increased parenchymal
echogenicity. Portal vein is patent on color Doppler imaging with
normal direction of blood flow towards the liver.

IVC: No abnormality visualized.

Pancreas: Visualized portion unremarkable.

Spleen: Size and appearance within normal limits.

Right Kidney: Length: 11.1 cm. Echogenicity within normal limits. No
mass or hydronephrosis visualized.

Left Kidney: Length: 11.9 cm. Echogenicity within normal limits. No
mass or hydronephrosis visualized.

Abdominal aorta: No aneurysm visualized.

Other findings: None.
IMPRESSION: 1. Cholelithiasis without sonographic evidence of acute
cholecystitis.
2. The echogenicity of the liver is increased. This is a nonspecific
finding but is most commonly seen with fatty infiltration of the
liver. There are no overt focal liver lesions visualized.

## 2023-01-23 ENCOUNTER — Other Ambulatory Visit: Payer: Self-pay | Admitting: Family

## 2023-01-24 ENCOUNTER — Other Ambulatory Visit: Payer: Self-pay | Admitting: Family

## 2023-01-24 MED ORDER — GABAPENTIN 300 MG PO CAPS: 300.0000 mg | ORAL_CAPSULE | Freq: Two times a day (BID) | ORAL | 1 refills | Status: DC

## 2023-01-26 ENCOUNTER — Encounter: Payer: Self-pay | Admitting: Family

## 2023-01-28 ENCOUNTER — Other Ambulatory Visit: Payer: Self-pay | Admitting: Family

## 2023-01-28 MED ORDER — EMPAGLIFLOZIN 10 MG PO TABS: 10.0000 mg | ORAL_TABLET | Freq: Every day | ORAL | 1 refills | Status: DC

## 2023-04-11 ENCOUNTER — Encounter: Payer: Self-pay | Admitting: *Deleted

## 2023-04-14 NOTE — Progress Notes (Unsigned)
PATIENT: Tricia Dodson DOB: 11-27-1950  REASON FOR VISIT: follow up HISTORY FROM: patient PRIMARY NEUROLOGIST: Dr. Vickey Huger  No chief complaint on file.    HISTORY OF PRESENT ILLNESS: Today 04/14/23:  Tricia Dodson is a 72 y.o. female with a history of OSA on CPAP. Returns today for follow-up.      04/12/22: Tricia Dodson is a 72 year old female with a history of OSA on CPAP.  She brought in her card today but unfortunately cannot get a download.  She states CPAP is Better with pressure changes. Uses machine nightly. When she travels she takes a smaller machine and that would explain gaps in usage. Lately a little more sleepy but doesn't think its related to CPAP but insomnia. Retired but will be starting a job with [employer].   04/11/21: Tricia Dodson is a 72 year old female with a history of obstructive sleep apnea on CPAP.  Her download indicates that she use her machine 29 out of 30 days for compliance of 97%.  She use her machine greater than 4 hours each night.  On average she uses her machine 8 hours and 7 minutes.  Her residual AHI is 1.1 on 5-17 cmH2O. she does not have a significant leak.  She states that when she puts her mask on she feels that it does not give her complete air before it stops.  She states that this lasted for about 10 to 15 minutes and then the machine works fine.  HISTORY (Copied from Dr.Dohmeier's note)  Tricia Dodson is a 25 - year- old Caucasian female patient seen here upon FNP referral on 11/30/2020 from NP Dayton Scrape, her primary care provider.   Chief concern according to patient : see above     Tricia Dodson  has a past medical history of Allergy, Arthritis, Chicken pox, Colon polyps, GERD (gastroesophageal reflux disease), HA (headache), Hyperlipidemia, Hypertension, Thyroid disease, and UTI (urinary tract infection)..   The patient had the first sleep study in the year 2005  with a result of OSA being found. She is on  her third CPAP, 72 years old.  She had surgery for her foot and her anesthetist asked her if she knew she had apnea, she did not. A year later she was tested. .    Sleep relevant medical history: Nocturia-none since CPAP-insomnia  In some nights. Obesity, sinus rhinitis-  Goiter.    Family medical /sleep history:Brother  on CPAP with OSA.    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  and lives in a household alone. No children. Pets are Present. 3 cats. 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  She had insomnia often at that time.  Tobacco use; none .   ETOH use; socially , Caffeine intake in form of Coffee( 1 cup in AM) Tea (not and iced -  3 or more a day) . Regular exercise in form of walking. Has knee pain.           Sleep habits are as follows: The patient's dinner time is between 6 PM. The patient goes to bed at 11-12 PM and continues to sleep for intervals of 3 hours, wakes for 30 minutes, reads in book, not bathroom breaks.   The preferred sleep position is laterally , with the support of one pillow.GERD in the past, not currently.  Dreams are reportedly infrequent. 8  AM is the usual rise time.  The patient wakes up spontaneously.  She reports not feeling refreshed or restored in AM if not using CPAP-, but on CPAP she re rarely has  symptoms such as dry mouth , morning headaches , congestion- and residual fatigue.  Naps are taken rarely lasting from 15-30 minutes .      Reviews of CPAP download_  Tricia Dodson has used her CPAP compliantly for the last 30 days 29 out of 30 days with a compliance of over 4 hours for 90% of the time.  Average user time is 8 hours 28 minutes.  Her air sense 10 AutoSet which reportedly is about 60 years old or older as a serial #[id number] 06/[birth year]/1966 1.  It is on factory settings and was never tailored to her needs a minimum pressure is 4 maximum pressure is 20 EPR level is  3 cmH2O.  She has achieved a AHI residual of only 1.4 apneas and hypopneas per hour of use with these being obstructive in origin.  Air leaks are moderate to mild at the 95th percentile 11.3 L/min.  The pressure at the 95th percentile is only 8.9 cmH2O.  She is using a nasal mask , N 20.     REVIEW OF SYSTEMS: Out of a complete 14 system review of symptoms, the patient complains only of the following symptoms, and all other reviewed systems are negative.  ESS 7  ALLERGIES: No Known Allergies  HOME MEDICATIONS: Outpatient Medications Prior to Visit  Medication Sig Dispense Refill   amitriptyline (ELAVIL) 10 MG tablet TAKE 1 TABLET(10 MG) BY MOUTH AT BEDTIME 90 tablet 3   atorvastatin (LIPITOR) 10 MG tablet TAKE 1 TABLET(10 MG) BY MOUTH DAILY 90 tablet 3   benzonatate (TESSALON) 100 MG capsule Take 1 capsule (100 mg total) by mouth 3 (three) times daily as needed. (Patient not taking: Reported on 11/29/2022) 20 capsule 0   DULoxetine (CYMBALTA) 20 MG capsule Take 1 capsule (20 mg total) by mouth daily. 90 capsule 3   empagliflozin (JARDIANCE) 10 MG TABS tablet Take 1 tablet (10 mg total) by mouth daily. 90 tablet 1   fenofibrate micronized (LOFIBRA) 134 MG capsule TAKE 1 CAPSULE(134 MG) BY MOUTH DAILY BEFORE BREAKFAST 90 capsule 1   gabapentin (NEURONTIN) 300 MG capsule Take 1 capsule (300 mg total) by mouth 2 (two) times daily. 180 capsule 1   ipratropium (ATROVENT) 0.03 % nasal spray Place 2 sprays into Dodson nostrils every 12 (twelve) hours. 30 mL 0   levocetirizine (XYZAL) 5 MG tablet TAKE 1 TABLET(5 MG) BY MOUTH DAILY 90 tablet 3   levothyroxine (SYNTHROID) 25 MCG tablet TAKE 1 TABLET(25 MCG) BY MOUTH DAILY 90 tablet 3   losartan (COZAAR) 50 MG tablet TAKE 1 TABLET(50 MG) BY MOUTH DAILY 90 tablet 3   nystatin cream (MYCOSTATIN) Apply 1 Application topically 2 (two) times daily. 30 g 0   omeprazole (PRILOSEC) 40 MG capsule TAKE 1 CAPSULE(40 MG) BY MOUTH TWICE DAILY 180 capsule 3    triamcinolone cream (KENALOG) 0.1 % Apply 1 Application topically 2 (two) times daily. 60 g 2   No facility-administered medications prior to visit.    PAST MEDICAL HISTORY: Past Medical History:  Diagnosis Date   Allergy    Arthritis    Carpal tunnel syndrome, bilateral 11/04/2020   Dodson wrists.    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   CPAP use counseling 11/04/2020   GERD (gastroesophageal reflux disease)  HA (headache)    History of vein stripping 11/04/2020   Right leg   Hyperlipidemia    Hypertension    Morbid obesity (HCC) 11/30/2020   OSA on CPAP 11/30/2020   Plantar fasciitis, bilateral 11/04/2020   With Bone spors   Retrognathia 11/30/2020   Shingles 2019   Thyroid disease    Torn ligament 11/04/2020   Right knee   UTI (urinary tract infection)     PAST SURGICAL HISTORY: Past Surgical History:  Procedure Laterality Date   BILATERAL CARPAL TUNNEL RELEASE     Hemorroids removed     KNEE ARTHROSCOPY Right    Plantar fasciatis      Repaired   right hand cyst removal     vein vascular     surgery    FAMILY HISTORY: Family History  Problem Relation Age of Onset   Arthritis Mother    Diabetes Father    Heart disease Father    Arthritis Sister    Diabetes Sister    Hyperlipidemia Sister    Hyperlipidemia Brother    Heart disease Brother        triple heart bypass   Sleep apnea Brother     SOCIAL HISTORY: Social History   Socioeconomic History   Marital status: Single    Spouse name: Not on file   Number of children: Not on file   Years of education: Not on file   Highest education level: Bachelor's degree (e.g., BA, AB, BS)  Occupational History   Not on file  Tobacco Use   Smoking status: Former   Smokeless tobacco: Never  Vaping Use   Vaping status: Never Used  Substance and Sexual Activity   Alcohol use: Yes    Comment: occ   Drug use: Not on file   Sexual activity: Not Currently   Other Topics Concern   Not on file  Social History Narrative   Not on file   Social Determinants of Health   Financial Resource Strain: Low Risk  (11/20/2022)   Overall Financial Resource Strain (CARDIA)    Difficulty of Paying Living Expenses: Not very hard  Food Insecurity: No Food Insecurity (11/20/2022)   Hunger Vital Sign    Worried About Running Out of Food in the Last Year: Never true    Ran Out of Food in the Last Year: Never true  Transportation Needs: No Transportation Needs (11/20/2022)   PRAPARE - Administrator, Civil Service (Medical): No    Lack of Transportation (Non-Medical): No  Physical Activity: Insufficiently Active (11/20/2022)   Exercise Vital Sign    Days of Exercise per Week: 1 day    Minutes of Exercise per Session: 30 min  Stress: No Stress Concern Present (11/20/2022)   [employer] of Occupational Health - Occupational Stress Questionnaire    Feeling of Stress : Only a little  Social Connections: Moderately Integrated (11/20/2022)   Social Connection and Isolation Panel [NHANES]    Frequency of Communication with Friends and Family: Three times a week    Frequency of Social Gatherings with Friends and Family: Three times a week    Attends Religious Services: More than 4 times per year    Active Member of Clubs or Organizations: Yes    Attends Banker Meetings: More than 4 times per year    Marital Status: Never married  Intimate Partner Violence: Not on file      PHYSICAL EXAM  There were no vitals filed for this  visit.   There is no height or weight on file to calculate BMI.  Generalized: Well developed, in no acute distress  Chest: Lungs clear to auscultation bilaterally  Neurological examination  Mentation: Alert oriented to time, place, history taking. Follows all commands speech and language fluent Cranial nerve II-XII: Extraocular movements were full, visual field were full on confrontational test Head  turning and shoulder shrug  were normal and symmetric.  Gait and station: Gait is normal.    DIAGNOSTIC DATA (LABS, IMAGING, TESTING) - I reviewed patient records, labs, notes, testing and imaging myself where available.  Lab Results  Component Value Date   WBC 7.6 11/29/2022   HGB 14.5 11/29/2022   HCT 44.6 11/29/2022   MCV 85.8 11/29/2022   PLT 352.0 11/29/2022      Component Value Date/Time   NA 142 11/29/2022 0847   K 4.6 11/29/2022 0847   CL 105 11/29/2022 0847   CO2 30 11/29/2022 0847   GLUCOSE 146 (H) 11/29/2022 0847   BUN 18 11/29/2022 0847   CREATININE 1.15 11/29/2022 0847   CALCIUM 9.7 11/29/2022 0847   PROT 6.5 11/29/2022 0847   ALBUMIN 4.1 11/29/2022 0847   AST 23 11/29/2022 0847   ALT 24 11/29/2022 0847   ALKPHOS 65 11/29/2022 0847   BILITOT 0.4 11/29/2022 0847   Lab Results  Component Value Date   CHOL 146 11/29/2022   HDL 44.50 11/29/2022   LDLCALC 68 11/29/2022   LDLDIRECT 84.0 01/30/2022   TRIG 164.0 (H) 11/29/2022   CHOLHDL 3 11/29/2022   Lab Results  Component Value Date   HGBA1C 7.0 (H) 11/29/2022   No results found for: "VITAMINB12" Lab Results  Component Value Date   TSH 3.07 11/29/2022      ASSESSMENT AND PLAN 72 y.o. year old female  has a past medical history of Allergy, Arthritis, Carpal tunnel syndrome, bilateral (11/04/2020), Chicken pox, Colon polyps, CPAP (continuous positive airway pressure) dependence (11/30/2020), CPAP use counseling (11/04/2020), GERD (gastroesophageal reflux disease), HA (headache), History of vein stripping (11/04/2020), Hyperlipidemia, Hypertension, Morbid obesity (HCC) (11/30/2020), OSA on CPAP (11/30/2020), Plantar fasciitis, bilateral (11/04/2020), Retrognathia (11/30/2020), Shingles (2019), Thyroid disease, Torn ligament (11/04/2020), and UTI (urinary tract infection). here with:  OSA on CPAP  - Patient will bring her machine by her office for download - Encourage patient to use CPAP nightly and > 4 hours each  night - F/U in 1 year or sooner if needed   Butch Penny, MSN, NP-C 04/14/2023, 1:27 PM [facility] [address], [address] [place], [place] [address] [phone]

## 2023-04-15 ENCOUNTER — Ambulatory Visit (INDEPENDENT_AMBULATORY_CARE_PROVIDER_SITE_OTHER): Payer: Medicare Other | Admitting: Adult Health

## 2023-04-15 ENCOUNTER — Encounter: Payer: Self-pay | Admitting: Adult Health

## 2023-04-15 VITALS — BP 123/79 | HR 69 | Ht 67.0 in | Wt 265.0 lb

## 2023-04-15 DIAGNOSIS — G4733 Obstructive sleep apnea (adult) (pediatric): Secondary | ICD-10-CM | POA: Diagnosis not present

## 2023-04-15 NOTE — Patient Instructions (Signed)
Continue using CPAP nightly and greater than 4 hours each night °If your symptoms worsen or you develop new symptoms please let us know.  ° °

## 2023-05-01 ENCOUNTER — Other Ambulatory Visit: Payer: Self-pay | Admitting: Family

## 2023-05-02 ENCOUNTER — Other Ambulatory Visit: Payer: Self-pay | Admitting: Family

## 2023-06-05 DIAGNOSIS — H401231 Low-tension glaucoma, bilateral, mild stage: Secondary | ICD-10-CM | POA: Diagnosis not present

## 2023-06-05 DIAGNOSIS — H02834 Dermatochalasis of left upper eyelid: Secondary | ICD-10-CM | POA: Diagnosis not present

## 2023-06-14 ENCOUNTER — Other Ambulatory Visit: Payer: Self-pay | Admitting: Medical Genetics

## 2023-06-18 ENCOUNTER — Ambulatory Visit: Payer: Medicare Other | Admitting: Family

## 2023-06-18 ENCOUNTER — Encounter: Payer: Self-pay | Admitting: Family

## 2023-06-18 VITALS — BP 118/68 | HR 77 | Resp 17 | Ht 67.0 in | Wt 259.8 lb

## 2023-06-18 DIAGNOSIS — E119 Type 2 diabetes mellitus without complications: Secondary | ICD-10-CM

## 2023-06-18 DIAGNOSIS — Z23 Encounter for immunization: Secondary | ICD-10-CM | POA: Diagnosis not present

## 2023-06-18 DIAGNOSIS — E079 Disorder of thyroid, unspecified: Secondary | ICD-10-CM

## 2023-06-18 DIAGNOSIS — Z13220 Encounter for screening for lipoid disorders: Secondary | ICD-10-CM | POA: Diagnosis not present

## 2023-06-18 DIAGNOSIS — S6991XA Unspecified injury of right wrist, hand and finger(s), initial encounter: Secondary | ICD-10-CM

## 2023-06-18 DIAGNOSIS — Z0000 Encounter for general adult medical examination without abnormal findings: Secondary | ICD-10-CM | POA: Diagnosis not present

## 2023-06-18 LAB — CBC WITH DIFFERENTIAL/PLATELET
Basophils Absolute: 0 10*3/uL (ref 0.0–0.1)
Basophils Relative: 0.6 % (ref 0.0–3.0)
Eosinophils Absolute: 0.1 10*3/uL (ref 0.0–0.7)
Eosinophils Relative: 2 % (ref 0.0–5.0)
HCT: 42 % (ref 36.0–46.0)
Hemoglobin: 13.7 g/dL (ref 12.0–15.0)
Lymphocytes Relative: 25 % (ref 12.0–46.0)
Lymphs Abs: 1.7 10*3/uL (ref 0.7–4.0)
MCHC: 32.7 g/dL (ref 30.0–36.0)
MCV: 86.3 fL (ref 78.0–100.0)
Monocytes Absolute: 0.3 10*3/uL (ref 0.1–1.0)
Monocytes Relative: 4.8 % (ref 3.0–12.0)
Neutro Abs: 4.7 10*3/uL (ref 1.4–7.7)
Neutrophils Relative %: 67.6 % (ref 43.0–77.0)
Platelets: 322 10*3/uL (ref 150.0–400.0)
RBC: 4.86 Mil/uL (ref 3.87–5.11)
RDW: 13.5 % (ref 11.5–15.5)
WBC: 6.9 10*3/uL (ref 4.0–10.5)

## 2023-06-18 LAB — COMPREHENSIVE METABOLIC PANEL
ALT: 21 U/L (ref 0–35)
AST: 19 U/L (ref 0–37)
Albumin: 4.1 g/dL (ref 3.5–5.2)
Alkaline Phosphatase: 74 U/L (ref 39–117)
BUN: 11 mg/dL (ref 6–23)
CO2: 31 meq/L (ref 19–32)
Calcium: 9.2 mg/dL (ref 8.4–10.5)
Chloride: 105 meq/L (ref 96–112)
Creatinine, Ser: 1.06 mg/dL (ref 0.40–1.20)
GFR: 52.54 mL/min — ABNORMAL LOW (ref >60.00)
Glucose, Bld: 160 mg/dL — ABNORMAL HIGH (ref 70–99)
Potassium: 4.5 meq/L (ref 3.5–5.1)
Sodium: 143 meq/L (ref 135–145)
Total Bilirubin: 0.5 mg/dL (ref 0.2–1.2)
Total Protein: 6.4 g/dL (ref 6.0–8.3)

## 2023-06-18 LAB — MICROALBUMIN / CREATININE URINE RATIO
Creatinine,U: 50.3 mg/dL
Microalb Creat Ratio: 4.6 mg/g (ref 0.0–30.0)
Microalb, Ur: 2.3 mg/dL — ABNORMAL HIGH (ref 0.0–1.9)

## 2023-06-18 LAB — LIPID PANEL
Cholesterol: 128 mg/dL (ref 0–200)
HDL: 40.2 mg/dL (ref >39.00)
LDL Cholesterol: 55 mg/dL (ref 0–99)
NonHDL: 87.85
Total CHOL/HDL Ratio: 3
Triglycerides: 162 mg/dL — ABNORMAL HIGH (ref 0.0–149.0)
VLDL: 32.4 mg/dL (ref 0.0–40.0)

## 2023-06-18 LAB — TSH: TSH: 1.49 u[IU]/mL (ref 0.35–5.50)

## 2023-06-18 LAB — HEMOGLOBIN A1C: Hgb A1c MFr Bld: 7.3 % — ABNORMAL HIGH (ref 4.6–6.5)

## 2023-06-18 MED ORDER — EMPAGLIFLOZIN 10 MG PO TABS: 10.0000 mg | ORAL_TABLET | Freq: Every day | ORAL | 1 refills | Status: DC

## 2023-06-18 MED ORDER — AMITRIPTYLINE HCL 10 MG PO TABS: 10.0000 mg | ORAL_TABLET | Freq: Every day | ORAL | 3 refills | Status: DC

## 2023-06-18 MED ORDER — GABAPENTIN 300 MG PO CAPS: 300.0000 mg | ORAL_CAPSULE | Freq: Two times a day (BID) | ORAL | 1 refills | Status: DC

## 2023-06-18 MED ORDER — LEVOTHYROXINE SODIUM 25 MCG PO TABS: ORAL_TABLET | ORAL | 3 refills | Status: DC

## 2023-06-18 MED ORDER — LEVOCETIRIZINE DIHYDROCHLORIDE 5 MG PO TABS: 5.0000 mg | ORAL_TABLET | Freq: Every evening | ORAL | 3 refills | Status: DC

## 2023-06-18 MED ORDER — FENOFIBRATE 134 MG PO CAPS: 134.0000 mg | ORAL_CAPSULE | Freq: Every day | ORAL | 3 refills | Status: DC

## 2023-06-18 MED ORDER — DULOXETINE HCL 20 MG PO CPEP: 20.0000 mg | ORAL_CAPSULE | Freq: Every day | ORAL | 0 refills | Status: DC

## 2023-06-18 MED ORDER — ATORVASTATIN CALCIUM 10 MG PO TABS: 10.0000 mg | ORAL_TABLET | Freq: Every day | ORAL | 3 refills | Status: DC

## 2023-06-18 NOTE — Patient Instructions (Addendum)
Your to do list:  Please call and schedule follow up with GYN;  Will plan on mammogram in 2025; can also plan to do your bone density at that time;

## 2023-06-18 NOTE — Progress Notes (Signed)
Tricia Dodson is a 72 y.o. female with the following history as recorded in [employer]:  Patient Active Problem List   Diagnosis Date Noted   Primary osteoarthritis of both knees 04/18/2021   Dizziness 12/02/2020   Nonspecific abnormal electrocardiogram (ECG) (EKG) 12/02/2020   UTI (urinary tract infection)    Thyroid disease    Hypertension    Hyperlipidemia    HA (headache)    GERD (gastroesophageal reflux disease)    Colon polyps    Chicken pox    Arthritis    Allergy    Morbid obesity (HCC) 11/30/2020   Retrognathia 11/30/2020   CPAP (continuous positive airway pressure) dependence 11/30/2020   OSA on CPAP 11/30/2020   CPAP use counseling 11/04/2020   Carpal tunnel syndrome, bilateral 11/04/2020   Plantar fasciitis, bilateral 11/04/2020   Torn ligament 11/04/2020   History of vein stripping 11/04/2020   Shingles 2019    Current Outpatient Medications  Medication Sig Dispense Refill   ipratropium (ATROVENT) 0.03 % nasal spray Place 2 sprays into both nostrils every 12 (twelve) hours. 30 mL 0   nystatin cream (MYCOSTATIN) Apply 1 Application topically 2 (two) times daily. (Patient taking differently: Apply 1 Application topically as needed.) 30 g 0   omeprazole (PRILOSEC) 40 MG capsule TAKE 1 CAPSULE(40 MG) BY MOUTH TWICE DAILY 180 capsule 3   triamcinolone cream (KENALOG) 0.1 % Apply 1 Application topically 2 (two) times daily. (Patient taking differently: Apply 1 Application topically as needed.) 60 g 2   amitriptyline (ELAVIL) 10 MG tablet Take 1 tablet (10 mg total) by mouth at bedtime. 90 tablet 3   atorvastatin (LIPITOR) 10 MG tablet Take 1 tablet (10 mg total) by mouth daily. 90 tablet 3   DULoxetine (CYMBALTA) 20 MG capsule Take 1 capsule (20 mg total) by mouth daily. 90 capsule 0   empagliflozin (JARDIANCE) 10 MG TABS tablet Take 1 tablet (10 mg total) by mouth daily. 90 tablet 1   fenofibrate micronized (LOFIBRA) 134 MG capsule Take 1 capsule (134 mg total) by  mouth daily before breakfast. 90 capsule 3   gabapentin (NEURONTIN) 300 MG capsule Take 1 capsule (300 mg total) by mouth 2 (two) times daily. 180 capsule 1   levocetirizine (XYZAL) 5 MG tablet Take 1 tablet (5 mg total) by mouth every evening. 90 tablet 3   levothyroxine (SYNTHROID) 25 MCG tablet TAKE 1 TABLET(25 MCG) BY MOUTH DAILY 90 tablet 3   No current facility-administered medications for this visit.    Allergies: Patient has no known allergies.  Past Medical History:  Diagnosis Date   Allergy    Arthritis    Carpal tunnel syndrome, bilateral 11/04/2020   Both wrists.    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   Shingles 2019   Thyroid disease    Torn ligament 11/04/2020   Right knee   UTI (urinary tract infection)     Past Surgical History:  Procedure Laterality Date   BILATERAL CARPAL TUNNEL RELEASE     Hemorroids removed     KNEE ARTHROSCOPY Right  Plantar fasciatis      Repaired   right hand cyst removal     vein vascular     surgery    Family History  Problem Relation Age of Onset   Arthritis Mother    Diabetes Father    Heart disease Father    Arthritis Sister    Diabetes Sister    Hyperlipidemia Sister    Hyperlipidemia Brother    Heart disease Brother        triple heart bypass   Sleep apnea Brother     Social History   Tobacco Use   Smoking status: Former   Smokeless tobacco: Never  Substance Use Topics   Alcohol use: Yes    Comment: occ    Subjective:   Patient presents for yearly CPE; does need labs and refills updated; Prefers to do mammogram every 2 years; due  again in October 2025;  Is under care of ophthalmology- being evaluated as glaucoma suspect;   Did cut her 3rd finger on mandolin 2 days ago- unsure of date of last Tdap;   Review of Systems  Constitutional: Negative.   HENT: Negative.    Eyes: Negative.   Respiratory: Negative.    Cardiovascular: Negative.   Gastrointestinal: Negative.   Genitourinary: Negative.   Musculoskeletal: Negative.   Skin: Negative.   Neurological: Negative.   Endo/Heme/Allergies: Negative.   Psychiatric/Behavioral: Negative.       Objective:  Vitals:   06/18/23 1015  BP: 118/68  Pulse: 77  Resp: 17  SpO2: 94%  Weight: 259 lb 12.8 oz (117.8 kg)  Height: 5\' 7"  (1.702 m)    General: Well developed, well nourished, in no acute distress  Skin : Warm and dry.  Head: Normocephalic and atraumatic  Eyes: Sclera and conjunctiva clear; pupils round and reactive to light; extraocular movements intact  Ears: External normal; canals clear; tympanic membranes normal  Oropharynx: Pink, supple. No suspicious lesions  Neck: Supple without thyromegaly, adenopathy  Lungs: Respirations unlabored; clear to auscultation bilaterally without wheeze, rales, rhonchi  CVS exam: normal rate and regular rhythm.  Abdomen: Soft; nontender; nondistended; normoactive bowel sounds; no masses or hepatosplenomegaly  Musculoskeletal: No deformities; no active joint inflammation  Extremities: No edema, cyanosis, clubbing  Vessels: Symmetric bilaterally  Neurologic: Alert and oriented; speech intact; face symmetrical; moves all extremities well; CNII-XII intact without focal deficit   Assessment:  1. PE (physical exam), annual   2. Lipid screening   3. Type 2 diabetes mellitus without complication, without long-term current use of insulin (HCC)   4. Thyroid disease   5. Injury of finger of right hand, initial encounter   6. Need for pneumococcal 20-valent conjugate vaccination     Plan:  Age appropriate preventive  healthcare needs addressed; encouraged regular eye doctor and dental exams; encouraged regular exercise; will update labs and refills as needed today; follow-up to be determined; Wound on finger does not show any signs of infection; encouraged to keep area dry/ covered as she is already doing; Tdap updated;   No follow-ups on file.  Orders Placed This Encounter  Procedures   Pneumococcal conjugate vaccine 20-valent (Prevnar 20)   Tdap vaccine greater than or equal to 7yo IM   CBC with Differential/Platelet   Comp Met (CMET)   Lipid panel   Hemoglobin A1c   Urine Microalbumin w/creat. ratio   TSH    Requested Prescriptions   Signed Prescriptions Disp Refills   amitriptyline (ELAVIL) 10 MG tablet 90 tablet 3  Sig: Take 1 tablet (10 mg total) by mouth at bedtime.   atorvastatin (LIPITOR) 10 MG tablet 90 tablet 3    Sig: Take 1 tablet (10 mg total) by mouth daily.   DULoxetine (CYMBALTA) 20 MG capsule 90 capsule 0    Sig: Take 1 capsule (20 mg total) by mouth daily.   empagliflozin (JARDIANCE) 10 MG TABS tablet 90 tablet 1    Sig: Take 1 tablet (10 mg total) by mouth daily.   fenofibrate micronized (LOFIBRA) 134 MG capsule 90 capsule 3    Sig: Take 1 capsule (134 mg total) by mouth daily before breakfast.   gabapentin (NEURONTIN) 300 MG capsule 180 capsule 1    Sig: Take 1 capsule (300 mg total) by mouth 2 (two) times daily.   levocetirizine (XYZAL) 5 MG tablet 90 tablet 3    Sig: Take 1 tablet (5 mg total) by mouth every evening.   levothyroxine (SYNTHROID) 25 MCG tablet 90 tablet 3    Sig: TAKE 1 TABLET(25 MCG) BY MOUTH DAILY

## 2023-07-08 ENCOUNTER — Telehealth: Payer: Medicare Other | Admitting: Physician Assistant

## 2023-07-08 DIAGNOSIS — M549 Dorsalgia, unspecified: Secondary | ICD-10-CM

## 2023-07-08 MED ORDER — CYCLOBENZAPRINE HCL 10 MG PO TABS: 5.0000 mg | ORAL_TABLET | Freq: Three times a day (TID) | ORAL | 0 refills | Status: DC | PRN

## 2023-07-08 MED ORDER — NAPROXEN 500 MG PO TABS: 500.0000 mg | ORAL_TABLET | Freq: Two times a day (BID) | ORAL | 0 refills | Status: DC

## 2023-07-08 NOTE — Progress Notes (Signed)

## 2023-07-11 ENCOUNTER — Encounter: Payer: Self-pay | Admitting: Family

## 2023-07-11 ENCOUNTER — Ambulatory Visit (INDEPENDENT_AMBULATORY_CARE_PROVIDER_SITE_OTHER): Payer: Medicare Other | Admitting: Family

## 2023-07-11 ENCOUNTER — Ambulatory Visit (HOSPITAL_BASED_OUTPATIENT_CLINIC_OR_DEPARTMENT_OTHER)
Admission: RE | Admit: 2023-07-11 | Discharge: 2023-07-11 | Disposition: A | Discharge status: Home / Self Care | Payer: Medicare Other | Source: Ambulatory Visit | Attending: Family | Admitting: Family

## 2023-07-11 VITALS — BP 118/80 | HR 75 | Ht 67.0 in | Wt 252.9 lb

## 2023-07-11 DIAGNOSIS — R0789 Other chest pain: Secondary | ICD-10-CM

## 2023-07-11 DIAGNOSIS — M546 Pain in thoracic spine: Secondary | ICD-10-CM | POA: Diagnosis not present

## 2023-07-11 DIAGNOSIS — R109 Unspecified abdominal pain: Secondary | ICD-10-CM | POA: Diagnosis not present

## 2023-07-11 DIAGNOSIS — R079 Chest pain, unspecified: Secondary | ICD-10-CM | POA: Diagnosis not present

## 2023-07-11 DIAGNOSIS — M47814 Spondylosis without myelopathy or radiculopathy, thoracic region: Secondary | ICD-10-CM | POA: Diagnosis not present

## 2023-07-11 LAB — CBC WITH DIFFERENTIAL/PLATELET
Basophils Absolute: 0.1 10*3/uL (ref 0.0–0.1)
Basophils Relative: 0.7 % (ref 0.0–3.0)
Eosinophils Absolute: 0.2 10*3/uL (ref 0.0–0.7)
Eosinophils Relative: 2 % (ref 0.0–5.0)
HCT: 43.5 % (ref 36.0–46.0)
Hemoglobin: 14 g/dL (ref 12.0–15.0)
Lymphocytes Relative: 13.5 % (ref 12.0–46.0)
Lymphs Abs: 1.2 10*3/uL (ref 0.7–4.0)
MCHC: 32.2 g/dL (ref 30.0–36.0)
MCV: 86.5 fL (ref 78.0–100.0)
Monocytes Absolute: 0.6 10*3/uL (ref 0.1–1.0)
Monocytes Relative: 6.6 % (ref 3.0–12.0)
Neutro Abs: 7 10*3/uL (ref 1.4–7.7)
Neutrophils Relative %: 77.2 % — ABNORMAL HIGH (ref 43.0–77.0)
Platelets: 335 10*3/uL (ref 150.0–400.0)
RBC: 5.03 Mil/uL (ref 3.87–5.11)
RDW: 13.8 % (ref 11.5–15.5)
WBC: 9.1 10*3/uL (ref 4.0–10.5)

## 2023-07-11 LAB — URINALYSIS
Bilirubin Urine: NEGATIVE
Hgb urine dipstick: NEGATIVE
Nitrite: NEGATIVE
Specific Gravity, Urine: 1.02 (ref 1.000–1.030)
Urine Glucose: >=1000 — AB
Urobilinogen, UA: 1 (ref 0.0–1.0)
pH: 5.5 (ref 5.0–8.0)

## 2023-07-11 LAB — URINALYSIS, ROUTINE W REFLEX MICROSCOPIC
Bilirubin Urine: NEGATIVE
Hgb urine dipstick: NEGATIVE
Nitrite: NEGATIVE
Specific Gravity, Urine: 1.02 (ref 1.000–1.030)
Urine Glucose: >=1000 — AB
Urobilinogen, UA: 1 (ref 0.0–1.0)
pH: 5.5 (ref 5.0–8.0)

## 2023-07-11 LAB — COMPREHENSIVE METABOLIC PANEL
ALT: 42 U/L — ABNORMAL HIGH (ref 0–35)
AST: 35 U/L (ref 0–37)
Albumin: 3.9 g/dL (ref 3.5–5.2)
Alkaline Phosphatase: 88 U/L (ref 39–117)
BUN: 13 mg/dL (ref 6–23)
CO2: 28 meq/L (ref 19–32)
Calcium: 9.2 mg/dL (ref 8.4–10.5)
Chloride: 103 meq/L (ref 96–112)
Creatinine, Ser: 1.03 mg/dL (ref 0.40–1.20)
GFR: 54.35 mL/min — ABNORMAL LOW (ref >60.00)
Glucose, Bld: 129 mg/dL — ABNORMAL HIGH (ref 70–99)
Potassium: 4.3 meq/L (ref 3.5–5.1)
Sodium: 142 meq/L (ref 135–145)
Total Bilirubin: 0.9 mg/dL (ref 0.2–1.2)
Total Protein: 6.4 g/dL (ref 6.0–8.3)

## 2023-07-11 NOTE — Progress Notes (Signed)
 Tricia Dodson is a 73 y.o. female with the following history as recorded in [employer]:  Patient Active Problem List   Diagnosis Date Noted   Primary osteoarthritis of both knees 04/18/2021   Dizziness 12/02/2020   Nonspecific abnormal electrocardiogram (ECG) (EKG) 12/02/2020   UTI (urinary tract infection)    Thyroid  disease    Hypertension    Hyperlipidemia    HA (headache)    GERD (gastroesophageal reflux disease)    Colon polyps    Chicken pox    Arthritis    Allergy    Morbid obesity (HCC) 11/30/2020   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 90 tablet 3   atorvastatin  (LIPITOR) 10 MG tablet Take 1 tablet (10 mg total) by mouth daily. 90 tablet 3   cyclobenzaprine  (FLEXERIL ) 10 MG tablet Take 0.5-1 tablets (5-10 mg total) by mouth 3 (three) times daily as needed. 30 tablet 0   DULoxetine  (CYMBALTA ) 20 MG capsule Take 1 capsule (20 mg total) by mouth daily. 90 capsule 0   empagliflozin  (JARDIANCE ) 10 MG TABS tablet Take 1 tablet (10 mg total) by mouth daily. 90 tablet 1   fenofibrate  micronized (LOFIBRA) 134 MG capsule Take 1 capsule (134 mg total) by mouth daily before breakfast. 90 capsule 3   gabapentin  (NEURONTIN ) 300 MG capsule Take 1 capsule (300 mg total) by mouth 2 (two) times daily. 180 capsule 1   ipratropium (ATROVENT ) 0.03 % nasal spray Place 2 sprays into both nostrils every 12 (twelve) hours. 30 mL 0   levocetirizine (XYZAL ) 5 MG tablet Take 1 tablet (5 mg total) by mouth every evening. 90 tablet 3   levothyroxine  (SYNTHROID ) 25 MCG  tablet TAKE 1 TABLET(25 MCG) BY MOUTH DAILY 90 tablet 3   naproxen  (NAPROSYN ) 500 MG tablet Take 1 tablet (500 mg total) by mouth 2 (two) times daily with a meal. 30 tablet 0   omeprazole  (PRILOSEC) 40 MG capsule TAKE 1 CAPSULE(40 MG) BY MOUTH TWICE DAILY 180 capsule 3   No current facility-administered medications for this visit.    Allergies: Patient has no known allergies.  Past Medical History:  Diagnosis Date   Allergy    Arthritis    Carpal tunnel syndrome, bilateral 11/04/2020   Both wrists.    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   Shingles 2019   Thyroid  disease    Torn ligament 11/04/2020   Right knee   UTI (urinary tract infection)     Past Surgical History:  Procedure Laterality Date   BILATERAL CARPAL TUNNEL RELEASE     Hemorroids removed     KNEE ARTHROSCOPY Right    Plantar  fasciatis      Repaired   right hand cyst removal     vein vascular     surgery    Family History  Problem Relation Age of Onset   Arthritis Mother    Diabetes Father    Heart disease Father    Arthritis Sister    Diabetes Sister    Hyperlipidemia Sister    Hyperlipidemia Brother    Heart disease Brother        triple heart bypass   Sleep apnea Brother     Social History   Tobacco Use   Smoking status: Former   Smokeless tobacco: Never  Substance Use Topics   Alcohol use: Yes    Comment: occ    Subjective:   Started with middle back pain on 12/26- noted to start suddenly and initially pain at night time only; describes as a tight sensation in her chest/ ribs; did e-vist on 12/30 and was prescribed  Naproxen  and Flexeril  which as offered some benefit; no known injury or trauma; No cough/ shortness of breath on exertion; no fever; no changes in bowel movements; No recent travel;      Objective:  Vitals:   07/11/23 0916  BP: 118/80  Pulse: 75  SpO2: 95%  Weight: 252 lb 14.4 oz (114.7 kg)  Height: 5' 7 (1.702 m)    General: Well developed, well nourished, in no acute distress  Skin : Warm and dry.  Head: Normocephalic and atraumatic  Eyes: Sclera and conjunctiva clear; pupils round and reactive to light; extraocular movements intact  Ears: External normal; canals clear; tympanic membranes normal  Oropharynx: Pink, supple. No suspicious lesions  Neck: Supple without thyromegaly, adenopathy  Lungs: Respirations unlabored; clear to auscultation bilaterally without wheeze, rales, rhonchi  CVS exam: normal rate and regular rhythm.  Vessels: Symmetric bilaterally  Neurologic: Alert and oriented; speech intact; face symmetrical; moves all extremities well; CNII-XII intact without focal deficit   Assessment:  1. Atypical chest pain   2. Flank pain     Plan:  ? Etiology of symptoms; will update EKG today- shows sinus rhythm; will update labs today including CBC, CMP, d-dimer; will update STAT CXR/ rib X-rays and thoracic X-rays; follow up to be determined based on X-ray results.   No follow-ups on file.  Orders Placed This Encounter  Procedures   DG Ribs Bilateral W/Chest    Standing Status:   Future    Expiration Date:   07/10/2024    Reason for Exam (SYMPTOM  OR DIAGNOSIS REQUIRED):   atypical chest pain    Preferred imaging location?:   [facility]   DG Thoracic Spine 2 View    Standing Status:   Future    Expiration Date:   01/08/2024    Reason for Exam (SYMPTOM  OR DIAGNOSIS REQUIRED):   mid- back pain    Preferred imaging location?:   [facility]   Urinalysis   CBC with Differential/Platelet   Comp Met (CMET)   D-Dimer, Quantitative   EKG 12-Lead     Requested Prescriptions    No prescriptions requested or ordered in this encounter

## 2023-07-12 ENCOUNTER — Encounter: Payer: Self-pay | Admitting: Family

## 2023-07-12 ENCOUNTER — Other Ambulatory Visit: Payer: Self-pay | Admitting: Family

## 2023-07-12 DIAGNOSIS — R7989 Other specified abnormal findings of blood chemistry: Secondary | ICD-10-CM

## 2023-07-12 DIAGNOSIS — R0789 Other chest pain: Secondary | ICD-10-CM

## 2023-07-12 LAB — D-DIMER, QUANTITATIVE: D-Dimer, Quant: 1.47 ug{FEU}/mL — ABNORMAL HIGH (ref <0.50)

## 2023-07-16 ENCOUNTER — Emergency Department (HOSPITAL_BASED_OUTPATIENT_CLINIC_OR_DEPARTMENT_OTHER): Payer: Medicare Other

## 2023-07-16 ENCOUNTER — Encounter (HOSPITAL_BASED_OUTPATIENT_CLINIC_OR_DEPARTMENT_OTHER): Payer: Self-pay | Admitting: Urology

## 2023-07-16 ENCOUNTER — Ambulatory Visit: Payer: Self-pay | Admitting: Surgery

## 2023-07-16 ENCOUNTER — Other Ambulatory Visit: Payer: Self-pay

## 2023-07-16 ENCOUNTER — Inpatient Hospital Stay (HOSPITAL_BASED_OUTPATIENT_CLINIC_OR_DEPARTMENT_OTHER)
Admission: EM | Admit: 2023-07-16 | Discharge: 2023-07-27 | DRG: 418 | Disposition: A | Discharge status: Home Health | Payer: Medicare Other | Attending: Family Medicine | Admitting: Family Medicine

## 2023-07-16 ENCOUNTER — Encounter: Payer: Self-pay | Admitting: Family

## 2023-07-16 DIAGNOSIS — Z8261 Family history of arthritis: Secondary | ICD-10-CM

## 2023-07-16 DIAGNOSIS — E876 Hypokalemia: Secondary | ICD-10-CM | POA: Diagnosis not present

## 2023-07-16 DIAGNOSIS — E119 Type 2 diabetes mellitus without complications: Secondary | ICD-10-CM | POA: Diagnosis not present

## 2023-07-16 DIAGNOSIS — N179 Acute kidney failure, unspecified: Secondary | ICD-10-CM | POA: Diagnosis not present

## 2023-07-16 DIAGNOSIS — M1990 Unspecified osteoarthritis, unspecified site: Secondary | ICD-10-CM | POA: Diagnosis not present

## 2023-07-16 DIAGNOSIS — Z79899 Other long term (current) drug therapy: Secondary | ICD-10-CM | POA: Diagnosis not present

## 2023-07-16 DIAGNOSIS — E870 Hyperosmolality and hypernatremia: Secondary | ICD-10-CM | POA: Diagnosis not present

## 2023-07-16 DIAGNOSIS — K82A1 Gangrene of gallbladder in cholecystitis: Secondary | ICD-10-CM | POA: Diagnosis present

## 2023-07-16 DIAGNOSIS — K5750 Diverticulosis of both small and large intestine without perforation or abscess without bleeding: Secondary | ICD-10-CM | POA: Diagnosis not present

## 2023-07-16 DIAGNOSIS — Z7984 Long term (current) use of oral hypoglycemic drugs: Secondary | ICD-10-CM | POA: Diagnosis not present

## 2023-07-16 DIAGNOSIS — E66812 Obesity, class 2: Secondary | ICD-10-CM | POA: Diagnosis not present

## 2023-07-16 DIAGNOSIS — Z79890 Hormone replacement therapy (postmenopausal): Secondary | ICD-10-CM | POA: Diagnosis not present

## 2023-07-16 DIAGNOSIS — E785 Hyperlipidemia, unspecified: Secondary | ICD-10-CM | POA: Diagnosis not present

## 2023-07-16 DIAGNOSIS — G4733 Obstructive sleep apnea (adult) (pediatric): Secondary | ICD-10-CM | POA: Diagnosis present

## 2023-07-16 DIAGNOSIS — I10 Essential (primary) hypertension: Secondary | ICD-10-CM | POA: Diagnosis not present

## 2023-07-16 DIAGNOSIS — E039 Hypothyroidism, unspecified: Secondary | ICD-10-CM | POA: Diagnosis present

## 2023-07-16 DIAGNOSIS — R9431 Abnormal electrocardiogram [ECG] [EKG]: Secondary | ICD-10-CM | POA: Diagnosis not present

## 2023-07-16 DIAGNOSIS — K9189 Other postprocedural complications and disorders of digestive system: Secondary | ICD-10-CM | POA: Diagnosis not present

## 2023-07-16 DIAGNOSIS — K838 Other specified diseases of biliary tract: Secondary | ICD-10-CM | POA: Insufficient documentation

## 2023-07-16 DIAGNOSIS — K812 Acute cholecystitis with chronic cholecystitis: Secondary | ICD-10-CM | POA: Diagnosis not present

## 2023-07-16 DIAGNOSIS — K8020 Calculus of gallbladder without cholecystitis without obstruction: Secondary | ICD-10-CM | POA: Diagnosis not present

## 2023-07-16 DIAGNOSIS — Z6839 Body mass index (BMI) 39.0-39.9, adult: Secondary | ICD-10-CM

## 2023-07-16 DIAGNOSIS — M419 Scoliosis, unspecified: Secondary | ICD-10-CM | POA: Diagnosis not present

## 2023-07-16 DIAGNOSIS — K219 Gastro-esophageal reflux disease without esophagitis: Secondary | ICD-10-CM | POA: Diagnosis present

## 2023-07-16 DIAGNOSIS — K2970 Gastritis, unspecified, without bleeding: Secondary | ICD-10-CM

## 2023-07-16 DIAGNOSIS — I340 Nonrheumatic mitral (valve) insufficiency: Secondary | ICD-10-CM | POA: Diagnosis not present

## 2023-07-16 DIAGNOSIS — M549 Dorsalgia, unspecified: Secondary | ICD-10-CM | POA: Diagnosis not present

## 2023-07-16 DIAGNOSIS — M47814 Spondylosis without myelopathy or radiculopathy, thoracic region: Secondary | ICD-10-CM | POA: Diagnosis not present

## 2023-07-16 DIAGNOSIS — R079 Chest pain, unspecified: Secondary | ICD-10-CM | POA: Diagnosis not present

## 2023-07-16 DIAGNOSIS — M5134 Other intervertebral disc degeneration, thoracic region: Secondary | ICD-10-CM | POA: Diagnosis not present

## 2023-07-16 DIAGNOSIS — K819 Cholecystitis, unspecified: Principal | ICD-10-CM

## 2023-07-16 DIAGNOSIS — Y838 Other surgical procedures as the cause of abnormal reaction of the patient, or of later complication, without mention of misadventure at the time of the procedure: Secondary | ICD-10-CM | POA: Diagnosis not present

## 2023-07-16 DIAGNOSIS — R7881 Bacteremia: Secondary | ICD-10-CM | POA: Diagnosis not present

## 2023-07-16 DIAGNOSIS — K8000 Calculus of gallbladder with acute cholecystitis without obstruction: Secondary | ICD-10-CM | POA: Diagnosis not present

## 2023-07-16 DIAGNOSIS — Z8249 Family history of ischemic heart disease and other diseases of the circulatory system: Secondary | ICD-10-CM

## 2023-07-16 DIAGNOSIS — K2990 Gastroduodenitis, unspecified, without bleeding: Secondary | ICD-10-CM | POA: Diagnosis not present

## 2023-07-16 DIAGNOSIS — Z83438 Family history of other disorder of lipoprotein metabolism and other lipidemia: Secondary | ICD-10-CM

## 2023-07-16 DIAGNOSIS — K5900 Constipation, unspecified: Secondary | ICD-10-CM | POA: Diagnosis present

## 2023-07-16 DIAGNOSIS — K839 Disease of biliary tract, unspecified: Secondary | ICD-10-CM | POA: Diagnosis not present

## 2023-07-16 DIAGNOSIS — K828 Other specified diseases of gallbladder: Secondary | ICD-10-CM | POA: Diagnosis not present

## 2023-07-16 DIAGNOSIS — K810 Acute cholecystitis: Secondary | ICD-10-CM | POA: Diagnosis present

## 2023-07-16 DIAGNOSIS — Z87891 Personal history of nicotine dependence: Secondary | ICD-10-CM

## 2023-07-16 DIAGNOSIS — R1011 Right upper quadrant pain: Secondary | ICD-10-CM | POA: Diagnosis not present

## 2023-07-16 DIAGNOSIS — R197 Diarrhea, unspecified: Secondary | ICD-10-CM | POA: Diagnosis not present

## 2023-07-16 DIAGNOSIS — R109 Unspecified abdominal pain: Secondary | ICD-10-CM | POA: Diagnosis not present

## 2023-07-16 DIAGNOSIS — I358 Other nonrheumatic aortic valve disorders: Secondary | ICD-10-CM | POA: Diagnosis not present

## 2023-07-16 DIAGNOSIS — Z833 Family history of diabetes mellitus: Secondary | ICD-10-CM

## 2023-07-16 DIAGNOSIS — I2699 Other pulmonary embolism without acute cor pulmonale: Secondary | ICD-10-CM | POA: Diagnosis not present

## 2023-07-16 DIAGNOSIS — G8929 Other chronic pain: Secondary | ICD-10-CM | POA: Diagnosis present

## 2023-07-16 DIAGNOSIS — Z9989 Dependence on other enabling machines and devices: Secondary | ICD-10-CM

## 2023-07-16 DIAGNOSIS — K3189 Other diseases of stomach and duodenum: Secondary | ICD-10-CM | POA: Diagnosis not present

## 2023-07-16 HISTORY — DX: Type 2 diabetes mellitus without complications: E11.9

## 2023-07-16 LAB — COMPREHENSIVE METABOLIC PANEL
ALT: 71 U/L — ABNORMAL HIGH (ref 0–44)
AST: 59 U/L — ABNORMAL HIGH (ref 15–41)
Albumin: 3.3 g/dL — ABNORMAL LOW (ref 3.5–5.0)
Alkaline Phosphatase: 112 U/L (ref 38–126)
Anion gap: 10 (ref 5–15)
BUN: 13 mg/dL (ref 8–23)
CO2: 23 mmol/L (ref 22–32)
Calcium: 8.9 mg/dL (ref 8.9–10.3)
Chloride: 104 mmol/L (ref 98–111)
Creatinine, Ser: 1.16 mg/dL — ABNORMAL HIGH (ref 0.44–1.00)
GFR, Estimated: 50 mL/min — ABNORMAL LOW (ref >60)
Glucose, Bld: 121 mg/dL — ABNORMAL HIGH (ref 70–99)
Potassium: 4.2 mmol/L (ref 3.5–5.1)
Sodium: 137 mmol/L (ref 135–145)
Total Bilirubin: 0.8 mg/dL (ref 0.0–1.2)
Total Protein: 7.8 g/dL (ref 6.5–8.1)

## 2023-07-16 LAB — CBC WITH DIFFERENTIAL/PLATELET
Abs Immature Granulocytes: 0.16 10*3/uL — ABNORMAL HIGH (ref 0.00–0.07)
Basophils Absolute: 0.1 10*3/uL (ref 0.0–0.1)
Basophils Relative: 1 %
Eosinophils Absolute: 0.3 10*3/uL (ref 0.0–0.5)
Eosinophils Relative: 2 %
HCT: 40.3 % (ref 36.0–46.0)
Hemoglobin: 12.9 g/dL (ref 12.0–15.0)
Immature Granulocytes: 1 %
Lymphocytes Relative: 12 %
Lymphs Abs: 2 10*3/uL (ref 0.7–4.0)
MCH: 27.2 pg (ref 26.0–34.0)
MCHC: 32 g/dL (ref 30.0–36.0)
MCV: 85 fL (ref 80.0–100.0)
Monocytes Absolute: 1.2 10*3/uL — ABNORMAL HIGH (ref 0.1–1.0)
Monocytes Relative: 7 %
Neutro Abs: 13.5 10*3/uL — ABNORMAL HIGH (ref 1.7–7.7)
Neutrophils Relative %: 77 %
Platelets: 409 10*3/uL — ABNORMAL HIGH (ref 150–400)
RBC: 4.74 MIL/uL (ref 3.87–5.11)
RDW: 13 % (ref 11.5–15.5)
WBC: 17.2 10*3/uL — ABNORMAL HIGH (ref 4.0–10.5)
nRBC: 0 % (ref 0.0–0.2)

## 2023-07-16 LAB — TROPONIN I (HIGH SENSITIVITY): Troponin I (High Sensitivity): 5 ng/L (ref <18)

## 2023-07-16 LAB — LACTIC ACID, PLASMA: Lactic Acid, Venous: 1.4 mmol/L (ref 0.5–1.9)

## 2023-07-16 LAB — LIPASE, BLOOD: Lipase: 22 U/L (ref 11–51)

## 2023-07-16 MED ORDER — MORPHINE SULFATE (PF) 4 MG/ML IV SOLN
4.0000 mg | Freq: Once | INTRAVENOUS | Status: AC
  Administered 2023-07-16: 4 mg via INTRAVENOUS
  Filled 2023-07-16: qty 1

## 2023-07-16 MED ORDER — IOHEXOL 350 MG/ML SOLN
100.0000 mL | Freq: Once | INTRAVENOUS | Status: AC | PRN
  Administered 2023-07-16: 100 mL via INTRAVENOUS

## 2023-07-16 MED ORDER — PIPERACILLIN-TAZOBACTAM 3.375 G IVPB 30 MIN
3.3750 g | Freq: Once | INTRAVENOUS | Status: AC
  Administered 2023-07-16: 3.375 g via INTRAVENOUS
  Filled 2023-07-16: qty 50

## 2023-07-16 NOTE — ED Notes (Signed)
 Patient transported to CT

## 2023-07-16 NOTE — ED Provider Notes (Signed)
 [employer] [facility] AT [facility] Provider Note   CSN: [id number] Arrival date & time: 07/16/23  1257     History  Chief Complaint  Patient presents with   Back Pain    Tricia Dodson is a 73 y.o. female with a history of morbid obesity, arthritis, thyroid  disease, and GERD who presents the ED today for abdominal pain.  Patient reports that she has been seeing her primary care for back pain since 12/26.  Patient had a positive D-dimer with primary care and was told to come here for further evaluation.  She denies any back pain or chest pain at this time but endorses shortness of breath right upper quadrant pain.  She states she has been having lower abdominal pain last week, but for the past couple days she has been having pain in the right upper abdomen.  She denies any associated nausea, vomiting, diarrhea, or urinary symptoms.  No fevers at home.  Home Medications Prior to Admission medications   Medication Sig Start Date End Date Taking? Authorizing Provider  amitriptyline  (ELAVIL ) 10 MG tablet Take 1 tablet (10 mg total) by mouth at bedtime. 06/18/23   Jason Leita Repine, FNP  atorvastatin  (LIPITOR) 10 MG tablet Take 1 tablet (10 mg total) by mouth daily. 06/18/23   Jason Leita Repine, FNP  cyclobenzaprine  (FLEXERIL ) 10 MG tablet Take 0.5-1 tablets (5-10 mg total) by mouth 3 (three) times daily as needed. 07/08/23   Vivienne Delon HERO, PA-C  DULoxetine  (CYMBALTA ) 20 MG capsule Take 1 capsule (20 mg total) by mouth daily. 06/18/23   Jason Leita Repine, FNP  empagliflozin  (JARDIANCE ) 10 MG TABS tablet Take 1 tablet (10 mg total) by mouth daily. 06/18/23   Jason Leita Repine, FNP  fenofibrate  micronized (LOFIBRA) 134 MG capsule Take 1 capsule (134 mg total) by mouth daily before breakfast. 06/18/23   Jason Leita Repine, FNP  gabapentin  (NEURONTIN ) 300 MG capsule Take 1 capsule (300 mg total) by mouth 2 (two) times daily. 06/18/23   Jason Leita Repine, FNP  ipratropium (ATROVENT ) 0.03 % nasal spray Place 2 sprays into both nostrils every 12 (twelve) hours. 05/01/22   Jason Leita Repine, FNP  levocetirizine (XYZAL ) 5 MG tablet Take 1 tablet (5 mg total) by mouth every evening. 06/18/23   Jason Leita Repine, FNP  levothyroxine  (SYNTHROID ) 25 MCG tablet TAKE 1 TABLET(25 MCG) BY MOUTH DAILY 06/18/23   Jason Leita Repine, FNP  naproxen  (NAPROSYN ) 500 MG tablet Take 1 tablet (500 mg total) by mouth 2 (two) times daily with a meal. 07/08/23   Burnette, Delon HERO, PA-C  omeprazole  (PRILOSEC) 40 MG capsule TAKE 1 CAPSULE(40 MG) BY MOUTH TWICE DAILY 08/08/22   Jason Leita Repine, FNP      Allergies    Patient has no known allergies.    Review of Systems   Review of Systems  Musculoskeletal:  Positive for back pain.  All other systems reviewed and are negative.   Physical Exam Updated Vital Signs BP 136/68   Pulse 92   Temp 97.7 F (36.5 C)   Resp 18   Ht 5' 7 (1.702 m)   Wt 115 kg   SpO2 97%   BMI 39.71 kg/m  Physical Exam Vitals and nursing note reviewed.  Constitutional:      Appearance: Normal appearance.  HENT:     Head: Normocephalic and atraumatic.     Mouth/Throat:     Mouth: Mucous membranes are moist.  Eyes:  Conjunctiva/sclera: Conjunctivae normal.     Pupils: Pupils are equal, round, and reactive to light.  Cardiovascular:     Rate and Rhythm: Normal rate and regular rhythm.     Pulses: Normal pulses.  Pulmonary:     Effort: Pulmonary effort is normal.     Breath sounds: Normal breath sounds.  Abdominal:     Palpations: Abdomen is soft.     Tenderness: There is abdominal tenderness. There is guarding and rebound. There is no right CVA tenderness or left CVA tenderness.  Skin:    General: Skin is warm and dry.     Findings: No rash.  Neurological:     General: No focal deficit present.     Mental Status: She is alert.  Psychiatric:        Mood and Affect: Mood normal.         Behavior: Behavior normal.    ED Results / Procedures / Treatments   Labs (all labs ordered are listed, but only abnormal results are displayed) Labs Reviewed  CBC WITH DIFFERENTIAL/PLATELET - Abnormal; Notable for the following components:      Result Value   WBC 17.2 (*)    Platelets 409 (*)    Neutro Abs 13.5 (*)    Monocytes Absolute 1.2 (*)    Abs Immature Granulocytes 0.16 (*)    All other components within normal limits  COMPREHENSIVE METABOLIC PANEL - Abnormal; Notable for the following components:   Glucose, Bld 121 (*)    Creatinine, Ser 1.16 (*)    Albumin 3.3 (*)    AST 59 (*)    ALT 71 (*)    GFR, Estimated 50 (*)    All other components within normal limits  CULTURE, BLOOD (ROUTINE X 2)  CULTURE, BLOOD (ROUTINE X 2)  LIPASE, BLOOD  LACTIC ACID, PLASMA  URINALYSIS, W/ REFLEX TO CULTURE (INFECTION SUSPECTED)  LACTIC ACID, PLASMA  TROPONIN I (HIGH SENSITIVITY)    EKG EKG Interpretation Date/Time:  Tuesday July 16 2023 15:32:31 EST Ventricular Rate:  81 PR Interval:  188 QRS Duration:  94 QT Interval:  412 QTC Calculation: 478 R Axis:   -29  Text Interpretation: Normal sinus rhythm Confirmed by Ruthe Cornet [phone]) on 07/16/2023 3:36:18 PM  Radiology US  Abdomen Limited RUQ (LIVER/GB) Result Date: 07/16/2023 CLINICAL DATA:  Right upper quadrant pain. Abnormal gallbladder on CT. EXAM: ULTRASOUND ABDOMEN LIMITED RIGHT UPPER QUADRANT COMPARISON:  Ultrasound earlier today FINDINGS: Gallbladder: Gallstones within the gallbladder measuring up to 1.6 cm. There is sludge layering in the gallbladder. Wall thickening of the 6 mm. Patient was tender over the gallbladder during the study. Pericholecystic fluid noted. Common bile duct: Diameter: Not visualized Liver: Increased echotexture compatible with fatty infiltration. No focal abnormality or biliary ductal dilatation. Portal vein is patent on color Doppler imaging with normal direction of blood flow towards the  liver. Other: None. IMPRESSION: Cholelithiasis. Sludge, gallbladder wall thickening and pericholecystic with sonographic Murphy sign. Findings compatible with acute cholecystitis. Fatty liver. Electronically Signed   By: Franky Crease M.D.   On: 07/16/2023 21:10   CT ABDOMEN PELVIS WO CONTRAST Result Date: 07/16/2023 CLINICAL DATA:  Upper abdominal and back pain EXAM: CT ABDOMEN AND PELVIS WITHOUT CONTRAST TECHNIQUE: Multidetector CT imaging of the abdomen and pelvis was performed following the standard protocol without IV contrast. RADIATION DOSE REDUCTION: This exam was performed according to the departmental dose-optimization program which includes automated exposure control, adjustment of the mA and/or kV according to patient size and/or  use of iterative reconstruction technique. COMPARISON:  Overlapping portions CT chest 07/16/2023 FINDINGS: Lower chest: Unremarkable Hepatobiliary: Substantial abnormal wall thickening in the gallbladder with associated pericholecystic stranding. Several gallstones measure up to 1 cm in diameter. There is also a go gallstone measuring 1 cm in the neck of the gallbladder. No CBD dilatation is observed. Appearance raises suspicion for acute cholecystitis. Pancreas: Unremarkable Spleen: Unremarkable Adrenals/Urinary Tract: Both adrenal glands appear normal. Excreted contrast observed in the collecting systems, ureters, and urinary bladder. No significant abnormality observed. Stomach/Bowel: Periampullary duodenal diverticulum. There are diverticula in the fourth portion of the duodenum without signs of inflammation. Normal appendix. There are few scattered sigmoid colon diverticula. Vascular/Lymphatic: Atherosclerosis is present, including aortoiliac atherosclerotic disease. Reproductive: Calcified lesion favoring fibroid along the left uterine body. Masslike central uterine lesion probably represents leiomyoma given the similar appearance on pelvic ultrasound of 01/27/2021.  Other: No supplemental non-categorized findings. Musculoskeletal: Mild grade 1 degenerative anterolisthesis at L4-5. IMPRESSION: 1. Substantial abnormal wall thickening in the gallbladder with associated pericholecystic stranding and cholelithiasis. Appearance suspicious for acute cholecystitis. 2. Periampullary and distal duodenal diverticula without diverticular inflammation. 3. Sigmoid colon diverticulosis. 4. Mild grade 1 degenerative anterolisthesis at L4-5. 5. Large central uterine leiomyoma as reported on a prior pelvic ultrasound. 6. Aortic atherosclerosis. Aortic Atherosclerosis (ICD10-I70.0). Electronically Signed   By: Ryan Salvage M.D.   On: 07/16/2023 19:29   CT T-SPINE NO CHARGE Result Date: 07/16/2023 CLINICAL DATA:  Back pain EXAM: CT THORACIC SPINE WITHOUT CONTRAST TECHNIQUE: Multidetector CT images of the thoracic were obtained using the standard protocol without intravenous contrast. RADIATION DOSE REDUCTION: This exam was performed according to the departmental dose-optimization program which includes automated exposure control, adjustment of the mA and/or kV according to patient size and/or use of iterative reconstruction technique. COMPARISON:  None Available. FINDINGS: Alignment: Dextroscoliosis. Sagittal alignment within normal limits. No subluxation Vertebrae: No acute fracture or focal pathologic process. Paraspinal and other soft tissues: Negative. Disc levels: Multilevel degenerative osteophytes. No abnormal disc space narrowing or widening. IMPRESSION: Dextroscoliosis with mild degenerative change. No acute osseous abnormality. Electronically Signed   By: Luke Bun M.D.   On: 07/16/2023 18:54   CT Angio Chest PE W and/or Wo Contrast Result Date: 07/16/2023 CLINICAL DATA:  Pain and tenderness across the middle of the back for the past week, worse with a deep breath. History of shingles. Clinical concern pulmonary embolism. EXAM: CT ANGIOGRAPHY CHEST WITH CONTRAST TECHNIQUE:  Multidetector CT imaging of the chest was performed using the standard protocol during bolus administration of intravenous contrast. Multiplanar CT image reconstructions and MIPs were obtained to evaluate the vascular anatomy. RADIATION DOSE REDUCTION: This exam was performed according to the departmental dose-optimization program which includes automated exposure control, adjustment of the mA and/or kV according to patient size and/or use of iterative reconstruction technique. CONTRAST:  OMNIPAQUE  IOHEXOL  350 MG/ML SOLN COMPARISON:  Chest and bilateral rib radiographs dated 07/11/2023. FINDINGS: Cardiovascular: Mild atheromatous aortic calcifications. Normally opacified pulmonary arteries with no pulmonary arterial filling defects seen. Borderline enlarged heart. No pericardial effusion. Mediastinum/Nodes: No enlarged mediastinal, hilar, or axillary lymph nodes. Thyroid  gland, trachea, and esophagus demonstrate no significant findings. Lungs/Pleura: Lungs are clear. No pleural effusion or pneumothorax. Upper Abdomen: Mild diffuse low-density of the liver. Musculoskeletal: Thoracic and lower cervical spine degenerative changes and mild scoliosis. Review of the MIP images confirms the above findings. IMPRESSION: 1. No pulmonary embolism or other acute abnormality. 2. Borderline cardiomegaly. 3. Mild hepatic steatosis. 4. Aortic atherosclerosis. Aortic  Atherosclerosis (ICD10-I70.0). Electronically Signed   By: Elspeth Bathe M.D.   On: 07/16/2023 18:20    Procedures Procedures: not indicated.   Medications Ordered in ED Medications  morphine  (PF) 4 MG/ML injection 4 mg (has no administration in time range)  iohexol  (OMNIPAQUE ) 350 MG/ML injection 100 mL (100 mLs Intravenous Contrast Given 07/16/23 1656)  morphine  (PF) 4 MG/ML injection 4 mg (4 mg Intravenous Given 07/16/23 1816)  piperacillin -tazobactam (ZOSYN ) IVPB 3.375 g (3.375 g Intravenous New Bag/Given 07/16/23 2022)    ED Course/ Medical Decision  Making/ A&P                                 Medical Decision Making Amount and/or Complexity of Data Reviewed Labs: ordered. Radiology: ordered.  Risk Prescription drug management. Decision regarding hospitalization.   This patient presents to the ED for concern of abdominal pain, this involves an extensive number of treatment options, and is a complaint that carries with it a high risk of complications and morbidity.   Differential diagnosis includes: gastritis, gastroenteritis, IBS, IBD, PUG, gastric ulcers, pancreatitis, cholangitis, choledocholithiasis, cholecystitis, colitis, appendicitis, bowel obstruction, perforation, etc.   Comorbidities  See HPI above   Additional History  Additional history obtained from prior records.   Lab Tests  I ordered and personally interpreted labs.  The pertinent results include:   AST 59,  ALT 71 otherwise CMP is within normal limits WBC of 17.2 Lipase unremarkable Troponins unremarkable Lactic acid of 1.4   Imaging Studies  I ordered imaging studies including CTA PE study, CT lumbar spine, CT abdomen/pelvis, abdomen ultrasound  I independently visualized and interpreted imaging which showed:  Sludge, gallbladder wall thickening and pericholecystic with sonographic Murphy sign - compatible with acute cholecystitis. I agree with the radiologist interpretation   Consultations  I requested consultation with Dr. Vanderbilt with general surgery,  and discussed lab and imaging findings as well as pertinent plan - they recommend: clear liquid diet until midnight. Hold patient at [facility] and transfer to [facility] in the AM. Call Dr. Vernetta at 7 AM, he will be the doctor at [facility] in the AM.   Problem List / ED Course / Critical Interventions / Medication Management  Right upper quadrant pain for the past several days I ordered medications including: Morphine  for pain Zosyn  for infection  Reevaluation of the patient after  these medicines showed that the patient improved I have reviewed the patients home medicines and have made adjustments as needed Discussed findings with patient. All questions were answered.   Social Determinants of Health  Physical activity   Test / Admission - Considered  Patient is agreeable with plan for surgery in the AM.       Final Clinical Impression(s) / ED Diagnoses Final diagnoses:  Cholecystitis    Rx / DC Orders ED Discharge Orders     None         Waddell Sluder, PA-C 07/16/23 2216    Ruthe Cornet, DO 07/16/23 2335

## 2023-07-16 NOTE — ED Notes (Signed)
 Fall risk armband Fall risk sign on door  Fall risk socks

## 2023-07-16 NOTE — ED Triage Notes (Signed)
 Pt states pain across middle of back x 1 week  Was seen at PCP, xr normal, sent here for CT scan  States was tender all the way around, states torso no painful  Also states pain worse with deep breath and palpation    H/o shingles

## 2023-07-17 ENCOUNTER — Encounter (HOSPITAL_COMMUNITY)
Admission: EM | Disposition: A | Discharge status: Home Health | Payer: Self-pay | Source: Home / Self Care | Attending: Internal Medicine

## 2023-07-17 ENCOUNTER — Emergency Department (HOSPITAL_COMMUNITY): Payer: Medicare Other

## 2023-07-17 ENCOUNTER — Encounter (HOSPITAL_COMMUNITY): Payer: Self-pay | Admitting: Internal Medicine

## 2023-07-17 DIAGNOSIS — Z79899 Other long term (current) drug therapy: Secondary | ICD-10-CM | POA: Diagnosis not present

## 2023-07-17 DIAGNOSIS — E039 Hypothyroidism, unspecified: Secondary | ICD-10-CM | POA: Diagnosis not present

## 2023-07-17 DIAGNOSIS — K812 Acute cholecystitis with chronic cholecystitis: Secondary | ICD-10-CM | POA: Diagnosis not present

## 2023-07-17 DIAGNOSIS — K810 Acute cholecystitis: Secondary | ICD-10-CM | POA: Diagnosis present

## 2023-07-17 DIAGNOSIS — K5900 Constipation, unspecified: Secondary | ICD-10-CM | POA: Diagnosis not present

## 2023-07-17 DIAGNOSIS — K219 Gastro-esophageal reflux disease without esophagitis: Secondary | ICD-10-CM | POA: Diagnosis not present

## 2023-07-17 DIAGNOSIS — G8929 Other chronic pain: Secondary | ICD-10-CM | POA: Diagnosis not present

## 2023-07-17 DIAGNOSIS — E870 Hyperosmolality and hypernatremia: Secondary | ICD-10-CM | POA: Diagnosis not present

## 2023-07-17 DIAGNOSIS — M1990 Unspecified osteoarthritis, unspecified site: Secondary | ICD-10-CM | POA: Diagnosis not present

## 2023-07-17 DIAGNOSIS — I10 Essential (primary) hypertension: Secondary | ICD-10-CM | POA: Diagnosis not present

## 2023-07-17 DIAGNOSIS — K2970 Gastritis, unspecified, without bleeding: Secondary | ICD-10-CM | POA: Diagnosis not present

## 2023-07-17 DIAGNOSIS — K8000 Calculus of gallbladder with acute cholecystitis without obstruction: Secondary | ICD-10-CM | POA: Diagnosis not present

## 2023-07-17 DIAGNOSIS — Y838 Other surgical procedures as the cause of abnormal reaction of the patient, or of later complication, without mention of misadventure at the time of the procedure: Secondary | ICD-10-CM | POA: Diagnosis not present

## 2023-07-17 DIAGNOSIS — E119 Type 2 diabetes mellitus without complications: Secondary | ICD-10-CM | POA: Diagnosis not present

## 2023-07-17 DIAGNOSIS — E876 Hypokalemia: Secondary | ICD-10-CM | POA: Diagnosis not present

## 2023-07-17 DIAGNOSIS — I358 Other nonrheumatic aortic valve disorders: Secondary | ICD-10-CM | POA: Diagnosis not present

## 2023-07-17 DIAGNOSIS — Z79890 Hormone replacement therapy (postmenopausal): Secondary | ICD-10-CM | POA: Diagnosis not present

## 2023-07-17 DIAGNOSIS — Z87891 Personal history of nicotine dependence: Secondary | ICD-10-CM | POA: Diagnosis not present

## 2023-07-17 DIAGNOSIS — G4733 Obstructive sleep apnea (adult) (pediatric): Secondary | ICD-10-CM | POA: Diagnosis not present

## 2023-07-17 DIAGNOSIS — E66812 Obesity, class 2: Secondary | ICD-10-CM | POA: Diagnosis not present

## 2023-07-17 DIAGNOSIS — K2990 Gastroduodenitis, unspecified, without bleeding: Secondary | ICD-10-CM | POA: Diagnosis not present

## 2023-07-17 DIAGNOSIS — K9189 Other postprocedural complications and disorders of digestive system: Secondary | ICD-10-CM | POA: Diagnosis not present

## 2023-07-17 DIAGNOSIS — E785 Hyperlipidemia, unspecified: Secondary | ICD-10-CM | POA: Diagnosis not present

## 2023-07-17 DIAGNOSIS — Z7984 Long term (current) use of oral hypoglycemic drugs: Secondary | ICD-10-CM | POA: Diagnosis not present

## 2023-07-17 DIAGNOSIS — I340 Nonrheumatic mitral (valve) insufficiency: Secondary | ICD-10-CM | POA: Diagnosis not present

## 2023-07-17 DIAGNOSIS — N179 Acute kidney failure, unspecified: Secondary | ICD-10-CM | POA: Diagnosis not present

## 2023-07-17 DIAGNOSIS — K82A1 Gangrene of gallbladder in cholecystitis: Secondary | ICD-10-CM | POA: Diagnosis not present

## 2023-07-17 LAB — URINALYSIS, W/ REFLEX TO CULTURE (INFECTION SUSPECTED)
Glucose, UA: >=500 mg/dL — AB
Hgb urine dipstick: NEGATIVE
Ketones, ur: NEGATIVE mg/dL
Leukocytes,Ua: NEGATIVE
Nitrite: NEGATIVE
Protein, ur: 100 mg/dL — AB
Specific Gravity, Urine: 1.02 (ref 1.005–1.030)
pH: 5 (ref 5.0–8.0)

## 2023-07-17 LAB — CBC
HCT: 40.3 % (ref 36.0–46.0)
Hemoglobin: 12.5 g/dL (ref 12.0–15.0)
MCH: 26.8 pg (ref 26.0–34.0)
MCHC: 31 g/dL (ref 30.0–36.0)
MCV: 86.5 fL (ref 80.0–100.0)
Platelets: 414 10*3/uL — ABNORMAL HIGH (ref 150–400)
RBC: 4.66 MIL/uL (ref 3.87–5.11)
RDW: 13.2 % (ref 11.5–15.5)
WBC: 14.6 10*3/uL — ABNORMAL HIGH (ref 4.0–10.5)
nRBC: 0 % (ref 0.0–0.2)

## 2023-07-17 LAB — CREATININE, SERUM
Creatinine, Ser: 1.25 mg/dL — ABNORMAL HIGH (ref 0.44–1.00)
GFR, Estimated: 46 mL/min — ABNORMAL LOW (ref >60)

## 2023-07-17 LAB — GLUCOSE, CAPILLARY: Glucose-Capillary: 128 mg/dL — ABNORMAL HIGH (ref 70–99)

## 2023-07-17 SURGERY — LAPAROSCOPIC CHOLECYSTECTOMY
Anesthesia: General

## 2023-07-17 MED ORDER — INDOCYANINE GREEN 25 MG IV SOLR
2.5000 mg | Freq: Once | INTRAVENOUS | Status: DC
  Filled 2023-07-17: qty 10

## 2023-07-17 MED ORDER — SODIUM CHLORIDE 0.9 % IV SOLN: 3.0000 g | INTRAVENOUS | Status: DC

## 2023-07-17 MED ORDER — OXYCODONE HCL 5 MG PO TABS: 5.0000 mg | ORAL_TABLET | ORAL | Status: DC | PRN

## 2023-07-17 MED ORDER — ACETAMINOPHEN 325 MG PO TABS
650.0000 mg | ORAL_TABLET | ORAL | Status: AC
  Administered 2023-07-17 – 2023-07-20 (×15): 650 mg via ORAL
  Filled 2023-07-17 (×16): qty 2

## 2023-07-17 MED ORDER — CHLORHEXIDINE GLUCONATE CLOTH 2 % EX PADS: 6.0000 | MEDICATED_PAD | Freq: Once | CUTANEOUS | Status: DC

## 2023-07-17 MED ORDER — ENOXAPARIN SODIUM 40 MG/0.4ML IJ SOSY: 40.0000 mg | PREFILLED_SYRINGE | INTRAMUSCULAR | Status: DC

## 2023-07-17 MED ORDER — ORAL CARE MOUTH RINSE: 15.0000 mL | Freq: Once | OROMUCOSAL | Status: AC

## 2023-07-17 MED ORDER — PANTOPRAZOLE SODIUM 40 MG PO TBEC
40.0000 mg | DELAYED_RELEASE_TABLET | Freq: Every day | ORAL | Status: DC
  Administered 2023-07-17 – 2023-07-27 (×10): 40 mg via ORAL
  Filled 2023-07-17 (×11): qty 1

## 2023-07-17 MED ORDER — LACTATED RINGERS IV SOLN: INTRAVENOUS | Status: DC

## 2023-07-17 MED ORDER — CHLORHEXIDINE GLUCONATE 0.12 % MT SOLN: 15.0000 mL | Freq: Once | OROMUCOSAL | Status: AC

## 2023-07-17 MED ORDER — POLYETHYLENE GLYCOL 3350 17 G PO PACK: 17.0000 g | PACK | Freq: Every day | ORAL | Status: DC | PRN

## 2023-07-17 MED ORDER — SODIUM CHLORIDE 0.9 % IV SOLN
INTRAVENOUS | Status: AC
  Filled 2023-07-17: qty 3

## 2023-07-17 MED ORDER — SODIUM CHLORIDE 0.9% FLUSH
3.0000 mL | Freq: Two times a day (BID) | INTRAVENOUS | Status: DC
  Administered 2023-07-17 – 2023-07-27 (×16): 3 mL via INTRAVENOUS

## 2023-07-17 MED ORDER — PIPERACILLIN-TAZOBACTAM 3.375 G IVPB
3.3750 g | Freq: Three times a day (TID) | INTRAVENOUS | Status: DC
  Administered 2023-07-17 – 2023-07-19 (×6): 3.375 g via INTRAVENOUS
  Filled 2023-07-17 (×6): qty 50

## 2023-07-17 MED ORDER — IPRATROPIUM-ALBUTEROL 0.5-2.5 (3) MG/3ML IN SOLN: 3.0000 mL | RESPIRATORY_TRACT | Status: DC | PRN

## 2023-07-17 MED ORDER — DULOXETINE HCL 20 MG PO CPEP
20.0000 mg | ORAL_CAPSULE | Freq: Every day | ORAL | Status: DC
  Administered 2023-07-19 – 2023-07-27 (×8): 20 mg via ORAL
  Filled 2023-07-17 (×11): qty 1

## 2023-07-17 MED ORDER — GABAPENTIN 300 MG PO CAPS
300.0000 mg | ORAL_CAPSULE | Freq: Two times a day (BID) | ORAL | Status: DC
  Administered 2023-07-17 – 2023-07-27 (×19): 300 mg via ORAL
  Filled 2023-07-17 (×20): qty 1

## 2023-07-17 MED ORDER — ACETAMINOPHEN 500 MG PO TABS: 1000.0000 mg | ORAL_TABLET | Freq: Four times a day (QID) | ORAL | Status: DC

## 2023-07-17 MED ORDER — ATORVASTATIN CALCIUM 10 MG PO TABS
10.0000 mg | ORAL_TABLET | Freq: Every day | ORAL | Status: DC
  Administered 2023-07-17 – 2023-07-27 (×10): 10 mg via ORAL
  Filled 2023-07-17 (×11): qty 1

## 2023-07-17 MED ORDER — KCL IN DEXTROSE-NACL 20-5-0.45 MEQ/L-%-% IV SOLN: INTRAVENOUS | Status: DC

## 2023-07-17 MED ORDER — MORPHINE SULFATE (PF) 4 MG/ML IV SOLN
4.0000 mg | Freq: Once | INTRAVENOUS | Status: AC
Start: 2023-07-17 — End: 2023-07-17
  Administered 2023-07-17: 4 mg via INTRAVENOUS
  Filled 2023-07-17: qty 1

## 2023-07-17 MED ORDER — OXYCODONE HCL 5 MG PO TABS
5.0000 mg | ORAL_TABLET | ORAL | Status: DC | PRN
  Administered 2023-07-19 – 2023-07-26 (×12): 5 mg via ORAL
  Filled 2023-07-17 (×12): qty 1

## 2023-07-17 MED ORDER — SENNOSIDES-DOCUSATE SODIUM 8.6-50 MG PO TABS
2.0000 | ORAL_TABLET | Freq: Two times a day (BID) | ORAL | Status: DC
  Administered 2023-07-17 – 2023-07-27 (×8): 2 via ORAL
  Filled 2023-07-17 (×18): qty 2

## 2023-07-17 MED ORDER — ENOXAPARIN SODIUM 60 MG/0.6ML IJ SOSY
55.0000 mg | PREFILLED_SYRINGE | Freq: Every day | INTRAMUSCULAR | Status: DC
  Administered 2023-07-18 – 2023-07-21 (×4): 55 mg via SUBCUTANEOUS
  Filled 2023-07-17 (×5): qty 0.6

## 2023-07-17 MED ORDER — CHLORHEXIDINE GLUCONATE 0.12 % MT SOLN
OROMUCOSAL | Status: AC
  Administered 2023-07-17: 15 mL via OROMUCOSAL
  Filled 2023-07-17: qty 15

## 2023-07-17 MED ORDER — SODIUM CHLORIDE 0.9 % IV SOLN
Freq: Once | INTRAVENOUS | Status: AC
Start: 2023-07-17 — End: 2023-07-17

## 2023-07-17 MED ORDER — AMITRIPTYLINE HCL 10 MG PO TABS
10.0000 mg | ORAL_TABLET | Freq: Every day | ORAL | Status: DC
  Administered 2023-07-17 – 2023-07-26 (×10): 10 mg via ORAL
  Filled 2023-07-17 (×11): qty 1

## 2023-07-17 MED ORDER — EMPAGLIFLOZIN 10 MG PO TABS: 10.0000 mg | ORAL_TABLET | Freq: Every day | ORAL | Status: DC

## 2023-07-17 MED ORDER — LEVOTHYROXINE SODIUM 25 MCG PO TABS
25.0000 ug | ORAL_TABLET | Freq: Every day | ORAL | Status: DC
  Administered 2023-07-17 – 2023-07-27 (×10): 25 ug via ORAL
  Filled 2023-07-17 (×13): qty 1

## 2023-07-17 MED ORDER — HYDROMORPHONE HCL 1 MG/ML IJ SOLN
0.5000 mg | INTRAMUSCULAR | Status: DC | PRN
  Administered 2023-07-17 – 2023-07-27 (×13): 0.5 mg via INTRAVENOUS
  Filled 2023-07-17 (×13): qty 0.5

## 2023-07-17 NOTE — ED Notes (Signed)
 Notified by Genevive Bi PA that patients surgery was cancelled today and admit [facility] to [facility] or [facility] EDP notified. Patient informed. Pt will remain NPO

## 2023-07-17 NOTE — Progress Notes (Signed)
 Pt arrived to 6 north room 23 alert and oriented x4. Patient ambulated from stretcher to bed with standby assist. Pain level 4/10. Bed in lowest position. Call light in reach. Family at bedside. Will continue to monitor pt.

## 2023-07-17 NOTE — H&P (Signed)
 History and Physical    Patient: Tricia Dodson FMW:[id number] DOB: 10-07-1950 DOA: 07/16/2023 DOS: the patient was seen and examined on 07/17/2023 PCP: Jason Leita Repine, FNP  Patient coming from: Home>[facility]> [facility] inpatient unit  Chief Complaint:  Chief Complaint  Patient presents with   Back Pain   HPI: Tricia Dodson is a 73 y.o. female with medical history significant of obstructive sleep apnea, CPAP use, obesity.  Patient reports being in her usual state of health till approximately 4 days ago when she reports an abrupt onset of right upper quadrant abdominal pain with slight radiation posteriorly laterally.  This was associated with marked diminishment of appetite.  No report of fever or vomiting or diarrhea.  Patient actually has been constipated.  Patient actually presented to [facility] ER yesterday with above complaints.  White blood cell of 17.2 lipase within normal limits.  AST ALT slightly elevated at 59 and 71 respectively.  Lactic acid of 1.4.  Ultrasound abdomen showed sonographic Murphy sign compatible with acute cholecystitis.  Patient was awaiting bed placement in ER since yesterday.  Finally transferred to [facility] earlier this afternoon.  Medical evaluation is sought.  Patient continues to have right upper quadrant area abdominal pain.  Poor appetite.  But thirsty.  No fevers or rigors.  Patient otherwise reports feeling okay Review of Systems: As mentioned in the history of present illness. All other systems reviewed and are negative. Past Medical History:  Diagnosis Date   Allergy    Arthritis    Carpal tunnel syndrome, bilateral 11/04/2020   Both wrists.    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 She has never used smokeless tobacco. She reports current alcohol use. No history on file for drug use.  No Known Allergies  Family History  Problem Relation Age of Onset   Arthritis Mother    Diabetes Father    Heart disease Father    Arthritis Sister    Diabetes Sister    Hyperlipidemia Sister    Hyperlipidemia Brother    Heart disease Brother        triple heart bypass   Sleep apnea Brother     Prior to Admission medications   Medication Sig Start Date End Date Taking? Authorizing Provider  amitriptyline  (ELAVIL ) 10 MG tablet Take 1 tablet (10 mg total) by mouth at bedtime. 06/18/23  Yes Jason Leita Repine, FNP  atorvastatin  (LIPITOR) 10 MG tablet Take 1 tablet (10 mg total) by mouth daily. Patient taking differently: Take 10 mg by mouth at bedtime. 06/18/23  Yes Jason Leita Repine, FNP  cyclobenzaprine  (FLEXERIL ) 10 MG tablet Take 0.5-1 tablets (5-10 mg total) by mouth 3 (three) times daily as needed. 07/08/23  Yes Vivienne Delon HERO, PA-C  DULoxetine  (CYMBALTA ) 20 MG capsule Take 1 capsule (20 mg total) by mouth daily. 06/18/23  Yes Jason Leita Repine, FNP  empagliflozin  (JARDIANCE ) 10 MG TABS tablet Take 1 tablet (10 mg total) by mouth daily. 06/18/23  Yes Jason Leita Repine, FNP  fenofibrate  micronized (LOFIBRA) 134 MG  capsule Take 1 capsule (134 mg total) by mouth daily before breakfast. 06/18/23  Yes Jason Leita Repine, FNP  gabapentin  (NEURONTIN ) 300 MG capsule Take 1 capsule (300 mg total) by mouth 2 (two) times daily. 06/18/23  Yes Jason Leita Repine, FNP  ipratropium (ATROVENT ) 0.03 % nasal spray Place 2 sprays into both nostrils every 12 (twelve) hours. Patient taking differently: Place 2 sprays into both nostrils as needed for rhinitis. 05/01/22  Yes Jason Leita Repine, FNP  levocetirizine (XYZAL ) 5 MG tablet Take 1 tablet (5 mg total) by mouth every evening. 06/18/23  Yes Jason Leita Repine, FNP  levothyroxine  (SYNTHROID ) 25 MCG tablet TAKE 1 TABLET(25 MCG) BY MOUTH DAILY 06/18/23  Yes Jason Leita Repine, FNP  losartan  (COZAAR ) 50 MG tablet Take 50 mg by mouth every evening. 04/25/21  Yes [provider]  naproxen  (NAPROSYN ) 500 MG tablet Take 1 tablet (500 mg total) by mouth 2 (two) times daily with a meal. 07/08/23  Yes Burnette, Jennifer M, PA-C  omeprazole  (PRILOSEC) 40 MG capsule TAKE 1 CAPSULE(40 MG) BY MOUTH TWICE DAILY Patient taking differently: Take 40 mg by mouth in the morning. 08/08/22  Yes Jason Leita Repine, FNP    Physical Exam: Vitals:   07/17/23 1113 07/17/23 1228 07/17/23 1418 07/17/23 1724  BP:  121/72 116/72 129/68  Pulse:  87 89 87  Resp:  18 17 16   Temp: 98.9 F (37.2 C)  98.5 F (36.9 C) 98.4 F (36.9 C)  TempSrc: Oral  Oral Oral  SpO2:  94% 93% 94%  Weight:   114.3 kg   Height:   5' 7 (1.702 m)    General: Patient is obese, appears to be in no distress.  Alert awake gives a coherent account of her symptoms Respiratory exam bilateral basilar crackles are heard.  However no cough patient looking nontoxic Abdomen right lower and upper area tenderness with guarding.  Bowel sounds are normal Abdomen otherwise soft Extremities warm without edema. Data Reviewed:  Labs on Admission:  Results for orders placed or performed during the  hospital encounter of 07/16/23 (from the past 24 hours)  Blood culture (routine x 2)     Status: None (Preliminary result)   Collection Time: 07/16/23  8:14 PM   Specimen: Right Antecubital; Blood  Result Value Ref Range   Specimen Description      RIGHT ANTECUBITAL Performed at [facility], [address] [place], [place] [address]    Special Requests      BOTTLES DRAWN AEROBIC AND ANAEROBIC Blood Culture adequate volume Performed at [facility], [address] [place], [place] [address]    Culture      NO GROWTH < 12 HOURS Performed at [facility], [address] [place], [place] [address]    Report Status PENDING   Blood culture (  routine x 2)     Status: None (Preliminary result)   Collection Time: 07/16/23  8:14 PM   Specimen: Left Antecubital; Blood  Result Value Ref Range   Specimen Description      LEFT ANTECUBITAL Performed at [facility], [address] [place], [place] [address]    Special Requests      BOTTLES DRAWN AEROBIC AND ANAEROBIC Blood Culture adequate volume Performed at [facility], [address] [place], [place] [address]    Culture      NO GROWTH < 12 HOURS Performed at [facility], [address] [place], [place] [address]    Report Status PENDING   Urinalysis, w/ Reflex to Culture (Infection Suspected) -Urine, Clean Catch     Status: Abnormal   Collection Time: 07/16/23  8:15 PM  Result Value Ref Range   Specimen Source URINE, CLEAN CATCH    Color, Urine YELLOW YELLOW   APPearance HAZY (A) CLEAR   Specific Gravity, Urine 1.020 1.005 - 1.030   pH 5.0 5.0 - 8.0   Glucose, UA >=500 (A) NEGATIVE mg/dL   Hgb urine dipstick NEGATIVE NEGATIVE   Bilirubin Urine SMALL (A) NEGATIVE   Ketones, ur NEGATIVE NEGATIVE mg/dL   Protein, ur 899 (A) NEGATIVE mg/dL   Nitrite NEGATIVE NEGATIVE   Leukocytes,Ua NEGATIVE NEGATIVE   Squamous Epithelial / HPF 0-5 0 - 5 /HPF   WBC, UA 6-10 0 - 5  WBC/hpf   RBC / HPF 0-5 0 - 5 RBC/hpf   Bacteria, UA FEW (A) NONE SEEN   Budding Yeast PRESENT    Granular Casts, UA PRESENT   Lactic acid, plasma     Status: None   Collection Time: 07/16/23  8:15 PM  Result Value Ref Range   Lactic Acid, Venous 1.4 0.5 - 1.9 mmol/L  Glucose, capillary     Status: Abnormal   Collection Time: 07/17/23  2:22 PM  Result Value Ref Range   Glucose-Capillary 128 (H) 70 - 99 mg/dL   Basic Metabolic Panel: Recent Labs  Lab 07/11/23 1008 07/16/23 1522  NA 142 137  K 4.3 4.2  CL 103 104  CO2 28 23  GLUCOSE 129* 121*  BUN 13 13  CREATININE 1.03 1.16*  CALCIUM  9.2 8.9   Liver Function Tests: Recent Labs  Lab 07/11/23 1008 07/16/23 1522  AST 35 59*  ALT 42* 71*  ALKPHOS 88 112  BILITOT 0.9 0.8  PROT 6.4 7.8  ALBUMIN 3.9 3.3*   Recent Labs  Lab 07/16/23 1522  LIPASE 22   No results for input(s): AMMONIA in the last 168 hours. CBC: Recent Labs  Lab 07/11/23 1008 07/16/23 1522  WBC 9.1 17.2*  NEUTROABS 7.0 13.5*  HGB 14.0 12.9  HCT 43.5 40.3  MCV 86.5 85.0  PLT 335.0 409*   Cardiac Enzymes: Recent Labs  Lab 07/16/23 1522  TROPONINIHS 5    BNP (last 3 results) No results for input(s): PROBNP in the last 8760 hours. CBG: Recent Labs  Lab 07/17/23 1422  GLUCAP 128*    Radiological Exams on Admission:  US  Abdomen Limited RUQ (LIVER/GB) Result Date: 07/16/2023 CLINICAL DATA:  Right upper quadrant pain. Abnormal gallbladder on CT. EXAM: ULTRASOUND ABDOMEN LIMITED RIGHT UPPER QUADRANT COMPARISON:  Ultrasound earlier today FINDINGS: Gallbladder: Gallstones within the gallbladder measuring up to 1.6 cm. There is sludge layering in the gallbladder. Wall thickening of the 6 mm. Patient was tender over the gallbladder during the  study. Pericholecystic fluid noted. Common bile duct: Diameter: Not visualized Liver: Increased echotexture compatible with fatty infiltration. No focal abnormality or biliary ductal dilatation. Portal  vein is patent on color Doppler imaging with normal direction of blood flow towards the liver. Other: None. IMPRESSION: Cholelithiasis. Sludge, gallbladder wall thickening and pericholecystic with sonographic Murphy sign. Findings compatible with acute cholecystitis. Fatty liver. Electronically Signed   By: Franky Crease M.D.   On: 07/16/2023 21:10   CT ABDOMEN PELVIS WO CONTRAST Result Date: 07/16/2023 CLINICAL DATA:  Upper abdominal and back pain EXAM: CT ABDOMEN AND PELVIS WITHOUT CONTRAST TECHNIQUE: Multidetector CT imaging of the abdomen and pelvis was performed following the standard protocol without IV contrast. RADIATION DOSE REDUCTION: This exam was performed according to the departmental dose-optimization program which includes automated exposure control, adjustment of the mA and/or kV according to patient size and/or use of iterative reconstruction technique. COMPARISON:  Overlapping portions CT chest 07/16/2023 FINDINGS: Lower chest: Unremarkable Hepatobiliary: Substantial abnormal wall thickening in the gallbladder with associated pericholecystic stranding. Several gallstones measure up to 1 cm in diameter. There is also a go gallstone measuring 1 cm in the neck of the gallbladder. No CBD dilatation is observed. Appearance raises suspicion for acute cholecystitis. Pancreas: Unremarkable Spleen: Unremarkable Adrenals/Urinary Tract: Both adrenal glands appear normal. Excreted contrast observed in the collecting systems, ureters, and urinary bladder. No significant abnormality observed. Stomach/Bowel: Periampullary duodenal diverticulum. There are diverticula in the fourth portion of the duodenum without signs of inflammation. Normal appendix. There are few scattered sigmoid colon diverticula. Vascular/Lymphatic: Atherosclerosis is present, including aortoiliac atherosclerotic disease. Reproductive: Calcified lesion favoring fibroid along the left uterine body. Masslike central uterine lesion probably  represents leiomyoma given the similar appearance on pelvic ultrasound of 01/27/2021. Other: No supplemental non-categorized findings. Musculoskeletal: Mild grade 1 degenerative anterolisthesis at L4-5. IMPRESSION: 1. Substantial abnormal wall thickening in the gallbladder with associated pericholecystic stranding and cholelithiasis. Appearance suspicious for acute cholecystitis. 2. Periampullary and distal duodenal diverticula without diverticular inflammation. 3. Sigmoid colon diverticulosis. 4. Mild grade 1 degenerative anterolisthesis at L4-5. 5. Large central uterine leiomyoma as reported on a prior pelvic ultrasound. 6. Aortic atherosclerosis. Aortic Atherosclerosis (ICD10-I70.0). Electronically Signed   By: Ryan Salvage M.D.   On: 07/16/2023 19:29   CT T-SPINE NO CHARGE Result Date: 07/16/2023 CLINICAL DATA:  Back pain EXAM: CT THORACIC SPINE WITHOUT CONTRAST TECHNIQUE: Multidetector CT images of the thoracic were obtained using the standard protocol without intravenous contrast. RADIATION DOSE REDUCTION: This exam was performed according to the departmental dose-optimization program which includes automated exposure control, adjustment of the mA and/or kV according to patient size and/or use of iterative reconstruction technique. COMPARISON:  None Available. FINDINGS: Alignment: Dextroscoliosis. Sagittal alignment within normal limits. No subluxation Vertebrae: No acute fracture or focal pathologic process. Paraspinal and other soft tissues: Negative. Disc levels: Multilevel degenerative osteophytes. No abnormal disc space narrowing or widening. IMPRESSION: Dextroscoliosis with mild degenerative change. No acute osseous abnormality. Electronically Signed   By: Luke Bun M.D.   On: 07/16/2023 18:54   CT Angio Chest PE W and/or Wo Contrast Result Date: 07/16/2023 CLINICAL DATA:  Pain and tenderness across the middle of the back for the past week, worse with a deep breath. History of shingles.  Clinical concern pulmonary embolism. EXAM: CT ANGIOGRAPHY CHEST WITH CONTRAST TECHNIQUE: Multidetector CT imaging of the chest was performed using the standard protocol during bolus administration of intravenous contrast. Multiplanar CT image reconstructions and MIPs were obtained to evaluate the vascular anatomy.  RADIATION DOSE REDUCTION: This exam was performed according to the departmental dose-optimization program which includes automated exposure control, adjustment of the mA and/or kV according to patient size and/or use of iterative reconstruction technique. CONTRAST:  OMNIPAQUE  IOHEXOL  350 MG/ML SOLN COMPARISON:  Chest and bilateral rib radiographs dated 07/11/2023. FINDINGS: Cardiovascular: Mild atheromatous aortic calcifications. Normally opacified pulmonary arteries with no pulmonary arterial filling defects seen. Borderline enlarged heart. No pericardial effusion. Mediastinum/Nodes: No enlarged mediastinal, hilar, or axillary lymph nodes. Thyroid  gland, trachea, and esophagus demonstrate no significant findings. Lungs/Pleura: Lungs are clear. No pleural effusion or pneumothorax. Upper Abdomen: Mild diffuse low-density of the liver. Musculoskeletal: Thoracic and lower cervical spine degenerative changes and mild scoliosis. Review of the MIP images confirms the above findings. IMPRESSION: 1. No pulmonary embolism or other acute abnormality. 2. Borderline cardiomegaly. 3. Mild hepatic steatosis. 4. Aortic atherosclerosis. Aortic Atherosclerosis (ICD10-I70.0). Electronically Signed   By: Elspeth Bathe M.D.   On: 07/16/2023 18:20       Assessment and Plan: * Acute cholecystitis Abdominal pain ongoing for 4 days.  Confirmed by ultrasound, unfortunately common bile duct is not visualized and there is some elevation in ALT/AST.  Therefore we will get an MRI abdomen t/r/o CBD stone..  Continue with Zosyn  as ordered.  I have ordered pain medication including standing acetaminophen  as needed oxycodone   and Dilaudid .  Patient tolerated morphine  pretty well in the ER.  Appreciate srugical input. Look forward to IR in AM  AKI (acute kidney injury) [facility]) Attending yesterday 1.16 in the setting of baseline of 1.03.  Likely due to acute cholecystitis.  Hold losartan .  S/p fluid administration in the ER. At this time, hear fine crackels in lung posteriroly bilaterally. Will hold off on further fluids.  I/O last 3 completed shifts: In: 31 [IV Piggyback:50] Out: -  No intake/output data recorded.    Hypertension Hold solartan given aki  CPAP (continuous positive airway pressure) dependence Resp therapy eval requested.   Patient has chronic shingle pain - cw. Gabapentin  Hypothyroid - c.w. synthroid . Duoneb ordered prn. Empirically     Advance Care Planning:   Code Status: Prior full code.  Consults: surgery engaged, thank you. Anticipate OR in AM  Family Communication: per pateint.  Severity of Illness: The appropriate patient status for this patient is INPATIENT. Inpatient status is judged to be reasonable and necessary in order to provide the required intensity of service to ensure the patient's safety. The patient's presenting symptoms, physical exam findings, and initial radiographic and laboratory data in the context of their chronic comorbidities is felt to place them at high risk for further clinical deterioration. Furthermore, it is not anticipated that the patient will be medically stable for discharge from the hospital within 2 midnights of admission.   * I certify that at the point of admission it is my clinical judgment that the patient will require inpatient hospital care spanning beyond 2 midnights from the point of admission due to high intensity of service, high risk for further deterioration and high frequency of surveillance required.*  Author: Jacqulyn Divine, MD 07/17/2023 6:55 PM  For on call review [email or web address].

## 2023-07-17 NOTE — Assessment & Plan Note (Addendum)
 Abdominal pain ongoing for 4 days.  Confirmed by ultrasound, unfortunately common bile duct is not visualized and there is some elevation in ALT/AST.  Therefore we will get an MRI abdomen t/r/o CBD stone..  Continue with Zosyn  as ordered.  I have ordered pain medication including standing acetaminophen  as needed oxycodone  and Dilaudid .  Patient tolerated morphine  pretty well in the ER.  Appreciate srugical input. Look forward to IR in AM

## 2023-07-17 NOTE — Anesthesia Preprocedure Evaluation (Addendum)
 Anesthesia Evaluation  Patient identified by MRN, date of birth, ID band Patient awake    Reviewed: Allergy & Precautions, H&P , NPO status , Patient's Chart, lab work & pertinent test results  Airway Mallampati: III  TM Distance: >3 FB Neck ROM: Full    Dental no notable dental hx. (+) Partial Lower, Partial Upper, Dental Advisory Given   Pulmonary sleep apnea , former smoker   Pulmonary exam normal breath sounds clear to auscultation       Cardiovascular hypertension, Pt. on medications  Rhythm:Regular Rate:Normal     Neuro/Psych  Headaches  negative psych ROS   GI/Hepatic Neg liver ROS,GERD  Medicated,,  Endo/Other  diabetes, Type 2, Oral Hypoglycemic Agents  Class 3 obesity  Renal/GU negative Renal ROS  negative genitourinary   Musculoskeletal  (+) Arthritis , Osteoarthritis,    Abdominal   Peds  Hematology negative hematology ROS (+)   Anesthesia Other Findings   Reproductive/Obstetrics negative OB ROS                              Anesthesia Physical Anesthesia Plan  ASA: 3  Anesthesia Plan: General   Post-op Pain Management:    Induction: Intravenous  PONV Risk Score and Plan: 4 or greater and Ondansetron, Dexamethasone and Treatment may vary due to age or medical condition  Airway Management Planned: Oral ETT  Additional Equipment:   Intra-op Plan:   Post-operative Plan: Extubation in OR  Informed Consent: I have reviewed the patients History and Physical, chart, labs and discussed the procedure including the risks, benefits and alternatives for the proposed anesthesia with the patient or authorized representative who has indicated his/her understanding and acceptance.     Dental advisory given  Plan Discussed with: CRNA and Anesthesiologist  Anesthesia Plan Comments:         Anesthesia Quick Evaluation

## 2023-07-17 NOTE — Progress Notes (Signed)
 Pt's surgery postponed until tomorrow 07/18/23, per Dr. Hillery Hunter. Report given to [place], RN 6N 23. Pt is AAOx4, and in no apparent distress. Per [place], the room is waiting to be disinfected. She will call with an update to bring the pt to the floor.

## 2023-07-17 NOTE — ED Provider Notes (Signed)
 Emergency Medicine Observation Re-evaluation Note  Tricia Dodson is a 73 y.o. female, seen on rounds today.  Pt initially presented to the ED for complaints of Back Pain Currently, the patient is awake and alert.  Pt has been waiting since yesterday to go to same day surgery at [facility] this am for acute cholecystitis.  Unfortunately, the OR schedule is so busy, that the surgeons don't know if they will get to her today.  So, they are requesting hospitalist admission.  Pt continues to have pain in her RUQ.   Physical Exam  BP 127/72   Pulse 85   Temp 99.2 F (37.3 C) (Oral)   Resp 16   Ht 5' 7 (1.702 m)   Wt 115 kg   SpO2 95%   BMI 39.71 kg/m  Physical Exam General: awake and alert Cardiac: rr Lungs: clear Psych: calm  ED Course / MDM  EKG:EKG Interpretation Date/Time:  Tuesday July 16 2023 15:32:31 EST Ventricular Rate:  81 PR Interval:  188 QRS Duration:  94 QT Interval:  412 QTC Calculation: 478 R Axis:   -29  Text Interpretation: Normal sinus rhythm Confirmed by Ruthe Cornet [phone]) on 07/16/2023 3:36:18 PM  I have reviewed the labs performed to date as well as medications administered while in observation.  Recent changes in the last 24 hours include waiting to go to OR.  Plan  Current plan is for admission.  Surgeons request continued NPO.  IV maintenance fluids and continued abx/pain meds ordered.  Pt d/w Dr. Zella (triad) for admission.    Dean Clarity, MD 07/17/23 [phone]

## 2023-07-17 NOTE — ED Notes (Signed)
 [facility] called and enroute to transport pt to [facility] for cholecystectomy at 2pm. Called [facility] short stay to verify surgery time as we were not informed. Nurse confirmed sx at 2 pm

## 2023-07-17 NOTE — ED Notes (Addendum)
 [facility] rescheduled transport to dispatch @ 10.15am to be at [facility] short stay @ noon.

## 2023-07-17 NOTE — Assessment & Plan Note (Signed)
 Attending yesterday 1.16 in the setting of baseline of 1.03.  Likely due to acute cholecystitis.  Hold losartan .  S/p fluid administration in the ER. At this time, hear fine crackels in lung posteriroly bilaterally. Will hold off on further fluids.  I/O last 3 completed shifts: In: 75 [IV Piggyback:50] Out: -  No intake/output data recorded.

## 2023-07-17 NOTE — Plan of Care (Signed)
  Problem: Pain Management: Goal: General experience of comfort will improve Outcome: Progressing   Problem: Safety: Goal: Ability to remain free from injury will improve Outcome: Progressing

## 2023-07-17 NOTE — ED Notes (Signed)
 Pt requested at home medications, meds reconciled by primary nurse. EDP notified

## 2023-07-17 NOTE — Assessment & Plan Note (Signed)
 Hold solartan given [employer]

## 2023-07-17 NOTE — Consult Note (Signed)
 Consult Note  Tricia Dodson 05/24/1951  [id number].    Requesting: Darryle Birmingham, PA-C Chief Complaint/Reason for Consult: Acute cholecystitis  HPI:  Patient is a 73 year old female who presented to the ED at [facility] yesterday with RUQ abdominal pain. She initially saw her PCP for back pain 12/26 and then was sent to ED when D-dimer was elevated. She denied back or chest pain on arrival to the ED but reported RUQ pain and SOB. She was having lower abdominal pain the week prior to presentation but noted RUQ pain over the last few days. Denies nausea, vomiting, fever, chills, urinary symptoms. PMH otherwise significant for HTN, HLD, OSA on CPAP, obesity, hypothyroidism, GERD and arthritis. No prior abdominal surgery. Not on any blood thinners at baseline. NKDA.   ROS: Negative other than HPI  Family History  Problem Relation Age of Onset   Arthritis Mother    Diabetes Father    Heart disease Father    Arthritis Sister    Diabetes Sister    Hyperlipidemia Sister    Hyperlipidemia Brother    Heart disease Brother        triple heart bypass   Sleep apnea Brother     Past Medical History:  Diagnosis Date   Allergy    Arthritis    Carpal tunnel syndrome, bilateral 11/04/2020   Both wrists.    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   Shingles 2019   Thyroid  disease    Torn ligament 11/04/2020   Right knee   UTI (urinary tract infection)     Past Surgical History:  Procedure Laterality Date   BILATERAL CARPAL TUNNEL RELEASE     Hemorroids removed     KNEE ARTHROSCOPY Right    Plantar fasciatis       Repaired   right hand cyst removal     vein vascular     surgery    Social History:  reports that she has quit smoking. She has never used smokeless tobacco. She reports current alcohol use. No history on file for drug use.  Allergies: No Known Allergies  (Not in a hospital admission)   Blood pressure 127/72, pulse 85, temperature 99.2 F (37.3 C), temperature source Oral, resp. rate 16, height 5' 7 (1.702 m), weight 115 kg, SpO2 95%. Physical Exam:  General: pleasant, WD, obese female who is laying in bed in NAD HEENT: head is normocephalic, atraumatic.  EOMI, sclera anicteric Heart: regular, rate, and rhythm. Lungs: CTAB, no wheezes, rhonchi, or rales noted.  Respiratory effort nonlabored Abd: soft, ttp in epigastrium and RUQ, ND, no masses, hernias, or organomegaly MS: all 4 extremities are symmetrical with no cyanosis, clubbing, or edema. Skin: warm and dry with no masses, lesions, or rashes Neuro: Cranial nerves 2-12 grossly intact, sensation is normal throughout Psych: A&Ox3 with an appropriate affect.   Results for orders placed or performed during the hospital encounter of 07/16/23 (from the past 48 hours)  CBC with Differential     Status: Abnormal   Collection Time: 07/16/23  3:22 PM  Result Value Ref Range   WBC 17.2 (H) 4.0 - 10.5 K/uL   RBC 4.74 3.87 - 5.11 MIL/uL   Hemoglobin 12.9 12.0 - 15.0 g/dL   HCT 59.6 63.9 - 53.9 %   MCV 85.0 80.0 - 100.0 fL   MCH 27.2 26.0 - 34.0 pg   MCHC 32.0 30.0 - 36.0 g/dL   RDW 86.9 88.4 - 84.4 %   Platelets 409 (H) 150 - 400 K/uL   nRBC 0.0 0.0 - 0.2 %   Neutrophils Relative % 77 %   Neutro Abs 13.5 (H) 1.7 - 7.7 K/uL   Lymphocytes Relative 12 %   Lymphs Abs 2.0 0.7 - 4.0 K/uL   Monocytes Relative 7 %   Monocytes Absolute 1.2 (H) 0.1 - 1.0 K/uL   Eosinophils Relative 2 %   Eosinophils Absolute 0.3 0.0 - 0.5 K/uL   Basophils Relative 1 %   Basophils Absolute 0.1 0.0 - 0.1 K/uL   Immature Granulocytes 1 %   Abs  Immature Granulocytes 0.16 (H) 0.00 - 0.07 K/uL    Comment: Performed at [facility], [address] [place], [place] [address]  Comprehensive metabolic panel     Status: Abnormal   Collection Time: 07/16/23  3:22 PM  Result Value Ref Range   Sodium 137 135 - 145 mmol/L   Potassium 4.2 3.5 - 5.1 mmol/L   Chloride 104 98 - 111 mmol/L   CO2 23 22 - 32 mmol/L   Glucose, Bld 121 (H) 70 - 99 mg/dL    Comment: Glucose reference range applies only to samples taken after fasting for at least 8 hours.   BUN 13 8 - 23 mg/dL   Creatinine, Ser 8.83 (H) 0.44 - 1.00 mg/dL   Calcium  8.9 8.9 - 10.3 mg/dL   Total Protein 7.8 6.5 - 8.1 g/dL   Albumin 3.3 (L) 3.5 - 5.0 g/dL   AST 59 (H) 15 - 41 U/L   ALT 71 (H) 0 - 44 U/L   Alkaline Phosphatase 112 38 - 126 U/L   Total Bilirubin 0.8 0.0 - 1.2 mg/dL   GFR, Estimated 50 (L) >60 mL/min    Comment: (NOTE) Calculated using the CKD-EPI Creatinine Equation (2021)    Anion gap 10 5 - 15    Comment: Performed at [facility], [address] [place], [place] [address]  Lipase, blood     Status: None   Collection Time: 07/16/23  3:22 PM  Result Value Ref Range   Lipase 22 11 - 51 U/L    Comment: Performed at [facility], [address] [place], [place] [address]  Troponin I (High Sensitivity)     Status: None   Collection Time: 07/16/23  3:22 PM  Result Value Ref Range   Troponin I (High Sensitivity) 5 <18 ng/L    Comment: (NOTE) Elevated high sensitivity troponin I (hsTnI) values and significant  changes across serial measurements may suggest ACS but many other  chronic and acute conditions are known to elevate hsTnI results.  Refer to the Links section for chest pain algorithms and additional  guidance. Performed at [facility], [address] [place], [place] [address]   Blood culture (routine x 2)     Status: None (Preliminary result)   Collection Time: 07/16/23  8:14 PM   Specimen: Right  Antecubital; Blood  Result Value Ref Range   Specimen Description  RIGHT ANTECUBITAL Performed at [facility], [address] [place], [place] [address]    Special Requests      BOTTLES DRAWN AEROBIC AND ANAEROBIC Blood Culture adequate volume Performed at [facility], [address] [place], [place] [address]    Culture      NO GROWTH < 12 HOURS Performed at [facility], [address] [place], [place] [address]    Report Status PENDING   Blood culture (routine x 2)     Status: None (Preliminary result)   Collection Time: 07/16/23  8:14 PM   Specimen: Left Antecubital; Blood  Result Value Ref Range   Specimen Description      LEFT ANTECUBITAL Performed at [facility], [address] [place], [place] [address]    Special Requests      BOTTLES DRAWN AEROBIC AND ANAEROBIC Blood Culture adequate volume Performed at [facility], [address] [place], [place] [address]    Culture      NO GROWTH < 12 HOURS Performed at [facility], [address] [place], [place] [address]    Report Status PENDING   Lactic acid, plasma     Status: None   Collection Time: 07/16/23  8:15 PM  Result Value Ref Range   Lactic Acid, Venous 1.4 0.5 - 1.9 mmol/L    Comment: Performed at [facility], [address] [place], [place] [address]   US  Abdomen Limited RUQ (LIVER/GB) Result Date: 07/16/2023 CLINICAL DATA:  Right upper quadrant pain. Abnormal gallbladder on CT. EXAM: ULTRASOUND ABDOMEN LIMITED RIGHT UPPER QUADRANT COMPARISON:  Ultrasound earlier today FINDINGS: Gallbladder: Gallstones within the gallbladder measuring up to 1.6 cm. There is sludge layering in the gallbladder. Wall thickening of the 6 mm. Patient was tender over the gallbladder during the study. Pericholecystic fluid noted. Common bile duct: Diameter: Not visualized Liver: Increased echotexture compatible with fatty infiltration. No focal abnormality  or biliary ductal dilatation. Portal vein is patent on color Doppler imaging with normal direction of blood flow towards the liver. Other: None. IMPRESSION: Cholelithiasis. Sludge, gallbladder wall thickening and pericholecystic with sonographic Murphy sign. Findings compatible with acute cholecystitis. Fatty liver. Electronically Signed   By: Franky Crease M.D.   On: 07/16/2023 21:10   CT ABDOMEN PELVIS WO CONTRAST Result Date: 07/16/2023 CLINICAL DATA:  Upper abdominal and back pain EXAM: CT ABDOMEN AND PELVIS WITHOUT CONTRAST TECHNIQUE: Multidetector CT imaging of the abdomen and pelvis was performed following the standard protocol without IV contrast. RADIATION DOSE REDUCTION: This exam was performed according to the departmental dose-optimization program which includes automated exposure control, adjustment of the mA and/or kV according to patient size and/or use of iterative reconstruction technique. COMPARISON:  Overlapping portions CT chest 07/16/2023 FINDINGS: Lower chest: Unremarkable Hepatobiliary: Substantial abnormal wall thickening in the gallbladder with associated pericholecystic stranding. Several gallstones measure up to 1 cm in diameter. There is also a go gallstone measuring 1 cm in the neck of the gallbladder. No CBD dilatation is observed. Appearance raises suspicion for acute cholecystitis. Pancreas: Unremarkable Spleen: Unremarkable Adrenals/Urinary Tract: Both adrenal glands appear normal. Excreted contrast observed in the collecting systems, ureters, and urinary bladder. No significant abnormality observed. Stomach/Bowel: Periampullary duodenal diverticulum. There are diverticula in the fourth portion of the duodenum without signs of inflammation. Normal appendix. There are few scattered sigmoid colon diverticula. Vascular/Lymphatic: Atherosclerosis is present, including aortoiliac atherosclerotic disease. Reproductive: Calcified lesion  favoring fibroid along the left uterine body.  Masslike central uterine lesion probably represents leiomyoma given the similar appearance on pelvic ultrasound of 01/27/2021. Other: No supplemental non-categorized findings. Musculoskeletal: Mild grade 1 degenerative anterolisthesis at L4-5. IMPRESSION: 1. Substantial abnormal wall thickening in the gallbladder with associated pericholecystic stranding and cholelithiasis. Appearance suspicious for acute cholecystitis. 2. Periampullary and distal duodenal diverticula without diverticular inflammation. 3. Sigmoid colon diverticulosis. 4. Mild grade 1 degenerative anterolisthesis at L4-5. 5. Large central uterine leiomyoma as reported on a prior pelvic ultrasound. 6. Aortic atherosclerosis. Aortic Atherosclerosis (ICD10-I70.0). Electronically Signed   By: Ryan Salvage M.D.   On: 07/16/2023 19:29   CT T-SPINE NO CHARGE Result Date: 07/16/2023 CLINICAL DATA:  Back pain EXAM: CT THORACIC SPINE WITHOUT CONTRAST TECHNIQUE: Multidetector CT images of the thoracic were obtained using the standard protocol without intravenous contrast. RADIATION DOSE REDUCTION: This exam was performed according to the departmental dose-optimization program which includes automated exposure control, adjustment of the mA and/or kV according to patient size and/or use of iterative reconstruction technique. COMPARISON:  None Available. FINDINGS: Alignment: Dextroscoliosis. Sagittal alignment within normal limits. No subluxation Vertebrae: No acute fracture or focal pathologic process. Paraspinal and other soft tissues: Negative. Disc levels: Multilevel degenerative osteophytes. No abnormal disc space narrowing or widening. IMPRESSION: Dextroscoliosis with mild degenerative change. No acute osseous abnormality. Electronically Signed   By: Luke Bun M.D.   On: 07/16/2023 18:54   CT Angio Chest PE W and/or Wo Contrast Result Date: 07/16/2023 CLINICAL DATA:  Pain and tenderness across the middle of the back for the past week, worse  with a deep breath. History of shingles. Clinical concern pulmonary embolism. EXAM: CT ANGIOGRAPHY CHEST WITH CONTRAST TECHNIQUE: Multidetector CT imaging of the chest was performed using the standard protocol during bolus administration of intravenous contrast. Multiplanar CT image reconstructions and MIPs were obtained to evaluate the vascular anatomy. RADIATION DOSE REDUCTION: This exam was performed according to the departmental dose-optimization program which includes automated exposure control, adjustment of the mA and/or kV according to patient size and/or use of iterative reconstruction technique. CONTRAST:  OMNIPAQUE  IOHEXOL  350 MG/ML SOLN COMPARISON:  Chest and bilateral rib radiographs dated 07/11/2023. FINDINGS: Cardiovascular: Mild atheromatous aortic calcifications. Normally opacified pulmonary arteries with no pulmonary arterial filling defects seen. Borderline enlarged heart. No pericardial effusion. Mediastinum/Nodes: No enlarged mediastinal, hilar, or axillary lymph nodes. Thyroid  gland, trachea, and esophagus demonstrate no significant findings. Lungs/Pleura: Lungs are clear. No pleural effusion or pneumothorax. Upper Abdomen: Mild diffuse low-density of the liver. Musculoskeletal: Thoracic and lower cervical spine degenerative changes and mild scoliosis. Review of the MIP images confirms the above findings. IMPRESSION: 1. No pulmonary embolism or other acute abnormality. 2. Borderline cardiomegaly. 3. Mild hepatic steatosis. 4. Aortic atherosclerosis. Aortic Atherosclerosis (ICD10-I70.0). Electronically Signed   By: Elspeth Bathe M.D.   On: 07/16/2023 18:20      Assessment/Plan Acute cholecystitis  - US  with cholelithiasis and findings suggestive of acute cholecystitis  - CT with above and non-inflamed periampullary and distal duodenal diverticula - WBC 17K, afebrile, HD stable  - AST/ALT mildly elevated, Tbili and Alk phos WNL - exam consistent with acute cholecystitis  - I have  explained the procedure, risks, and aftercare of Laparoscopic cholecystectomy.  Risks include but are not limited to anesthesia (MI, CVA, death, prolonged intubation and aspiration), bleeding, infection, wound problems, hernia, bile leak, injury to common bile duct/liver/intestine, possible need for subtotal cholecystectomy or open cholecystectomy, increased risk of DVT/PE and diarrhea post op.  She seems to understand and agrees to proceed.   FEN: NPO for OR VTE: SCDs ID: [email or web address]   - per TRH -  HTN HLD Hypothyroidism GERD Obesity class II OSA on CPAP Arthritis   I reviewed ED provider notes, last 24 h vitals and pain scores, last 48 h intake and output, last 24 h labs and trends, and last 24 h imaging results.  This care required high  level of medical decision making.   Burnard JONELLE Louder, [facility] Surgery 07/17/2023, 10:28 AM Please see Amion for pager number during day hours 7:00am-4:30pm

## 2023-07-17 NOTE — Assessment & Plan Note (Signed)
 Resp therapy eval requested.

## 2023-07-18 ENCOUNTER — Ambulatory Visit (HOSPITAL_BASED_OUTPATIENT_CLINIC_OR_DEPARTMENT_OTHER): Admission: RE | Admit: 2023-07-18 | Payer: Medicare Other | Source: Ambulatory Visit

## 2023-07-18 ENCOUNTER — Other Ambulatory Visit: Payer: Self-pay

## 2023-07-18 ENCOUNTER — Observation Stay (HOSPITAL_COMMUNITY): Payer: Medicare Other | Admitting: Certified Registered Nurse Anesthetist

## 2023-07-18 ENCOUNTER — Encounter (HOSPITAL_COMMUNITY): Payer: Self-pay | Admitting: Internal Medicine

## 2023-07-18 ENCOUNTER — Encounter (HOSPITAL_COMMUNITY)
Admission: EM | Disposition: A | Discharge status: Home Health | Payer: Self-pay | Source: Home / Self Care | Attending: Internal Medicine

## 2023-07-18 ENCOUNTER — Observation Stay (HOSPITAL_COMMUNITY): Payer: Medicare Other

## 2023-07-18 DIAGNOSIS — M1990 Unspecified osteoarthritis, unspecified site: Secondary | ICD-10-CM | POA: Diagnosis present

## 2023-07-18 DIAGNOSIS — R197 Diarrhea, unspecified: Secondary | ICD-10-CM | POA: Diagnosis not present

## 2023-07-18 DIAGNOSIS — N179 Acute kidney failure, unspecified: Secondary | ICD-10-CM | POA: Diagnosis present

## 2023-07-18 DIAGNOSIS — K219 Gastro-esophageal reflux disease without esophagitis: Secondary | ICD-10-CM | POA: Diagnosis present

## 2023-07-18 DIAGNOSIS — E039 Hypothyroidism, unspecified: Secondary | ICD-10-CM | POA: Diagnosis present

## 2023-07-18 DIAGNOSIS — K838 Other specified diseases of biliary tract: Secondary | ICD-10-CM | POA: Diagnosis not present

## 2023-07-18 DIAGNOSIS — E119 Type 2 diabetes mellitus without complications: Secondary | ICD-10-CM | POA: Diagnosis not present

## 2023-07-18 DIAGNOSIS — K2990 Gastroduodenitis, unspecified, without bleeding: Secondary | ICD-10-CM | POA: Diagnosis present

## 2023-07-18 DIAGNOSIS — E870 Hyperosmolality and hypernatremia: Secondary | ICD-10-CM | POA: Diagnosis not present

## 2023-07-18 DIAGNOSIS — I358 Other nonrheumatic aortic valve disorders: Secondary | ICD-10-CM | POA: Diagnosis present

## 2023-07-18 DIAGNOSIS — R7881 Bacteremia: Secondary | ICD-10-CM | POA: Diagnosis not present

## 2023-07-18 DIAGNOSIS — Z4682 Encounter for fitting and adjustment of non-vascular catheter: Secondary | ICD-10-CM | POA: Diagnosis not present

## 2023-07-18 DIAGNOSIS — Z87891 Personal history of nicotine dependence: Secondary | ICD-10-CM | POA: Diagnosis not present

## 2023-07-18 DIAGNOSIS — I340 Nonrheumatic mitral (valve) insufficiency: Secondary | ICD-10-CM | POA: Diagnosis present

## 2023-07-18 DIAGNOSIS — Z0389 Encounter for observation for other suspected diseases and conditions ruled out: Secondary | ICD-10-CM | POA: Diagnosis not present

## 2023-07-18 DIAGNOSIS — E785 Hyperlipidemia, unspecified: Secondary | ICD-10-CM | POA: Diagnosis present

## 2023-07-18 DIAGNOSIS — Z79899 Other long term (current) drug therapy: Secondary | ICD-10-CM | POA: Diagnosis not present

## 2023-07-18 DIAGNOSIS — Z79890 Hormone replacement therapy (postmenopausal): Secondary | ICD-10-CM | POA: Diagnosis not present

## 2023-07-18 DIAGNOSIS — K810 Acute cholecystitis: Secondary | ICD-10-CM | POA: Diagnosis not present

## 2023-07-18 DIAGNOSIS — K8000 Calculus of gallbladder with acute cholecystitis without obstruction: Secondary | ICD-10-CM | POA: Diagnosis present

## 2023-07-18 DIAGNOSIS — Z7984 Long term (current) use of oral hypoglycemic drugs: Secondary | ICD-10-CM | POA: Diagnosis not present

## 2023-07-18 DIAGNOSIS — K832 Perforation of bile duct: Secondary | ICD-10-CM | POA: Diagnosis not present

## 2023-07-18 DIAGNOSIS — K658 Other peritonitis: Secondary | ICD-10-CM | POA: Diagnosis not present

## 2023-07-18 DIAGNOSIS — Y838 Other surgical procedures as the cause of abnormal reaction of the patient, or of later complication, without mention of misadventure at the time of the procedure: Secondary | ICD-10-CM | POA: Diagnosis not present

## 2023-07-18 DIAGNOSIS — G4730 Sleep apnea, unspecified: Secondary | ICD-10-CM | POA: Diagnosis not present

## 2023-07-18 DIAGNOSIS — K839 Disease of biliary tract, unspecified: Secondary | ICD-10-CM | POA: Diagnosis not present

## 2023-07-18 DIAGNOSIS — K82A1 Gangrene of gallbladder in cholecystitis: Secondary | ICD-10-CM | POA: Diagnosis present

## 2023-07-18 DIAGNOSIS — K8689 Other specified diseases of pancreas: Secondary | ICD-10-CM | POA: Diagnosis not present

## 2023-07-18 DIAGNOSIS — K5900 Constipation, unspecified: Secondary | ICD-10-CM | POA: Diagnosis present

## 2023-07-18 DIAGNOSIS — E66812 Obesity, class 2: Secondary | ICD-10-CM | POA: Diagnosis present

## 2023-07-18 DIAGNOSIS — G4733 Obstructive sleep apnea (adult) (pediatric): Secondary | ICD-10-CM | POA: Diagnosis present

## 2023-07-18 DIAGNOSIS — K2970 Gastritis, unspecified, without bleeding: Secondary | ICD-10-CM | POA: Diagnosis present

## 2023-07-18 DIAGNOSIS — K8010 Calculus of gallbladder with chronic cholecystitis without obstruction: Secondary | ICD-10-CM | POA: Diagnosis not present

## 2023-07-18 DIAGNOSIS — E876 Hypokalemia: Secondary | ICD-10-CM | POA: Diagnosis not present

## 2023-07-18 DIAGNOSIS — R918 Other nonspecific abnormal finding of lung field: Secondary | ICD-10-CM | POA: Diagnosis not present

## 2023-07-18 DIAGNOSIS — K812 Acute cholecystitis with chronic cholecystitis: Secondary | ICD-10-CM | POA: Diagnosis not present

## 2023-07-18 DIAGNOSIS — K5710 Diverticulosis of small intestine without perforation or abscess without bleeding: Secondary | ICD-10-CM | POA: Diagnosis not present

## 2023-07-18 DIAGNOSIS — K9189 Other postprocedural complications and disorders of digestive system: Secondary | ICD-10-CM | POA: Diagnosis not present

## 2023-07-18 DIAGNOSIS — R109 Unspecified abdominal pain: Secondary | ICD-10-CM | POA: Diagnosis not present

## 2023-07-18 DIAGNOSIS — K3189 Other diseases of stomach and duodenum: Secondary | ICD-10-CM | POA: Diagnosis not present

## 2023-07-18 DIAGNOSIS — K828 Other specified diseases of gallbladder: Secondary | ICD-10-CM | POA: Diagnosis not present

## 2023-07-18 DIAGNOSIS — I10 Essential (primary) hypertension: Secondary | ICD-10-CM | POA: Diagnosis not present

## 2023-07-18 DIAGNOSIS — G8929 Other chronic pain: Secondary | ICD-10-CM | POA: Diagnosis present

## 2023-07-18 HISTORY — PX: CHOLECYSTECTOMY: SHX55

## 2023-07-18 LAB — BLOOD CULTURE ID PANEL (REFLEXED) - BCID2

## 2023-07-18 LAB — BASIC METABOLIC PANEL
Anion gap: 12 (ref 5–15)
BUN: 20 mg/dL (ref 8–23)
CO2: 23 mmol/L (ref 22–32)
Calcium: 8.4 mg/dL — ABNORMAL LOW (ref 8.9–10.3)
Chloride: 102 mmol/L (ref 98–111)
Creatinine, Ser: 1.22 mg/dL — ABNORMAL HIGH (ref 0.44–1.00)
GFR, Estimated: 47 mL/min — ABNORMAL LOW (ref >60)
Glucose, Bld: 131 mg/dL — ABNORMAL HIGH (ref 70–99)
Potassium: 3.5 mmol/L (ref 3.5–5.1)
Sodium: 137 mmol/L (ref 135–145)

## 2023-07-18 LAB — CBC
HCT: 36.4 % (ref 36.0–46.0)
Hemoglobin: 11.7 g/dL — ABNORMAL LOW (ref 12.0–15.0)
MCH: 27.2 pg (ref 26.0–34.0)
MCHC: 32.1 g/dL (ref 30.0–36.0)
MCV: 84.7 fL (ref 80.0–100.0)
Platelets: 407 10*3/uL — ABNORMAL HIGH (ref 150–400)
RBC: 4.3 MIL/uL (ref 3.87–5.11)
RDW: 13.2 % (ref 11.5–15.5)
WBC: 13.6 10*3/uL — ABNORMAL HIGH (ref 4.0–10.5)
nRBC: 0 % (ref 0.0–0.2)

## 2023-07-18 LAB — HEPATIC FUNCTION PANEL
ALT: 43 U/L (ref 0–44)
AST: 28 U/L (ref 15–41)
Albumin: 2.2 g/dL — ABNORMAL LOW (ref 3.5–5.0)
Alkaline Phosphatase: 107 U/L (ref 38–126)
Bilirubin, Direct: 0.3 mg/dL — ABNORMAL HIGH (ref 0.0–0.2)
Indirect Bilirubin: 0.7 mg/dL (ref 0.3–0.9)
Total Bilirubin: 1 mg/dL (ref 0.0–1.2)
Total Protein: 6.2 g/dL — ABNORMAL LOW (ref 6.5–8.1)

## 2023-07-18 LAB — SURGICAL PCR SCREEN
MRSA, PCR: NEGATIVE
Staphylococcus aureus: POSITIVE — AB

## 2023-07-18 LAB — GLUCOSE, CAPILLARY
Glucose-Capillary: 125 mg/dL — ABNORMAL HIGH (ref 70–99)
Glucose-Capillary: 127 mg/dL — ABNORMAL HIGH (ref 70–99)

## 2023-07-18 LAB — APTT: aPTT: 41 s — ABNORMAL HIGH (ref 24–36)

## 2023-07-18 LAB — PROTIME-INR
INR: 1.2 (ref 0.8–1.2)
Prothrombin Time: 15.4 s — ABNORMAL HIGH (ref 11.4–15.2)

## 2023-07-18 SURGERY — LAPAROSCOPIC CHOLECYSTECTOMY
Anesthesia: General | Site: Abdomen

## 2023-07-18 MED ORDER — INDOCYANINE GREEN 25 MG IV SOLR
2.5000 mg | Freq: Once | INTRAVENOUS | Status: AC
  Administered 2023-07-18: 2.5 mg via INTRAVENOUS
  Filled 2023-07-18: qty 10

## 2023-07-18 MED ORDER — SODIUM CHLORIDE 0.9 % IV SOLN
INTRAVENOUS | Status: AC
  Filled 2023-07-18: qty 3

## 2023-07-18 MED ORDER — PHENYLEPHRINE 80 MCG/ML (10ML) SYRINGE FOR IV PUSH (FOR BLOOD PRESSURE SUPPORT)
PREFILLED_SYRINGE | INTRAVENOUS | Status: DC | PRN
  Administered 2023-07-18: 160 ug via INTRAVENOUS

## 2023-07-18 MED ORDER — PHENYLEPHRINE HCL-NACL 20-0.9 MG/250ML-% IV SOLN
INTRAVENOUS | Status: DC | PRN
  Administered 2023-07-18: 20 ug/min via INTRAVENOUS

## 2023-07-18 MED ORDER — BUPIVACAINE-EPINEPHRINE (PF) 0.25% -1:200000 IJ SOLN
INTRAMUSCULAR | Status: AC
  Filled 2023-07-18: qty 30

## 2023-07-18 MED ORDER — FENTANYL CITRATE (PF) 100 MCG/2ML IJ SOLN
INTRAMUSCULAR | Status: AC
  Filled 2023-07-18: qty 2

## 2023-07-18 MED ORDER — PHENYLEPHRINE HCL (PRESSORS) 10 MG/ML IV SOLN
INTRAVENOUS | Status: AC
  Filled 2023-07-18: qty 1

## 2023-07-18 MED ORDER — FENTANYL CITRATE (PF) 100 MCG/2ML IJ SOLN
25.0000 ug | INTRAMUSCULAR | Status: DC | PRN
  Administered 2023-07-18: 50 ug via INTRAVENOUS

## 2023-07-18 MED ORDER — LIDOCAINE 2% (20 MG/ML) 5 ML SYRINGE
INTRAMUSCULAR | Status: DC | PRN
  Administered 2023-07-18: 60 mg via INTRAVENOUS

## 2023-07-18 MED ORDER — BUPIVACAINE-EPINEPHRINE (PF) 0.25% -1:200000 IJ SOLN
INTRAMUSCULAR | Status: DC | PRN
  Administered 2023-07-18: 30 mL via PERINEURAL

## 2023-07-18 MED ORDER — OXYCODONE HCL 5 MG/5ML PO SOLN: 5.0000 mg | Freq: Once | ORAL | Status: DC | PRN

## 2023-07-18 MED ORDER — CHLORHEXIDINE GLUCONATE CLOTH 2 % EX PADS
6.0000 | MEDICATED_PAD | Freq: Every day | CUTANEOUS | Status: AC
  Administered 2023-07-18 – 2023-07-22 (×5): 6 via TOPICAL

## 2023-07-18 MED ORDER — DEXAMETHASONE SODIUM PHOSPHATE 10 MG/ML IJ SOLN
INTRAMUSCULAR | Status: DC | PRN
  Administered 2023-07-18: 10 mg via INTRAVENOUS

## 2023-07-18 MED ORDER — PROPOFOL 10 MG/ML IV BOLUS
INTRAVENOUS | Status: AC
  Filled 2023-07-18: qty 20

## 2023-07-18 MED ORDER — 0.9 % SODIUM CHLORIDE (POUR BTL) OPTIME
TOPICAL | Status: DC | PRN
  Administered 2023-07-18: 1000 mL

## 2023-07-18 MED ORDER — FENTANYL CITRATE (PF) 250 MCG/5ML IJ SOLN
INTRAMUSCULAR | Status: AC
  Filled 2023-07-18: qty 5

## 2023-07-18 MED ORDER — FENTANYL CITRATE (PF) 100 MCG/2ML IJ SOLN: 25.0000 ug | INTRAMUSCULAR | Status: DC | PRN

## 2023-07-18 MED ORDER — ROCURONIUM BROMIDE 10 MG/ML (PF) SYRINGE
PREFILLED_SYRINGE | INTRAVENOUS | Status: DC | PRN
  Administered 2023-07-18: 50 mg via INTRAVENOUS
  Administered 2023-07-18: 10 mg via INTRAVENOUS

## 2023-07-18 MED ORDER — SODIUM CHLORIDE 0.9 % IR SOLN
Status: DC | PRN
  Administered 2023-07-18: 1000 mL

## 2023-07-18 MED ORDER — SODIUM CHLORIDE 0.9 % IV SOLN: 3.0000 g | INTRAVENOUS | Status: DC

## 2023-07-18 MED ORDER — FENTANYL CITRATE (PF) 250 MCG/5ML IJ SOLN
INTRAMUSCULAR | Status: DC | PRN
  Administered 2023-07-18: 100 ug via INTRAVENOUS

## 2023-07-18 MED ORDER — ONDANSETRON HCL 4 MG/2ML IJ SOLN
INTRAMUSCULAR | Status: DC | PRN
  Administered 2023-07-18: 4 mg via INTRAVENOUS

## 2023-07-18 MED ORDER — MUPIROCIN 2 % EX OINT
1.0000 | TOPICAL_OINTMENT | Freq: Two times a day (BID) | CUTANEOUS | Status: AC
  Administered 2023-07-18 – 2023-07-23 (×10): 1 via NASAL
  Filled 2023-07-18: qty 22

## 2023-07-18 MED ORDER — PROPOFOL 10 MG/ML IV BOLUS
INTRAVENOUS | Status: DC | PRN
  Administered 2023-07-18: 130 mg via INTRAVENOUS

## 2023-07-18 MED ORDER — ORAL CARE MOUTH RINSE: 15.0000 mL | Freq: Once | OROMUCOSAL | Status: AC

## 2023-07-18 MED ORDER — OXYCODONE HCL 5 MG PO TABS: 5.0000 mg | ORAL_TABLET | Freq: Once | ORAL | Status: DC | PRN

## 2023-07-18 MED ORDER — LACTATED RINGERS IV SOLN: INTRAVENOUS | Status: DC | PRN

## 2023-07-18 MED ORDER — ONDANSETRON HCL 4 MG/2ML IJ SOLN: 4.0000 mg | Freq: Four times a day (QID) | INTRAMUSCULAR | Status: DC | PRN

## 2023-07-18 MED ORDER — CHLORHEXIDINE GLUCONATE 0.12 % MT SOLN
OROMUCOSAL | Status: AC
  Administered 2023-07-18: 15 mL via OROMUCOSAL
  Filled 2023-07-18: qty 15

## 2023-07-18 MED ORDER — SUGAMMADEX SODIUM 200 MG/2ML IV SOLN
INTRAVENOUS | Status: DC | PRN
  Administered 2023-07-18: 200 mg via INTRAVENOUS
  Administered 2023-07-18: 100 mg via INTRAVENOUS

## 2023-07-18 MED ORDER — CHLORHEXIDINE GLUCONATE 0.12 % MT SOLN: 15.0000 mL | Freq: Once | OROMUCOSAL | Status: AC

## 2023-07-18 SURGICAL SUPPLY — 44 items
APPLIER CLIP ROT 10 11.4 M/L (STAPLE) ×1 IMPLANT
BAG COUNTER SPONGE SURGICOUNT (BAG) ×1 IMPLANT
CANISTER SUCT 3000ML PPV (MISCELLANEOUS) ×1 IMPLANT
CHLORAPREP W/TINT 26 (MISCELLANEOUS) ×1 IMPLANT
CLIP APPLIE ROT 10 11.4 M/L (STAPLE) ×1 IMPLANT
COVER SURGICAL LIGHT HANDLE (MISCELLANEOUS) ×1 IMPLANT
DERMABOND ADVANCED .7 DNX12 (GAUZE/BANDAGES/DRESSINGS) ×1 IMPLANT
DRAIN CHANNEL 19F RND (DRAIN) IMPLANT
ELECT REM PT RETURN 9FT ADLT (ELECTROSURGICAL) ×1 IMPLANT
ELECTRODE REM PT RTRN 9FT ADLT (ELECTROSURGICAL) ×1 IMPLANT
ENDOLOOP SUT PDS II 0 18 (SUTURE) IMPLANT
EVACUATOR SILICONE 100CC (DRAIN) IMPLANT
GAUZE SPONGE 4X4 12PLY STRL (GAUZE/BANDAGES/DRESSINGS) IMPLANT
GLOVE BIO SURGEON STRL SZ7 (GLOVE) ×1 IMPLANT
GOWN STRL REUS W/ TWL LRG LVL3 (GOWN DISPOSABLE) ×2 IMPLANT
GOWN STRL REUS W/ TWL XL LVL3 (GOWN DISPOSABLE) ×1 IMPLANT
GRASPER SUT TROCAR 14GX15 (MISCELLANEOUS) ×1 IMPLANT
IRRIG SUCT STRYKERFLOW 2 WTIP (MISCELLANEOUS) ×1 IMPLANT
IRRIGATION SUCT STRKRFLW 2 WTP (MISCELLANEOUS) ×1 IMPLANT
KIT BASIN OR (CUSTOM PROCEDURE TRAY) ×1 IMPLANT
KIT TURNOVER KIT B (KITS) ×1 IMPLANT
L-HOOK LAP DISP 36CM (ELECTROSURGICAL) ×1 IMPLANT
LHOOK LAP DISP 36CM (ELECTROSURGICAL) ×1 IMPLANT
NDL 22X1.5 STRL (OR ONLY) (MISCELLANEOUS) ×1 IMPLANT
NDL INSUFFLATION 14GA 120MM (NEEDLE) ×1 IMPLANT
NEEDLE 22X1.5 STRL (OR ONLY) (MISCELLANEOUS) ×1 IMPLANT
NEEDLE INSUFFLATION 14GA 120MM (NEEDLE) ×1 IMPLANT
NS IRRIG 1000ML POUR BTL (IV SOLUTION) ×1 IMPLANT
PAD ARMBOARD 7.5X6 YLW CONV (MISCELLANEOUS) ×1 IMPLANT
PENCIL BUTTON HOLSTER BLD 10FT (ELECTRODE) ×1 IMPLANT
POUCH RETRIEVAL ECOSAC 10 (ENDOMECHANICALS) ×1 IMPLANT
SCISSORS LAP 5X35 DISP (ENDOMECHANICALS) ×1 IMPLANT
SET TUBE SMOKE EVAC HIGH FLOW (TUBING) ×1 IMPLANT
SLEEVE Z-THREAD 5X100MM (TROCAR) ×2 IMPLANT
SUT ETHILON 2 0 FS 18 (SUTURE) IMPLANT
SUT MNCRL AB 4-0 PS2 18 (SUTURE) ×1 IMPLANT
TAPE CLOTH 4X10 WHT NS (GAUZE/BANDAGES/DRESSINGS) IMPLANT
TOWEL GREEN STERILE (TOWEL DISPOSABLE) ×1 IMPLANT
TOWEL GREEN STERILE FF (TOWEL DISPOSABLE) ×1 IMPLANT
TRAY LAPAROSCOPIC MC (CUSTOM PROCEDURE TRAY) ×1 IMPLANT
TROCAR Z THREAD OPTICAL 12X100 (TROCAR) ×1 IMPLANT
TROCAR Z-THREAD OPTICAL 5X100M (TROCAR) ×1 IMPLANT
WARMER LAPAROSCOPE (MISCELLANEOUS) ×1 IMPLANT
WATER STERILE IRR 1000ML POUR (IV SOLUTION) ×1 IMPLANT

## 2023-07-18 NOTE — Anesthesia Procedure Notes (Signed)
 Procedure Name: Intubation Date/Time: 07/18/2023 9:04 AM  Performed by: Lamar Lucie DASEN, CRNAPre-anesthesia Checklist: Patient identified, Emergency Drugs available, Suction available and Patient being monitored Patient Re-evaluated:Patient Re-evaluated prior to induction Oxygen Delivery Method: Circle system utilized Preoxygenation: Pre-oxygenation with 100% oxygen Induction Type: IV induction Ventilation: Mask ventilation without difficulty Laryngoscope Size: Mac and 3 Grade View: Grade I Tube type: Oral Tube size: 7.0 mm Number of attempts: 1 Airway Equipment and Method: Stylet and Oral airway Placement Confirmation: ETT inserted through vocal cords under direct vision, positive ETCO2 and breath sounds checked- equal and bilateral Secured at: 21 cm Tube secured with: Tape Dental Injury: Teeth and Oropharynx as per pre-operative assessment

## 2023-07-18 NOTE — Plan of Care (Signed)
  Problem: Clinical Measurements: Goal: Cardiovascular complication will be avoided Outcome: Progressing   Problem: Activity: Goal: Risk for activity intolerance will decrease Outcome: Progressing   Problem: Coping: Goal: Level of anxiety will decrease Outcome: Progressing   Problem: Pain Management: Goal: General experience of comfort will improve Outcome: Progressing   Problem: Safety: Goal: Ability to remain free from injury will improve Outcome: Progressing   Problem: Skin Integrity: Goal: Risk for impaired skin integrity will decrease Outcome: Progressing

## 2023-07-18 NOTE — Progress Notes (Addendum)
 * Day of Surgery *  Subjective: No new complaints. Tbili 1.0  ROS: See above, otherwise other systems negative  Objective: Vital signs in last 24 hours: Temp:  [98.4 F (36.9 C)-98.9 F (37.2 C)] 98.6 F (37 C) (01/09 0400) Pulse Rate:  [76-89] 76 (01/09 0818) Resp:  [16-18] 18 (01/09 0818) BP: (112-129)/(65-76) 112/76 (01/09 0818) SpO2:  [93 %-94 %] 94 % (01/09 0818) Weight:  [114.3 kg] 114.3 kg (01/08 1418)    Intake/Output from previous day: 01/08 0701 - 01/09 0700 In: 197.1 [P.O.:120; IV Piggyback:77.1] Out: -  Intake/Output this shift: No intake/output data recorded.  PE: Gen: female resting in bed, NAD Abd: soft, non-distended, mild TTP in the RUQ  Lab Results:  Recent Labs    07/17/23 1952 07/18/23 0548  WBC 14.6* 13.6*  HGB 12.5 11.7*  HCT 40.3 36.4  PLT 414* 407*   BMET Recent Labs    07/16/23 1522 07/17/23 1952 07/18/23 0548  NA 137  --  137  K 4.2  --  3.5  CL 104  --  102  CO2 23  --  23  GLUCOSE 121*  --  131*  BUN 13  --  20  CREATININE 1.16* 1.25* 1.22*  CALCIUM  8.9  --  8.4*   PT/INR Recent Labs    07/18/23 0548  LABPROT 15.4*  INR 1.2   CMP     Component Value Date/Time   NA 137 07/18/2023 0548   K 3.5 07/18/2023 0548   CL 102 07/18/2023 0548   CO2 23 07/18/2023 0548   GLUCOSE 131 (H) 07/18/2023 0548   BUN 20 07/18/2023 0548   CREATININE 1.22 (H) 07/18/2023 0548   CALCIUM  8.4 (L) 07/18/2023 0548   PROT 6.2 (L) 07/18/2023 0548   ALBUMIN 2.2 (L) 07/18/2023 0548   AST 28 07/18/2023 0548   ALT 43 07/18/2023 0548   ALKPHOS 107 07/18/2023 0548   BILITOT 1.0 07/18/2023 0548   GFRNONAA 47 (L) 07/18/2023 0548   Lipase     Component Value Date/Time   LIPASE 22 07/16/2023 1522    Studies/Results: MR ABDOMEN MRCP WO CONTRAST Result Date: 07/18/2023 CLINICAL DATA:  Gallstones with findings of cholecystitis on ultrasound. EXAM: MRI ABDOMEN WITHOUT CONTRAST  (INCLUDING MRCP) TECHNIQUE: Multiplanar multisequence MR imaging  of the abdomen was performed. Heavily T2-weighted images of the biliary and pancreatic ducts were obtained, and three-dimensional MRCP images were rendered by post processing. COMPARISON:  Ultrasound and CT scans from 07/16/2023 FINDINGS: Lower chest: Trace right pleural effusion. Hepatobiliary: Motion degraded exam shows no focal suspicious abnormality within the liver parenchyma. Trace intrahepatic biliary duct prominence. Gallbladder wall thickening evident with pericholecystic edema/inflammation. Gallbladder is distended with layering sludge and stones. Adjacent stones are seen in the neck of the gallbladder, potentially just into the proximal cystic duct (see coronal T2 images 28 in 29 of series 3 and axial T2 images 23 and 24 of series 4). Common duct is 8 mm diameter in the porta hepatis region. Common bile duct in the head of the pancreas measures 7 mm diameter. No definite stone is identified in the common bile duct. Axial T2 single shot fast spin echo imaging shows a central linear hypointense band in the distal common bile duct that is not present on the axial T2 fat suppressed imaging nor coronal single-shot and MRCP imaging. As such, this is felt to be more consistent with flow artifact than a true ductal stone. Pancreas: Pancreas is diffusely atrophic without main duct  dilatation. Spleen:  No splenomegaly. No suspicious focal mass lesion. Adrenals/Urinary Tract: No adrenal nodule or mass. Kidneys unremarkable. Stomach/Bowel: Stomach is unremarkable. No gastric wall thickening. No evidence of outlet obstruction. Duodenum is normally positioned as is the ligament of Treitz. Duodenal diverticulum noted. No small bowel or colonic dilatation within the visualized abdomen. Vascular/Lymphatic: No abdominal aortic aneurysm. No abdominal lymphadenopathy. Other:  No intraperitoneal free fluid. Musculoskeletal: No worrisome lytic or sclerotic osseous abnormality. IMPRESSION: 1. Gallbladder wall thickening with  pericholecystic edema/inflammation. Gallbladder is distended with layering sludge and stones. Adjacent stones are seen in the neck of the gallbladder, potentially just into the proximal cystic duct. Imaging features compatible with acute cholecystitis. 2. Common bile duct is 8 mm diameter in the porta hepatis region. Common bile duct in the head of the pancreas measures 7 mm diameter. No definite stone is identified in the common bile duct. Axial T2 single shot fast spin echo imaging shows a central linear hypointense band in the distal common bile duct that is not present on the axial T2 fat suppressed imaging nor coronal single-shot and MRCP imaging. As such, this is felt to be more consistent with flow artifact than a true ductal stone although a tiny distal common bile duct stone is not excluded. 3. Trace right pleural effusion. Electronically Signed   By: Camellia Candle M.D.   On: 07/18/2023 06:24   MR 3D Recon At Scanner Result Date: 07/18/2023 CLINICAL DATA:  Gallstones with findings of cholecystitis on ultrasound. EXAM: MRI ABDOMEN WITHOUT CONTRAST  (INCLUDING MRCP) TECHNIQUE: Multiplanar multisequence MR imaging of the abdomen was performed. Heavily T2-weighted images of the biliary and pancreatic ducts were obtained, and three-dimensional MRCP images were rendered by post processing. COMPARISON:  Ultrasound and CT scans from 07/16/2023 FINDINGS: Lower chest: Trace right pleural effusion. Hepatobiliary: Motion degraded exam shows no focal suspicious abnormality within the liver parenchyma. Trace intrahepatic biliary duct prominence. Gallbladder wall thickening evident with pericholecystic edema/inflammation. Gallbladder is distended with layering sludge and stones. Adjacent stones are seen in the neck of the gallbladder, potentially just into the proximal cystic duct (see coronal T2 images 28 in 29 of series 3 and axial T2 images 23 and 24 of series 4). Common duct is 8 mm diameter in the porta hepatis  region. Common bile duct in the head of the pancreas measures 7 mm diameter. No definite stone is identified in the common bile duct. Axial T2 single shot fast spin echo imaging shows a central linear hypointense band in the distal common bile duct that is not present on the axial T2 fat suppressed imaging nor coronal single-shot and MRCP imaging. As such, this is felt to be more consistent with flow artifact than a true ductal stone. Pancreas: Pancreas is diffusely atrophic without main duct dilatation. Spleen:  No splenomegaly. No suspicious focal mass lesion. Adrenals/Urinary Tract: No adrenal nodule or mass. Kidneys unremarkable. Stomach/Bowel: Stomach is unremarkable. No gastric wall thickening. No evidence of outlet obstruction. Duodenum is normally positioned as is the ligament of Treitz. Duodenal diverticulum noted. No small bowel or colonic dilatation within the visualized abdomen. Vascular/Lymphatic: No abdominal aortic aneurysm. No abdominal lymphadenopathy. Other:  No intraperitoneal free fluid. Musculoskeletal: No worrisome lytic or sclerotic osseous abnormality. IMPRESSION: 1. Gallbladder wall thickening with pericholecystic edema/inflammation. Gallbladder is distended with layering sludge and stones. Adjacent stones are seen in the neck of the gallbladder, potentially just into the proximal cystic duct. Imaging features compatible with acute cholecystitis. 2. Common bile duct is 8 mm  diameter in the porta hepatis region. Common bile duct in the head of the pancreas measures 7 mm diameter. No definite stone is identified in the common bile duct. Axial T2 single shot fast spin echo imaging shows a central linear hypointense band in the distal common bile duct that is not present on the axial T2 fat suppressed imaging nor coronal single-shot and MRCP imaging. As such, this is felt to be more consistent with flow artifact than a true ductal stone although a tiny distal common bile duct stone is not  excluded. 3. Trace right pleural effusion. Electronically Signed   By: Camellia Candle M.D.   On: 07/18/2023 06:24   US  Abdomen Limited RUQ (LIVER/GB) Result Date: 07/16/2023 CLINICAL DATA:  Right upper quadrant pain. Abnormal gallbladder on CT. EXAM: ULTRASOUND ABDOMEN LIMITED RIGHT UPPER QUADRANT COMPARISON:  Ultrasound earlier today FINDINGS: Gallbladder: Gallstones within the gallbladder measuring up to 1.6 cm. There is sludge layering in the gallbladder. Wall thickening of the 6 mm. Patient was tender over the gallbladder during the study. Pericholecystic fluid noted. Common bile duct: Diameter: Not visualized Liver: Increased echotexture compatible with fatty infiltration. No focal abnormality or biliary ductal dilatation. Portal vein is patent on color Doppler imaging with normal direction of blood flow towards the liver. Other: None. IMPRESSION: Cholelithiasis. Sludge, gallbladder wall thickening and pericholecystic with sonographic Murphy sign. Findings compatible with acute cholecystitis. Fatty liver. Electronically Signed   By: Franky Crease M.D.   On: 07/16/2023 21:10   CT ABDOMEN PELVIS WO CONTRAST Result Date: 07/16/2023 CLINICAL DATA:  Upper abdominal and back pain EXAM: CT ABDOMEN AND PELVIS WITHOUT CONTRAST TECHNIQUE: Multidetector CT imaging of the abdomen and pelvis was performed following the standard protocol without IV contrast. RADIATION DOSE REDUCTION: This exam was performed according to the departmental dose-optimization program which includes automated exposure control, adjustment of the mA and/or kV according to patient size and/or use of iterative reconstruction technique. COMPARISON:  Overlapping portions CT chest 07/16/2023 FINDINGS: Lower chest: Unremarkable Hepatobiliary: Substantial abnormal wall thickening in the gallbladder with associated pericholecystic stranding. Several gallstones measure up to 1 cm in diameter. There is also a go gallstone measuring 1 cm in the neck of the  gallbladder. No CBD dilatation is observed. Appearance raises suspicion for acute cholecystitis. Pancreas: Unremarkable Spleen: Unremarkable Adrenals/Urinary Tract: Both adrenal glands appear normal. Excreted contrast observed in the collecting systems, ureters, and urinary bladder. No significant abnormality observed. Stomach/Bowel: Periampullary duodenal diverticulum. There are diverticula in the fourth portion of the duodenum without signs of inflammation. Normal appendix. There are few scattered sigmoid colon diverticula. Vascular/Lymphatic: Atherosclerosis is present, including aortoiliac atherosclerotic disease. Reproductive: Calcified lesion favoring fibroid along the left uterine body. Masslike central uterine lesion probably represents leiomyoma given the similar appearance on pelvic ultrasound of 01/27/2021. Other: No supplemental non-categorized findings. Musculoskeletal: Mild grade 1 degenerative anterolisthesis at L4-5. IMPRESSION: 1. Substantial abnormal wall thickening in the gallbladder with associated pericholecystic stranding and cholelithiasis. Appearance suspicious for acute cholecystitis. 2. Periampullary and distal duodenal diverticula without diverticular inflammation. 3. Sigmoid colon diverticulosis. 4. Mild grade 1 degenerative anterolisthesis at L4-5. 5. Large central uterine leiomyoma as reported on a prior pelvic ultrasound. 6. Aortic atherosclerosis. Aortic Atherosclerosis (ICD10-I70.0). Electronically Signed   By: Ryan Salvage M.D.   On: 07/16/2023 19:29   CT T-SPINE NO CHARGE Result Date: 07/16/2023 CLINICAL DATA:  Back pain EXAM: CT THORACIC SPINE WITHOUT CONTRAST TECHNIQUE: Multidetector CT images of the thoracic were obtained using the standard protocol without intravenous contrast.  RADIATION DOSE REDUCTION: This exam was performed according to the departmental dose-optimization program which includes automated exposure control, adjustment of the mA and/or kV according to  patient size and/or use of iterative reconstruction technique. COMPARISON:  None Available. FINDINGS: Alignment: Dextroscoliosis. Sagittal alignment within normal limits. No subluxation Vertebrae: No acute fracture or focal pathologic process. Paraspinal and other soft tissues: Negative. Disc levels: Multilevel degenerative osteophytes. No abnormal disc space narrowing or widening. IMPRESSION: Dextroscoliosis with mild degenerative change. No acute osseous abnormality. Electronically Signed   By: Luke Bun M.D.   On: 07/16/2023 18:54   CT Angio Chest PE W and/or Wo Contrast Result Date: 07/16/2023 CLINICAL DATA:  Pain and tenderness across the middle of the back for the past week, worse with a deep breath. History of shingles. Clinical concern pulmonary embolism. EXAM: CT ANGIOGRAPHY CHEST WITH CONTRAST TECHNIQUE: Multidetector CT imaging of the chest was performed using the standard protocol during bolus administration of intravenous contrast. Multiplanar CT image reconstructions and MIPs were obtained to evaluate the vascular anatomy. RADIATION DOSE REDUCTION: This exam was performed according to the departmental dose-optimization program which includes automated exposure control, adjustment of the mA and/or kV according to patient size and/or use of iterative reconstruction technique. CONTRAST:  OMNIPAQUE  IOHEXOL  350 MG/ML SOLN COMPARISON:  Chest and bilateral rib radiographs dated 07/11/2023. FINDINGS: Cardiovascular: Mild atheromatous aortic calcifications. Normally opacified pulmonary arteries with no pulmonary arterial filling defects seen. Borderline enlarged heart. No pericardial effusion. Mediastinum/Nodes: No enlarged mediastinal, hilar, or axillary lymph nodes. Thyroid  gland, trachea, and esophagus demonstrate no significant findings. Lungs/Pleura: Lungs are clear. No pleural effusion or pneumothorax. Upper Abdomen: Mild diffuse low-density of the liver. Musculoskeletal: Thoracic and lower  cervical spine degenerative changes and mild scoliosis. Review of the MIP images confirms the above findings. IMPRESSION: 1. No pulmonary embolism or other acute abnormality. 2. Borderline cardiomegaly. 3. Mild hepatic steatosis. 4. Aortic atherosclerosis. Aortic Atherosclerosis (ICD10-I70.0). Electronically Signed   By: Elspeth Bathe M.D.   On: 07/16/2023 18:20    Anti-infectives: Anti-infectives (From admission, onward)    Start     Dose/Rate Route Frequency Ordered Stop   07/18/23 0830  ceFAZolin  (ANCEF ) 3 g in sodium chloride  0.9 % 100 mL IVPB        3 g 200 mL/hr over 30 Minutes Intravenous On call to O.R. 07/18/23 0818 07/19/23 0559   07/18/23 0814  sodium chloride  0.9 % with ceFAZolin  (ANCEF ) ADS Med       Note to Pharmacy: Marvin Domino E: cabinet override      07/18/23 0814 07/18/23 2029   07/17/23 1430  ceFAZolin  (ANCEF ) 3 g in sodium chloride  0.9 % 100 mL IVPB  Status:  Discontinued        3 g 200 mL/hr over 30 Minutes Intravenous On call to O.R. 07/17/23 1352 07/17/23 1822   07/17/23 1403  sodium chloride  0.9 % with ceFAZolin  (ANCEF ) ADS Med  Status:  Discontinued       Note to Pharmacy: Jonda Yancy HERO: cabinet override      07/17/23 1403 07/17/23 1741   07/17/23 0800  [MAR Hold]  piperacillin -tazobactam (ZOSYN ) IVPB 3.375 g        (MAR Hold since Thu 07/18/2023 at 0807.Hold Reason: Transfer to a Procedural area)   3.375 g 12.5 mL/hr over 240 Minutes Intravenous Every 8 hours 07/17/23 0759     07/16/23 1945  piperacillin -tazobactam (ZOSYN ) IVPB 3.375 g        3.375 g 100 mL/hr over 30  Minutes Intravenous  Once 07/16/23 1934 07/16/23 2228       Assessment/Plan 73 y/o F who presented with abdominal pain and has imaging and exam c/w acute cholecystitis   - Will proceed to the OR. We discussed the alternatives and potential risks of surgery, including but not limited to: bleeding, infection, damage to bowel or surrounding structures, bile leak, damage to biliary structures,  pancreatitis, retained stone, need for additional procedures. All questions were addressed and consent was obtained.     LOS: 0 days   Tricia [facility] 07/18/2023, 8:27 AM Please see Amion for pager number during day hours 7:00am-4:30pm or 7:00am -11:30am on weekends

## 2023-07-18 NOTE — Progress Notes (Addendum)
 PROGRESS NOTE    Tricia Dodson  FMW:[id number] DOB: October 19, 1950 DOA: 07/16/2023 PCP: Jason Leita Repine, FNP    Brief Narrative:  Tricia Dodson is a 73 y.o. female with medical history significant of obstructive sleep apnea, CPAP use, obesity.  Patient reports being in her usual state of health till approximately 4 days ago when she reports an abrupt onset of right upper quadrant abdominal pain with slight radiation posteriorly laterally.  This was associated with marked diminishment of appetite.  No report of fever or vomiting or diarrhea.  Patient actually has been constipated.  Patient actually presented to [facility] ER yesterday with above complaints.  White blood cell of 17.2 lipase within normal limits.  AST ALT slightly elevated at 59 and 71 respectively.  Lactic acid of 1.4.  Ultrasound abdomen showed sonographic Murphy sign compatible with acute cholecystitis.   Patient was awaiting bed placement in ER since yesterday.  Finally transferred to [facility] earlier this afternoon.  Medical evaluation is sought.   Patient continues to have right upper quadrant area abdominal pain.  Poor appetite.  But thirsty.  No fevers or rigors.  Patient otherwise reports feeling okay   Assessment and Plan:  * Acute cholecystitis -s/p subtotal GB removal by GS -will need a few days of abx   AKI (acute kidney injury) (HCC) -resume clears  Blood culture with strep anginosis -on abx-- may need prolonged treatment with abx    Hypertension -BP controlled   OSA - (continuous positive airway pressure) dependence -CPAP     Patient has chronic shingle pain - cw. Gabapentin   Hypothyroid - c.w. synthroid .  Obesity Estimated body mass index is 39.47 kg/m as calculated from the following:   Height as of this encounter: 5' 7 (1.702 m).   Weight as of this encounter: 114.3 kg.   DVT prophylaxis: SCDs Start: 07/17/23 1913    Code Status: Full Code Family  Communication:   Disposition Plan:  Level of care: Med-Surg Status is: Observation The patient will require care spanning > 2 midnights and should be moved to inpatient     Consultants:  GS   Subjective: tired  Objective: Vitals:   07/18/23 1100 07/18/23 1115 07/18/23 1123 07/18/23 1210  BP: 126/69 126/70  136/74  Pulse: 80 80 79 77  Resp: 17 18 17 18   Temp:  98.7 F (37.1 C)  98 F (36.7 C)  TempSrc:    Oral  SpO2: 95% 94% 94% 95%  Weight:      Height:        Intake/Output Summary (Last 24 hours) at 07/18/2023 1409 Last data filed at 07/18/2023 1100 Gross per 24 hour  Intake 697.12 ml  Output 40 ml  Net 657.12 ml   Filed Weights   07/16/23 1318 07/17/23 1418  Weight: 115 kg 114.3 kg    Examination:   General: Appearance:    Obese female in no acute distress     Lungs:      respirations unlabored  Heart:    Normal heart rate.     MS:   All extremities are intact.    Neurologic:   Awake, alert       Data Reviewed: I have personally reviewed following labs and imaging studies  CBC: Recent Labs  Lab 07/16/23 1522 07/17/23 1952 07/18/23 0548  WBC 17.2* 14.6* 13.6*  NEUTROABS 13.5*  --   --   HGB 12.9 12.5 11.7*  HCT 40.3 40.3 36.4  MCV 85.0 86.5  84.7  PLT 409* 414* 407*   Basic Metabolic Panel: Recent Labs  Lab 07/16/23 1522 07/17/23 1952 07/18/23 0548  NA 137  --  137  K 4.2  --  3.5  CL 104  --  102  CO2 23  --  23  GLUCOSE 121*  --  131*  BUN 13  --  20  CREATININE 1.16* 1.25* 1.22*  CALCIUM  8.9  --  8.4*   GFR: Estimated Creatinine Clearance: 54.4 mL/min (A) (by C-G formula based on SCr of 1.22 mg/dL (H)). Liver Function Tests: Recent Labs  Lab 07/16/23 1522 07/18/23 0548  AST 59* 28  ALT 71* 43  ALKPHOS 112 107  BILITOT 0.8 1.0  PROT 7.8 6.2*  ALBUMIN 3.3* 2.2*   Recent Labs  Lab 07/16/23 1522  LIPASE 22   No results for input(s): AMMONIA in the last 168 hours. Coagulation Profile: Recent Labs  Lab  07/18/23 0548  INR 1.2   Cardiac Enzymes: No results for input(s): CKTOTAL, CKMB, CKMBINDEX, TROPONINI in the last 168 hours. BNP (last 3 results) No results for input(s): PROBNP in the last 8760 hours. HbA1C: No results for input(s): HGBA1C in the last 72 hours. CBG: Recent Labs  Lab 07/17/23 1422 07/18/23 0755 07/18/23 1024  GLUCAP 128* 125* 127*   Lipid Profile: No results for input(s): CHOL, HDL, LDLCALC, TRIG, CHOLHDL, LDLDIRECT in the last 72 hours. Thyroid  Function Tests: No results for input(s): TSH, T4TOTAL, FREET4, T3FREE, THYROIDAB in the last 72 hours. Anemia Panel: No results for input(s): VITAMINB12, FOLATE, FERRITIN, TIBC, IRON, RETICCTPCT in the last 72 hours. Sepsis Labs: Recent Labs  Lab 07/16/23 2015  LATICACIDVEN 1.4    Recent Results (from the past 240 hours)  Blood culture (routine x 2)     Status: None (Preliminary result)   Collection Time: 07/16/23  8:14 PM   Specimen: Right Antecubital; Blood  Result Value Ref Range Status   Specimen Description   Final    RIGHT ANTECUBITAL Performed at [facility], [address] [place], [place] [address]    Special Requests   Final    BOTTLES DRAWN AEROBIC AND ANAEROBIC Blood Culture adequate volume Performed at [facility], [address] [place], [place] [address]    Culture  Setup Time   Final    GRAM POSITIVE COCCI AEROBIC BOTTLE ONLY CRITICAL RESULT CALLED TO, READ BACK BY AND VERIFIED WITH: PHARMD J LEDFORD 07/18/2023 @ 0151 BY AB    Culture   Final    CULTURE REINCUBATED FOR BETTER GROWTH Performed at [facility], [address] [place], [place] [address]    Report Status PENDING  Incomplete  Blood culture (routine x 2)     Status: None (Preliminary result)   Collection Time: 07/16/23  8:14 PM   Specimen: Left Antecubital; Blood  Result Value Ref Range Status   Specimen Description   Final    LEFT  ANTECUBITAL Performed at [facility], [address] [place], [place] [address]    Special Requests   Final    BOTTLES DRAWN AEROBIC AND ANAEROBIC Blood Culture adequate volume Performed at [facility], [address] [place], [place] [address]    Culture   Final    NO GROWTH 2 DAYS Performed at [facility], [address], [place], [place] [address]    Report Status PENDING  Incomplete  Blood Culture ID Panel (Reflexed)  Status: Abnormal   Collection Time: 07/16/23  8:14 PM  Result Value Ref Range Status   Enterococcus faecalis NOT DETECTED NOT DETECTED Final   Enterococcus Faecium NOT DETECTED NOT DETECTED Final   Listeria monocytogenes NOT DETECTED NOT DETECTED Final   Staphylococcus species NOT DETECTED NOT DETECTED Final   Staphylococcus aureus (BCID) NOT DETECTED NOT DETECTED Final   Staphylococcus epidermidis NOT DETECTED NOT DETECTED Final   Staphylococcus lugdunensis NOT DETECTED NOT DETECTED Final   Streptococcus species DETECTED (A) NOT DETECTED Final    Comment: Not Enterococcus species, Streptococcus agalactiae, Streptococcus pyogenes, or Streptococcus pneumoniae. CRITICAL RESULT CALLED TO, READ BACK BY AND VERIFIED WITH: PHARMD J LEDFORD 07/18/2023 @ 0151 BY AB    Streptococcus agalactiae NOT DETECTED NOT DETECTED Final   Streptococcus pneumoniae NOT DETECTED NOT DETECTED Final   Streptococcus pyogenes NOT DETECTED NOT DETECTED Final   A.calcoaceticus-baumannii NOT DETECTED NOT DETECTED Final   Bacteroides fragilis NOT DETECTED NOT DETECTED Final   Enterobacterales NOT DETECTED NOT DETECTED Final   Enterobacter cloacae complex NOT DETECTED NOT DETECTED Final   Escherichia coli NOT DETECTED NOT DETECTED Final   Klebsiella aerogenes NOT DETECTED NOT DETECTED Final   Klebsiella oxytoca NOT DETECTED NOT DETECTED Final   Klebsiella pneumoniae NOT DETECTED NOT DETECTED Final   Proteus species NOT DETECTED NOT DETECTED Final   Salmonella  species NOT DETECTED NOT DETECTED Final   Serratia marcescens NOT DETECTED NOT DETECTED Final   Haemophilus influenzae NOT DETECTED NOT DETECTED Final   Neisseria meningitidis NOT DETECTED NOT DETECTED Final   Pseudomonas aeruginosa NOT DETECTED NOT DETECTED Final   Stenotrophomonas maltophilia NOT DETECTED NOT DETECTED Final   Candida albicans NOT DETECTED NOT DETECTED Final   Candida auris NOT DETECTED NOT DETECTED Final   Candida glabrata NOT DETECTED NOT DETECTED Final   Candida krusei NOT DETECTED NOT DETECTED Final   Candida parapsilosis NOT DETECTED NOT DETECTED Final   Candida tropicalis NOT DETECTED NOT DETECTED Final   Cryptococcus neoformans/gattii NOT DETECTED NOT DETECTED Final    Comment: Performed at [facility], [address], [place], [place] [address]  Surgical pcr screen     Status: Abnormal   Collection Time: 07/18/23  3:44 AM   Specimen: Nasal Mucosa; Nasal Swab  Result Value Ref Range Status   MRSA, PCR NEGATIVE NEGATIVE Final   Staphylococcus aureus POSITIVE (A) NEGATIVE Final    Comment: (NOTE) The Xpert SA Assay (FDA approved for NASAL specimens in patients 27 years of age and older), is one component of a comprehensive surveillance program. It is not intended to diagnose infection nor to guide or monitor treatment. Performed at [facility], [address], [place], [place] [address]          Radiology Studies: MR ABDOMEN MRCP WO CONTRAST Result Date: 07/18/2023 CLINICAL DATA:  Gallstones with findings of cholecystitis on ultrasound. EXAM: MRI ABDOMEN WITHOUT CONTRAST  (INCLUDING MRCP) TECHNIQUE: Multiplanar multisequence MR imaging of the abdomen was performed. Heavily T2-weighted images of the biliary and pancreatic ducts were obtained, and three-dimensional MRCP images were rendered by post processing. COMPARISON:  Ultrasound and CT scans from 07/16/2023 FINDINGS: Lower chest: Trace right pleural effusion. Hepatobiliary: Motion degraded  exam shows no focal suspicious abnormality within the liver parenchyma. Trace intrahepatic biliary duct prominence. Gallbladder wall thickening evident with pericholecystic edema/inflammation. Gallbladder is distended with layering sludge and stones. Adjacent stones are seen in the neck of the gallbladder, potentially just into the proximal cystic duct (see  coronal T2 images 28 in 29 of series 3 and axial T2 images 23 and 24 of series 4). Common duct is 8 mm diameter in the porta hepatis region. Common bile duct in the head of the pancreas measures 7 mm diameter. No definite stone is identified in the common bile duct. Axial T2 single shot fast spin echo imaging shows a central linear hypointense band in the distal common bile duct that is not present on the axial T2 fat suppressed imaging nor coronal single-shot and MRCP imaging. As such, this is felt to be more consistent with flow artifact than a true ductal stone. Pancreas: Pancreas is diffusely atrophic without main duct dilatation. Spleen:  No splenomegaly. No suspicious focal mass lesion. Adrenals/Urinary Tract: No adrenal nodule or mass. Kidneys unremarkable. Stomach/Bowel: Stomach is unremarkable. No gastric wall thickening. No evidence of outlet obstruction. Duodenum is normally positioned as is the ligament of Treitz. Duodenal diverticulum noted. No small bowel or colonic dilatation within the visualized abdomen. Vascular/Lymphatic: No abdominal aortic aneurysm. No abdominal lymphadenopathy. Other:  No intraperitoneal free fluid. Musculoskeletal: No worrisome lytic or sclerotic osseous abnormality. IMPRESSION: 1. Gallbladder wall thickening with pericholecystic edema/inflammation. Gallbladder is distended with layering sludge and stones. Adjacent stones are seen in the neck of the gallbladder, potentially just into the proximal cystic duct. Imaging features compatible with acute cholecystitis. 2. Common bile duct is 8 mm diameter in the porta hepatis  region. Common bile duct in the head of the pancreas measures 7 mm diameter. No definite stone is identified in the common bile duct. Axial T2 single shot fast spin echo imaging shows a central linear hypointense band in the distal common bile duct that is not present on the axial T2 fat suppressed imaging nor coronal single-shot and MRCP imaging. As such, this is felt to be more consistent with flow artifact than a true ductal stone although a tiny distal common bile duct stone is not excluded. 3. Trace right pleural effusion. Electronically Signed   By: Camellia Candle M.D.   On: 07/18/2023 06:24   MR 3D Recon At Scanner Result Date: 07/18/2023 CLINICAL DATA:  Gallstones with findings of cholecystitis on ultrasound. EXAM: MRI ABDOMEN WITHOUT CONTRAST  (INCLUDING MRCP) TECHNIQUE: Multiplanar multisequence MR imaging of the abdomen was performed. Heavily T2-weighted images of the biliary and pancreatic ducts were obtained, and three-dimensional MRCP images were rendered by post processing. COMPARISON:  Ultrasound and CT scans from 07/16/2023 FINDINGS: Lower chest: Trace right pleural effusion. Hepatobiliary: Motion degraded exam shows no focal suspicious abnormality within the liver parenchyma. Trace intrahepatic biliary duct prominence. Gallbladder wall thickening evident with pericholecystic edema/inflammation. Gallbladder is distended with layering sludge and stones. Adjacent stones are seen in the neck of the gallbladder, potentially just into the proximal cystic duct (see coronal T2 images 28 in 29 of series 3 and axial T2 images 23 and 24 of series 4). Common duct is 8 mm diameter in the porta hepatis region. Common bile duct in the head of the pancreas measures 7 mm diameter. No definite stone is identified in the common bile duct. Axial T2 single shot fast spin echo imaging shows a central linear hypointense band in the distal common bile duct that is not present on the axial T2 fat suppressed imaging nor  coronal single-shot and MRCP imaging. As such, this is felt to be more consistent with flow artifact than a true ductal stone. Pancreas: Pancreas is diffusely atrophic without main duct dilatation. Spleen:  No splenomegaly. No suspicious focal  mass lesion. Adrenals/Urinary Tract: No adrenal nodule or mass. Kidneys unremarkable. Stomach/Bowel: Stomach is unremarkable. No gastric wall thickening. No evidence of outlet obstruction. Duodenum is normally positioned as is the ligament of Treitz. Duodenal diverticulum noted. No small bowel or colonic dilatation within the visualized abdomen. Vascular/Lymphatic: No abdominal aortic aneurysm. No abdominal lymphadenopathy. Other:  No intraperitoneal free fluid. Musculoskeletal: No worrisome lytic or sclerotic osseous abnormality. IMPRESSION: 1. Gallbladder wall thickening with pericholecystic edema/inflammation. Gallbladder is distended with layering sludge and stones. Adjacent stones are seen in the neck of the gallbladder, potentially just into the proximal cystic duct. Imaging features compatible with acute cholecystitis. 2. Common bile duct is 8 mm diameter in the porta hepatis region. Common bile duct in the head of the pancreas measures 7 mm diameter. No definite stone is identified in the common bile duct. Axial T2 single shot fast spin echo imaging shows a central linear hypointense band in the distal common bile duct that is not present on the axial T2 fat suppressed imaging nor coronal single-shot and MRCP imaging. As such, this is felt to be more consistent with flow artifact than a true ductal stone although a tiny distal common bile duct stone is not excluded. 3. Trace right pleural effusion. Electronically Signed   By: Camellia Candle M.D.   On: 07/18/2023 06:24   US  Abdomen Limited RUQ (LIVER/GB) Result Date: 07/16/2023 CLINICAL DATA:  Right upper quadrant pain. Abnormal gallbladder on CT. EXAM: ULTRASOUND ABDOMEN LIMITED RIGHT UPPER QUADRANT COMPARISON:   Ultrasound earlier today FINDINGS: Gallbladder: Gallstones within the gallbladder measuring up to 1.6 cm. There is sludge layering in the gallbladder. Wall thickening of the 6 mm. Patient was tender over the gallbladder during the study. Pericholecystic fluid noted. Common bile duct: Diameter: Not visualized Liver: Increased echotexture compatible with fatty infiltration. No focal abnormality or biliary ductal dilatation. Portal vein is patent on color Doppler imaging with normal direction of blood flow towards the liver. Other: None. IMPRESSION: Cholelithiasis. Sludge, gallbladder wall thickening and pericholecystic with sonographic Murphy sign. Findings compatible with acute cholecystitis. Fatty liver. Electronically Signed   By: Franky Crease M.D.   On: 07/16/2023 21:10   CT ABDOMEN PELVIS WO CONTRAST Result Date: 07/16/2023 CLINICAL DATA:  Upper abdominal and back pain EXAM: CT ABDOMEN AND PELVIS WITHOUT CONTRAST TECHNIQUE: Multidetector CT imaging of the abdomen and pelvis was performed following the standard protocol without IV contrast. RADIATION DOSE REDUCTION: This exam was performed according to the departmental dose-optimization program which includes automated exposure control, adjustment of the mA and/or kV according to patient size and/or use of iterative reconstruction technique. COMPARISON:  Overlapping portions CT chest 07/16/2023 FINDINGS: Lower chest: Unremarkable Hepatobiliary: Substantial abnormal wall thickening in the gallbladder with associated pericholecystic stranding. Several gallstones measure up to 1 cm in diameter. There is also a go gallstone measuring 1 cm in the neck of the gallbladder. No CBD dilatation is observed. Appearance raises suspicion for acute cholecystitis. Pancreas: Unremarkable Spleen: Unremarkable Adrenals/Urinary Tract: Both adrenal glands appear normal. Excreted contrast observed in the collecting systems, ureters, and urinary bladder. No significant abnormality  observed. Stomach/Bowel: Periampullary duodenal diverticulum. There are diverticula in the fourth portion of the duodenum without signs of inflammation. Normal appendix. There are few scattered sigmoid colon diverticula. Vascular/Lymphatic: Atherosclerosis is present, including aortoiliac atherosclerotic disease. Reproductive: Calcified lesion favoring fibroid along the left uterine body. Masslike central uterine lesion probably represents leiomyoma given the similar appearance on pelvic ultrasound of 01/27/2021. Other: No supplemental non-categorized findings. Musculoskeletal: Mild  grade 1 degenerative anterolisthesis at L4-5. IMPRESSION: 1. Substantial abnormal wall thickening in the gallbladder with associated pericholecystic stranding and cholelithiasis. Appearance suspicious for acute cholecystitis. 2. Periampullary and distal duodenal diverticula without diverticular inflammation. 3. Sigmoid colon diverticulosis. 4. Mild grade 1 degenerative anterolisthesis at L4-5. 5. Large central uterine leiomyoma as reported on a prior pelvic ultrasound. 6. Aortic atherosclerosis. Aortic Atherosclerosis (ICD10-I70.0). Electronically Signed   By: Ryan Salvage M.D.   On: 07/16/2023 19:29   CT T-SPINE NO CHARGE Result Date: 07/16/2023 CLINICAL DATA:  Back pain EXAM: CT THORACIC SPINE WITHOUT CONTRAST TECHNIQUE: Multidetector CT images of the thoracic were obtained using the standard protocol without intravenous contrast. RADIATION DOSE REDUCTION: This exam was performed according to the departmental dose-optimization program which includes automated exposure control, adjustment of the mA and/or kV according to patient size and/or use of iterative reconstruction technique. COMPARISON:  None Available. FINDINGS: Alignment: Dextroscoliosis. Sagittal alignment within normal limits. No subluxation Vertebrae: No acute fracture or focal pathologic process. Paraspinal and other soft tissues: Negative. Disc levels: Multilevel  degenerative osteophytes. No abnormal disc space narrowing or widening. IMPRESSION: Dextroscoliosis with mild degenerative change. No acute osseous abnormality. Electronically Signed   By: Luke Bun M.D.   On: 07/16/2023 18:54   CT Angio Chest PE W and/or Wo Contrast Result Date: 07/16/2023 CLINICAL DATA:  Pain and tenderness across the middle of the back for the past week, worse with a deep breath. History of shingles. Clinical concern pulmonary embolism. EXAM: CT ANGIOGRAPHY CHEST WITH CONTRAST TECHNIQUE: Multidetector CT imaging of the chest was performed using the standard protocol during bolus administration of intravenous contrast. Multiplanar CT image reconstructions and MIPs were obtained to evaluate the vascular anatomy. RADIATION DOSE REDUCTION: This exam was performed according to the departmental dose-optimization program which includes automated exposure control, adjustment of the mA and/or kV according to patient size and/or use of iterative reconstruction technique. CONTRAST:  OMNIPAQUE  IOHEXOL  350 MG/ML SOLN COMPARISON:  Chest and bilateral rib radiographs dated 07/11/2023. FINDINGS: Cardiovascular: Mild atheromatous aortic calcifications. Normally opacified pulmonary arteries with no pulmonary arterial filling defects seen. Borderline enlarged heart. No pericardial effusion. Mediastinum/Nodes: No enlarged mediastinal, hilar, or axillary lymph nodes. Thyroid  gland, trachea, and esophagus demonstrate no significant findings. Lungs/Pleura: Lungs are clear. No pleural effusion or pneumothorax. Upper Abdomen: Mild diffuse low-density of the liver. Musculoskeletal: Thoracic and lower cervical spine degenerative changes and mild scoliosis. Review of the MIP images confirms the above findings. IMPRESSION: 1. No pulmonary embolism or other acute abnormality. 2. Borderline cardiomegaly. 3. Mild hepatic steatosis. 4. Aortic atherosclerosis. Aortic Atherosclerosis (ICD10-I70.0). Electronically  Signed   By: Elspeth Bathe M.D.   On: 07/16/2023 18:20        Scheduled Meds:  acetaminophen   650 mg Oral Q4H   amitriptyline   10 mg Oral QHS   atorvastatin   10 mg Oral Daily   DULoxetine   20 mg Oral Daily   enoxaparin  (LOVENOX ) injection  55 mg Subcutaneous QHS   gabapentin   300 mg Oral BID   levothyroxine   25 mcg Oral Q0600   pantoprazole   40 mg Oral Daily   senna-docusate  2 tablet Oral BID   sodium chloride  flush  3 mL Intravenous Q12H   Continuous Infusions:  piperacillin -tazobactam (ZOSYN )  IV 3.375 g (07/18/23 0820)   sodium chloride  0.9 % with ceFAZolin  (ANCEF ) ADS Med Stopped (07/18/23 0830)     LOS: 0 days    Time spent: 45 minutes spent on chart review, discussion with nursing staff,  consultants, updating family and interview/physical exam; more than 50% of that time was spent in counseling and/or coordination of care.    Harlene RAYMOND Bowl, DO [facility] Available via Epic secure chat 7am-7pm After these hours, please refer to coverage provider listed on [email or web address] 07/18/2023, 2:09 PM

## 2023-07-18 NOTE — Anesthesia Preprocedure Evaluation (Signed)
 Anesthesia Evaluation  Patient identified by MRN, date of birth, ID band Patient awake    Reviewed: Allergy & Precautions, H&P , NPO status , Patient's Chart, lab work & pertinent test results  Airway Mallampati: II   Neck ROM: full    Dental   Pulmonary sleep apnea , former smoker   breath sounds clear to auscultation       Cardiovascular hypertension,  Rhythm:regular Rate:Normal     Neuro/Psych  Headaches    GI/Hepatic ,GERD  ,,  Endo/Other  diabetes, Type 2    Renal/GU      Musculoskeletal  (+) Arthritis ,    Abdominal   Peds  Hematology   Anesthesia Other Findings   Reproductive/Obstetrics                             Anesthesia Physical Anesthesia Plan  ASA: 3  Anesthesia Plan: General   Post-op Pain Management:    Induction: Intravenous  PONV Risk Score and Plan: 3 and Ondansetron , Dexamethasone  and Treatment may vary due to age or medical condition  Airway Management Planned: Oral ETT and Video Laryngoscope Planned  Additional Equipment:   Intra-op Plan:   Post-operative Plan: Extubation in OR  Informed Consent: I have reviewed the patients History and Physical, chart, labs and discussed the procedure including the risks, benefits and alternatives for the proposed anesthesia with the patient or authorized representative who has indicated his/her understanding and acceptance.     Dental advisory given  Plan Discussed with: CRNA, Anesthesiologist and Surgeon  Anesthesia Plan Comments:        Anesthesia Quick Evaluation

## 2023-07-18 NOTE — TOC Initial Note (Signed)
 Transition of Care (TOC) - Initial/Assessment Note   Spoke to patient at bedside. PAtient from home alone. Has friends who can assist at discharge if needed.     Transition of Care Department [facility]) has reviewed patient and no TOC needs have been identified at this time. We will continue to monitor patient advancement through interdisciplinary progression rounds. If new patient transition needs arise, please place a TOC consult.   Patient Details  Name: Tricia Dodson MRN: [id number] Date of Birth: 09/05/1950  Transition of Care [facility]) CM/SW Contact:    Stephane Powell Jansky, RN Phone Number: 07/18/2023, 2:02 PM  Clinical Narrative:                   Expected Discharge Plan: Home/Self Care Barriers to Discharge: Continued Medical Work up   Patient Goals and CMS Choice Patient states their goals for this hospitalization and ongoing recovery are:: to return to home          Expected Discharge Plan and Services   Discharge Planning Services: CM Consult Post Acute Care Choice: NA Living arrangements for the past 2 months: Single Family Home                 DME Arranged: N/A DME Agency: NA       HH Arranged: NA HH Agency: NA        Prior Living Arrangements/Services Living arrangements for the past 2 months: Single Family Home Lives with:: Self Patient language and need for interpreter reviewed:: Yes Do you feel safe going back to the place where you live?: Yes      Need for Family Participation in Patient Care: Yes (Comment) Care giver support system in place?: Yes (comment)   Criminal Activity/Legal Involvement Pertinent to Current Situation/Hospitalization: No - Comment as needed  Activities of Daily Living      Permission Sought/Granted   Permission granted to share information with : No              Emotional Assessment Appearance:: Appears stated age Attitude/Demeanor/Rapport: Engaged Affect (typically observed): Accepting Orientation: : Oriented  to Self, Oriented to Place, Oriented to  Time, Oriented to Situation Alcohol / Substance Use: Not Applicable Psych Involvement: No (comment)  Admission diagnosis:  Acute cholecystitis [K81.0] Cholecystitis [K81.9] Cholecystitis, acute [K81.0] Patient Active Problem List   Diagnosis Date Noted   Acute cholecystitis 07/17/2023   Cholecystitis, acute 07/17/2023   AKI (acute kidney injury) (HCC) 07/17/2023   Primary osteoarthritis of both knees 04/18/2021   Dizziness 12/02/2020   Nonspecific abnormal electrocardiogram (ECG) (EKG) 12/02/2020   UTI (urinary tract infection)    Thyroid  disease    Hypertension    Hyperlipidemia    HA (headache)    GERD (gastroesophageal reflux disease)    Colon polyps    Chicken pox    Arthritis    Allergy    Morbid obesity (HCC) 11/30/2020   Retrognathia 11/30/2020   CPAP (continuous positive airway pressure) dependence 11/30/2020   OSA on CPAP 11/30/2020   CPAP use counseling 11/04/2020   Carpal tunnel syndrome, bilateral 11/04/2020   Plantar fasciitis, bilateral 11/04/2020   Torn ligament 11/04/2020   History of vein stripping 11/04/2020   Shingles 2019   PCP:  Jason Leita Repine, FNP Pharmacy:   [facility] [phone] - [place], [place] - [address] AT [facility] [address] [place] [address] Phone: [phone] Fax: [phone]     Social Drivers  of Health (SDOH) Social History: SDOH Screenings   Food Insecurity: Unknown (07/17/2023)  Housing: Low Risk  (07/17/2023)  Transportation Needs: No Transportation Needs (07/17/2023)  Utilities: Not At Risk (07/17/2023)  Alcohol Screen: Low Risk  (07/10/2023)  Depression (PHQ2-9): Low Risk  (06/18/2023)  Financial Resource Strain: Patient Declined (07/10/2023)  Physical Activity: Insufficiently Active (07/10/2023)  Social Connections: Socially Isolated (07/17/2023)  Stress: No Stress Concern Present (07/10/2023)  Tobacco Use: Medium Risk (07/18/2023)   SDOH  Interventions:     Readmission Risk Interventions     No data to display

## 2023-07-18 NOTE — Progress Notes (Signed)
 PHARMACY - PHYSICIAN COMMUNICATION CRITICAL VALUE ALERT - BLOOD CULTURE IDENTIFICATION (BCID)  Tricia Dodson is an 73 y.o. female who presented to [facility] on 07/16/2023 with a chief complaint of abdominal pain  Name of physician (or Provider) Contacted: A. Andrez  Current antibiotics: Zosyn   Changes to prescribed antibiotics recommended:  No changes  Results for orders placed or performed during the hospital encounter of 07/16/23  Blood Culture ID Panel (Reflexed) (Collected: 07/16/2023  8:14 PM)  Result Value Ref Range   Enterococcus faecalis NOT DETECTED NOT DETECTED   Enterococcus Faecium NOT DETECTED NOT DETECTED   Listeria monocytogenes NOT DETECTED NOT DETECTED   Staphylococcus species NOT DETECTED NOT DETECTED   Staphylococcus aureus (BCID) NOT DETECTED NOT DETECTED   Staphylococcus epidermidis NOT DETECTED NOT DETECTED   Staphylococcus lugdunensis NOT DETECTED NOT DETECTED   Streptococcus species DETECTED (A) NOT DETECTED   Streptococcus agalactiae NOT DETECTED NOT DETECTED   Streptococcus pneumoniae NOT DETECTED NOT DETECTED   Streptococcus pyogenes NOT DETECTED NOT DETECTED   A.calcoaceticus-baumannii NOT DETECTED NOT DETECTED   Bacteroides fragilis NOT DETECTED NOT DETECTED   Enterobacterales NOT DETECTED NOT DETECTED   Enterobacter cloacae complex NOT DETECTED NOT DETECTED   Escherichia coli NOT DETECTED NOT DETECTED   Klebsiella aerogenes NOT DETECTED NOT DETECTED   Klebsiella oxytoca NOT DETECTED NOT DETECTED   Klebsiella pneumoniae NOT DETECTED NOT DETECTED   Proteus species NOT DETECTED NOT DETECTED   Salmonella species NOT DETECTED NOT DETECTED   Serratia marcescens NOT DETECTED NOT DETECTED   Haemophilus influenzae NOT DETECTED NOT DETECTED   Neisseria meningitidis NOT DETECTED NOT DETECTED   Pseudomonas aeruginosa NOT DETECTED NOT DETECTED   Stenotrophomonas maltophilia NOT DETECTED NOT DETECTED   Candida albicans NOT DETECTED NOT DETECTED   Candida  auris NOT DETECTED NOT DETECTED   Candida glabrata NOT DETECTED NOT DETECTED   Candida krusei NOT DETECTED NOT DETECTED   Candida parapsilosis NOT DETECTED NOT DETECTED   Candida tropicalis NOT DETECTED NOT DETECTED   Cryptococcus neoformans/gattii NOT DETECTED NOT DETECTED    Clair Agent 07/18/2023  5:30 AM

## 2023-07-18 NOTE — Transfer of Care (Signed)
 Immediate Anesthesia Transfer of Care Note  Patient: Tricia Dodson  Procedure(s) Performed: LAPAROSCOPIC SUBTOTAL CHOLECYSTECTOMY WITH ICG DYE (Abdomen)  Patient Location: PACU  Anesthesia Type:General  Level of Consciousness: drowsy and patient cooperative  Airway & Oxygen Therapy: Patient Spontanous Breathing and Patient connected to nasal cannula oxygen  Post-op Assessment: Report given to RN, Post -op Vital signs reviewed and stable, and Patient moving all extremities X 4  Post vital signs: Reviewed and stable  Last Vitals:  Vitals Value Taken Time  BP 130/64 07/18/23 1024  Temp    Pulse 85 07/18/23 1029  Resp 18 07/18/23 1029  SpO2 93 % 07/18/23 1029  Vitals shown include unfiled device data.  Last Pain:  Vitals:   07/18/23 0818  TempSrc:   PainSc: 0-No pain      Patients Stated Pain Goal: 2 (07/17/23 2100)  Complications: No notable events documented.

## 2023-07-18 NOTE — Op Note (Addendum)
 Patient: Tricia Dodson MRN: [id number] DOB: 07/12/1950 Sex: female Operation/Procedure Date: 07/18/2023 Surgeons and Role:    * Polly Cordella LABOR, MD - Primary  Pre-operative Diagnoses: acute cholecystitis Postoperative Diagnoses: gangrenous cholecystitis  Procedure performed: Procedures:   * LAPAROSCOPIC SUBTOTAL CHOLECYSTECTOMY WITH ICG DYE  Anesthesia: General endotracheal anesthesia  Indications: Tricia Dodson is a 73 year old female who presented to the ED with abdominal pain that had been present for several months but worsened over the past few days.  Imaging, exam, and history were consistent with cholecystitis. Preoperatively, I discussed in detail the risks, benefits, alternatives, and potential complications. The patient understands and requests to proceed.  Operative Findings: Gangrenous cholecystitis with purulence throughout the gallbladder. Omentum and bowel were adherent to the infundibulum, making a safe total cholecystectomy prohibitive.  Subtotal cholecystectomy performed and 19Fr drain left in the subhepatic space.   Operative Narrative: The patient was positively identified and was taken to the operating room and placed supine on the operating table. A time-out was performed confirming correct patient and procedure. We also confirmed initiation of deep venous thrombosis prophylaxis and wound prophylaxis. After successful induction of general endotracheal anesthesia, the arms were carefully padded. An orogastric tube and footboard were placed. The abdomen was prepped and draped in the usual sterile surgical fashion.  We began our peritoneal access with a veress needle inserted at Palmer's point.  After aspiration showed return of air bubbles and there was a positive saline drop test, the insufflation was connected and the abdomen brought to a pressure of .  We then used an opti-view technique to place a 5mm port just to the right of midline, superior to the  umbilicus.  A laparoscope was introduced into the abdomen, and there were no signs of injury from entry.  The omentum was adherent to the abdominal wall and the gallbladder was not visible.  I put in an additional 5mm port lateral and using blunt dissection was able to free the omentum from the abdominal wall.  There was a large pocket of purulence adjacent to the gallbladder consistent with perforation.  A 12mm port was placed in the subxiphoid position.  The patient was placed in the head up position and tilted slightly to the left.  I began by trying to separate the gallbladder from surrounding structures. There was omentum adherent laterally that I was able to take down but as I moved medial it appeared that their stomach or duodenum had become fixed to the gallbladder and liver.  This was not easily freed and and I wanted to avoid excess traction on these important structures. At this point I had about 1/3 of the gallbladder exposed and felt that it was unlikely that I would be able to proceed with total cholecystectomy so the decision was made for subtotal.  Starting at the infundibulum I incised  the anterior wall of the gallbladder approximately 1/3 of its length.  I excised this, giving me access to the inside of the gallbladder.  It was filled with pus and a few stones. I retrieved the stones and irrigated the gallbladder to remove the pus.  There was no free flow of bile evident.  I irrigated the subhepatic space with sterile saline and the effluent ran clear.  The operative field appeared hemostatic. The specimen was placed in a retrieval bag and removed through the subxiphoid port.  A 19Fr drain was brought out the lateral port and placed within the subhepatic space and gallbladder. This was secured to  the skin using nylon. The 12mm port site was closed using an 0-vicryl on a suture passer.We placed 0.25% Marcaine  with epinephrine  at each incision site for local anesthesia. The skin was closed using 4-0  Monocryl subcuticular suture. Dermabond was applied. The patient tolerated the procedure well, was extubated, and taken to the recovery room.  CASE DATA: Type of patient?: DOW CASE (Surgical Hospitalist [facility]) Status of Case? URGENT Add On Infection Present At Time Of Surgery (PATOS)?  PURULENCE   Estimated Blood Loss: 10 mL Specimens: Subtotal cholecystectomy Drains:  19 Fr drain in the subhepatic space Complications: None Condition of the patient: Good, extubated Disposition: PACU  [name] Date: 07/18/2023 Time: 10:14 AM

## 2023-07-19 ENCOUNTER — Inpatient Hospital Stay (HOSPITAL_COMMUNITY): Payer: Medicare Other

## 2023-07-19 ENCOUNTER — Encounter (HOSPITAL_COMMUNITY): Payer: Self-pay | Admitting: General Surgery

## 2023-07-19 DIAGNOSIS — K838 Other specified diseases of biliary tract: Secondary | ICD-10-CM | POA: Insufficient documentation

## 2023-07-19 DIAGNOSIS — K839 Disease of biliary tract, unspecified: Secondary | ICD-10-CM | POA: Diagnosis not present

## 2023-07-19 DIAGNOSIS — R7881 Bacteremia: Secondary | ICD-10-CM

## 2023-07-19 DIAGNOSIS — K810 Acute cholecystitis: Secondary | ICD-10-CM | POA: Diagnosis not present

## 2023-07-19 LAB — BLOOD CULTURE ID PANEL (REFLEXED) - BCID2

## 2023-07-19 LAB — HEPATIC FUNCTION PANEL
ALT: 52 U/L — ABNORMAL HIGH (ref 0–44)
AST: 43 U/L — ABNORMAL HIGH (ref 15–41)
Albumin: 2.3 g/dL — ABNORMAL LOW (ref 3.5–5.0)
Alkaline Phosphatase: 109 U/L (ref 38–126)
Bilirubin, Direct: 0.2 mg/dL (ref 0.0–0.2)
Indirect Bilirubin: 0.4 mg/dL (ref 0.3–0.9)
Total Bilirubin: 0.6 mg/dL (ref 0.0–1.2)
Total Protein: 6.6 g/dL (ref 6.5–8.1)

## 2023-07-19 LAB — CBC
HCT: 37.1 % (ref 36.0–46.0)
Hemoglobin: 11.8 g/dL — ABNORMAL LOW (ref 12.0–15.0)
MCH: 27.1 pg (ref 26.0–34.0)
MCHC: 31.8 g/dL (ref 30.0–36.0)
MCV: 85.3 fL (ref 80.0–100.0)
Platelets: 441 10*3/uL — ABNORMAL HIGH (ref 150–400)
RBC: 4.35 MIL/uL (ref 3.87–5.11)
RDW: 13.1 % (ref 11.5–15.5)
WBC: 15.9 10*3/uL — ABNORMAL HIGH (ref 4.0–10.5)
nRBC: 0 % (ref 0.0–0.2)

## 2023-07-19 LAB — ECHOCARDIOGRAM COMPLETE
AR max vel: 2.51 cm2
AV Area VTI: 2.47 cm2
AV Area mean vel: 2.32 cm2
AV Mean grad: 4 mm[Hg]
AV Peak grad: 7.2 mm[Hg]
Ao pk vel: 1.34 m/s
Area-P 1/2: 3 cm2
Calc EF: 66.2 %
Height: 67 in
MV VTI: 2.17 cm2
S' Lateral: 2.9 cm
Single Plane A2C EF: 67 %
Single Plane A4C EF: 66.8 %
Weight: 4032 [oz_av]

## 2023-07-19 LAB — BASIC METABOLIC PANEL
Anion gap: 15 (ref 5–15)
BUN: 17 mg/dL (ref 8–23)
CO2: 24 mmol/L (ref 22–32)
Calcium: 8.4 mg/dL — ABNORMAL LOW (ref 8.9–10.3)
Chloride: 100 mmol/L (ref 98–111)
Creatinine, Ser: 1.12 mg/dL — ABNORMAL HIGH (ref 0.44–1.00)
GFR, Estimated: 52 mL/min — ABNORMAL LOW (ref >60)
Glucose, Bld: 179 mg/dL — ABNORMAL HIGH (ref 70–99)
Potassium: 3.9 mmol/L (ref 3.5–5.1)
Sodium: 139 mmol/L (ref 135–145)

## 2023-07-19 LAB — SURGICAL PATHOLOGY

## 2023-07-19 MED ORDER — SODIUM CHLORIDE 0.9 % IV SOLN
3.0000 g | Freq: Four times a day (QID) | INTRAVENOUS | Status: DC
  Administered 2023-07-19 – 2023-07-27 (×32): 3 g via INTRAVENOUS
  Filled 2023-07-19 (×33): qty 8

## 2023-07-19 NOTE — Anesthesia Postprocedure Evaluation (Signed)
 Anesthesia Post Note  Patient: Tricia Dodson  Procedure(s) Performed: LAPAROSCOPIC SUBTOTAL CHOLECYSTECTOMY WITH ICG DYE (Abdomen)     Patient location during evaluation: PACU Anesthesia Type: General Level of consciousness: awake and alert Pain management: pain level controlled Vital Signs Assessment: post-procedure vital signs reviewed and stable Respiratory status: spontaneous breathing, nonlabored ventilation, respiratory function stable and patient connected to nasal cannula oxygen Cardiovascular status: blood pressure returned to baseline and stable Postop Assessment: no apparent nausea or vomiting Anesthetic complications: no   No notable events documented.  Last Vitals:  Vitals:   07/19/23 0538 07/19/23 0720  BP: 128/66 125/75  Pulse: 64 (!) 59  Resp: 18 16  Temp: 37.1 C 36.6 C  SpO2: 93% 96%    Last Pain:  Vitals:   07/19/23 0942  TempSrc:   PainSc: 8                  Lon Klippel S

## 2023-07-19 NOTE — Plan of Care (Signed)

## 2023-07-19 NOTE — Progress Notes (Signed)
 PROGRESS NOTE    Tricia Dodson  FMW:[id number] DOB: 10/15/1950 DOA: 07/16/2023 PCP: Tricia Leita Repine, FNP    Brief Narrative:  Tricia Dodson is a 73 y.o. female with medical history significant of obstructive sleep apnea, CPAP use, obesity.  Patient reports being in her usual state of health till approximately 4 days ago when she reports an abrupt onset of right upper quadrant abdominal pain with slight radiation posteriorly laterally.  This was associated with marked diminishment of appetite.  No report of fever or vomiting or diarrhea.  Patient actually has been constipated.  Patient actually presented to [facility] ER yesterday with above complaints.  White blood cell of 17.2 lipase within normal limits.  AST ALT slightly elevated at 59 and 71 respectively.  Lactic acid of 1.4.  Ultrasound abdomen showed sonographic Murphy sign compatible with acute cholecystitis.   Patient was awaiting bed placement in ER since yesterday.  Finally transferred to [facility] earlier this afternoon.  Medical evaluation is sought.   Patient continues to have right upper quadrant area abdominal pain.  Poor appetite.  But thirsty.  No fevers or rigors.  Patient otherwise reports feeling okay   Assessment and Plan:  * Acute cholecystitis -s/p subtotal GB removal by GS -drain in place -GI following in case of need for ERCP -will need a few days of abx   AKI (acute kidney injury) (HCC) -resume clears  Blood culture with strep anginosis -on abx-- may need prolonged treatment with abx    Hypertension -BP controlled   OSA - (continuous positive airway pressure) dependence -CPAP     Patient has chronic shingle pain - cw. Gabapentin   Hypothyroid - c.w. synthroid .  Obesity Estimated body mass index is 39.47 kg/m as calculated from the following:   Height as of this encounter: 5' 7 (1.702 m).   Weight as of this encounter: 114.3 kg.   DVT prophylaxis: SCDs Start:  07/17/23 1913    Code Status: Full Code Family Communication: called friend   Disposition Plan:  Level of care: Med-Surg Status is: inpt    Consultants:  GS   Subjective: hungry  Objective: Vitals:   07/18/23 2031 07/19/23 0030 07/19/23 0538 07/19/23 0720  BP: 108/74 125/68 128/66 125/75  Pulse: 76 70 64 (!) 59  Resp: 18 18 18 16   Temp: (!) 97.4 F (36.3 C) 98.7 F (37.1 C) 98.7 F (37.1 C) 97.9 F (36.6 C)  TempSrc:      SpO2: 94% 94% 93% 96%  Weight:      Height:        Intake/Output Summary (Last 24 hours) at 07/19/2023 1154 Last data filed at 07/19/2023 1100 Gross per 24 hour  Intake --  Output 350 ml  Net -350 ml   Filed Weights   07/16/23 1318 07/17/23 1418  Weight: 115 kg 114.3 kg    Examination:   General: Appearance:    Obese female in no acute distress     Lungs:      respirations unlabored  Heart:    Bradycardic.     MS:   All extremities are intact.    Neurologic:   Awake, alert       Data Reviewed: I have personally reviewed following labs and imaging studies  CBC: Recent Labs  Lab 07/16/23 1522 07/17/23 1952 07/18/23 0548 07/19/23 0632  WBC 17.2* 14.6* 13.6* 15.9*  NEUTROABS 13.5*  --   --   --   HGB 12.9 12.5 11.7* 11.8*  HCT 40.3 40.3 36.4 37.1  MCV 85.0 86.5 84.7 85.3  PLT 409* 414* 407* 441*   Basic Metabolic Panel: Recent Labs  Lab 07/16/23 1522 07/17/23 1952 07/18/23 0548 07/19/23 0632  NA 137  --  137 139  K 4.2  --  3.5 3.9  CL 104  --  102 100  CO2 23  --  23 24  GLUCOSE 121*  --  131* 179*  BUN 13  --  20 17  CREATININE 1.16* 1.25* 1.22* 1.12*  CALCIUM  8.9  --  8.4* 8.4*   GFR: Estimated Creatinine Clearance: 59.3 mL/min (A) (by C-G formula based on SCr of 1.12 mg/dL (H)). Liver Function Tests: Recent Labs  Lab 07/16/23 1522 07/18/23 0548 07/19/23 0854  AST 59* 28 43*  ALT 71* 43 52*  ALKPHOS 112 107 109  BILITOT 0.8 1.0 0.6  PROT 7.8 6.2* 6.6  ALBUMIN 3.3* 2.2* 2.3*   Recent Labs  Lab  07/16/23 1522  LIPASE 22   No results for input(s): AMMONIA in the last 168 hours. Coagulation Profile: Recent Labs  Lab 07/18/23 0548  INR 1.2   Cardiac Enzymes: No results for input(s): CKTOTAL, CKMB, CKMBINDEX, TROPONINI in the last 168 hours. BNP (last 3 results) No results for input(s): PROBNP in the last 8760 hours. HbA1C: No results for input(s): HGBA1C in the last 72 hours. CBG: Recent Labs  Lab 07/17/23 1422 07/18/23 0755 07/18/23 1024  GLUCAP 128* 125* 127*   Lipid Profile: No results for input(s): CHOL, HDL, LDLCALC, TRIG, CHOLHDL, LDLDIRECT in the last 72 hours. Thyroid  Function Tests: No results for input(s): TSH, T4TOTAL, FREET4, T3FREE, THYROIDAB in the last 72 hours. Anemia Panel: No results for input(s): VITAMINB12, FOLATE, FERRITIN, TIBC, IRON, RETICCTPCT in the last 72 hours. Sepsis Labs: Recent Labs  Lab 07/16/23 2015  LATICACIDVEN 1.4    Recent Results (from the past 240 hours)  Blood culture (routine x 2)     Status: Abnormal (Preliminary result)   Collection Time: 07/16/23  8:14 PM   Specimen: Right Antecubital; Blood  Result Value Ref Range Status   Specimen Description   Final    RIGHT ANTECUBITAL Performed at [facility], [address] [place], [place] [address]    Special Requests   Final    BOTTLES DRAWN AEROBIC AND ANAEROBIC Blood Culture adequate volume Performed at [facility], [address] [place], [place] [address]    Culture  Setup Time   Final    GRAM POSITIVE COCCI AEROBIC BOTTLE ONLY CRITICAL RESULT CALLED TO, READ BACK BY AND VERIFIED WITH: PHARMD J LEDFORD 07/18/2023 @ 0151 BY AB    Culture (A)  Final    STREPTOCOCCUS ANGINOSIS SUSCEPTIBILITIES TO FOLLOW Performed at [facility], [address] [place], [place] [address]    Report Status PENDING  Incomplete  Blood culture (routine x 2)     Status: None (Preliminary result)    Collection Time: 07/16/23  8:14 PM   Specimen: Left Antecubital; Blood  Result Value Ref Range Status   Specimen Description   Final    LEFT ANTECUBITAL Performed at [facility], [address] [place], [place] [address]    Special Requests   Final    BOTTLES DRAWN AEROBIC AND ANAEROBIC Blood Culture adequate volume Performed at [facility], [address], [place], [place] [address]    Culture  Setup Time   Final  GRAM POSITIVE COCCI AEROBIC BOTTLE ONLY CRITICAL RESULT CALLED TO, READ BACK BY AND VERIFIED WITH: PHARMD ANDREW MEYER ON 07/18/23 @ 2021 BY DRT Performed at [facility], [address] [place], [place] [address]    Culture GRAM POSITIVE COCCI  Final   Report Status PENDING  Incomplete  Blood Culture ID Panel (Reflexed)     Status: Abnormal   Collection Time: 07/16/23  8:14 PM  Result Value Ref Range Status   Enterococcus faecalis NOT DETECTED NOT DETECTED Final   Enterococcus Faecium NOT DETECTED NOT DETECTED Final   Listeria monocytogenes NOT DETECTED NOT DETECTED Final   Staphylococcus species NOT DETECTED NOT DETECTED Final   Staphylococcus aureus (BCID) NOT DETECTED NOT DETECTED Final   Staphylococcus epidermidis NOT DETECTED NOT DETECTED Final   Staphylococcus lugdunensis NOT DETECTED NOT DETECTED Final   Streptococcus species DETECTED (A) NOT DETECTED Final    Comment: Not Enterococcus species, Streptococcus agalactiae, Streptococcus pyogenes, or Streptococcus pneumoniae. CRITICAL RESULT CALLED TO, READ BACK BY AND VERIFIED WITH: PHARMD J LEDFORD 07/18/2023 @ 0151 BY AB    Streptococcus agalactiae NOT DETECTED NOT DETECTED Final   Streptococcus pneumoniae NOT DETECTED NOT DETECTED Final   Streptococcus pyogenes NOT DETECTED NOT DETECTED Final   A.calcoaceticus-baumannii NOT DETECTED NOT DETECTED Final   Bacteroides fragilis NOT DETECTED NOT DETECTED Final   Enterobacterales NOT DETECTED NOT DETECTED Final   Enterobacter cloacae  complex NOT DETECTED NOT DETECTED Final   Escherichia coli NOT DETECTED NOT DETECTED Final   Klebsiella aerogenes NOT DETECTED NOT DETECTED Final   Klebsiella oxytoca NOT DETECTED NOT DETECTED Final   Klebsiella pneumoniae NOT DETECTED NOT DETECTED Final   Proteus species NOT DETECTED NOT DETECTED Final   Salmonella species NOT DETECTED NOT DETECTED Final   Serratia marcescens NOT DETECTED NOT DETECTED Final   Haemophilus influenzae NOT DETECTED NOT DETECTED Final   Neisseria meningitidis NOT DETECTED NOT DETECTED Final   Pseudomonas aeruginosa NOT DETECTED NOT DETECTED Final   Stenotrophomonas maltophilia NOT DETECTED NOT DETECTED Final   Candida albicans NOT DETECTED NOT DETECTED Final   Candida auris NOT DETECTED NOT DETECTED Final   Candida glabrata NOT DETECTED NOT DETECTED Final   Candida krusei NOT DETECTED NOT DETECTED Final   Candida parapsilosis NOT DETECTED NOT DETECTED Final   Candida tropicalis NOT DETECTED NOT DETECTED Final   Cryptococcus neoformans/gattii NOT DETECTED NOT DETECTED Final    Comment: Performed at [facility], [address] [place], [place] [address]  Surgical pcr screen     Status: Abnormal   Collection Time: 07/18/23  3:44 AM   Specimen: Nasal Mucosa; Nasal Swab  Result Value Ref Range Status   MRSA, PCR NEGATIVE NEGATIVE Final   Staphylococcus aureus POSITIVE (A) NEGATIVE Final    Comment: (NOTE) The Xpert SA Assay (FDA approved for NASAL specimens in patients 40 years of age and older), is one component of a comprehensive surveillance program. It is not intended to diagnose infection nor to guide or monitor treatment. Performed at [facility], [address] [place], [place] [address]   Blood Culture ID Panel (Reflexed)     Status: Abnormal   Collection Time: 07/18/23  8:14 PM  Result Value Ref Range Status   Enterococcus faecalis NOT DETECTED NOT DETECTED Final   Enterococcus Faecium NOT DETECTED NOT DETECTED Final   Listeria  monocytogenes NOT DETECTED NOT DETECTED Final   Staphylococcus species DETECTED (A) NOT DETECTED Final    Comment: CRITICAL RESULT  CALLED TO, READ BACK BY AND VERIFIED WITH: PHARMD ANDREW MEYER ON 07/18/23 @ 2038 BY DRT    Staphylococcus aureus (BCID) NOT DETECTED NOT DETECTED Final   Staphylococcus epidermidis NOT DETECTED NOT DETECTED Final   Staphylococcus lugdunensis NOT DETECTED NOT DETECTED Final   Streptococcus species NOT DETECTED NOT DETECTED Final   Streptococcus agalactiae NOT DETECTED NOT DETECTED Corrected    Comment: CORRECTED ON 01/10 AT 0815: PREVIOUSLY REPORTED AS NOT DETECTED CRITICAL RESULT CALLED TO, READ BACK BY AND VERIFIED WITH: PHARMD ANDREW MEYER ON 07/18/23 @ 2038 BY DRT   Streptococcus pneumoniae NOT DETECTED NOT DETECTED Final   Streptococcus pyogenes NOT DETECTED NOT DETECTED Final   A.calcoaceticus-baumannii NOT DETECTED NOT DETECTED Final   Bacteroides fragilis NOT DETECTED NOT DETECTED Final   Enterobacterales NOT DETECTED NOT DETECTED Final   Enterobacter cloacae complex NOT DETECTED NOT DETECTED Final   Escherichia coli NOT DETECTED NOT DETECTED Final   Klebsiella aerogenes NOT DETECTED NOT DETECTED Final   Klebsiella oxytoca NOT DETECTED NOT DETECTED Final   Klebsiella pneumoniae NOT DETECTED NOT DETECTED Final   Proteus species NOT DETECTED NOT DETECTED Final   Salmonella species NOT DETECTED NOT DETECTED Final   Serratia marcescens NOT DETECTED NOT DETECTED Final   Haemophilus influenzae NOT DETECTED NOT DETECTED Final   Neisseria meningitidis NOT DETECTED NOT DETECTED Final   Pseudomonas aeruginosa NOT DETECTED NOT DETECTED Final   Stenotrophomonas maltophilia NOT DETECTED NOT DETECTED Final   Candida albicans NOT DETECTED NOT DETECTED Final   Candida auris NOT DETECTED NOT DETECTED Final   Candida glabrata NOT DETECTED NOT DETECTED Final   Candida krusei NOT DETECTED NOT DETECTED Final   Candida parapsilosis NOT DETECTED NOT DETECTED Final   Candida  tropicalis NOT DETECTED NOT DETECTED Final   Cryptococcus neoformans/gattii NOT DETECTED NOT DETECTED Final    Comment: Performed at [facility], [address], [place], [place] [address]         Radiology Studies: MR ABDOMEN MRCP WO CONTRAST Result Date: 07/18/2023 CLINICAL DATA:  Gallstones with findings of cholecystitis on ultrasound. EXAM: MRI ABDOMEN WITHOUT CONTRAST  (INCLUDING MRCP) TECHNIQUE: Multiplanar multisequence MR imaging of the abdomen was performed. Heavily T2-weighted images of the biliary and pancreatic ducts were obtained, and three-dimensional MRCP images were rendered by post processing. COMPARISON:  Ultrasound and CT scans from 07/16/2023 FINDINGS: Lower chest: Trace right pleural effusion. Hepatobiliary: Motion degraded exam shows no focal suspicious abnormality within the liver parenchyma. Trace intrahepatic biliary duct prominence. Gallbladder wall thickening evident with pericholecystic edema/inflammation. Gallbladder is distended with layering sludge and stones. Adjacent stones are seen in the neck of the gallbladder, potentially just into the proximal cystic duct (see coronal T2 images 28 in 29 of series 3 and axial T2 images 23 and 24 of series 4). Common duct is 8 mm diameter in the porta hepatis region. Common bile duct in the head of the pancreas measures 7 mm diameter. No definite stone is identified in the common bile duct. Axial T2 single shot fast spin echo imaging shows a central linear hypointense band in the distal common bile duct that is not present on the axial T2 fat suppressed imaging nor coronal single-shot and MRCP imaging. As such, this is felt to be more consistent with flow artifact than a true ductal stone. Pancreas: Pancreas is diffusely atrophic without main duct dilatation. Spleen:  No splenomegaly. No suspicious focal mass lesion. Adrenals/Urinary Tract: No adrenal nodule or mass. Kidneys unremarkable. Stomach/Bowel: Stomach is unremarkable.  No  gastric wall thickening. No evidence of outlet obstruction. Duodenum is normally positioned as is the ligament of Treitz. Duodenal diverticulum noted. No small bowel or colonic dilatation within the visualized abdomen. Vascular/Lymphatic: No abdominal aortic aneurysm. No abdominal lymphadenopathy. Other:  No intraperitoneal free fluid. Musculoskeletal: No worrisome lytic or sclerotic osseous abnormality. IMPRESSION: 1. Gallbladder wall thickening with pericholecystic edema/inflammation. Gallbladder is distended with layering sludge and stones. Adjacent stones are seen in the neck of the gallbladder, potentially just into the proximal cystic duct. Imaging features compatible with acute cholecystitis. 2. Common bile duct is 8 mm diameter in the porta hepatis region. Common bile duct in the head of the pancreas measures 7 mm diameter. No definite stone is identified in the common bile duct. Axial T2 single shot fast spin echo imaging shows a central linear hypointense band in the distal common bile duct that is not present on the axial T2 fat suppressed imaging nor coronal single-shot and MRCP imaging. As such, this is felt to be more consistent with flow artifact than a true ductal stone although a tiny distal common bile duct stone is not excluded. 3. Trace right pleural effusion. Electronically Signed   By: Camellia Candle M.D.   On: 07/18/2023 06:24   MR 3D Recon At Scanner Result Date: 07/18/2023 CLINICAL DATA:  Gallstones with findings of cholecystitis on ultrasound. EXAM: MRI ABDOMEN WITHOUT CONTRAST  (INCLUDING MRCP) TECHNIQUE: Multiplanar multisequence MR imaging of the abdomen was performed. Heavily T2-weighted images of the biliary and pancreatic ducts were obtained, and three-dimensional MRCP images were rendered by post processing. COMPARISON:  Ultrasound and CT scans from 07/16/2023 FINDINGS: Lower chest: Trace right pleural effusion. Hepatobiliary: Motion degraded exam shows no focal suspicious  abnormality within the liver parenchyma. Trace intrahepatic biliary duct prominence. Gallbladder wall thickening evident with pericholecystic edema/inflammation. Gallbladder is distended with layering sludge and stones. Adjacent stones are seen in the neck of the gallbladder, potentially just into the proximal cystic duct (see coronal T2 images 28 in 29 of series 3 and axial T2 images 23 and 24 of series 4). Common duct is 8 mm diameter in the porta hepatis region. Common bile duct in the head of the pancreas measures 7 mm diameter. No definite stone is identified in the common bile duct. Axial T2 single shot fast spin echo imaging shows a central linear hypointense band in the distal common bile duct that is not present on the axial T2 fat suppressed imaging nor coronal single-shot and MRCP imaging. As such, this is felt to be more consistent with flow artifact than a true ductal stone. Pancreas: Pancreas is diffusely atrophic without main duct dilatation. Spleen:  No splenomegaly. No suspicious focal mass lesion. Adrenals/Urinary Tract: No adrenal nodule or mass. Kidneys unremarkable. Stomach/Bowel: Stomach is unremarkable. No gastric wall thickening. No evidence of outlet obstruction. Duodenum is normally positioned as is the ligament of Treitz. Duodenal diverticulum noted. No small bowel or colonic dilatation within the visualized abdomen. Vascular/Lymphatic: No abdominal aortic aneurysm. No abdominal lymphadenopathy. Other:  No intraperitoneal free fluid. Musculoskeletal: No worrisome lytic or sclerotic osseous abnormality. IMPRESSION: 1. Gallbladder wall thickening with pericholecystic edema/inflammation. Gallbladder is distended with layering sludge and stones. Adjacent stones are seen in the neck of the gallbladder, potentially just into the proximal cystic duct. Imaging features compatible with acute cholecystitis. 2. Common bile duct is 8 mm diameter in the porta hepatis region. Common bile duct in the  head of the pancreas measures 7 mm diameter. No definite stone  is identified in the common bile duct. Axial T2 single shot fast spin echo imaging shows a central linear hypointense band in the distal common bile duct that is not present on the axial T2 fat suppressed imaging nor coronal single-shot and MRCP imaging. As such, this is felt to be more consistent with flow artifact than a true ductal stone although a tiny distal common bile duct stone is not excluded. 3. Trace right pleural effusion. Electronically Signed   By: Camellia Candle M.D.   On: 07/18/2023 06:24        Scheduled Meds:  acetaminophen   650 mg Oral Q4H   amitriptyline   10 mg Oral QHS   atorvastatin   10 mg Oral Daily   Chlorhexidine  Gluconate Cloth  6 each Topical Daily   DULoxetine   20 mg Oral Daily   enoxaparin  (LOVENOX ) injection  55 mg Subcutaneous QHS   gabapentin   300 mg Oral BID   levothyroxine   25 mcg Oral Q0600   mupirocin  ointment  1 Application Nasal BID   pantoprazole   40 mg Oral Daily   senna-docusate  2 tablet Oral BID   sodium chloride  flush  3 mL Intravenous Q12H   Continuous Infusions:  piperacillin -tazobactam (ZOSYN )  IV 3.375 g (07/19/23 0954)     LOS: 1 day    Time spent: 45 minutes spent on chart review, discussion with nursing staff, consultants, updating family and interview/physical exam; more than 50% of that time was spent in counseling and/or coordination of care.    Harlene RAYMOND Bowl, DO [facility] Available via Epic secure chat 7am-7pm After these hours, please refer to coverage provider listed on [email or web address] 07/19/2023, 11:54 AM

## 2023-07-19 NOTE — Consult Note (Addendum)
 Consultation  Referring Provider:  TRH  Primary Care Physician:  Jason Leita Repine, FNP Primary Gastroenterologist:  [facility]       Reason for Consultation:    post op Bile leak  LOS: 1 day          HPI:   Tricia Dodson is a 73 y.o. female with past medical history significant for OSA on CPAP and obesity presents for evaluation of bile leak.  Presented to ED 1/7 with abrupt onset RUQ pain.  Upon admission patient was found to have leukocytosis with WBC 17.2.  AST 59, ALT 71.  Ultrasound of her abdomen showed sonographic Murphy sign compatible with acute cholecystitis. MRCP confirmed acute cholecystitis and showed CBD (8mm) without obvious stone.  Patient was found to have acute gangrenous cholecystitis.  Underwent subtotal cholecystectomy 1/9 with Dr. Polly.  JP drain has had bilious 400 mL output since surgery and GI has been consulted for postop bile leak.  Patient notes continued abdominal pain mainly around drain site. States she feels it is no worse than yesterday but is exacerbated when she tries to move around. Feels better than the pain she presented to ED with.  Past Medical History:  Diagnosis Date   Allergy    Arthritis    Carpal tunnel syndrome, bilateral 11/04/2020   Both wrists.    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   Shingles 2019   Thyroid  disease    Torn ligament 11/04/2020   Right knee   UTI (urinary tract infection)     Surgical History:  She  has a past surgical history that includes right hand  cyst removal; Bilateral carpal tunnel release; Plantar fasciatis ; Hemorroids removed; Knee arthroscopy (Right); vein vascular; and Dilation and curettage of uterus. Family History:  Her family history includes Arthritis in her mother and sister; Diabetes in her father and sister; Heart disease in her brother and father; Hyperlipidemia in her brother and sister; Sleep apnea in her brother. Social History:   reports that she has quit smoking. She has never used smokeless tobacco. She reports current alcohol use. No history on file for drug use.  Prior to Admission medications   Medication Sig Start Date End Date Taking? Authorizing Provider  amitriptyline  (ELAVIL ) 10 MG tablet Take 1 tablet (10 mg total) by mouth at bedtime. 06/18/23  Yes Jason Leita Repine, FNP  atorvastatin  (LIPITOR) 10 MG tablet Take 1 tablet (10 mg total) by mouth daily. Patient taking differently: Take 10 mg by mouth at bedtime. 06/18/23  Yes Jason Leita Repine, FNP  cyclobenzaprine  (FLEXERIL ) 10 MG tablet Take 0.5-1 tablets (5-10 mg total) by mouth 3 (three) times daily as needed. 07/08/23  Yes Vivienne Delon HERO, PA-C  DULoxetine  (CYMBALTA ) 20 MG capsule Take 1 capsule (20 mg total) by mouth daily. 06/18/23  Yes Jason Leita Repine, FNP  empagliflozin  (JARDIANCE ) 10 MG TABS tablet Take 1 tablet (10 mg total) by mouth daily. 06/18/23  Yes Jason Leita Repine,  FNP  fenofibrate  micronized (LOFIBRA) 134 MG capsule Take 1 capsule (134 mg total) by mouth daily before breakfast. 06/18/23  Yes Jason Leita Repine, FNP  gabapentin  (NEURONTIN ) 300 MG capsule Take 1 capsule (300 mg total) by mouth 2 (two) times daily. 06/18/23  Yes Jason Leita Repine, FNP  ipratropium (ATROVENT ) 0.03 % nasal spray Place 2 sprays into both nostrils every 12 (twelve) hours. Patient taking differently: Place 2 sprays into both nostrils as needed for rhinitis. 05/01/22  Yes Jason Leita Repine, FNP  levocetirizine (XYZAL ) 5 MG  tablet Take 1 tablet (5 mg total) by mouth every evening. 06/18/23  Yes Jason Leita Repine, FNP  levothyroxine  (SYNTHROID ) 25 MCG tablet TAKE 1 TABLET(25 MCG) BY MOUTH DAILY 06/18/23  Yes Jason Leita Repine, FNP  losartan  (COZAAR ) 50 MG tablet Take 50 mg by mouth every evening. 04/25/21  Yes [provider]  naproxen  (NAPROSYN ) 500 MG tablet Take 1 tablet (500 mg total) by mouth 2 (two) times daily with a meal. 07/08/23  Yes Burnette, Jennifer M, PA-C  omeprazole  (PRILOSEC) 40 MG capsule TAKE 1 CAPSULE(40 MG) BY MOUTH TWICE DAILY Patient taking differently: Take 40 mg by mouth in the morning. 08/08/22  Yes Jason Leita Repine, FNP    Current Facility-Administered Medications  Medication Dose Route Frequency Provider Last Rate Last Admin   acetaminophen  (TYLENOL ) tablet 650 mg  650 mg Oral Q4H Goel, Hersh, MD   650 mg at 07/19/23 0443   amitriptyline  (ELAVIL ) tablet 10 mg  10 mg Oral QHS Haviland, Julie, MD   10 mg at 07/18/23 2035   atorvastatin  (LIPITOR) tablet 10 mg  10 mg Oral Daily Haviland, Julie, MD   10 mg at 07/17/23 9066   Chlorhexidine  Gluconate Cloth 2 % PADS 6 each  6 each Topical Daily Vann, Jessica U, DO   6 each at 07/18/23 2045   DULoxetine  (CYMBALTA ) DR capsule 20 mg  20 mg Oral Daily Haviland, Julie, MD       enoxaparin  (LOVENOX ) injection 55 mg  55 mg Subcutaneous QHS Moody Alto, MD   55 mg at 07/18/23 2036   gabapentin  (NEURONTIN ) capsule 300 mg  300 mg Oral BID Goel, Hersh, MD   300 mg at 07/18/23 2035   HYDROmorphone  (DILAUDID ) injection 0.5 mg  0.5 mg Intravenous Q2H PRN Moody Alto, MD   0.5 mg at 07/18/23 1629   ipratropium-albuterol  (DUONEB) 0.5-2.5 (3) MG/3ML nebulizer solution 3 mL  3 mL Nebulization Q4H PRN Moody Alto, MD       levothyroxine  (SYNTHROID ) tablet 25 mcg  25 mcg Oral Q0600 Haviland, Julie, MD   25 mcg at 07/19/23 0443   mupirocin  ointment (BACTROBAN ) 2 % 1 Application  1 Application Nasal BID Vann, Jessica U, DO   1 Application at  07/18/23 2037   oxyCODONE  (Oxy IR/ROXICODONE ) immediate release tablet 5 mg  5 mg Oral Q4H PRN Goel, Hersh, MD   5 mg at 07/19/23 0023   pantoprazole  (PROTONIX ) EC tablet 40 mg  40 mg Oral Daily Goel, Hersh, MD   40 mg at 07/17/23 1956   piperacillin -tazobactam (ZOSYN ) IVPB 3.375 g  3.375 g Intravenous Q8H Haviland, Julie, MD 12.5 mL/hr at 07/19/23 0025 3.375 g at 07/19/23 0025   polyethylene glycol (MIRALAX  / GLYCOLAX ) packet 17 g  17 g Oral Daily PRN Moody Alto, MD       senna-docusate (Senokot-S) tablet 2 tablet  2 tablet Oral BID Goel, Hersh, MD   2 tablet at 07/18/23 2036   sodium  chloride flush (NS) 0.9 % injection 3 mL  3 mL Intravenous Q12H Moody Alto, MD   3 mL at 07/18/23 2044    Allergies as of 07/16/2023   (No Known Allergies)    Review of Systems  Constitutional:  Negative for chills, fever and weight loss.  HENT:  Negative for hearing loss and tinnitus.   Eyes:  Negative for blurred vision and double vision.  Respiratory:  Negative for cough and hemoptysis.   Cardiovascular:  Negative for chest pain and palpitations.  Gastrointestinal:  Positive for abdominal pain. Negative for blood in stool, constipation, diarrhea, heartburn, melena, nausea and vomiting.  Genitourinary:  Negative for dysuria and urgency.  Musculoskeletal:  Negative for myalgias and neck pain.  Skin:  Negative for itching and rash.  Neurological:  Negative for seizures.  Psychiatric/Behavioral:  Negative for depression and suicidal ideas.        Physical Exam:  Vital signs in last 24 hours: Temp:  [97.4 F (36.3 C)-98.7 F (37.1 C)] 97.9 F (36.6 C) (01/10 0720) Pulse Rate:  [59-92] 59 (01/10 0720) Resp:  [14-19] 16 (01/10 0720) BP: (108-136)/(64-75) 125/75 (01/10 0720) SpO2:  [87 %-96 %] 96 % (01/10 0720)   Last BM recorded by nurses in past 5 days No data recorded  Physical Exam Constitutional:      Appearance: Normal appearance. She is not ill-appearing.  HENT:     Head: Normocephalic  and atraumatic.     Nose: Nose normal. No congestion.  Eyes:     Extraocular Movements: Extraocular movements intact.     Conjunctiva/sclera: Conjunctivae normal.  Cardiovascular:     Rate and Rhythm: Normal rate and regular rhythm.  Pulmonary:     Effort: Pulmonary effort is normal. No respiratory distress.  Abdominal:     Palpations: Abdomen is soft.     Comments: Generalized tenderness especially around drain. Drain emptied 10 minutes prior to my arrival, residual blood tinged fluid in drain  Musculoskeletal:        General: No swelling. Normal range of motion.     Cervical back: Normal range of motion and neck supple.  Skin:    General: Skin is warm and dry.  Neurological:     General: No focal deficit present.     Mental Status: She is oriented to person, place, and time.  Psychiatric:        Mood and Affect: Mood normal.        Behavior: Behavior normal.        Thought Content: Thought content normal.        Judgment: Judgment normal.      LAB RESULTS: Recent Labs    07/17/23 1952 07/18/23 0548 07/19/23 0632  WBC 14.6* 13.6* 15.9*  HGB 12.5 11.7* 11.8*  HCT 40.3 36.4 37.1  PLT 414* 407* 441*   BMET Recent Labs    07/16/23 1522 07/17/23 1952 07/18/23 0548 07/19/23 0632  NA 137  --  137 139  K 4.2  --  3.5 3.9  CL 104  --  102 100  CO2 23  --  23 24  GLUCOSE 121*  --  131* 179*  BUN 13  --  20 17  CREATININE 1.16* 1.25* 1.22* 1.12*  CALCIUM  8.9  --  8.4* 8.4*   LFT Recent Labs    07/18/23 0548  PROT 6.2*  ALBUMIN 2.2*  AST 28  ALT 43  ALKPHOS 107  BILITOT 1.0  BILIDIR 0.3*  IBILI 0.7   PT/INR  Recent Labs    07/18/23 0548  LABPROT 15.4*  INR 1.2    STUDIES: MR ABDOMEN MRCP WO CONTRAST Result Date: 07/18/2023 CLINICAL DATA:  Gallstones with findings of cholecystitis on ultrasound. EXAM: MRI ABDOMEN WITHOUT CONTRAST  (INCLUDING MRCP) TECHNIQUE: Multiplanar multisequence MR imaging of the abdomen was performed. Heavily T2-weighted images of  the biliary and pancreatic ducts were obtained, and three-dimensional MRCP images were rendered by post processing. COMPARISON:  Ultrasound and CT scans from 07/16/2023 FINDINGS: Lower chest: Trace right pleural effusion. Hepatobiliary: Motion degraded exam shows no focal suspicious abnormality within the liver parenchyma. Trace intrahepatic biliary duct prominence. Gallbladder wall thickening evident with pericholecystic edema/inflammation. Gallbladder is distended with layering sludge and stones. Adjacent stones are seen in the neck of the gallbladder, potentially just into the proximal cystic duct (see coronal T2 images 28 in 29 of series 3 and axial T2 images 23 and 24 of series 4). Common duct is 8 mm diameter in the porta hepatis region. Common bile duct in the head of the pancreas measures 7 mm diameter. No definite stone is identified in the common bile duct. Axial T2 single shot fast spin echo imaging shows a central linear hypointense band in the distal common bile duct that is not present on the axial T2 fat suppressed imaging nor coronal single-shot and MRCP imaging. As such, this is felt to be more consistent with flow artifact than a true ductal stone. Pancreas: Pancreas is diffusely atrophic without main duct dilatation. Spleen:  No splenomegaly. No suspicious focal mass lesion. Adrenals/Urinary Tract: No adrenal nodule or mass. Kidneys unremarkable. Stomach/Bowel: Stomach is unremarkable. No gastric wall thickening. No evidence of outlet obstruction. Duodenum is normally positioned as is the ligament of Treitz. Duodenal diverticulum noted. No small bowel or colonic dilatation within the visualized abdomen. Vascular/Lymphatic: No abdominal aortic aneurysm. No abdominal lymphadenopathy. Other:  No intraperitoneal free fluid. Musculoskeletal: No worrisome lytic or sclerotic osseous abnormality. IMPRESSION: 1. Gallbladder wall thickening with pericholecystic edema/inflammation. Gallbladder is distended  with layering sludge and stones. Adjacent stones are seen in the neck of the gallbladder, potentially just into the proximal cystic duct. Imaging features compatible with acute cholecystitis. 2. Common bile duct is 8 mm diameter in the porta hepatis region. Common bile duct in the head of the pancreas measures 7 mm diameter. No definite stone is identified in the common bile duct. Axial T2 single shot fast spin echo imaging shows a central linear hypointense band in the distal common bile duct that is not present on the axial T2 fat suppressed imaging nor coronal single-shot and MRCP imaging. As such, this is felt to be more consistent with flow artifact than a true ductal stone although a tiny distal common bile duct stone is not excluded. 3. Trace right pleural effusion. Electronically Signed   By: Camellia Candle M.D.   On: 07/18/2023 06:24   MR 3D Recon At Scanner Result Date: 07/18/2023 CLINICAL DATA:  Gallstones with findings of cholecystitis on ultrasound. EXAM: MRI ABDOMEN WITHOUT CONTRAST  (INCLUDING MRCP) TECHNIQUE: Multiplanar multisequence MR imaging of the abdomen was performed. Heavily T2-weighted images of the biliary and pancreatic ducts were obtained, and three-dimensional MRCP images were rendered by post processing. COMPARISON:  Ultrasound and CT scans from 07/16/2023 FINDINGS: Lower chest: Trace right pleural effusion. Hepatobiliary: Motion degraded exam shows no focal suspicious abnormality within the liver parenchyma. Trace intrahepatic biliary duct prominence. Gallbladder wall thickening evident with pericholecystic edema/inflammation. Gallbladder is distended with layering sludge and stones. Adjacent stones  are seen in the neck of the gallbladder, potentially just into the proximal cystic duct (see coronal T2 images 28 in 29 of series 3 and axial T2 images 23 and 24 of series 4). Common duct is 8 mm diameter in the porta hepatis region. Common bile duct in the head of the pancreas measures 7  mm diameter. No definite stone is identified in the common bile duct. Axial T2 single shot fast spin echo imaging shows a central linear hypointense band in the distal common bile duct that is not present on the axial T2 fat suppressed imaging nor coronal single-shot and MRCP imaging. As such, this is felt to be more consistent with flow artifact than a true ductal stone. Pancreas: Pancreas is diffusely atrophic without main duct dilatation. Spleen:  No splenomegaly. No suspicious focal mass lesion. Adrenals/Urinary Tract: No adrenal nodule or mass. Kidneys unremarkable. Stomach/Bowel: Stomach is unremarkable. No gastric wall thickening. No evidence of outlet obstruction. Duodenum is normally positioned as is the ligament of Treitz. Duodenal diverticulum noted. No small bowel or colonic dilatation within the visualized abdomen. Vascular/Lymphatic: No abdominal aortic aneurysm. No abdominal lymphadenopathy. Other:  No intraperitoneal free fluid. Musculoskeletal: No worrisome lytic or sclerotic osseous abnormality. IMPRESSION: 1. Gallbladder wall thickening with pericholecystic edema/inflammation. Gallbladder is distended with layering sludge and stones. Adjacent stones are seen in the neck of the gallbladder, potentially just into the proximal cystic duct. Imaging features compatible with acute cholecystitis. 2. Common bile duct is 8 mm diameter in the porta hepatis region. Common bile duct in the head of the pancreas measures 7 mm diameter. No definite stone is identified in the common bile duct. Axial T2 single shot fast spin echo imaging shows a central linear hypointense band in the distal common bile duct that is not present on the axial T2 fat suppressed imaging nor coronal single-shot and MRCP imaging. As such, this is felt to be more consistent with flow artifact than a true ductal stone although a tiny distal common bile duct stone is not excluded. 3. Trace right pleural effusion. Electronically Signed   By:  Camellia Candle M.D.   On: 07/18/2023 06:24      Impression/Plan   Acute cholecystitis s/p subtotal cholecystectomy 1/9 Postop bile leak 400 mL JP drain output 1 day postop Worsening leukocytosis with WBC 15.9 Stable renal function LFTs pending -- Continue Zosyn  -- from a clinical standpoint, patient is stable. With one day post op, recommend continuing to monitor drain output. If persistent high output will consider ERCP. -- can have clear liquid diet. Any advancement past CLD per surgical team -- continue to trend LFTs -- continue to monitor drain output  Thank you for your kind consultation, we will continue to follow.   Bayley CHRISTELLA Blower  07/19/2023, 8:51 AM   GI ATTENDING  History, laboratories, x-rays all personally reviewed.  Patient personally seen and examined.  Agree with comprehensive consultation note as above.  Patient is one day postop--subtotal cholecystectomy for gangrenous cholecystitis.  Initial high-volume JP drain output at this time is noted.  Sore, but clinically stable.  On Zosyn .  Will observe drainage over the next day or 2 to see if this moves.  If not, will need ERCP with stent placement.  Discussed with general surgery.  Discussed with the patient.  Will follow.  Thank you  Norleen SAILOR. Abran Raddle., M.D. [facility] Division of Gastroenterology

## 2023-07-19 NOTE — Plan of Care (Signed)
 Patient educated concerning pain management and expected output from JP

## 2023-07-19 NOTE — Progress Notes (Signed)
 [facility] Progress Note  1 Day Post-Op  Subjective: CC:  States pain is overall improved and now more localized around the drain. Denies nausea/vomiting. Tolerated some clears post-op.  Objective: Vital signs in last 24 hours: Temp:  [97.4 F (36.3 C)-98.7 F (37.1 C)] 97.9 F (36.6 C) (01/10 0720) Pulse Rate:  [59-92] 59 (01/10 0720) Resp:  [14-19] 16 (01/10 0720) BP: (108-136)/(64-76) 125/75 (01/10 0720) SpO2:  [87 %-96 %] 96 % (01/10 0720)    Intake/Output from previous day: 01/09 0701 - 01/10 0700 In: 500 [I.V.:500] Out: 310 [Drains:300; Blood:10] Intake/Output this shift: No intake/output data recorded.  PE: Gen:  Alert, NAD, pleasant Card:  Regular rate and rhythm Pulm:  slightly labored with speech on ORA Abd: Soft, approp tender, RUQ drain with bilious effluent (300-400 mL), incisions c/d/I, no guarding or rebound Skin: warm and dry, no rashes  Psych: A&Ox3   Lab Results:  Recent Labs    07/18/23 0548 07/19/23 0632  WBC 13.6* 15.9*  HGB 11.7* 11.8*  HCT 36.4 37.1  PLT 407* 441*   BMET Recent Labs    07/18/23 0548 07/19/23 0632  NA 137 139  K 3.5 3.9  CL 102 100  CO2 23 24  GLUCOSE 131* 179*  BUN 20 17  CREATININE 1.22* 1.12*  CALCIUM  8.4* 8.4*   PT/INR Recent Labs    07/18/23 0548  LABPROT 15.4*  INR 1.2   CMP     Component Value Date/Time   NA 139 07/19/2023 0632   K 3.9 07/19/2023 0632   CL 100 07/19/2023 0632   CO2 24 07/19/2023 0632   GLUCOSE 179 (H) 07/19/2023 0632   BUN 17 07/19/2023 0632   CREATININE 1.12 (H) 07/19/2023 0632   CALCIUM  8.4 (L) 07/19/2023 0632   PROT 6.2 (L) 07/18/2023 0548   ALBUMIN 2.2 (L) 07/18/2023 0548   AST 28 07/18/2023 0548   ALT 43 07/18/2023 0548   ALKPHOS 107 07/18/2023 0548   BILITOT 1.0 07/18/2023 0548   GFRNONAA 52 (L) 07/19/2023 0632   Lipase     Component Value Date/Time   LIPASE 22 07/16/2023 1522       Studies/Results: MR ABDOMEN MRCP WO CONTRAST Result Date:  07/18/2023 CLINICAL DATA:  Gallstones with findings of cholecystitis on ultrasound. EXAM: MRI ABDOMEN WITHOUT CONTRAST  (INCLUDING MRCP) TECHNIQUE: Multiplanar multisequence MR imaging of the abdomen was performed. Heavily T2-weighted images of the biliary and pancreatic ducts were obtained, and three-dimensional MRCP images were rendered by post processing. COMPARISON:  Ultrasound and CT scans from 07/16/2023 FINDINGS: Lower chest: Trace right pleural effusion. Hepatobiliary: Motion degraded exam shows no focal suspicious abnormality within the liver parenchyma. Trace intrahepatic biliary duct prominence. Gallbladder wall thickening evident with pericholecystic edema/inflammation. Gallbladder is distended with layering sludge and stones. Adjacent stones are seen in the neck of the gallbladder, potentially just into the proximal cystic duct (see coronal T2 images 28 in 29 of series 3 and axial T2 images 23 and 24 of series 4). Common duct is 8 mm diameter in the porta hepatis region. Common bile duct in the head of the pancreas measures 7 mm diameter. No definite stone is identified in the common bile duct. Axial T2 single shot fast spin echo imaging shows a central linear hypointense band in the distal common bile duct that is not present on the axial T2 fat suppressed imaging nor coronal single-shot and MRCP imaging. As such, this is felt to be more consistent with flow artifact than a  true ductal stone. Pancreas: Pancreas is diffusely atrophic without main duct dilatation. Spleen:  No splenomegaly. No suspicious focal mass lesion. Adrenals/Urinary Tract: No adrenal nodule or mass. Kidneys unremarkable. Stomach/Bowel: Stomach is unremarkable. No gastric wall thickening. No evidence of outlet obstruction. Duodenum is normally positioned as is the ligament of Treitz. Duodenal diverticulum noted. No small bowel or colonic dilatation within the visualized abdomen. Vascular/Lymphatic: No abdominal aortic aneurysm. No  abdominal lymphadenopathy. Other:  No intraperitoneal free fluid. Musculoskeletal: No worrisome lytic or sclerotic osseous abnormality. IMPRESSION: 1. Gallbladder wall thickening with pericholecystic edema/inflammation. Gallbladder is distended with layering sludge and stones. Adjacent stones are seen in the neck of the gallbladder, potentially just into the proximal cystic duct. Imaging features compatible with acute cholecystitis. 2. Common bile duct is 8 mm diameter in the porta hepatis region. Common bile duct in the head of the pancreas measures 7 mm diameter. No definite stone is identified in the common bile duct. Axial T2 single shot fast spin echo imaging shows a central linear hypointense band in the distal common bile duct that is not present on the axial T2 fat suppressed imaging nor coronal single-shot and MRCP imaging. As such, this is felt to be more consistent with flow artifact than a true ductal stone although a tiny distal common bile duct stone is not excluded. 3. Trace right pleural effusion. Electronically Signed   By: Tricia Dodson M.D.   On: 07/18/2023 06:24   MR 3D Recon At Scanner Result Date: 07/18/2023 CLINICAL DATA:  Gallstones with findings of cholecystitis on ultrasound. EXAM: MRI ABDOMEN WITHOUT CONTRAST  (INCLUDING MRCP) TECHNIQUE: Multiplanar multisequence MR imaging of the abdomen was performed. Heavily T2-weighted images of the biliary and pancreatic ducts were obtained, and three-dimensional MRCP images were rendered by post processing. COMPARISON:  Ultrasound and CT scans from 07/16/2023 FINDINGS: Lower chest: Trace right pleural effusion. Hepatobiliary: Motion degraded exam shows no focal suspicious abnormality within the liver parenchyma. Trace intrahepatic biliary duct prominence. Gallbladder wall thickening evident with pericholecystic edema/inflammation. Gallbladder is distended with layering sludge and stones. Adjacent stones are seen in the neck of the gallbladder,  potentially just into the proximal cystic duct (see coronal T2 images 28 in 29 of series 3 and axial T2 images 23 and 24 of series 4). Common duct is 8 mm diameter in the porta hepatis region. Common bile duct in the head of the pancreas measures 7 mm diameter. No definite stone is identified in the common bile duct. Axial T2 single shot fast spin echo imaging shows a central linear hypointense band in the distal common bile duct that is not present on the axial T2 fat suppressed imaging nor coronal single-shot and MRCP imaging. As such, this is felt to be more consistent with flow artifact than a true ductal stone. Pancreas: Pancreas is diffusely atrophic without main duct dilatation. Spleen:  No splenomegaly. No suspicious focal mass lesion. Adrenals/Urinary Tract: No adrenal nodule or mass. Kidneys unremarkable. Stomach/Bowel: Stomach is unremarkable. No gastric wall thickening. No evidence of outlet obstruction. Duodenum is normally positioned as is the ligament of Treitz. Duodenal diverticulum noted. No small bowel or colonic dilatation within the visualized abdomen. Vascular/Lymphatic: No abdominal aortic aneurysm. No abdominal lymphadenopathy. Other:  No intraperitoneal free fluid. Musculoskeletal: No worrisome lytic or sclerotic osseous abnormality. IMPRESSION: 1. Gallbladder wall thickening with pericholecystic edema/inflammation. Gallbladder is distended with layering sludge and stones. Adjacent stones are seen in the neck of the gallbladder, potentially just into the proximal cystic duct. Imaging features  compatible with acute cholecystitis. 2. Common bile duct is 8 mm diameter in the porta hepatis region. Common bile duct in the head of the pancreas measures 7 mm diameter. No definite stone is identified in the common bile duct. Axial T2 single shot fast spin echo imaging shows a central linear hypointense band in the distal common bile duct that is not present on the axial T2 fat suppressed imaging nor  coronal single-shot and MRCP imaging. As such, this is felt to be more consistent with flow artifact than a true ductal stone although a tiny distal common bile duct stone is not excluded. 3. Trace right pleural effusion. Electronically Signed   By: Tricia Dodson M.D.   On: 07/18/2023 06:24    Anti-infectives: Anti-infectives (From admission, onward)    Start     Dose/Rate Route Frequency Ordered Stop   07/18/23 0830  ceFAZolin  (ANCEF ) 3 g in sodium chloride  0.9 % 100 mL IVPB  Status:  Discontinued        3 g 200 mL/hr over 30 Minutes Intravenous On call to O.R. 07/18/23 0818 07/18/23 1134   07/18/23 0814  sodium chloride  0.9 % with ceFAZolin  (ANCEF ) ADS Med       Note to Pharmacy: Tricia Dodson E: cabinet override      07/18/23 0814 07/18/23 2029   07/17/23 1430  ceFAZolin  (ANCEF ) 3 g in sodium chloride  0.9 % 100 mL IVPB  Status:  Discontinued        3 g 200 mL/hr over 30 Minutes Intravenous On call to O.R. 07/17/23 1352 07/17/23 1822   07/17/23 1403  sodium chloride  0.9 % with ceFAZolin  (ANCEF ) ADS Med  Status:  Discontinued       Note to Pharmacy: Tricia Dodson HERO: cabinet override      07/17/23 1403 07/17/23 1741   07/17/23 0800  piperacillin -tazobactam (ZOSYN ) IVPB 3.375 g        3.375 g 12.5 mL/hr over 240 Minutes Intravenous Every 8 hours 07/17/23 0759     07/16/23 1945  piperacillin -tazobactam (ZOSYN ) IVPB 3.375 g        3.375 g 100 mL/hr over 30 Minutes Intravenous  Once 07/16/23 1934 07/16/23 2228        Assessment/Plan  Acute gangrenous cholecystitis POD#1 s/p subtotal cholecystectomy 1/9 Dr. Polly   AFVSS,WBC 15.9 from 13, hepatic function panel pending JP drain bilious w/ ~400 mL since surgery  She is hemodynamically stable without peritontiis, clinically has a post-operative bile leak. Will consult GI for possible stent placement    LOS: 1 day   I reviewed nursing notes, hospitalist notes, last 24 h vitals and pain scores, last 48 h intake and output, last 24 h  labs and trends, and last 24 h imaging results.  This care required moderate level of medical decision making.   Tricia Pringle, PA-C [facility] Please see Amion for pager number during day hours 7:00am-4:30pm

## 2023-07-19 NOTE — Progress Notes (Signed)
 Pt. Has home cpap but has decided not to wear it tonight. RT informed pt. To notify if she needed any assistance.

## 2023-07-20 DIAGNOSIS — K810 Acute cholecystitis: Secondary | ICD-10-CM | POA: Diagnosis not present

## 2023-07-20 DIAGNOSIS — K838 Other specified diseases of biliary tract: Secondary | ICD-10-CM | POA: Diagnosis not present

## 2023-07-20 DIAGNOSIS — K9189 Other postprocedural complications and disorders of digestive system: Secondary | ICD-10-CM | POA: Diagnosis not present

## 2023-07-20 LAB — CULTURE, BLOOD (ROUTINE X 2): Special Requests: ADEQUATE

## 2023-07-20 MED ORDER — ORAL CARE MOUTH RINSE: 15.0000 mL | OROMUCOSAL | Status: DC | PRN

## 2023-07-20 MED ORDER — ONDANSETRON HCL 4 MG/2ML IJ SOLN
4.0000 mg | Freq: Four times a day (QID) | INTRAMUSCULAR | Status: DC | PRN
  Administered 2023-07-20: 4 mg via INTRAVENOUS
  Filled 2023-07-20: qty 2

## 2023-07-20 NOTE — Plan of Care (Signed)
 Advanced encouraging activity and sitting in chair

## 2023-07-20 NOTE — Progress Notes (Signed)
 PROGRESS NOTE    Tricia Dodson  FMW:[id number] DOB: 1950-12-01 DOA: 07/16/2023 PCP: Jason Leita Repine, FNP    Brief Narrative:  Tricia Dodson is a 73 y.o. female with medical history significant of obstructive sleep apnea, CPAP use, obesity.  Patient reports being in her usual state of health till approximately 4 days ago when she reports an abrupt onset of right upper quadrant abdominal pain with slight radiation posteriorly laterally.  This was associated with marked diminishment of appetite.  No report of fever or vomiting or diarrhea.  Patient actually has been constipated.  Patient actually presented to [facility] ER yesterday with above complaints.  White blood cell of 17.2 lipase within normal limits.  AST ALT slightly elevated at 59 and 71 respectively.  Lactic acid of 1.4.  Ultrasound abdomen showed sonographic Murphy sign compatible with acute cholecystitis.   Patient was awaiting bed placement in ER since yesterday.  Finally transferred to [facility] earlier this afternoon.  Medical evaluation is sought.   Patient continues to have right upper quadrant area abdominal pain.  Poor appetite.  But thirsty.  No fevers or rigors.  Patient otherwise reports feeling okay   Assessment and Plan:  * Acute cholecystitis -s/p subtotal GB removal by GS -drain in place -GI following in case of need for ERCP -will need a few days of abx   AKI (acute kidney injury) (HCC) -resume clears  Blood culture with strep anginosis -on abx -IV abx -echo shows no endocarditis -repeat blood cultures    Hypertension -BP controlled   OSA - (continuous positive airway pressure) dependence -CPAP     Patient has chronic shingle pain - cw. Gabapentin   Hypothyroid - c.w. synthroid .  Obesity Estimated body mass index is 39.47 kg/m as calculated from the following:   Height as of this encounter: 5' 7 (1.702 m).   Weight as of this encounter: 114.3 kg.   DVT  prophylaxis: SCDs Start: 07/17/23 1913    Code Status: Full Code Family Communication: called friend  1/9  Disposition Plan:  Level of care: Med-Surg Status is: inpt    Consultants:  GS   Subjective: Still with abdominal pain  Objective: Vitals:   07/19/23 1611 07/19/23 2104 07/20/23 0447 07/20/23 0816  BP: 133/70 113/70 124/69 112/72  Pulse: 65 72 78 92  Resp: 16 18 18 18   Temp: 97.7 F (36.5 C) 97.9 F (36.6 C) (!) 97.5 F (36.4 C) 98.1 F (36.7 C)  TempSrc: Oral Oral Oral   SpO2: 97% 92% 97% 95%  Weight:      Height:        Intake/Output Summary (Last 24 hours) at 07/20/2023 1138 Last data filed at 07/20/2023 0800 Gross per 24 hour  Intake 480 ml  Output 460 ml  Net 20 ml   Filed Weights   07/16/23 1318 07/17/23 1418  Weight: 115 kg 114.3 kg    Examination:   General: Appearance:    Obese female in no acute distress     Lungs:      respirations unlabored  Heart:    Normal heart rate.     MS:   All extremities are intact.    Neurologic:   Awake, alert       Data Reviewed: I have personally reviewed following labs and imaging studies  CBC: Recent Labs  Lab 07/16/23 1522 07/17/23 1952 07/18/23 0548 07/19/23 0632  WBC 17.2* 14.6* 13.6* 15.9*  NEUTROABS 13.5*  --   --   --  HGB 12.9 12.5 11.7* 11.8*  HCT 40.3 40.3 36.4 37.1  MCV 85.0 86.5 84.7 85.3  PLT 409* 414* 407* 441*   Basic Metabolic Panel: Recent Labs  Lab 07/16/23 1522 07/17/23 1952 07/18/23 0548 07/19/23 0632  NA 137  --  137 139  K 4.2  --  3.5 3.9  CL 104  --  102 100  CO2 23  --  23 24  GLUCOSE 121*  --  131* 179*  BUN 13  --  20 17  CREATININE 1.16* 1.25* 1.22* 1.12*  CALCIUM  8.9  --  8.4* 8.4*   GFR: Estimated Creatinine Clearance: 59.3 mL/min (A) (by C-G formula based on SCr of 1.12 mg/dL (H)). Liver Function Tests: Recent Labs  Lab 07/16/23 1522 07/18/23 0548 07/19/23 0854  AST 59* 28 43*  ALT 71* 43 52*  ALKPHOS 112 107 109  BILITOT 0.8 1.0 0.6   PROT 7.8 6.2* 6.6  ALBUMIN 3.3* 2.2* 2.3*   Recent Labs  Lab 07/16/23 1522  LIPASE 22   No results for input(s): AMMONIA in the last 168 hours. Coagulation Profile: Recent Labs  Lab 07/18/23 0548  INR 1.2   Cardiac Enzymes: No results for input(s): CKTOTAL, CKMB, CKMBINDEX, TROPONINI in the last 168 hours. BNP (last 3 results) No results for input(s): PROBNP in the last 8760 hours. HbA1C: No results for input(s): HGBA1C in the last 72 hours. CBG: Recent Labs  Lab 07/17/23 1422 07/18/23 0755 07/18/23 1024  GLUCAP 128* 125* 127*   Lipid Profile: No results for input(s): CHOL, HDL, LDLCALC, TRIG, CHOLHDL, LDLDIRECT in the last 72 hours. Thyroid  Function Tests: No results for input(s): TSH, T4TOTAL, FREET4, T3FREE, THYROIDAB in the last 72 hours. Anemia Panel: No results for input(s): VITAMINB12, FOLATE, FERRITIN, TIBC, IRON, RETICCTPCT in the last 72 hours. Sepsis Labs: Recent Labs  Lab 07/16/23 2015  LATICACIDVEN 1.4    Recent Results (from the past 240 hours)  Blood culture (routine x 2)     Status: Abnormal   Collection Time: 07/16/23  8:14 PM   Specimen: Right Antecubital; Blood  Result Value Ref Range Status   Specimen Description   Final    RIGHT ANTECUBITAL BLOOD Performed at [facility], [address], [place], [place] [address]    Special Requests   Final    BOTTLES DRAWN AEROBIC AND ANAEROBIC Blood Culture adequate volume Performed at [facility], [address] [place], [place] [address]    Culture  Setup Time   Final    GRAM POSITIVE COCCI AEROBIC BOTTLE ONLY CRITICAL RESULT CALLED TO, READ BACK BY AND VERIFIED WITH: PHARMD J LEDFORD 07/18/2023 @ 0151 BY AB Performed at [facility], [address] [place], [place] [address]    Culture STREPTOCOCCUS ANGINOSIS (A)  Final   Report Status 07/20/2023 FINAL  Final   Organism ID, Bacteria STREPTOCOCCUS ANGINOSIS  Final       Susceptibility   Streptococcus anginosis - MIC*    PENICILLIN <=0.06 SENSITIVE Sensitive     CEFTRIAXONE <=0.12 SENSITIVE Sensitive     ERYTHROMYCIN <=0.12 SENSITIVE Sensitive     LEVOFLOXACIN  0.5 SENSITIVE Sensitive     VANCOMYCIN 0.5 SENSITIVE Sensitive     * STREPTOCOCCUS ANGINOSIS  Blood culture (routine x 2)     Status: None (Preliminary result)   Collection Time: 07/16/23  8:14 PM   Specimen: Left Antecubital; Blood  Result Value Ref Range Status   Specimen Description   Final  LEFT ANTECUBITAL Performed at [facility], [address] [place], [place] [address]    Special Requests   Final    BOTTLES DRAWN AEROBIC AND ANAEROBIC Blood Culture adequate volume Performed at [facility], [address] [place], [place] [address]    Culture  Setup Time   Final    GRAM POSITIVE COCCI AEROBIC BOTTLE ONLY CRITICAL RESULT CALLED TO, READ BACK BY AND VERIFIED WITH: PHARMD ANDREW MEYER ON 07/18/23 @ 2021 BY DRT    Culture   Final    GRAM POSITIVE COCCI IDENTIFICATION TO FOLLOW Performed at [facility], [address] [place], [place] [address]    Report Status PENDING  Incomplete  Blood Culture ID Panel (Reflexed)     Status: Abnormal   Collection Time: 07/16/23  8:14 PM  Result Value Ref Range Status   Enterococcus faecalis NOT DETECTED NOT DETECTED Final   Enterococcus Faecium NOT DETECTED NOT DETECTED Final   Listeria monocytogenes NOT DETECTED NOT DETECTED Final   Staphylococcus species NOT DETECTED NOT DETECTED Final   Staphylococcus aureus (BCID) NOT DETECTED NOT DETECTED Final   Staphylococcus epidermidis NOT DETECTED NOT DETECTED Final   Staphylococcus lugdunensis NOT DETECTED NOT DETECTED Final   Streptococcus species DETECTED (A) NOT DETECTED Final    Comment: Not Enterococcus species, Streptococcus agalactiae, Streptococcus pyogenes, or Streptococcus pneumoniae. CRITICAL RESULT CALLED TO, READ BACK BY AND VERIFIED WITH: PHARMD J  LEDFORD 07/18/2023 @ 0151 BY AB    Streptococcus agalactiae NOT DETECTED NOT DETECTED Final   Streptococcus pneumoniae NOT DETECTED NOT DETECTED Final   Streptococcus pyogenes NOT DETECTED NOT DETECTED Final   A.calcoaceticus-baumannii NOT DETECTED NOT DETECTED Final   Bacteroides fragilis NOT DETECTED NOT DETECTED Final   Enterobacterales NOT DETECTED NOT DETECTED Final   Enterobacter cloacae complex NOT DETECTED NOT DETECTED Final   Escherichia coli NOT DETECTED NOT DETECTED Final   Klebsiella aerogenes NOT DETECTED NOT DETECTED Final   Klebsiella oxytoca NOT DETECTED NOT DETECTED Final   Klebsiella pneumoniae NOT DETECTED NOT DETECTED Final   Proteus species NOT DETECTED NOT DETECTED Final   Salmonella species NOT DETECTED NOT DETECTED Final   Serratia marcescens NOT DETECTED NOT DETECTED Final   Haemophilus influenzae NOT DETECTED NOT DETECTED Final   Neisseria meningitidis NOT DETECTED NOT DETECTED Final   Pseudomonas aeruginosa NOT DETECTED NOT DETECTED Final   Stenotrophomonas maltophilia NOT DETECTED NOT DETECTED Final   Candida albicans NOT DETECTED NOT DETECTED Final   Candida auris NOT DETECTED NOT DETECTED Final   Candida glabrata NOT DETECTED NOT DETECTED Final   Candida krusei NOT DETECTED NOT DETECTED Final   Candida parapsilosis NOT DETECTED NOT DETECTED Final   Candida tropicalis NOT DETECTED NOT DETECTED Final   Cryptococcus neoformans/gattii NOT DETECTED NOT DETECTED Final    Comment: Performed at [facility], [address] [place], [place] [address]  Surgical pcr screen     Status: Abnormal   Collection Time: 07/18/23  3:44 AM   Specimen: Nasal Mucosa; Nasal Swab  Result Value Ref Range Status   MRSA, PCR NEGATIVE NEGATIVE Final   Staphylococcus aureus POSITIVE (A) NEGATIVE Final    Comment: (NOTE) The Xpert SA Assay (FDA approved for NASAL specimens in patients 8 years of age and older), is one component of a comprehensive surveillance program. It is  not intended to diagnose infection nor to guide or monitor treatment. Performed at [facility], [address], [place],  [place] [address]   Blood Culture ID Panel (Reflexed)     Status: Abnormal   Collection Time: 07/18/23  8:14 PM  Result Value Ref Range Status   Enterococcus faecalis NOT DETECTED NOT DETECTED Final   Enterococcus Faecium NOT DETECTED NOT DETECTED Final   Listeria monocytogenes NOT DETECTED NOT DETECTED Final   Staphylococcus species DETECTED (A) NOT DETECTED Final    Comment: CRITICAL RESULT CALLED TO, READ BACK BY AND VERIFIED WITH: PHARMD ANDREW MEYER ON 07/18/23 @ 2038 BY DRT    Staphylococcus aureus (BCID) NOT DETECTED NOT DETECTED Final   Staphylococcus epidermidis NOT DETECTED NOT DETECTED Final   Staphylococcus lugdunensis NOT DETECTED NOT DETECTED Final   Streptococcus species NOT DETECTED NOT DETECTED Final   Streptococcus agalactiae NOT DETECTED NOT DETECTED Corrected    Comment: CORRECTED ON 01/10 AT 0815: PREVIOUSLY REPORTED AS NOT DETECTED CRITICAL RESULT CALLED TO, READ BACK BY AND VERIFIED WITH: PHARMD ANDREW MEYER ON 07/18/23 @ 2038 BY DRT   Streptococcus pneumoniae NOT DETECTED NOT DETECTED Final   Streptococcus pyogenes NOT DETECTED NOT DETECTED Final   A.calcoaceticus-baumannii NOT DETECTED NOT DETECTED Final   Bacteroides fragilis NOT DETECTED NOT DETECTED Final   Enterobacterales NOT DETECTED NOT DETECTED Final   Enterobacter cloacae complex NOT DETECTED NOT DETECTED Final   Escherichia coli NOT DETECTED NOT DETECTED Final   Klebsiella aerogenes NOT DETECTED NOT DETECTED Final   Klebsiella oxytoca NOT DETECTED NOT DETECTED Final   Klebsiella pneumoniae NOT DETECTED NOT DETECTED Final   Proteus species NOT DETECTED NOT DETECTED Final   Salmonella species NOT DETECTED NOT DETECTED Final   Serratia marcescens NOT DETECTED NOT DETECTED Final   Haemophilus influenzae NOT DETECTED NOT DETECTED Final   Neisseria meningitidis NOT DETECTED NOT  DETECTED Final   Pseudomonas aeruginosa NOT DETECTED NOT DETECTED Final   Stenotrophomonas maltophilia NOT DETECTED NOT DETECTED Final   Candida albicans NOT DETECTED NOT DETECTED Final   Candida auris NOT DETECTED NOT DETECTED Final   Candida glabrata NOT DETECTED NOT DETECTED Final   Candida krusei NOT DETECTED NOT DETECTED Final   Candida parapsilosis NOT DETECTED NOT DETECTED Final   Candida tropicalis NOT DETECTED NOT DETECTED Final   Cryptococcus neoformans/gattii NOT DETECTED NOT DETECTED Final    Comment: Performed at [facility], [address] [place], [place] [address]  Culture, blood (Routine X 2) w Reflex to ID Panel     Status: None (Preliminary result)   Collection Time: 07/20/23  4:56 AM   Specimen: BLOOD  Result Value Ref Range Status   Specimen Description BLOOD BLOOD RIGHT HAND  Final   Special Requests   Final    BOTTLES DRAWN AEROBIC AND ANAEROBIC Blood Culture results may not be optimal due to an inadequate volume of blood received in culture bottles   Culture   Final    NO GROWTH < 12 HOURS Performed at [facility], [address], [place], [place] [address]    Report Status PENDING  Incomplete  Culture, blood (Routine X 2) w Reflex to ID Panel     Status: None (Preliminary result)   Collection Time: 07/20/23  4:57 AM   Specimen: BLOOD  Result Value Ref Range Status   Specimen Description BLOOD BLOOD RIGHT HAND  Final   Special Requests   Final    BOTTLES DRAWN AEROBIC AND ANAEROBIC Blood Culture results may not be optimal due to an inadequate volume of blood received in culture bottles   Culture  Final    NO GROWTH < 12 HOURS Performed at [facility], [address] [place], [place] [address]    Report Status PENDING  Incomplete         Radiology Studies: ECHOCARDIOGRAM COMPLETE Result Date: 07/19/2023    ECHOCARDIOGRAM REPORT   Patient Name:   Reily Treloar Date of Exam: 07/19/2023 Medical Rec #:  [id number]        Height:        67.0 in Accession #:    [id number]       Weight:       252.0 lb Date of Birth:  October 27, 1950        BSA:          2.231 m Patient Age:    72 years         BP:           125/75 mmHg Patient Gender: F                HR:           64 bpm. Exam Location:  Inpatient Procedure: 2D Echo, Color Doppler and Cardiac Doppler Indications:    Bacteremia  History:        Patient has prior history of Echocardiogram examinations, most                 recent 01/02/2021. Abnormal ECG; Risk Factors:Hypertension.  Sonographer:    Amy Chionchio Referring Phys: Akhilesh Sassone, U IMPRESSIONS  1. Left ventricular ejection fraction, by estimation, is 60 to 65%. The left ventricle has normal function. The left ventricle has no regional wall motion abnormalities. There is mild left ventricular hypertrophy. Left ventricular diastolic parameters are consistent with Grade I diastolic dysfunction (impaired relaxation).  2. Right ventricular systolic function is normal. The right ventricular size is normal.  3. The mitral valve is normal in structure. Trivial mitral valve regurgitation. No evidence of mitral stenosis.  4. The aortic valve is tricuspid. There is mild calcification of the aortic valve. Aortic valve regurgitation is not visualized. Aortic valve sclerosis/calcification is present, without any evidence of aortic stenosis.  5. The inferior vena cava is normal in size with greater than 50% respiratory variability, suggesting right atrial pressure of 3 mmHg. FINDINGS  Left Ventricle: Left ventricular ejection fraction, by estimation, is 60 to 65%. The left ventricle has normal function. The left ventricle has no regional wall motion abnormalities. The left ventricular internal cavity size was normal in size. There is  mild left ventricular hypertrophy. Left ventricular diastolic parameters are consistent with Grade I diastolic dysfunction (impaired relaxation). Right Ventricle: The right ventricular size is normal. No increase in right  ventricular wall thickness. Right ventricular systolic function is normal. Left Atrium: Left atrial size was normal in size. Right Atrium: Right atrial size was normal in size. Pericardium: There is no evidence of pericardial effusion. Mitral Valve: The mitral valve is normal in structure. There is mild calcification of the mitral valve leaflet(s). Trivial mitral valve regurgitation. No evidence of mitral valve stenosis. MV peak gradient, 4.2 mmHg. The mean mitral valve gradient is 2.0  mmHg. Tricuspid Valve: The tricuspid valve is normal in structure. Tricuspid valve regurgitation is trivial. No evidence of tricuspid stenosis. Aortic Valve: The aortic valve is tricuspid. There is mild calcification of the aortic valve. Aortic valve regurgitation is not visualized. Aortic valve sclerosis/calcification is present, without any evidence of aortic stenosis. Aortic valve mean gradient measures 4.0 mmHg. Aortic valve peak  gradient measures 7.2 mmHg. Aortic valve area, by VTI measures 2.47 cm. Pulmonic Valve: The pulmonic valve was normal in structure. Pulmonic valve regurgitation is trivial. No evidence of pulmonic stenosis. Aorta: The aortic root is normal in size and structure. Venous: The inferior vena cava is normal in size with greater than 50% respiratory variability, suggesting right atrial pressure of 3 mmHg. IAS/Shunts: No atrial level shunt detected by color flow Doppler.  LEFT VENTRICLE PLAX 2D LVIDd:         4.00 cm     Diastology LVIDs:         2.90 cm     LV e' medial:    7.07 cm/s LV PW:         0.90 cm     LV E/e' medial:  12.5 LV IVS:        1.10 cm     LV e' lateral:   8.05 cm/s LVOT diam:     2.00 cm     LV E/e' lateral: 11.0 LV SV:         73 LV SV Index:   33 LVOT Area:     3.14 cm  LV Volumes (MOD) LV vol d, MOD A2C: 64.0 ml LV vol d, MOD A4C: 68.0 ml LV vol s, MOD A2C: 21.1 ml LV vol s, MOD A4C: 22.6 ml LV SV MOD A2C:     42.9 ml LV SV MOD A4C:     68.0 ml LV SV MOD BP:      43.9 ml RIGHT  VENTRICLE             IVC RV Basal diam:  3.50 cm     IVC diam: 1.60 cm RV S prime:     11.50 cm/s TAPSE (M-mode): 2.3 cm LEFT ATRIUM             Index        RIGHT ATRIUM           Index LA Vol (A2C):   32.9 ml 14.75 ml/m  RA Area:     17.90 cm LA Vol (A4C):   46.2 ml 20.71 ml/m  RA Volume:   51.00 ml  22.86 ml/m LA Biplane Vol: 39.3 ml 17.62 ml/m  AORTIC VALVE                    PULMONIC VALVE AV Area (Vmax):    2.51 cm     PV Vmax:       0.91 m/s AV Area (Vmean):   2.32 cm     PV Peak grad:  3.3 mmHg AV Area (VTI):     2.47 cm AV Vmax:           134.00 cm/s AV Vmean:          95.600 cm/s AV VTI:            0.294 m AV Peak Grad:      7.2 mmHg AV Mean Grad:      4.0 mmHg LVOT Vmax:         107.00 cm/s LVOT Vmean:        70.600 cm/s LVOT VTI:          0.231 m LVOT/AV VTI ratio: 0.79  AORTA Ao Root diam: 2.90 cm Ao Asc diam:  3.30 cm MITRAL VALVE MV Area (PHT): 3.00 cm    SHUNTS MV Area VTI:   2.17 cm    Systemic VTI:  0.23 m MV  Peak grad:  4.2 mmHg    Systemic Diam: 2.00 cm MV Mean grad:  2.0 mmHg MV Vmax:       1.02 m/s MV Vmean:      62.6 cm/s MV Decel Time: 253 msec MV E velocity: 88.60 cm/s MV A velocity: 88.60 cm/s MV E/A ratio:  1.00 Toribio Fuel MD Electronically signed by Toribio Fuel MD Signature Date/Time: 07/19/2023/3:44:05 PM    Final         Scheduled Meds:  acetaminophen   650 mg Oral Q4H   amitriptyline   10 mg Oral QHS   atorvastatin   10 mg Oral Daily   Chlorhexidine  Gluconate Cloth  6 each Topical Daily   DULoxetine   20 mg Oral Daily   enoxaparin  (LOVENOX ) injection  55 mg Subcutaneous QHS   gabapentin   300 mg Oral BID   levothyroxine   25 mcg Oral Q0600   mupirocin  ointment  1 Application Nasal BID   pantoprazole   40 mg Oral Daily   senna-docusate  2 tablet Oral BID   sodium chloride  flush  3 mL Intravenous Q12H   Continuous Infusions:  ampicillin -sulbactam (UNASYN ) IV 3 g (07/20/23 0501)     LOS: 2 days    Time spent: 45 minutes spent on chart review,  discussion with nursing staff, consultants, updating family and interview/physical exam; more than 50% of that time was spent in counseling and/or coordination of care.    Harlene RAYMOND Bowl, DO [facility] Available via Epic secure chat 7am-7pm After these hours, please refer to coverage provider listed on [email or web address] 07/20/2023, 11:38 AM

## 2023-07-20 NOTE — Progress Notes (Addendum)
 Progress Note   LOS: 2 days   Chief Complaint: Bile leak   Subjective   Patient reports her pain is the same and/or somewhat improved as compared to yesterday.  Drainage is less compared to yesterday.   output day one post op 90ml output day 2 post op   Objective   Vital signs in last 24 hours: Temp:  [97.5 F (36.4 C)-98.1 F (36.7 C)] 98.1 F (36.7 C) (01/11 0816) Pulse Rate:  [65-92] 92 (01/11 0816) Resp:  [16-18] 18 (01/11 0816) BP: (112-133)/(69-72) 112/72 (01/11 0816) SpO2:  [92 %-97 %] 95 % (01/11 0816)   Last BM recorded by nurses in past 5 days Stool Type: Type 4 (Like a smooth, soft sausage or snake) (07/20/2023 12:00 AM)  General:   female in no acute distress Heart:  Regular rate and rhythm; no murmurs Pulm: Clear anteriorly; no wheezing Abdomen: Soft, generalized tenderness with more tenderness around drain site.  70 mL bilious drainage noted Extremities:  No edema Neurologic:  Alert and  oriented x4;  No focal deficits.  Psych:  Cooperative. Normal mood and affect.  Intake/Output from previous day: 01/10 0701 - 01/11 0700 In: 480 [P.O.:480] Out: 450 [Drains:450] Intake/Output this shift: Total I/O In: -  Out: 90 [Drains:90]  Studies/Results: ECHOCARDIOGRAM COMPLETE Result Date: 07/19/2023    ECHOCARDIOGRAM REPORT   Patient Name:   Tricia Dodson Date of Exam: 07/19/2023 Medical Rec #:  [id number]        Height:       67.0 in Accession #:    [id number]       Weight:       252.0 lb Date of Birth:  Feb 08, 1951        BSA:          2.231 m Patient Age:    72 years         BP:           125/75 mmHg Patient Gender: F                HR:           64 bpm. Exam Location:  Inpatient Procedure: 2D Echo, Color Doppler and Cardiac Doppler Indications:    Bacteremia  History:        Patient has prior history of Echocardiogram examinations, most                 recent 01/02/2021. Abnormal ECG; Risk Factors:Hypertension.  Sonographer:    Amy Chionchio Referring  Phys: VANN, JESSICA, U IMPRESSIONS  1. Left ventricular ejection fraction, by estimation, is 60 to 65%. The left ventricle has normal function. The left ventricle has no regional wall motion abnormalities. There is mild left ventricular hypertrophy. Left ventricular diastolic parameters are consistent with Grade I diastolic dysfunction (impaired relaxation).  2. Right ventricular systolic function is normal. The right ventricular size is normal.  3. The mitral valve is normal in structure. Trivial mitral valve regurgitation. No evidence of mitral stenosis.  4. The aortic valve is tricuspid. There is mild calcification of the aortic valve. Aortic valve regurgitation is not visualized. Aortic valve sclerosis/calcification is present, without any evidence of aortic stenosis.  5. The inferior vena cava is normal in size with greater than 50% respiratory variability, suggesting right atrial pressure of 3 mmHg. FINDINGS  Left Ventricle: Left ventricular ejection fraction, by estimation, is 60 to 65%. The left ventricle has normal function. The left ventricle has no regional wall  motion abnormalities. The left ventricular internal cavity size was normal in size. There is  mild left ventricular hypertrophy. Left ventricular diastolic parameters are consistent with Grade I diastolic dysfunction (impaired relaxation). Right Ventricle: The right ventricular size is normal. No increase in right ventricular wall thickness. Right ventricular systolic function is normal. Left Atrium: Left atrial size was normal in size. Right Atrium: Right atrial size was normal in size. Pericardium: There is no evidence of pericardial effusion. Mitral Valve: The mitral valve is normal in structure. There is mild calcification of the mitral valve leaflet(s). Trivial mitral valve regurgitation. No evidence of mitral valve stenosis. MV peak gradient, 4.2 mmHg. The mean mitral valve gradient is 2.0  mmHg. Tricuspid Valve: The tricuspid valve is  normal in structure. Tricuspid valve regurgitation is trivial. No evidence of tricuspid stenosis. Aortic Valve: The aortic valve is tricuspid. There is mild calcification of the aortic valve. Aortic valve regurgitation is not visualized. Aortic valve sclerosis/calcification is present, without any evidence of aortic stenosis. Aortic valve mean gradient measures 4.0 mmHg. Aortic valve peak gradient measures 7.2 mmHg. Aortic valve area, by VTI measures 2.47 cm. Pulmonic Valve: The pulmonic valve was normal in structure. Pulmonic valve regurgitation is trivial. No evidence of pulmonic stenosis. Aorta: The aortic root is normal in size and structure. Venous: The inferior vena cava is normal in size with greater than 50% respiratory variability, suggesting right atrial pressure of 3 mmHg. IAS/Shunts: No atrial level shunt detected by color flow Doppler.  LEFT VENTRICLE PLAX 2D LVIDd:         4.00 cm     Diastology LVIDs:         2.90 cm     LV e' medial:    7.07 cm/s LV PW:         0.90 cm     LV E/e' medial:  12.5 LV IVS:        1.10 cm     LV e' lateral:   8.05 cm/s LVOT diam:     2.00 cm     LV E/e' lateral: 11.0 LV SV:         73 LV SV Index:   33 LVOT Area:     3.14 cm  LV Volumes (MOD) LV vol d, MOD A2C: 64.0 ml LV vol d, MOD A4C: 68.0 ml LV vol s, MOD A2C: 21.1 ml LV vol s, MOD A4C: 22.6 ml LV SV MOD A2C:     42.9 ml LV SV MOD A4C:     68.0 ml LV SV MOD BP:      43.9 ml RIGHT VENTRICLE             IVC RV Basal diam:  3.50 cm     IVC diam: 1.60 cm RV S prime:     11.50 cm/s TAPSE (M-mode): 2.3 cm LEFT ATRIUM             Index        RIGHT ATRIUM           Index LA Vol (A2C):   32.9 ml 14.75 ml/m  RA Area:     17.90 cm LA Vol (A4C):   46.2 ml 20.71 ml/m  RA Volume:   51.00 ml  22.86 ml/m LA Biplane Vol: 39.3 ml 17.62 ml/m  AORTIC VALVE                    PULMONIC VALVE AV Area (Vmax):    2.51 cm  PV Vmax:       0.91 m/s AV Area (Vmean):   2.32 cm     PV Peak grad:  3.3 mmHg AV Area (VTI):     2.47 cm AV  Vmax:           134.00 cm/s AV Vmean:          95.600 cm/s AV VTI:            0.294 m AV Peak Grad:      7.2 mmHg AV Mean Grad:      4.0 mmHg LVOT Vmax:         107.00 cm/s LVOT Vmean:        70.600 cm/s LVOT VTI:          0.231 m LVOT/AV VTI ratio: 0.79  AORTA Ao Root diam: 2.90 cm Ao Asc diam:  3.30 cm MITRAL VALVE MV Area (PHT): 3.00 cm    SHUNTS MV Area VTI:   2.17 cm    Systemic VTI:  0.23 m MV Peak grad:  4.2 mmHg    Systemic Diam: 2.00 cm MV Mean grad:  2.0 mmHg MV Vmax:       1.02 m/s MV Vmean:      62.6 cm/s MV Decel Time: 253 msec MV E velocity: 88.60 cm/s MV A velocity: 88.60 cm/s MV E/A ratio:  1.00 Toribio Fuel MD Electronically signed by Toribio Fuel MD Signature Date/Time: 07/19/2023/3:44:05 PM    Final     Lab Results: Recent Labs    07/17/23 1952 07/18/23 0548 07/19/23 0632  WBC 14.6* 13.6* 15.9*  HGB 12.5 11.7* 11.8*  HCT 40.3 36.4 37.1  PLT 414* 407* 441*   BMET Recent Labs    07/17/23 1952 07/18/23 0548 07/19/23 0632  NA  --  137 139  K  --  3.5 3.9  CL  --  102 100  CO2  --  23 24  GLUCOSE  --  131* 179*  BUN  --  20 17  CREATININE 1.25* 1.22* 1.12*  CALCIUM   --  8.4* 8.4*   LFT Recent Labs    07/19/23 0854  PROT 6.6  ALBUMIN 2.3*  AST 43*  ALT 52*  ALKPHOS 109  BILITOT 0.6  BILIDIR 0.2  IBILI 0.4   PT/INR Recent Labs    07/18/23 0548  LABPROT 15.4*  INR 1.2     Scheduled Meds:  acetaminophen   650 mg Oral Q4H   amitriptyline   10 mg Oral QHS   atorvastatin   10 mg Oral Daily   Chlorhexidine  Gluconate Cloth  6 each Topical Daily   DULoxetine   20 mg Oral Daily   enoxaparin  (LOVENOX ) injection  55 mg Subcutaneous QHS   gabapentin   300 mg Oral BID   levothyroxine   25 mcg Oral Q0600   mupirocin  ointment  1 Application Nasal BID   pantoprazole   40 mg Oral Daily   senna-docusate  2 tablet Oral BID   sodium chloride  flush  3 mL Intravenous Q12H   Continuous Infusions:  ampicillin -sulbactam (UNASYN ) IV 3 g (07/20/23 1223)     Impression/Plan:   Acute cholecystitis s/p subtotal cholecystectomy 1/9 Postop bile leak output day one post op 90ml output day 2 post op Worsening leukocytosis with WBC 15.9 1/10. No labs drawn today Stable renal function AST 43, ALT 52, alk phos 109 1/10 -- Continue Zosyn  -- from a clinical standpoint, patient is stable. -- can have clear liquid diet. Any advancement past CLD per surgical  team -- continue to trend LFTs -- continue to monitor drain output. Improving per RN documentation. Less bilious.  -- If worsening drainage, consider ERCP with stent placement  Bayley CHRISTELLA Blower  07/20/2023, 12:52 PM   Attending physician's note   I have taken a history, reviewed the chart and examined the patient. I performed a substantive portion of this encounter, including complete performance of at least one of the key components, in conjunction with the APP. I agree with the APP's note, impression and recommendations.    Decreased drain output, documented only 90 cc so far, yesterday had 450 cc Fluid is much lighter in color per patient Abdominal pain is improving  Continue Zosyn  and supportive care Continue to monitor drain output, will hold off ERCP this weekend given improvement in drain output, will reassess on Monday Continue clear liquid diet and advance as tolerated per surgery  Discussed with Dr. Abran, ERCP backup for the weekend  The patient was provided an opportunity to ask questions and all were answered. The patient agreed with the plan and demonstrated an understanding of the instructions.   LOIS Wilkie Mcgee , MD [phone]

## 2023-07-20 NOTE — Plan of Care (Signed)

## 2023-07-20 NOTE — Progress Notes (Signed)
 [facility] Progress Note  2 Days Post-Op  Subjective: CC:  Reports increase in abdominal pain. Feels drain is more clear. Denies nausea/vomiting. +flatus and a couple BMs.   Objective: Vital signs in last 24 hours: Temp:  [97.5 F (36.4 C)-98.1 F (36.7 C)] 98.1 F (36.7 C) (01/11 0816) Pulse Rate:  [65-92] 92 (01/11 0816) Resp:  [16-18] 18 (01/11 0816) BP: (112-133)/(69-72) 112/72 (01/11 0816) SpO2:  [92 %-97 %] 95 % (01/11 0816)    Intake/Output from previous day: 01/10 0701 - 01/11 0700 In: 480 [P.O.:480] Out: 450 [Drains:450] Intake/Output this shift: No intake/output data recorded.  PE: Gen:  Alert, NAD, pleasant Card:  Regular rate and rhythm Pulm:  non-labored Abd: Soft, TTP lower abdomen and right abdomen without guarding or peritonitis, JP appears more SS today but 450 mL or more. incisions c/d/I, no guarding or rebound Skin: warm and dry, no rashes  Psych: A&Ox3   Lab Results:  Recent Labs    07/18/23 0548 07/19/23 0632  WBC 13.6* 15.9*  HGB 11.7* 11.8*  HCT 36.4 37.1  PLT 407* 441*   BMET Recent Labs    07/18/23 0548 07/19/23 0632  NA 137 139  K 3.5 3.9  CL 102 100  CO2 23 24  GLUCOSE 131* 179*  BUN 20 17  CREATININE 1.22* 1.12*  CALCIUM  8.4* 8.4*   PT/INR Recent Labs    07/18/23 0548  LABPROT 15.4*  INR 1.2   CMP     Component Value Date/Time   NA 139 07/19/2023 0632   K 3.9 07/19/2023 0632   CL 100 07/19/2023 0632   CO2 24 07/19/2023 0632   GLUCOSE 179 (H) 07/19/2023 0632   BUN 17 07/19/2023 0632   CREATININE 1.12 (H) 07/19/2023 0632   CALCIUM  8.4 (L) 07/19/2023 0632   PROT 6.6 07/19/2023 0854   ALBUMIN 2.3 (L) 07/19/2023 0854   AST 43 (H) 07/19/2023 0854   ALT 52 (H) 07/19/2023 0854   ALKPHOS 109 07/19/2023 0854   BILITOT 0.6 07/19/2023 0854   GFRNONAA 52 (L) 07/19/2023 0632   Lipase     Component Value Date/Time   LIPASE 22 07/16/2023 1522       Studies/Results: ECHOCARDIOGRAM COMPLETE Result  Date: 07/19/2023    ECHOCARDIOGRAM REPORT   Patient Name:   Maral Lampe Date of Exam: 07/19/2023 Medical Rec #:  [id number]        Height:       67.0 in Accession #:    [id number]       Weight:       252.0 lb Date of Birth:  08-Apr-1951        BSA:          2.231 m Patient Age:    72 years         BP:           125/75 mmHg Patient Gender: F                HR:           64 bpm. Exam Location:  Inpatient Procedure: 2D Echo, Color Doppler and Cardiac Doppler Indications:    Bacteremia  History:        Patient has prior history of Echocardiogram examinations, most                 recent 01/02/2021. Abnormal ECG; Risk Factors:Hypertension.  Sonographer:    Amy Chionchio Referring Phys: VANN, JESSICA, U IMPRESSIONS  1. Left ventricular  ejection fraction, by estimation, is 60 to 65%. The left ventricle has normal function. The left ventricle has no regional wall motion abnormalities. There is mild left ventricular hypertrophy. Left ventricular diastolic parameters are consistent with Grade I diastolic dysfunction (impaired relaxation).  2. Right ventricular systolic function is normal. The right ventricular size is normal.  3. The mitral valve is normal in structure. Trivial mitral valve regurgitation. No evidence of mitral stenosis.  4. The aortic valve is tricuspid. There is mild calcification of the aortic valve. Aortic valve regurgitation is not visualized. Aortic valve sclerosis/calcification is present, without any evidence of aortic stenosis.  5. The inferior vena cava is normal in size with greater than 50% respiratory variability, suggesting right atrial pressure of 3 mmHg. FINDINGS  Left Ventricle: Left ventricular ejection fraction, by estimation, is 60 to 65%. The left ventricle has normal function. The left ventricle has no regional wall motion abnormalities. The left ventricular internal cavity size was normal in size. There is  mild left ventricular hypertrophy. Left ventricular diastolic parameters are  consistent with Grade I diastolic dysfunction (impaired relaxation). Right Ventricle: The right ventricular size is normal. No increase in right ventricular wall thickness. Right ventricular systolic function is normal. Left Atrium: Left atrial size was normal in size. Right Atrium: Right atrial size was normal in size. Pericardium: There is no evidence of pericardial effusion. Mitral Valve: The mitral valve is normal in structure. There is mild calcification of the mitral valve leaflet(s). Trivial mitral valve regurgitation. No evidence of mitral valve stenosis. MV peak gradient, 4.2 mmHg. The mean mitral valve gradient is 2.0  mmHg. Tricuspid Valve: The tricuspid valve is normal in structure. Tricuspid valve regurgitation is trivial. No evidence of tricuspid stenosis. Aortic Valve: The aortic valve is tricuspid. There is mild calcification of the aortic valve. Aortic valve regurgitation is not visualized. Aortic valve sclerosis/calcification is present, without any evidence of aortic stenosis. Aortic valve mean gradient measures 4.0 mmHg. Aortic valve peak gradient measures 7.2 mmHg. Aortic valve area, by VTI measures 2.47 cm. Pulmonic Valve: The pulmonic valve was normal in structure. Pulmonic valve regurgitation is trivial. No evidence of pulmonic stenosis. Aorta: The aortic root is normal in size and structure. Venous: The inferior vena cava is normal in size with greater than 50% respiratory variability, suggesting right atrial pressure of 3 mmHg. IAS/Shunts: No atrial level shunt detected by color flow Doppler.  LEFT VENTRICLE PLAX 2D LVIDd:         4.00 cm     Diastology LVIDs:         2.90 cm     LV e' medial:    7.07 cm/s LV PW:         0.90 cm     LV E/e' medial:  12.5 LV IVS:        1.10 cm     LV e' lateral:   8.05 cm/s LVOT diam:     2.00 cm     LV E/e' lateral: 11.0 LV SV:         73 LV SV Index:   33 LVOT Area:     3.14 cm  LV Volumes (MOD) LV vol d, MOD A2C: 64.0 ml LV vol d, MOD A4C: 68.0 ml LV  vol s, MOD A2C: 21.1 ml LV vol s, MOD A4C: 22.6 ml LV SV MOD A2C:     42.9 ml LV SV MOD A4C:     68.0 ml LV SV MOD BP:  43.9 ml RIGHT VENTRICLE             IVC RV Basal diam:  3.50 cm     IVC diam: 1.60 cm RV S prime:     11.50 cm/s TAPSE (M-mode): 2.3 cm LEFT ATRIUM             Index        RIGHT ATRIUM           Index LA Vol (A2C):   32.9 ml 14.75 ml/m  RA Area:     17.90 cm LA Vol (A4C):   46.2 ml 20.71 ml/m  RA Volume:   51.00 ml  22.86 ml/m LA Biplane Vol: 39.3 ml 17.62 ml/m  AORTIC VALVE                    PULMONIC VALVE AV Area (Vmax):    2.51 cm     PV Vmax:       0.91 m/s AV Area (Vmean):   2.32 cm     PV Peak grad:  3.3 mmHg AV Area (VTI):     2.47 cm AV Vmax:           134.00 cm/s AV Vmean:          95.600 cm/s AV VTI:            0.294 m AV Peak Grad:      7.2 mmHg AV Mean Grad:      4.0 mmHg LVOT Vmax:         107.00 cm/s LVOT Vmean:        70.600 cm/s LVOT VTI:          0.231 m LVOT/AV VTI ratio: 0.79  AORTA Ao Root diam: 2.90 cm Ao Asc diam:  3.30 cm MITRAL VALVE MV Area (PHT): 3.00 cm    SHUNTS MV Area VTI:   2.17 cm    Systemic VTI:  0.23 m MV Peak grad:  4.2 mmHg    Systemic Diam: 2.00 cm MV Mean grad:  2.0 mmHg MV Vmax:       1.02 m/s MV Vmean:      62.6 cm/s MV Decel Time: 253 msec MV E velocity: 88.60 cm/s MV A velocity: 88.60 cm/s MV E/A ratio:  1.00 Toribio Fuel MD Electronically signed by Toribio Fuel MD Signature Date/Time: 07/19/2023/3:44:05 PM    Final     Anti-infectives: Anti-infectives (From admission, onward)    Start     Dose/Rate Route Frequency Ordered Stop   07/19/23 1800  Ampicillin -Sulbactam (UNASYN ) 3 g in sodium chloride  0.9 % 100 mL IVPB        3 g 200 mL/hr over 30 Minutes Intravenous Every 6 hours 07/19/23 1335     07/18/23 0830  ceFAZolin  (ANCEF ) 3 g in sodium chloride  0.9 % 100 mL IVPB  Status:  Discontinued        3 g 200 mL/hr over 30 Minutes Intravenous On call to O.R. 07/18/23 0818 07/18/23 1134   07/18/23 0814  sodium chloride  0.9 %  with ceFAZolin  (ANCEF ) ADS Med       Note to Pharmacy: Marvin Domino E: cabinet override      07/18/23 0814 07/18/23 2029   07/17/23 1430  ceFAZolin  (ANCEF ) 3 g in sodium chloride  0.9 % 100 mL IVPB  Status:  Discontinued        3 g 200 mL/hr over 30 Minutes Intravenous On call to O.R. 07/17/23 1352 07/17/23 1822  07/17/23 1403  sodium chloride  0.9 % with ceFAZolin  (ANCEF ) ADS Med  Status:  Discontinued       Note to Pharmacy: Jonda Yancy HERO: cabinet override      07/17/23 1403 07/17/23 1741   07/17/23 0800  piperacillin -tazobactam (ZOSYN ) IVPB 3.375 g  Status:  Discontinued        3.375 g 12.5 mL/hr over 240 Minutes Intravenous Every 8 hours 07/17/23 0759 07/19/23 1335   07/16/23 1945  piperacillin -tazobactam (ZOSYN ) IVPB 3.375 g        3.375 g 100 mL/hr over 30 Minutes Intravenous  Once 07/16/23 1934 07/16/23 2228        Assessment/Plan  Acute gangrenous cholecystitis POD#1 s/p subtotal cholecystectomy 1/9 Dr. Polly   AFVSS,WBC 15.9 yesterday from 13, hepatic function panel with noraml LFTs yesterday. Labs not drawn today, plan repeat tomorrow JP clearing up, less bilious but still high output. Patient with more abdominal pain. GI following    LOS: 2 days   I reviewed nursing notes, hospitalist notes, last 24 h vitals and pain scores, last 48 h intake and output, last 24 h labs and trends, and last 24 h imaging results.  This care required moderate level of medical decision making.   Almarie Pringle, PA-C [facility] Please see Amion for pager number during day hours 7:00am-4:30pm

## 2023-07-21 DIAGNOSIS — K838 Other specified diseases of biliary tract: Secondary | ICD-10-CM | POA: Diagnosis not present

## 2023-07-21 DIAGNOSIS — K810 Acute cholecystitis: Secondary | ICD-10-CM | POA: Diagnosis not present

## 2023-07-21 DIAGNOSIS — K9189 Other postprocedural complications and disorders of digestive system: Secondary | ICD-10-CM | POA: Diagnosis not present

## 2023-07-21 LAB — CBC
HCT: 39.8 % (ref 36.0–46.0)
Hemoglobin: 12.3 g/dL (ref 12.0–15.0)
MCH: 26.9 pg (ref 26.0–34.0)
MCHC: 30.9 g/dL (ref 30.0–36.0)
MCV: 87.1 fL (ref 80.0–100.0)
Platelets: 394 10*3/uL (ref 150–400)
RBC: 4.57 MIL/uL (ref 3.87–5.11)
RDW: 13.3 % (ref 11.5–15.5)
WBC: 9.2 10*3/uL (ref 4.0–10.5)
nRBC: 0 % (ref 0.0–0.2)

## 2023-07-21 LAB — HEPATIC FUNCTION PANEL
ALT: 26 U/L (ref 0–44)
AST: 17 U/L (ref 15–41)
Albumin: 2 g/dL — ABNORMAL LOW (ref 3.5–5.0)
Alkaline Phosphatase: 92 U/L (ref 38–126)
Bilirubin, Direct: 0.1 mg/dL (ref 0.0–0.2)
Indirect Bilirubin: 0.3 mg/dL (ref 0.3–0.9)
Total Bilirubin: 0.4 mg/dL (ref 0.0–1.2)
Total Protein: 5.8 g/dL — ABNORMAL LOW (ref 6.5–8.1)

## 2023-07-21 LAB — BASIC METABOLIC PANEL
Anion gap: 7 (ref 5–15)
BUN: 10 mg/dL (ref 8–23)
CO2: 28 mmol/L (ref 22–32)
Calcium: 8.1 mg/dL — ABNORMAL LOW (ref 8.9–10.3)
Chloride: 104 mmol/L (ref 98–111)
Creatinine, Ser: 0.9 mg/dL (ref 0.44–1.00)
GFR, Estimated: >60 mL/min (ref >60)
Glucose, Bld: 166 mg/dL — ABNORMAL HIGH (ref 70–99)
Potassium: 3.4 mmol/L — ABNORMAL LOW (ref 3.5–5.1)
Sodium: 139 mmol/L (ref 135–145)

## 2023-07-21 MED ORDER — POTASSIUM CHLORIDE CRYS ER 20 MEQ PO TBCR
40.0000 meq | EXTENDED_RELEASE_TABLET | Freq: Once | ORAL | Status: AC
  Administered 2023-07-21: 40 meq via ORAL
  Filled 2023-07-21: qty 2

## 2023-07-21 NOTE — Plan of Care (Signed)

## 2023-07-21 NOTE — Progress Notes (Addendum)
 Attempted to call patients nephew at her request, left a voicemail. Someone from our team will try to contact him again tomorrow.   Hosie Spangle, PA-C [facility] Please see Amion for pager number during day hours 7:00am-4:30pm

## 2023-07-21 NOTE — Plan of Care (Signed)
 Patient alert and oriented. Ambulated in room with assistance. Medicated for nausea with relief. JP drain continues to have output this shift. Lap sites clean, dry and intact. Continues on antibiotics. Will continue to monitor.   Problem: Education: Goal: Knowledge of General Education information will improve Description: Including pain rating scale, medication(s)/side effects and non-pharmacologic comfort measures Outcome: Progressing   Problem: Health Behavior/Discharge Planning: Goal: Ability to manage health-related needs will improve Outcome: Progressing   Problem: Clinical Measurements: Goal: Ability to maintain clinical measurements within normal limits will improve Outcome: Progressing Goal: Will remain free from infection Outcome: Progressing Goal: Diagnostic test results will improve Outcome: Progressing Goal: Respiratory complications will improve Outcome: Progressing Goal: Cardiovascular complication will be avoided Outcome: Progressing   Problem: Activity: Goal: Risk for activity intolerance will decrease Outcome: Progressing   Problem: Nutrition: Goal: Adequate nutrition will be maintained Outcome: Progressing   Problem: Coping: Goal: Level of anxiety will decrease Outcome: Progressing   Problem: Elimination: Goal: Will not experience complications related to bowel motility Outcome: Progressing Goal: Will not experience complications related to urinary retention Outcome: Progressing   Problem: Pain Management: Goal: General experience of comfort will improve Outcome: Progressing   Problem: Safety: Goal: Ability to remain free from injury will improve Outcome: Progressing   Problem: Skin Integrity: Goal: Risk for impaired skin integrity will decrease Outcome: Progressing

## 2023-07-21 NOTE — Progress Notes (Signed)
 PROGRESS NOTE    Tricia Dodson  FMW:[id number] DOB: 1950/12/24 DOA: 07/16/2023 PCP: Jason Leita Repine, FNP    Brief Narrative:  Tricia Dodson is a 73 y.o. female with medical history significant of obstructive sleep apnea, CPAP use, obesity.  Patient reports being in her usual state of health till approximately 4 days ago when she reports an abrupt onset of right upper quadrant abdominal pain with slight radiation posteriorly laterally.  This was associated with marked diminishment of appetite.  No report of fever or vomiting or diarrhea.  Patient actually has been constipated.  Patient actually presented to [facility] ER yesterday with above complaints.  White blood cell of 17.2 lipase within normal limits.  AST ALT slightly elevated at 59 and 71 respectively.  Lactic acid of 1.4.  Ultrasound abdomen showed sonographic Murphy sign compatible with acute cholecystitis.   Patient was awaiting bed placement in ER since yesterday.  Finally transferred to [facility] earlier this afternoon.  Medical evaluation is sought.   Patient continues to have right upper quadrant area abdominal pain.  Poor appetite.  But thirsty.  No fevers or rigors.  Patient otherwise reports feeling okay   Assessment and Plan:  * Acute cholecystitis -s/p subtotal GB removal by GS -drain in place -GI following in case of need for ERCP -will need a few days of abx- WBC Normalized   AKI (acute kidney injury) (HCC) -resolved  Blood culture with strep anginosis -on abx -IV abx -echo shows no endocarditis -repeat blood cultures- NGTD    Hypertension -BP controlled   OSA - (continuous positive airway pressure) dependence -CPAP   Hypokalemia -replete   Patient has chronic shingle pain - cw. Gabapentin   Hypothyroid - c.w. synthroid .  Obesity Estimated body mass index is 39.47 kg/m as calculated from the following:   Height as of this encounter: 5' 7 (1.702 m).   Weight as of  this encounter: 114.3 kg.   DVT prophylaxis: SCDs Start: 07/17/23 1913    Code Status: Full Code   Disposition Plan:  Level of care: Med-Surg Status is: inpt    Consultants:  GS   Subjective: Has pain with movement and coughing  Objective: Vitals:   07/20/23 0816 07/20/23 1609 07/20/23 2118 07/21/23 0404  BP: 112/72 121/76 126/74 126/76  Pulse: 92 83 82 87  Resp: 18 16 18 16   Temp: 98.1 F (36.7 C) 98.9 F (37.2 C) 98.7 F (37.1 C) 98.4 F (36.9 C)  TempSrc:  Oral Oral Oral  SpO2: 95% 94% 95% 94%  Weight:      Height:        Intake/Output Summary (Last 24 hours) at 07/21/2023 1040 Last data filed at 07/21/2023 9372 Gross per 24 hour  Intake 720 ml  Output 230 ml  Net 490 ml   Filed Weights   07/16/23 1318 07/17/23 1418  Weight: 115 kg 114.3 kg    Examination:   General: Appearance:    Obese female in no acute distress     Lungs:      respirations unlabored  Heart:    Normal heart rate.     MS:   All extremities are intact.    Neurologic:   Awake, alert       Data Reviewed: I have personally reviewed following labs and imaging studies  CBC: Recent Labs  Lab 07/16/23 1522 07/17/23 1952 07/18/23 0548 07/19/23 0632 07/21/23 0602  WBC 17.2* 14.6* 13.6* 15.9* 9.2  NEUTROABS 13.5*  --   --   --   --  HGB 12.9 12.5 11.7* 11.8* 12.3  HCT 40.3 40.3 36.4 37.1 39.8  MCV 85.0 86.5 84.7 85.3 87.1  PLT 409* 414* 407* 441* 394   Basic Metabolic Panel: Recent Labs  Lab 07/16/23 1522 07/17/23 1952 07/18/23 0548 07/19/23 0632 07/21/23 0602  NA 137  --  137 139 139  K 4.2  --  3.5 3.9 3.4*  CL 104  --  102 100 104  CO2 23  --  23 24 28   GLUCOSE 121*  --  131* 179* 166*  BUN 13  --  20 17 10   CREATININE 1.16* 1.25* 1.22* 1.12* 0.90  CALCIUM  8.9  --  8.4* 8.4* 8.1*   GFR: Estimated Creatinine Clearance: 73.8 mL/min (by C-G formula based on SCr of 0.9 mg/dL). Liver Function Tests: Recent Labs  Lab 07/16/23 1522 07/18/23 0548  07/19/23 0854 07/21/23 0602  AST 59* 28 43* 17  ALT 71* 43 52* 26  ALKPHOS 112 107 109 92  BILITOT 0.8 1.0 0.6 0.4  PROT 7.8 6.2* 6.6 5.8*  ALBUMIN 3.3* 2.2* 2.3* 2.0*   Recent Labs  Lab 07/16/23 1522  LIPASE 22   No results for input(s): AMMONIA in the last 168 hours. Coagulation Profile: Recent Labs  Lab 07/18/23 0548  INR 1.2   Cardiac Enzymes: No results for input(s): CKTOTAL, CKMB, CKMBINDEX, TROPONINI in the last 168 hours. BNP (last 3 results) No results for input(s): PROBNP in the last 8760 hours. HbA1C: No results for input(s): HGBA1C in the last 72 hours. CBG: Recent Labs  Lab 07/17/23 1422 07/18/23 0755 07/18/23 1024  GLUCAP 128* 125* 127*   Lipid Profile: No results for input(s): CHOL, HDL, LDLCALC, TRIG, CHOLHDL, LDLDIRECT in the last 72 hours. Thyroid  Function Tests: No results for input(s): TSH, T4TOTAL, FREET4, T3FREE, THYROIDAB in the last 72 hours. Anemia Panel: No results for input(s): VITAMINB12, FOLATE, FERRITIN, TIBC, IRON, RETICCTPCT in the last 72 hours. Sepsis Labs: Recent Labs  Lab 07/16/23 2015  LATICACIDVEN 1.4    Recent Results (from the past 240 hours)  Blood culture (routine x 2)     Status: Abnormal   Collection Time: 07/16/23  8:14 PM   Specimen: Right Antecubital; Blood  Result Value Ref Range Status   Specimen Description   Final    RIGHT ANTECUBITAL BLOOD Performed at [facility], [address] [place], [place] [address]    Special Requests   Final    BOTTLES DRAWN AEROBIC AND ANAEROBIC Blood Culture adequate volume Performed at [facility], [address] [place], [place] [address]    Culture  Setup Time   Final    GRAM POSITIVE COCCI AEROBIC BOTTLE ONLY CRITICAL RESULT CALLED TO, READ BACK BY AND VERIFIED WITH: PHARMD J LEDFORD 07/18/2023 @ 0151 BY AB Performed at [facility], [address], [place], [place] [address]    Culture  STREPTOCOCCUS ANGINOSIS (A)  Final   Report Status 07/20/2023 FINAL  Final   Organism ID, Bacteria STREPTOCOCCUS ANGINOSIS  Final      Susceptibility   Streptococcus anginosis - MIC*    PENICILLIN <=0.06 SENSITIVE Sensitive     CEFTRIAXONE <=0.12 SENSITIVE Sensitive     ERYTHROMYCIN <=0.12 SENSITIVE Sensitive     LEVOFLOXACIN  0.5 SENSITIVE Sensitive     VANCOMYCIN 0.5 SENSITIVE Sensitive     * STREPTOCOCCUS ANGINOSIS  Blood culture (routine x 2)     Status: None (Preliminary result)   Collection Time: 07/16/23  8:14 PM  Specimen: Left Antecubital; Blood  Result Value Ref Range Status   Specimen Description   Final    LEFT ANTECUBITAL Performed at [facility], [address] [place], [place] [address]    Special Requests   Final    BOTTLES DRAWN AEROBIC AND ANAEROBIC Blood Culture adequate volume Performed at [facility], [address] [place], [place] [address]    Culture  Setup Time   Final    GRAM POSITIVE COCCI AEROBIC BOTTLE ONLY CRITICAL RESULT CALLED TO, READ BACK BY AND VERIFIED WITH: PHARMD ANDREW MEYER ON 07/18/23 @ 2021 BY DRT    Culture   Final    GRAM POSITIVE COCCI IDENTIFICATION TO FOLLOW Performed at [facility], [address] [place], [place] [address]    Report Status PENDING  Incomplete  Blood Culture ID Panel (Reflexed)     Status: Abnormal   Collection Time: 07/16/23  8:14 PM  Result Value Ref Range Status   Enterococcus faecalis NOT DETECTED NOT DETECTED Final   Enterococcus Faecium NOT DETECTED NOT DETECTED Final   Listeria monocytogenes NOT DETECTED NOT DETECTED Final   Staphylococcus species NOT DETECTED NOT DETECTED Final   Staphylococcus aureus (BCID) NOT DETECTED NOT DETECTED Final   Staphylococcus epidermidis NOT DETECTED NOT DETECTED Final   Staphylococcus lugdunensis NOT DETECTED NOT DETECTED Final   Streptococcus species DETECTED (A) NOT DETECTED Final    Comment: Not Enterococcus species, Streptococcus  agalactiae, Streptococcus pyogenes, or Streptococcus pneumoniae. CRITICAL RESULT CALLED TO, READ BACK BY AND VERIFIED WITH: PHARMD J LEDFORD 07/18/2023 @ 0151 BY AB    Streptococcus agalactiae NOT DETECTED NOT DETECTED Final   Streptococcus pneumoniae NOT DETECTED NOT DETECTED Final   Streptococcus pyogenes NOT DETECTED NOT DETECTED Final   A.calcoaceticus-baumannii NOT DETECTED NOT DETECTED Final   Bacteroides fragilis NOT DETECTED NOT DETECTED Final   Enterobacterales NOT DETECTED NOT DETECTED Final   Enterobacter cloacae complex NOT DETECTED NOT DETECTED Final   Escherichia coli NOT DETECTED NOT DETECTED Final   Klebsiella aerogenes NOT DETECTED NOT DETECTED Final   Klebsiella oxytoca NOT DETECTED NOT DETECTED Final   Klebsiella pneumoniae NOT DETECTED NOT DETECTED Final   Proteus species NOT DETECTED NOT DETECTED Final   Salmonella species NOT DETECTED NOT DETECTED Final   Serratia marcescens NOT DETECTED NOT DETECTED Final   Haemophilus influenzae NOT DETECTED NOT DETECTED Final   Neisseria meningitidis NOT DETECTED NOT DETECTED Final   Pseudomonas aeruginosa NOT DETECTED NOT DETECTED Final   Stenotrophomonas maltophilia NOT DETECTED NOT DETECTED Final   Candida albicans NOT DETECTED NOT DETECTED Final   Candida auris NOT DETECTED NOT DETECTED Final   Candida glabrata NOT DETECTED NOT DETECTED Final   Candida krusei NOT DETECTED NOT DETECTED Final   Candida parapsilosis NOT DETECTED NOT DETECTED Final   Candida tropicalis NOT DETECTED NOT DETECTED Final   Cryptococcus neoformans/gattii NOT DETECTED NOT DETECTED Final    Comment: Performed at [facility], [address], [place], [place] [address]  Surgical pcr screen     Status: Abnormal   Collection Time: 07/18/23  3:44 AM   Specimen: Nasal Mucosa; Nasal Swab  Result Value Ref Range Status   MRSA, PCR NEGATIVE NEGATIVE Final   Staphylococcus aureus POSITIVE (A) NEGATIVE Final    Comment: (NOTE) The Xpert SA Assay (FDA  approved for NASAL specimens in patients 19 years of age and older), is one component of a comprehensive surveillance program. It is not intended  to diagnose infection nor to guide or monitor treatment. Performed at [facility], [address] [place], [place] [address]   Blood Culture ID Panel (Reflexed)     Status: Abnormal   Collection Time: 07/18/23  8:14 PM  Result Value Ref Range Status   Enterococcus faecalis NOT DETECTED NOT DETECTED Final   Enterococcus Faecium NOT DETECTED NOT DETECTED Final   Listeria monocytogenes NOT DETECTED NOT DETECTED Final   Staphylococcus species DETECTED (A) NOT DETECTED Final    Comment: CRITICAL RESULT CALLED TO, READ BACK BY AND VERIFIED WITH: PHARMD ANDREW MEYER ON 07/18/23 @ 2038 BY DRT    Staphylococcus aureus (BCID) NOT DETECTED NOT DETECTED Final   Staphylococcus epidermidis NOT DETECTED NOT DETECTED Final   Staphylococcus lugdunensis NOT DETECTED NOT DETECTED Final   Streptococcus species NOT DETECTED NOT DETECTED Final   Streptococcus agalactiae NOT DETECTED NOT DETECTED Corrected    Comment: CORRECTED ON 01/10 AT 0815: PREVIOUSLY REPORTED AS NOT DETECTED CRITICAL RESULT CALLED TO, READ BACK BY AND VERIFIED WITH: PHARMD ANDREW MEYER ON 07/18/23 @ 2038 BY DRT   Streptococcus pneumoniae NOT DETECTED NOT DETECTED Final   Streptococcus pyogenes NOT DETECTED NOT DETECTED Final   A.calcoaceticus-baumannii NOT DETECTED NOT DETECTED Final   Bacteroides fragilis NOT DETECTED NOT DETECTED Final   Enterobacterales NOT DETECTED NOT DETECTED Final   Enterobacter cloacae complex NOT DETECTED NOT DETECTED Final   Escherichia coli NOT DETECTED NOT DETECTED Final   Klebsiella aerogenes NOT DETECTED NOT DETECTED Final   Klebsiella oxytoca NOT DETECTED NOT DETECTED Final   Klebsiella pneumoniae NOT DETECTED NOT DETECTED Final   Proteus species NOT DETECTED NOT DETECTED Final   Salmonella species NOT DETECTED NOT DETECTED Final   Serratia marcescens  NOT DETECTED NOT DETECTED Final   Haemophilus influenzae NOT DETECTED NOT DETECTED Final   Neisseria meningitidis NOT DETECTED NOT DETECTED Final   Pseudomonas aeruginosa NOT DETECTED NOT DETECTED Final   Stenotrophomonas maltophilia NOT DETECTED NOT DETECTED Final   Candida albicans NOT DETECTED NOT DETECTED Final   Candida auris NOT DETECTED NOT DETECTED Final   Candida glabrata NOT DETECTED NOT DETECTED Final   Candida krusei NOT DETECTED NOT DETECTED Final   Candida parapsilosis NOT DETECTED NOT DETECTED Final   Candida tropicalis NOT DETECTED NOT DETECTED Final   Cryptococcus neoformans/gattii NOT DETECTED NOT DETECTED Final    Comment: Performed at [facility], [address] [place], [place] [address]  Culture, blood (Routine X 2) w Reflex to ID Panel     Status: None (Preliminary result)   Collection Time: 07/20/23  4:56 AM   Specimen: BLOOD  Result Value Ref Range Status   Specimen Description BLOOD BLOOD RIGHT HAND  Final   Special Requests   Final    BOTTLES DRAWN AEROBIC AND ANAEROBIC Blood Culture results may not be optimal due to an inadequate volume of blood received in culture bottles   Culture   Final    NO GROWTH 1 DAY Performed at [facility], [address] [place], [place] [address]    Report Status PENDING  Incomplete  Culture, blood (Routine X 2) w Reflex to ID Panel     Status: None (Preliminary result)   Collection Time: 07/20/23  4:57 AM   Specimen: BLOOD  Result Value Ref Range Status   Specimen Description BLOOD BLOOD RIGHT HAND  Final   Special Requests   Final    BOTTLES DRAWN AEROBIC AND ANAEROBIC Blood Culture results  may not be optimal due to an inadequate volume of blood received in culture bottles   Culture   Final    NO GROWTH 1 DAY Performed at [facility], [address], [place], [place] [address]    Report Status PENDING  Incomplete         Radiology Studies: ECHOCARDIOGRAM COMPLETE Result Date: 07/19/2023     ECHOCARDIOGRAM REPORT   Patient Name:   Tricia Dodson Date of Exam: 07/19/2023 Medical Rec #:  [id number]        Height:       67.0 in Accession #:    [id number]       Weight:       252.0 lb Date of Birth:  05/19/1951        BSA:          2.231 m Patient Age:    72 years         BP:           125/75 mmHg Patient Gender: F                HR:           64 bpm. Exam Location:  Inpatient Procedure: 2D Echo, Color Doppler and Cardiac Doppler Indications:    Bacteremia  History:        Patient has prior history of Echocardiogram examinations, most                 recent 01/02/2021. Abnormal ECG; Risk Factors:Hypertension.  Sonographer:    Amy Chionchio Referring Phys: Mehkai Gallo, U IMPRESSIONS  1. Left ventricular ejection fraction, by estimation, is 60 to 65%. The left ventricle has normal function. The left ventricle has no regional wall motion abnormalities. There is mild left ventricular hypertrophy. Left ventricular diastolic parameters are consistent with Grade I diastolic dysfunction (impaired relaxation).  2. Right ventricular systolic function is normal. The right ventricular size is normal.  3. The mitral valve is normal in structure. Trivial mitral valve regurgitation. No evidence of mitral stenosis.  4. The aortic valve is tricuspid. There is mild calcification of the aortic valve. Aortic valve regurgitation is not visualized. Aortic valve sclerosis/calcification is present, without any evidence of aortic stenosis.  5. The inferior vena cava is normal in size with greater than 50% respiratory variability, suggesting right atrial pressure of 3 mmHg. FINDINGS  Left Ventricle: Left ventricular ejection fraction, by estimation, is 60 to 65%. The left ventricle has normal function. The left ventricle has no regional wall motion abnormalities. The left ventricular internal cavity size was normal in size. There is  mild left ventricular hypertrophy. Left ventricular diastolic parameters are consistent with Grade I  diastolic dysfunction (impaired relaxation). Right Ventricle: The right ventricular size is normal. No increase in right ventricular wall thickness. Right ventricular systolic function is normal. Left Atrium: Left atrial size was normal in size. Right Atrium: Right atrial size was normal in size. Pericardium: There is no evidence of pericardial effusion. Mitral Valve: The mitral valve is normal in structure. There is mild calcification of the mitral valve leaflet(s). Trivial mitral valve regurgitation. No evidence of mitral valve stenosis. MV peak gradient, 4.2 mmHg. The mean mitral valve gradient is 2.0  mmHg. Tricuspid Valve: The tricuspid valve is normal in structure. Tricuspid valve regurgitation is trivial. No evidence of tricuspid stenosis. Aortic Valve: The aortic valve is tricuspid. There is mild calcification of the aortic valve. Aortic valve regurgitation is not visualized. Aortic valve  sclerosis/calcification is present, without any evidence of aortic stenosis. Aortic valve mean gradient measures 4.0 mmHg. Aortic valve peak gradient measures 7.2 mmHg. Aortic valve area, by VTI measures 2.47 cm. Pulmonic Valve: The pulmonic valve was normal in structure. Pulmonic valve regurgitation is trivial. No evidence of pulmonic stenosis. Aorta: The aortic root is normal in size and structure. Venous: The inferior vena cava is normal in size with greater than 50% respiratory variability, suggesting right atrial pressure of 3 mmHg. IAS/Shunts: No atrial level shunt detected by color flow Doppler.  LEFT VENTRICLE PLAX 2D LVIDd:         4.00 cm     Diastology LVIDs:         2.90 cm     LV e' medial:    7.07 cm/s LV PW:         0.90 cm     LV E/e' medial:  12.5 LV IVS:        1.10 cm     LV e' lateral:   8.05 cm/s LVOT diam:     2.00 cm     LV E/e' lateral: 11.0 LV SV:         73 LV SV Index:   33 LVOT Area:     3.14 cm  LV Volumes (MOD) LV vol d, MOD A2C: 64.0 ml LV vol d, MOD A4C: 68.0 ml LV vol s, MOD A2C: 21.1 ml  LV vol s, MOD A4C: 22.6 ml LV SV MOD A2C:     42.9 ml LV SV MOD A4C:     68.0 ml LV SV MOD BP:      43.9 ml RIGHT VENTRICLE             IVC RV Basal diam:  3.50 cm     IVC diam: 1.60 cm RV S prime:     11.50 cm/s TAPSE (M-mode): 2.3 cm LEFT ATRIUM             Index        RIGHT ATRIUM           Index LA Vol (A2C):   32.9 ml 14.75 ml/m  RA Area:     17.90 cm LA Vol (A4C):   46.2 ml 20.71 ml/m  RA Volume:   51.00 ml  22.86 ml/m LA Biplane Vol: 39.3 ml 17.62 ml/m  AORTIC VALVE                    PULMONIC VALVE AV Area (Vmax):    2.51 cm     PV Vmax:       0.91 m/s AV Area (Vmean):   2.32 cm     PV Peak grad:  3.3 mmHg AV Area (VTI):     2.47 cm AV Vmax:           134.00 cm/s AV Vmean:          95.600 cm/s AV VTI:            0.294 m AV Peak Grad:      7.2 mmHg AV Mean Grad:      4.0 mmHg LVOT Vmax:         107.00 cm/s LVOT Vmean:        70.600 cm/s LVOT VTI:          0.231 m LVOT/AV VTI ratio: 0.79  AORTA Ao Root diam: 2.90 cm Ao Asc diam:  3.30 cm MITRAL VALVE MV Area (PHT): 3.00 cm  SHUNTS MV Area VTI:   2.17 cm    Systemic VTI:  0.23 m MV Peak grad:  4.2 mmHg    Systemic Diam: 2.00 cm MV Mean grad:  2.0 mmHg MV Vmax:       1.02 m/s MV Vmean:      62.6 cm/s MV Decel Time: 253 msec MV E velocity: 88.60 cm/s MV A velocity: 88.60 cm/s MV E/A ratio:  1.00 Toribio Fuel MD Electronically signed by Toribio Fuel MD Signature Date/Time: 07/19/2023/3:44:05 PM    Final         Scheduled Meds:  amitriptyline   10 mg Oral QHS   atorvastatin   10 mg Oral Daily   Chlorhexidine  Gluconate Cloth  6 each Topical Daily   DULoxetine   20 mg Oral Daily   enoxaparin  (LOVENOX ) injection  55 mg Subcutaneous QHS   gabapentin   300 mg Oral BID   levothyroxine   25 mcg Oral Q0600   mupirocin  ointment  1 Application Nasal BID   pantoprazole   40 mg Oral Daily   potassium chloride   40 mEq Oral Once   senna-docusate  2 tablet Oral BID   sodium chloride  flush  3 mL Intravenous Q12H   Continuous Infusions:   ampicillin -sulbactam (UNASYN ) IV 3 g (07/21/23 0627)     LOS: 3 days    Time spent: 45 minutes spent on chart review, discussion with nursing staff, consultants, updating family and interview/physical exam; more than 50% of that time was spent in counseling and/or coordination of care.    Harlene RAYMOND Bowl, DO [facility] Available via Epic secure chat 7am-7pm After these hours, please refer to coverage provider listed on [email or web address] 07/21/2023, 10:40 AM

## 2023-07-21 NOTE — Progress Notes (Addendum)
 Progress Note   LOS: 3 days   Chief Complaint: Bile leak   Subjective   Patient states she is feeling better today.  Having improvement in her abdominal pain.  Abdominal pain only occurs with movement and it is mainly around her drain.  50 mL output via drain, becoming more clear   Objective   Vital signs in last 24 hours: Temp:  [98.4 F (36.9 C)-98.9 F (37.2 C)] 98.4 F (36.9 C) (01/12 0404) Pulse Rate:  [82-87] 87 (01/12 0404) Resp:  [16-18] 16 (01/12 0404) BP: (121-126)/(74-76) 126/76 (01/12 0404) SpO2:  [94 %-95 %] 94 % (01/12 0404)   Last BM recorded by nurses in past 5 days Stool Type: Type 4 (Like a smooth, soft sausage or snake) (07/20/2023 12:00 AM)  General:   female in no acute distress Heart:  Regular rate and rhythm; no murmurs Pulm: Clear anteriorly; no wheezing Abdomen: soft, nondistended, tenderness in right side around drain site.  Drain with 60 mL lighter colored fluid  extremities:  No edema Neurologic:  Alert and  oriented x4;  No focal deficits.  Psych:  Cooperative. Normal mood and affect.  Intake/Output from previous day: 01/11 0701 - 01/12 0700 In: 720 [P.O.:520; IV Piggyback:200] Out: 320 [Drains:320] Intake/Output this shift: No intake/output data recorded.  Studies/Results: ECHOCARDIOGRAM COMPLETE Result Date: 07/19/2023    ECHOCARDIOGRAM REPORT   Patient Name:   Tricia Dodson Date of Exam: 07/19/2023 Medical Rec #:  [id number]        Height:       67.0 in Accession #:    [id number]       Weight:       252.0 lb Date of Birth:  1951-01-24        BSA:          2.231 m Patient Age:    73 years         BP:           125/75 mmHg Patient Gender: F                HR:           64 bpm. Exam Location:  Inpatient Procedure: 2D Echo, Color Doppler and Cardiac Doppler Indications:    Bacteremia  History:        Patient has prior history of Echocardiogram examinations, most                 recent 01/02/2021. Abnormal ECG; Risk Factors:Hypertension.   Sonographer:    Amy Chionchio Referring Phys: VANN, JESSICA, U IMPRESSIONS  1. Left ventricular ejection fraction, by estimation, is 60 to 65%. The left ventricle has normal function. The left ventricle has no regional wall motion abnormalities. There is mild left ventricular hypertrophy. Left ventricular diastolic parameters are consistent with Grade I diastolic dysfunction (impaired relaxation).  2. Right ventricular systolic function is normal. The right ventricular size is normal.  3. The mitral valve is normal in structure. Trivial mitral valve regurgitation. No evidence of mitral stenosis.  4. The aortic valve is tricuspid. There is mild calcification of the aortic valve. Aortic valve regurgitation is not visualized. Aortic valve sclerosis/calcification is present, without any evidence of aortic stenosis.  5. The inferior vena cava is normal in size with greater than 50% respiratory variability, suggesting right atrial pressure of 3 mmHg. FINDINGS  Left Ventricle: Left ventricular ejection fraction, by estimation, is 60 to 65%. The left ventricle has normal function. The left ventricle has  no regional wall motion abnormalities. The left ventricular internal cavity size was normal in size. There is  mild left ventricular hypertrophy. Left ventricular diastolic parameters are consistent with Grade I diastolic dysfunction (impaired relaxation). Right Ventricle: The right ventricular size is normal. No increase in right ventricular wall thickness. Right ventricular systolic function is normal. Left Atrium: Left atrial size was normal in size. Right Atrium: Right atrial size was normal in size. Pericardium: There is no evidence of pericardial effusion. Mitral Valve: The mitral valve is normal in structure. There is mild calcification of the mitral valve leaflet(s). Trivial mitral valve regurgitation. No evidence of mitral valve stenosis. MV peak gradient, 4.2 mmHg. The mean mitral valve gradient is 2.0  mmHg.  Tricuspid Valve: The tricuspid valve is normal in structure. Tricuspid valve regurgitation is trivial. No evidence of tricuspid stenosis. Aortic Valve: The aortic valve is tricuspid. There is mild calcification of the aortic valve. Aortic valve regurgitation is not visualized. Aortic valve sclerosis/calcification is present, without any evidence of aortic stenosis. Aortic valve mean gradient measures 4.0 mmHg. Aortic valve peak gradient measures 7.2 mmHg. Aortic valve area, by VTI measures 2.47 cm. Pulmonic Valve: The pulmonic valve was normal in structure. Pulmonic valve regurgitation is trivial. No evidence of pulmonic stenosis. Aorta: The aortic root is normal in size and structure. Venous: The inferior vena cava is normal in size with greater than 50% respiratory variability, suggesting right atrial pressure of 3 mmHg. IAS/Shunts: No atrial level shunt detected by color flow Doppler.  LEFT VENTRICLE PLAX 2D LVIDd:         4.00 cm     Diastology LVIDs:         2.90 cm     LV e' medial:    7.07 cm/s LV PW:         0.90 cm     LV E/e' medial:  12.5 LV IVS:        1.10 cm     LV e' lateral:   8.05 cm/s LVOT diam:     2.00 cm     LV E/e' lateral: 11.0 LV SV:         73 LV SV Index:   33 LVOT Area:     3.14 cm  LV Volumes (MOD) LV vol d, MOD A2C: 64.0 ml LV vol d, MOD A4C: 68.0 ml LV vol s, MOD A2C: 21.1 ml LV vol s, MOD A4C: 22.6 ml LV SV MOD A2C:     42.9 ml LV SV MOD A4C:     68.0 ml LV SV MOD BP:      43.9 ml RIGHT VENTRICLE             IVC RV Basal diam:  3.50 cm     IVC diam: 1.60 cm RV S prime:     11.50 cm/s TAPSE (M-mode): 2.3 cm LEFT ATRIUM             Index        RIGHT ATRIUM           Index LA Vol (A2C):   32.9 ml 14.75 ml/m  RA Area:     17.90 cm LA Vol (A4C):   46.2 ml 20.71 ml/m  RA Volume:   51.00 ml  22.86 ml/m LA Biplane Vol: 39.3 ml 17.62 ml/m  AORTIC VALVE                    PULMONIC VALVE AV Area (Vmax):  2.51 cm     PV Vmax:       0.91 m/s AV Area (Vmean):   2.32 cm     PV Peak grad:   3.3 mmHg AV Area (VTI):     2.47 cm AV Vmax:           134.00 cm/s AV Vmean:          95.600 cm/s AV VTI:            0.294 m AV Peak Grad:      7.2 mmHg AV Mean Grad:      4.0 mmHg LVOT Vmax:         107.00 cm/s LVOT Vmean:        70.600 cm/s LVOT VTI:          0.231 m LVOT/AV VTI ratio: 0.79  AORTA Ao Root diam: 2.90 cm Ao Asc diam:  3.30 cm MITRAL VALVE MV Area (PHT): 3.00 cm    SHUNTS MV Area VTI:   2.17 cm    Systemic VTI:  0.23 m MV Peak grad:  4.2 mmHg    Systemic Diam: 2.00 cm MV Mean grad:  2.0 mmHg MV Vmax:       1.02 m/s MV Vmean:      62.6 cm/s MV Decel Time: 253 msec MV E velocity: 88.60 cm/s MV A velocity: 88.60 cm/s MV E/A ratio:  1.00 Toribio Fuel MD Electronically signed by Toribio Fuel MD Signature Date/Time: 07/19/2023/3:44:05 PM    Final     Lab Results: Recent Labs    07/19/23 0632 07/21/23 0602  WBC 15.9* 9.2  HGB 11.8* 12.3  HCT 37.1 39.8  PLT 441* 394   BMET Recent Labs    07/19/23 0632 07/21/23 0602  NA 139 139  K 3.9 3.4*  CL 100 104  CO2 24 28  GLUCOSE 179* 166*  BUN 17 10  CREATININE 1.12* 0.90  CALCIUM  8.4* 8.1*   LFT Recent Labs    07/21/23 0602  PROT 5.8*  ALBUMIN 2.0*  AST 17  ALT 26  ALKPHOS 92  BILITOT 0.4  BILIDIR 0.1  IBILI 0.3   PT/INR No results for input(s): LABPROT, INR in the last 72 hours.   Scheduled Meds:  amitriptyline   10 mg Oral QHS   atorvastatin   10 mg Oral Daily   Chlorhexidine  Gluconate Cloth  6 each Topical Daily   DULoxetine   20 mg Oral Daily   enoxaparin  (LOVENOX ) injection  55 mg Subcutaneous QHS   gabapentin   300 mg Oral BID   levothyroxine   25 mcg Oral Q0600   mupirocin  ointment  1 Application Nasal BID   pantoprazole   40 mg Oral Daily   senna-docusate  2 tablet Oral BID   sodium chloride  flush  3 mL Intravenous Q12H   Continuous Infusions:  ampicillin -sulbactam (UNASYN ) IV 3 g (07/21/23 1111)     Impression/Pan:   Acute cholecystitis s/p subtotal cholecystectomy 1/9 Postop bile  leak output day one post op 90ml output day 2 post op 50ml output day 3 post op (1/12) WBC 9.2, improved Stable renal function AST 17, ALT 26, alk phos 92, improved T. bili 0.4, improved - Labs improving, improving from a clinical standpoint, and improvement in drain output that is less bilious compared to postop day 1 and 2 - Continue to trend LFTs - Continue ABX - Continue to monitor drain output - Defer consideration of ERCP to Dr. Abran and Dr. Shila as drain output is improving  along with LFTs and leukocytosis   Bayley CHRISTELLA Blower  07/21/2023, 12:11 PM   Attending physician's note   I have taken a history, reviewed the chart and examined the patient. I performed a substantive portion of this encounter, including complete performance of at least one of the key components, in conjunction with the APP. I agree with the APP's note, impression and recommendations.   Decreased JP drain output, much lighter in color appears nonbilious, serosanguineous fluid. Abdominal pain is decreasing LFT trended down to normal range  Will continue to monitor drain output, will defer ERCP if she continues to improve GI will follow along  The patient was provided an opportunity to ask questions and all were answered. The patient agreed with the plan and demonstrated an understanding of the instructions.   LOIS Wilkie Mcgee , MD [phone]

## 2023-07-21 NOTE — Progress Notes (Signed)
 [facility] Progress Note  3 Days Post-Op  Subjective: CC:  Abdominal pain stable to improved, located around drain. Tolerating FLD but reports poor PO intake and occasionally feeling queasy. Denies nausea or vomiting. Having bowel function  Objective: Vital signs in last 24 hours: Temp:  [98.4 F (36.9 C)-98.9 F (37.2 C)] 98.4 F (36.9 C) (01/12 0404) Pulse Rate:  [82-87] 87 (01/12 0404) Resp:  [16-18] 16 (01/12 0404) BP: (121-126)/(74-76) 126/76 (01/12 0404) SpO2:  [94 %-95 %] 94 % (01/12 0404)    Intake/Output from previous day: 01/11 0701 - 01/12 0700 In: 720 [P.O.:520; IV Piggyback:200] Out: 320 [Drains:320] Intake/Output this shift: No intake/output data recorded.  PE: Gen:  Alert, NAD, pleasant Card:  Regular rate and rhythm Pulm:  non-labored Abd: Soft, TTP lower abdomen and right abdomen without guarding or peritonitis, JP appears SS today 320 mL (from 450 yesterday) incisions c/d/I, no guarding or rebound Skin: warm and dry, no rashes  Psych: A&Ox3   Lab Results:  Recent Labs    07/19/23 0632 07/21/23 0602  WBC 15.9* 9.2  HGB 11.8* 12.3  HCT 37.1 39.8  PLT 441* 394   BMET Recent Labs    07/19/23 0632 07/21/23 0602  NA 139 139  K 3.9 3.4*  CL 100 104  CO2 24 28  GLUCOSE 179* 166*  BUN 17 10  CREATININE 1.12* 0.90  CALCIUM  8.4* 8.1*   PT/INR No results for input(s): LABPROT, INR in the last 72 hours.  CMP     Component Value Date/Time   NA 139 07/21/2023 0602   K 3.4 (L) 07/21/2023 0602   CL 104 07/21/2023 0602   CO2 28 07/21/2023 0602   GLUCOSE 166 (H) 07/21/2023 0602   BUN 10 07/21/2023 0602   CREATININE 0.90 07/21/2023 0602   CALCIUM  8.1 (L) 07/21/2023 0602   PROT 6.6 07/19/2023 0854   ALBUMIN 2.3 (L) 07/19/2023 0854   AST 43 (H) 07/19/2023 0854   ALT 52 (H) 07/19/2023 0854   ALKPHOS 109 07/19/2023 0854   BILITOT 0.6 07/19/2023 0854   GFRNONAA >60 07/21/2023 0602   Lipase     Component Value Date/Time    LIPASE 22 07/16/2023 1522       Studies/Results: ECHOCARDIOGRAM COMPLETE Result Date: 07/19/2023    ECHOCARDIOGRAM REPORT   Patient Name:   Tricia Dodson Date of Exam: 07/19/2023 Medical Rec #:  [id number]        Height:       67.0 in Accession #:    [id number]       Weight:       252.0 lb Date of Birth:  1950/08/04        BSA:          2.231 m Patient Age:    72 years         BP:           125/75 mmHg Patient Gender: F                HR:           64 bpm. Exam Location:  Inpatient Procedure: 2D Echo, Color Doppler and Cardiac Doppler Indications:    Bacteremia  History:        Patient has prior history of Echocardiogram examinations, most                 recent 01/02/2021. Abnormal ECG; Risk Factors:Hypertension.  Sonographer:    Amy Chionchio Referring Phys: VANN, JESSICA, U  IMPRESSIONS  1. Left ventricular ejection fraction, by estimation, is 60 to 65%. The left ventricle has normal function. The left ventricle has no regional wall motion abnormalities. There is mild left ventricular hypertrophy. Left ventricular diastolic parameters are consistent with Grade I diastolic dysfunction (impaired relaxation).  2. Right ventricular systolic function is normal. The right ventricular size is normal.  3. The mitral valve is normal in structure. Trivial mitral valve regurgitation. No evidence of mitral stenosis.  4. The aortic valve is tricuspid. There is mild calcification of the aortic valve. Aortic valve regurgitation is not visualized. Aortic valve sclerosis/calcification is present, without any evidence of aortic stenosis.  5. The inferior vena cava is normal in size with greater than 50% respiratory variability, suggesting right atrial pressure of 3 mmHg. FINDINGS  Left Ventricle: Left ventricular ejection fraction, by estimation, is 60 to 65%. The left ventricle has normal function. The left ventricle has no regional wall motion abnormalities. The left ventricular internal cavity size was normal in size.  There is  mild left ventricular hypertrophy. Left ventricular diastolic parameters are consistent with Grade I diastolic dysfunction (impaired relaxation). Right Ventricle: The right ventricular size is normal. No increase in right ventricular wall thickness. Right ventricular systolic function is normal. Left Atrium: Left atrial size was normal in size. Right Atrium: Right atrial size was normal in size. Pericardium: There is no evidence of pericardial effusion. Mitral Valve: The mitral valve is normal in structure. There is mild calcification of the mitral valve leaflet(s). Trivial mitral valve regurgitation. No evidence of mitral valve stenosis. MV peak gradient, 4.2 mmHg. The mean mitral valve gradient is 2.0  mmHg. Tricuspid Valve: The tricuspid valve is normal in structure. Tricuspid valve regurgitation is trivial. No evidence of tricuspid stenosis. Aortic Valve: The aortic valve is tricuspid. There is mild calcification of the aortic valve. Aortic valve regurgitation is not visualized. Aortic valve sclerosis/calcification is present, without any evidence of aortic stenosis. Aortic valve mean gradient measures 4.0 mmHg. Aortic valve peak gradient measures 7.2 mmHg. Aortic valve area, by VTI measures 2.47 cm. Pulmonic Valve: The pulmonic valve was normal in structure. Pulmonic valve regurgitation is trivial. No evidence of pulmonic stenosis. Aorta: The aortic root is normal in size and structure. Venous: The inferior vena cava is normal in size with greater than 50% respiratory variability, suggesting right atrial pressure of 3 mmHg. IAS/Shunts: No atrial level shunt detected by color flow Doppler.  LEFT VENTRICLE PLAX 2D LVIDd:         4.00 cm     Diastology LVIDs:         2.90 cm     LV e' medial:    7.07 cm/s LV PW:         0.90 cm     LV E/e' medial:  12.5 LV IVS:        1.10 cm     LV e' lateral:   8.05 cm/s LVOT diam:     2.00 cm     LV E/e' lateral: 11.0 LV SV:         73 LV SV Index:   33 LVOT Area:      3.14 cm  LV Volumes (MOD) LV vol d, MOD A2C: 64.0 ml LV vol d, MOD A4C: 68.0 ml LV vol s, MOD A2C: 21.1 ml LV vol s, MOD A4C: 22.6 ml LV SV MOD A2C:     42.9 ml LV SV MOD A4C:     68.0 ml LV SV  MOD BP:      43.9 ml RIGHT VENTRICLE             IVC RV Basal diam:  3.50 cm     IVC diam: 1.60 cm RV S prime:     11.50 cm/s TAPSE (M-mode): 2.3 cm LEFT ATRIUM             Index        RIGHT ATRIUM           Index LA Vol (A2C):   32.9 ml 14.75 ml/m  RA Area:     17.90 cm LA Vol (A4C):   46.2 ml 20.71 ml/m  RA Volume:   51.00 ml  22.86 ml/m LA Biplane Vol: 39.3 ml 17.62 ml/m  AORTIC VALVE                    PULMONIC VALVE AV Area (Vmax):    2.51 cm     PV Vmax:       0.91 m/s AV Area (Vmean):   2.32 cm     PV Peak grad:  3.3 mmHg AV Area (VTI):     2.47 cm AV Vmax:           134.00 cm/s AV Vmean:          95.600 cm/s AV VTI:            0.294 m AV Peak Grad:      7.2 mmHg AV Mean Grad:      4.0 mmHg LVOT Vmax:         107.00 cm/s LVOT Vmean:        70.600 cm/s LVOT VTI:          0.231 m LVOT/AV VTI ratio: 0.79  AORTA Ao Root diam: 2.90 cm Ao Asc diam:  3.30 cm MITRAL VALVE MV Area (PHT): 3.00 cm    SHUNTS MV Area VTI:   2.17 cm    Systemic VTI:  0.23 m MV Peak grad:  4.2 mmHg    Systemic Diam: 2.00 cm MV Mean grad:  2.0 mmHg MV Vmax:       1.02 m/s MV Vmean:      62.6 cm/s MV Decel Time: 253 msec MV E velocity: 88.60 cm/s MV A velocity: 88.60 cm/s MV E/A ratio:  1.00 Toribio Fuel MD Electronically signed by Toribio Fuel MD Signature Date/Time: 07/19/2023/3:44:05 PM    Final     Anti-infectives: Anti-infectives (From admission, onward)    Start     Dose/Rate Route Frequency Ordered Stop   07/19/23 1800  Ampicillin -Sulbactam (UNASYN ) 3 g in sodium chloride  0.9 % 100 mL IVPB        3 g 200 mL/hr over 30 Minutes Intravenous Every 6 hours 07/19/23 1335     07/18/23 0830  ceFAZolin  (ANCEF ) 3 g in sodium chloride  0.9 % 100 mL IVPB  Status:  Discontinued        3 g 200 mL/hr over 30 Minutes Intravenous  On call to O.R. 07/18/23 0818 07/18/23 1134   07/18/23 0814  sodium chloride  0.9 % with ceFAZolin  (ANCEF ) ADS Med       Note to Pharmacy: Marvin Domino E: cabinet override      07/18/23 0814 07/18/23 2029   07/17/23 1430  ceFAZolin  (ANCEF ) 3 g in sodium chloride  0.9 % 100 mL IVPB  Status:  Discontinued        3 g 200 mL/hr over 30 Minutes Intravenous On call to  O.R. 07/17/23 1352 07/17/23 1822   07/17/23 1403  sodium chloride  0.9 % with ceFAZolin  (ANCEF ) ADS Med  Status:  Discontinued       Note to Pharmacy: Jonda Yancy HERO: cabinet override      07/17/23 1403 07/17/23 1741   07/17/23 0800  piperacillin -tazobactam (ZOSYN ) IVPB 3.375 g  Status:  Discontinued        3.375 g 12.5 mL/hr over 240 Minutes Intravenous Every 8 hours 07/17/23 0759 07/19/23 1335   07/16/23 1945  piperacillin -tazobactam (ZOSYN ) IVPB 3.375 g        3.375 g 100 mL/hr over 30 Minutes Intravenous  Once 07/16/23 1934 07/16/23 2228        Assessment/Plan  Acute gangrenous cholecystitis POD#3 s/p subtotal cholecystectomy 1/9 Dr. Polly   AFVSS,WBC 9 from 15, creatinine has normalized, LFTs are pending Clinically bile leak seems to be improving -- drain lower output and more SS compared to bilious on POD#1. Continue to monitor. GI following  Continue FLD, IVF per primary Unasyn  Lovenox  daily   LOS: 3 days   I reviewed nursing notes, hospitalist notes, last 24 h vitals and pain scores, last 48 h intake and output, last 24 h labs and trends, and last 24 h imaging results.  This care required moderate level of medical decision making.   Almarie Pringle, PA-C [facility] Please see Amion for pager number during day hours 7:00am-4:30pm

## 2023-07-22 ENCOUNTER — Inpatient Hospital Stay (HOSPITAL_COMMUNITY): Payer: Medicare Other

## 2023-07-22 DIAGNOSIS — R197 Diarrhea, unspecified: Secondary | ICD-10-CM | POA: Diagnosis not present

## 2023-07-22 DIAGNOSIS — K810 Acute cholecystitis: Secondary | ICD-10-CM | POA: Diagnosis not present

## 2023-07-22 DIAGNOSIS — K838 Other specified diseases of biliary tract: Secondary | ICD-10-CM | POA: Diagnosis not present

## 2023-07-22 DIAGNOSIS — K9189 Other postprocedural complications and disorders of digestive system: Secondary | ICD-10-CM | POA: Diagnosis not present

## 2023-07-22 LAB — GLUCOSE, CAPILLARY
Glucose-Capillary: 114 mg/dL — ABNORMAL HIGH (ref 70–99)
Glucose-Capillary: 142 mg/dL — ABNORMAL HIGH (ref 70–99)
Glucose-Capillary: 160 mg/dL — ABNORMAL HIGH (ref 70–99)

## 2023-07-22 LAB — CULTURE, BLOOD (ROUTINE X 2): Special Requests: ADEQUATE

## 2023-07-22 MED ORDER — ACETAMINOPHEN 325 MG PO TABS
650.0000 mg | ORAL_TABLET | Freq: Four times a day (QID) | ORAL | Status: DC | PRN
  Administered 2023-07-22 – 2023-07-27 (×5): 650 mg via ORAL
  Filled 2023-07-22 (×5): qty 2

## 2023-07-22 MED ORDER — INSULIN ASPART 100 UNIT/ML IJ SOLN
0.0000 [IU] | Freq: Three times a day (TID) | INTRAMUSCULAR | Status: DC
  Administered 2023-07-22: 2 [IU] via SUBCUTANEOUS
  Administered 2023-07-23 – 2023-07-24 (×3): 3 [IU] via SUBCUTANEOUS
  Administered 2023-07-26 (×3): 2 [IU] via SUBCUTANEOUS
  Administered 2023-07-27: 3 [IU] via SUBCUTANEOUS
  Administered 2023-07-27: 2 [IU] via SUBCUTANEOUS

## 2023-07-22 MED ORDER — TECHNETIUM TC 99M MEBROFENIN IV KIT
5.4000 | PACK | Freq: Once | INTRAVENOUS | Status: AC | PRN
  Administered 2023-07-22: 5.4 via INTRAVENOUS

## 2023-07-22 MED ORDER — INSULIN ASPART 100 UNIT/ML IJ SOLN
0.0000 [IU] | Freq: Every day | INTRAMUSCULAR | Status: DC
  Administered 2023-07-23: 3 [IU] via SUBCUTANEOUS
  Administered 2023-07-24: 2 [IU] via SUBCUTANEOUS

## 2023-07-22 NOTE — Evaluation (Signed)
 Physical Therapy Evaluation Patient Details Name: Tricia Dodson MRN: [id number] DOB: 1951/05/12 Today's Date: 07/22/2023  History of Present Illness  73 y.o. year old female admitted with acute cholecystitis, s/p subtotal cholecystectomy 1/9. PMH indicates: history of HTN, OSA on CPAP, and obesity.  Clinical Impression  Patient lives independently at home alone. The patient was able to ambulate within her room and hallway CGA +1 with a rolling walker. The patient did not have any difficulty transferring from a sitting position to standing. However, the patient required Min A +1  transferring from sitting to supine in the bed. The patient did report mild pain when ambulating in the hallway with her rolling walker and required some rest breaks while standing before continuing to walk again. In future sessions with PT, a continued focus will be with stair negotiation in order to improve tolerance and technique with stairs when she is discharged home. During assessment, O2 sat in sitting was 96% and HR was 79bpm in sitting.       If plan is discharge home, recommend the following: Help with stairs or ramp for entrance   Can travel by private vehicle        Equipment Recommendations Rolling walker (2 wheels)  Recommendations for Other Services  OT consult    Functional Status Assessment Patient has had a recent decline in their functional status and demonstrates the ability to make significant improvements in function in a reasonable and predictable amount of time.     Precautions / Restrictions Precautions Precautions: Fall;Other (comment) (JP drain) Restrictions Weight Bearing Restrictions Per Provider Order: No      Mobility  Bed Mobility Overal bed mobility: Needs Assistance Bed Mobility: Sit to Supine       Sit to supine: Min assist   General bed mobility comments: Min A at trunk to positon in bed    Transfers Overall transfer level: Modified independent Equipment  used: Rolling walker (2 wheels)               General transfer comment: Patient was able to perform a STS using UE support from her chair    Ambulation/Gait Ambulation/Gait assistance: Contact guard assist Gait Distance (Feet): 100 Feet Assistive device: Rolling walker (2 wheels) Gait Pattern/deviations: Step-to pattern   Gait velocity interpretation: 1.31 - 2.62 ft/sec, indicative of limited community ambulator   General Gait Details: Patient reported mild pain during ambulation and requried some rest breaks while standing  Stairs            Wheelchair Mobility     Tilt Bed    Modified Rankin (Stroke Patients Only)       Balance Overall balance assessment: Independent                                           Pertinent Vitals/Pain Pain Assessment Pain Assessment: 0-10 Pain Score: 6  Pain Location: Abdomen Pain Descriptors / Indicators: Grimacing Pain Intervention(s): Monitored during session    Home Living Family/patient expects to be discharged to:: Private residence Living Arrangements: Alone Available Help at Discharge: Friend(s) Type of Home: House       Alternate Level Stairs-Number of Steps: 19 Home Layout: Two level Home Equipment: Cane - single point      Prior Function Prior Level of Function : Independent/Modified Independent  Extremity/Trunk Assessment   Upper Extremity Assessment Upper Extremity Assessment: Overall WFL for tasks assessed    Lower Extremity Assessment Lower Extremity Assessment: Overall WFL for tasks assessed    Cervical / Trunk Assessment Cervical / Trunk Assessment: Normal  Communication   Communication Communication: No apparent difficulties  Cognition Arousal: Alert Behavior During Therapy: WFL for tasks assessed/performed Overall Cognitive Status: Within Functional Limits for tasks assessed                                           General Comments General comments (skin integrity, edema, etc.): Pre-Mobility Vitals: 96% O2, 79bpm (pulse)    Exercises     Assessment/Plan    PT Assessment Patient needs continued PT services  PT Problem List Decreased activity tolerance;Decreased mobility;Pain       PT Treatment Interventions DME instruction;Gait training;Stair training;Functional mobility training;Therapeutic activities;Therapeutic exercise;Balance training;Neuromuscular re-education    PT Goals (Current goals can be found in the Care Plan section)  Acute Rehab PT Goals Patient Stated Goal: Stair Negotitation PT Goal Formulation: With patient Time For Goal Achievement: 08/05/23 Potential to Achieve Goals: Good    Frequency Min 1X/week     Co-evaluation               AM-PAC PT 6 Clicks Mobility  Outcome Measure Help needed turning from your back to your side while in a flat bed without using bedrails?: None Help needed moving from lying on your back to sitting on the side of a flat bed without using bedrails?: A Little Help needed moving to and from a bed to a chair (including a wheelchair)?: None Help needed standing up from a chair using your arms (e.g., wheelchair or bedside chair)?: None Help needed to walk in hospital room?: A Little Help needed climbing 3-5 steps with a railing? : A Little 6 Click Score: 21    End of Session Equipment Utilized During Treatment: Gait belt Activity Tolerance: Patient tolerated treatment well (Increased pain during ambulation) Patient left: in chair;with call bell/phone within reach Nurse Communication: Mobility status PT Visit Diagnosis: Other abnormalities of gait and mobility (R26.89);Pain;Difficulty in walking, not elsewhere classified (R26.2) Pain - part of body:  (Abdomen)    Time: 1030-1101 PT Time Calculation (min) (ACUTE ONLY): 31 min   Charges:   PT Evaluation $PT Eval Low Complexity: 1 Low PT Treatments $Therapeutic Activity: 8-22  mins PT General Charges $$ ACUTE PT VISIT: 1 Visit        Tricia Dodson, SPT  Tricia Dodson 07/22/2023, 1:40 PM

## 2023-07-22 NOTE — Progress Notes (Signed)
 [facility] Progress Note  4 Days Post-Op  Subjective: CC:  Abdominal pain improved, located around drain. Tolerating FLD. Denies nausea or vomiting. Having bowel function  Objective: Vital signs in last 24 hours: Temp:  [97.5 F (36.4 C)-98.7 F (37.1 C)] 98.5 F (36.9 C) (01/13 0751) Pulse Rate:  [80-91] 84 (01/13 0751) Resp:  [14-16] 16 (01/13 0751) BP: (130-144)/(76-87) 130/77 (01/13 0751) SpO2:  [93 %-97 %] 95 % (01/13 0751) Last BM Date : 07/21/23  Intake/Output from previous day: 01/12 0701 - 01/13 0700 In: -  Out: 190 [Drains:190] Intake/Output this shift: No intake/output data recorded.  PE: Gen:  Alert, NAD, pleasant Card:  Regular rate and rhythm Pulm:  non-labored Abd: Soft, TTP lower abdomen and right abdomen without guarding or peritonitis, JP appears thin golden bilious type effluent  190 cc (From 320 from 450). Well healed incisions without erythema  Skin: warm and dry, no rashes  Psych: A&Ox3   Lab Results:  Recent Labs    07/21/23 0602  WBC 9.2  HGB 12.3  HCT 39.8  PLT 394   BMET Recent Labs    07/21/23 0602  NA 139  K 3.4*  CL 104  CO2 28  GLUCOSE 166*  BUN 10  CREATININE 0.90  CALCIUM  8.1*   PT/INR No results for input(s): LABPROT, INR in the last 72 hours.  CMP     Component Value Date/Time   NA 139 07/21/2023 0602   K 3.4 (L) 07/21/2023 0602   CL 104 07/21/2023 0602   CO2 28 07/21/2023 0602   GLUCOSE 166 (H) 07/21/2023 0602   BUN 10 07/21/2023 0602   CREATININE 0.90 07/21/2023 0602   CALCIUM  8.1 (L) 07/21/2023 0602   PROT 5.8 (L) 07/21/2023 0602   ALBUMIN 2.0 (L) 07/21/2023 0602   AST 17 07/21/2023 0602   ALT 26 07/21/2023 0602   ALKPHOS 92 07/21/2023 0602   BILITOT 0.4 07/21/2023 0602   GFRNONAA >60 07/21/2023 0602   Lipase     Component Value Date/Time   LIPASE 22 07/16/2023 1522       Studies/Results: No results found.   Anti-infectives: Anti-infectives (From admission, onward)     Start     Dose/Rate Route Frequency Ordered Stop   07/19/23 1800  Ampicillin -Sulbactam (UNASYN ) 3 g in sodium chloride  0.9 % 100 mL IVPB        3 g 200 mL/hr over 30 Minutes Intravenous Every 6 hours 07/19/23 1335     07/18/23 0830  ceFAZolin  (ANCEF ) 3 g in sodium chloride  0.9 % 100 mL IVPB  Status:  Discontinued        3 g 200 mL/hr over 30 Minutes Intravenous On call to O.R. 07/18/23 0818 07/18/23 1134   07/18/23 0814  sodium chloride  0.9 % with ceFAZolin  (ANCEF ) ADS Med       Note to Pharmacy: Tricia Dodson E: cabinet override      07/18/23 0814 07/18/23 2029   07/17/23 1430  ceFAZolin  (ANCEF ) 3 g in sodium chloride  0.9 % 100 mL IVPB  Status:  Discontinued        3 g 200 mL/hr over 30 Minutes Intravenous On call to O.R. 07/17/23 1352 07/17/23 1822   07/17/23 1403  sodium chloride  0.9 % with ceFAZolin  (ANCEF ) ADS Med  Status:  Discontinued       Note to Pharmacy: Tricia Dodson HERO: cabinet override      07/17/23 1403 07/17/23 1741   07/17/23 0800  piperacillin -tazobactam (ZOSYN ) IVPB 3.375  g  Status:  Discontinued        3.375 g 12.5 mL/hr over 240 Minutes Intravenous Every 8 hours 07/17/23 0759 07/19/23 1335   07/16/23 1945  piperacillin -tazobactam (ZOSYN ) IVPB 3.375 g        3.375 g 100 mL/hr over 30 Minutes Intravenous  Once 07/16/23 1934 07/16/23 2228        Assessment/Plan  Acute gangrenous cholecystitis POD#4 s/p subtotal cholecystectomy 1/9 Dr. Polly   AFVSS,WBC 9 from 15, creatinine has normalized, LFTs are pending Clinically bile leak seems to be improving -- drain lower output and more SS compared to bilious on POD#1. Continue to monitor. GI following  Continue FLD, IVF per primary Unasyn  Suspect bile leak based on color - appears like golden clear bile to me. Reviewed with GI as well. Monitor for now, likely ERCP in near future Lovenox  daily   LOS: 4 days   Tricia Pizza, MD [facility]

## 2023-07-22 NOTE — H&P (View-Only) (Signed)
 Daily Progress Note  DOA: 07/16/2023 Hospital Day: 7  Chief Complaint:  post -op bile duct leak  ASSESSMENT    Brief Narrative:  Tricia Dodson is a 73 y.o. year old female with a history of HTN, OSA on CPAP, obesity. Admitted with acute cholecystitis, s/p subtotal cholecystectomy 1/9. GI consulted 1/10 for bile leak  Post-op bile leak Drain output has been decreasing and becoming less bilious. Total drain output yesterday was 190 ml, down from 320 ml  day prior.  LFTs have normalized. WBC has normalized  Loose stool, possible antibiotic related. Low suspicion for C-diff right now  AKI, resolved  Bacteremia - Blood cultures positive for strep anginosis.   See PMH for additional medical history   PLAN   --Getting Unasyn  --Holding off on ERCP for now as leak may be resolving --Agree with stopping Senokot ( last dose 1/12). If loose stools persists and having  > 3 loose BMs /day then consider checking for C-diff.   Addendum: Spoke with Surgeon -Dr. Teresa. He feels like patient does have a persistent bile leak and will likely need an ERCP. Discussed with Dr. Federico  / Dr. Wilhelmenia. Will plan for HIDA and als will check fluid bilirubin    Subjective   Has RUQ pain associated with movement.  Stools loose, had 3 yesterday. Was getting senokot, now d/ced.    Objective    Recent Labs    07/21/23 0602  WBC 9.2  HGB 12.3  HCT 39.8  PLT 394   BMET Recent Labs    07/21/23 0602  NA 139  K 3.4*  CL 104  CO2 28  GLUCOSE 166*  BUN 10  CREATININE 0.90  CALCIUM  8.1*   LFT Recent Labs    07/21/23 0602  PROT 5.8*  ALBUMIN 2.0*  AST 17  ALT 26  ALKPHOS 92  BILITOT 0.4  BILIDIR 0.1  IBILI 0.3   PT/INR No results for input(s): LABPROT, INR in the last 72 hours.   Imaging:  ECHOCARDIOGRAM COMPLETE    ECHOCARDIOGRAM REPORT       Patient Name:   Tricia Dodson Date of Exam: 07/19/2023 Medical Rec #:  [id number]        Height:       67.0  in Accession #:    [id number]       Weight:       252.0 lb Date of Birth:  1951/04/07        BSA:          2.231 m Patient Age:    72 years         BP:           125/75 mmHg Patient Gender: F                HR:           64 bpm. Exam Location:  Inpatient  Procedure: 2D Echo, Color Doppler and Cardiac Doppler  Indications:    Bacteremia   History:        Patient has prior history of Echocardiogram examinations, most                 recent 01/02/2021. Abnormal ECG; Risk Factors:Hypertension.   Sonographer:    Amy Chionchio Referring Phys: VANN, JESSICA, U  IMPRESSIONS   1. Left ventricular ejection fraction, by estimation, is 60 to 65%. The left ventricle has normal function. The left ventricle has no regional wall motion abnormalities. There is  mild left ventricular hypertrophy. Left ventricular diastolic parameters  are consistent with Grade I diastolic dysfunction (impaired relaxation).  2. Right ventricular systolic function is normal. The right ventricular size is normal.  3. The mitral valve is normal in structure. Trivial mitral valve regurgitation. No evidence of mitral stenosis.  4. The aortic valve is tricuspid. There is mild calcification of the aortic valve. Aortic valve regurgitation is not visualized. Aortic valve sclerosis/calcification is present, without any evidence of aortic stenosis.  5. The inferior vena cava is normal in size with greater than 50% respiratory variability, suggesting right atrial pressure of 3 mmHg.  FINDINGS  Left Ventricle: Left ventricular ejection fraction, by estimation, is 60 to 65%. The left ventricle has normal function. The left ventricle has no regional wall motion abnormalities. The left ventricular internal cavity size was normal in size. There is  mild left ventricular hypertrophy. Left ventricular diastolic parameters are consistent with Grade I diastolic dysfunction (impaired relaxation).  Right Ventricle: The right ventricular size is  normal. No increase in right ventricular wall thickness. Right ventricular systolic function is normal.  Left Atrium: Left atrial size was normal in size.  Right Atrium: Right atrial size was normal in size.  Pericardium: There is no evidence of pericardial effusion.  Mitral Valve: The mitral valve is normal in structure. There is mild calcification of the mitral valve leaflet(s). Trivial mitral valve regurgitation. No evidence of mitral valve stenosis. MV peak gradient, 4.2 mmHg. The mean mitral valve gradient is 2.0  mmHg.  Tricuspid Valve: The tricuspid valve is normal in structure. Tricuspid valve regurgitation is trivial. No evidence of tricuspid stenosis.  Aortic Valve: The aortic valve is tricuspid. There is mild calcification of the aortic valve. Aortic valve regurgitation is not visualized. Aortic valve sclerosis/calcification is present, without any evidence of aortic stenosis. Aortic valve mean  gradient measures 4.0 mmHg. Aortic valve peak gradient measures 7.2 mmHg. Aortic valve area, by VTI measures 2.47 cm.  Pulmonic Valve: The pulmonic valve was normal in structure. Pulmonic valve regurgitation is trivial. No evidence of pulmonic stenosis.  Aorta: The aortic root is normal in size and structure.  Venous: The inferior vena cava is normal in size with greater than 50% respiratory variability, suggesting right atrial pressure of 3 mmHg.  IAS/Shunts: No atrial level shunt detected by color flow Doppler.    LEFT VENTRICLE PLAX 2D LVIDd:         4.00 cm     Diastology LVIDs:         2.90 cm     LV e' medial:    7.07 cm/s LV PW:         0.90 cm     LV E/e' medial:  12.5 LV IVS:        1.10 cm     LV e' lateral:   8.05 cm/s LVOT diam:     2.00 cm     LV E/e' lateral: 11.0 LV SV:         73 LV SV Index:   33 LVOT Area:     3.14 cm   LV Volumes (MOD) LV vol d, MOD A2C: 64.0 ml LV vol d, MOD A4C: 68.0 ml LV vol s, MOD A2C: 21.1 ml LV vol s, MOD A4C: 22.6 ml LV SV MOD  A2C:     42.9 ml LV SV MOD A4C:     68.0 ml LV SV MOD BP:      43.9 ml  RIGHT VENTRICLE  IVC RV Basal diam:  3.50 cm     IVC diam: 1.60 cm RV S prime:     11.50 cm/s TAPSE (M-mode): 2.3 cm  LEFT ATRIUM             Index        RIGHT ATRIUM           Index LA Vol (A2C):   32.9 ml 14.75 ml/m  RA Area:     17.90 cm LA Vol (A4C):   46.2 ml 20.71 ml/m  RA Volume:   51.00 ml  22.86 ml/m LA Biplane Vol: 39.3 ml 17.62 ml/m  AORTIC VALVE                    PULMONIC VALVE AV Area (Vmax):    2.51 cm     PV Vmax:       0.91 m/s AV Area (Vmean):   2.32 cm     PV Peak grad:  3.3 mmHg AV Area (VTI):     2.47 cm AV Vmax:           134.00 cm/s AV Vmean:          95.600 cm/s AV VTI:            0.294 m AV Peak Grad:      7.2 mmHg AV Mean Grad:      4.0 mmHg LVOT Vmax:         107.00 cm/s LVOT Vmean:        70.600 cm/s LVOT VTI:          0.231 m LVOT/AV VTI ratio: 0.79   AORTA Ao Root diam: 2.90 cm Ao Asc diam:  3.30 cm  MITRAL VALVE MV Area (PHT): 3.00 cm    SHUNTS MV Area VTI:   2.17 cm    Systemic VTI:  0.23 m MV Peak grad:  4.2 mmHg    Systemic Diam: 2.00 cm MV Mean grad:  2.0 mmHg MV Vmax:       1.02 m/s MV Vmean:      62.6 cm/s MV Decel Time: 253 msec MV E velocity: 88.60 cm/s MV A velocity: 88.60 cm/s MV E/A ratio:  1.00  Toribio Fuel MD Electronically signed by Toribio Fuel MD Signature Date/Time: 07/19/2023/3:44:05 PM      Final       Scheduled inpatient medications:   amitriptyline   10 mg Oral QHS   atorvastatin   10 mg Oral Daily   DULoxetine   20 mg Oral Daily   enoxaparin  (LOVENOX ) injection  55 mg Subcutaneous QHS   gabapentin   300 mg Oral BID   insulin  aspart  0-15 Units Subcutaneous TID WC   insulin  aspart  0-5 Units Subcutaneous QHS   levothyroxine   25 mcg Oral Q0600   mupirocin  ointment  1 Application Nasal BID   pantoprazole   40 mg Oral Daily   senna-docusate  2 tablet Oral BID   sodium chloride  flush  3 mL Intravenous Q12H    Continuous inpatient infusions:   ampicillin -sulbactam (UNASYN ) IV 3 g (07/22/23 0530)   PRN inpatient medications: HYDROmorphone  (DILAUDID ) injection, ipratropium-albuterol , ondansetron  (ZOFRAN ) IV, mouth rinse, oxyCODONE , polyethylene glycol  Vital signs in last 24 hours: Temp:  [97.5 F (36.4 C)-98.7 F (37.1 C)] 98.5 F (36.9 C) (01/13 0751) Pulse Rate:  [80-91] 84 (01/13 0751) Resp:  [14-16] 16 (01/13 0751) BP: (130-144)/(76-87) 130/77 (01/13 0751) SpO2:  [93 %-97 %] 95 % (01/13 0751) Last BM Date : 07/21/23  Intake/Output Summary (Last 24 hours) at 07/22/2023 0936 Last data filed at 07/22/2023 0400 Gross per 24 hour  Intake --  Output 190 ml  Net -190 ml    Intake/Output from previous day: 01/12 0701 - 01/13 0700 In: -  Out: 190 [Drains:190] Intake/Output this shift: No intake/output data recorded.   Physical Exam:  General: Alert female in NAD Heart:  Regular rate and rhythm.  Pulmonary: Normal respiratory effort Abdomen: Soft, nondistended, mild RUQ tenderness,  Normal bowel sounds. Surgical drain with 50 cc of blood tinged clear fluid ( non-bilious) Extremities: No lower extremity edema  Neurologic: Alert and oriented Psych: Pleasant. Cooperative. Insight appears normal.      LOS: 4 days   Vina Dasen ,NP 07/22/2023, 9:36 AM

## 2023-07-22 NOTE — Progress Notes (Signed)
 Pt has order for ACHS blood sugar check.

## 2023-07-22 NOTE — Plan of Care (Signed)

## 2023-07-22 NOTE — Progress Notes (Addendum)
 Daily Progress Note  DOA: 07/16/2023 Hospital Day: 7  Chief Complaint:  post -op bile duct leak  ASSESSMENT    Brief Narrative:  Tricia Dodson is a 73 y.o. year old female with a history of HTN, OSA on CPAP, obesity. Admitted with acute cholecystitis, s/p subtotal cholecystectomy 1/9. GI consulted 1/10 for bile leak  Post-op bile leak Drain output has been decreasing and becoming less bilious. Total drain output yesterday was 190 ml, down from 320 ml  day prior.  LFTs have normalized. WBC has normalized  Loose stool, possible antibiotic related. Low suspicion for C-diff right now  AKI, resolved  Bacteremia - Blood cultures positive for strep anginosis.   See PMH for additional medical history   PLAN   --Getting Unasyn  --Holding off on ERCP for now as leak may be resolving --Agree with stopping Senokot ( last dose 1/12). If loose stools persists and having  > 3 loose BMs /day then consider checking for C-diff.   Addendum: Spoke with Surgeon -Dr. Teresa. He feels like patient does have a persistent bile leak and will likely need an ERCP. Discussed with Dr. Federico  / Dr. Wilhelmenia. Will plan for HIDA and als will check fluid bilirubin    Subjective   Has RUQ pain associated with movement.  Stools loose, had 3 yesterday. Was getting senokot, now d/ced.    Objective    Recent Labs    07/21/23 0602  WBC 9.2  HGB 12.3  HCT 39.8  PLT 394   BMET Recent Labs    07/21/23 0602  NA 139  K 3.4*  CL 104  CO2 28  GLUCOSE 166*  BUN 10  CREATININE 0.90  CALCIUM  8.1*   LFT Recent Labs    07/21/23 0602  PROT 5.8*  ALBUMIN 2.0*  AST 17  ALT 26  ALKPHOS 92  BILITOT 0.4  BILIDIR 0.1  IBILI 0.3   PT/INR No results for input(s): LABPROT, INR in the last 72 hours.   Imaging:  ECHOCARDIOGRAM COMPLETE    ECHOCARDIOGRAM REPORT       Patient Name:   Tricia Dodson Date of Exam: 07/19/2023 Medical Rec #:  [id number]        Height:       67.0  in Accession #:    [id number]       Weight:       252.0 lb Date of Birth:  1951/04/07        BSA:          2.231 m Patient Age:    72 years         BP:           125/75 mmHg Patient Gender: F                HR:           64 bpm. Exam Location:  Inpatient  Procedure: 2D Echo, Color Doppler and Cardiac Doppler  Indications:    Bacteremia   History:        Patient has prior history of Echocardiogram examinations, most                 recent 01/02/2021. Abnormal ECG; Risk Factors:Hypertension.   Sonographer:    Amy Chionchio Referring Phys: VANN, JESSICA, U  IMPRESSIONS   1. Left ventricular ejection fraction, by estimation, is 60 to 65%. The left ventricle has normal function. The left ventricle has no regional wall motion abnormalities. There is  mild left ventricular hypertrophy. Left ventricular diastolic parameters  are consistent with Grade I diastolic dysfunction (impaired relaxation).  2. Right ventricular systolic function is normal. The right ventricular size is normal.  3. The mitral valve is normal in structure. Trivial mitral valve regurgitation. No evidence of mitral stenosis.  4. The aortic valve is tricuspid. There is mild calcification of the aortic valve. Aortic valve regurgitation is not visualized. Aortic valve sclerosis/calcification is present, without any evidence of aortic stenosis.  5. The inferior vena cava is normal in size with greater than 50% respiratory variability, suggesting right atrial pressure of 3 mmHg.  FINDINGS  Left Ventricle: Left ventricular ejection fraction, by estimation, is 60 to 65%. The left ventricle has normal function. The left ventricle has no regional wall motion abnormalities. The left ventricular internal cavity size was normal in size. There is  mild left ventricular hypertrophy. Left ventricular diastolic parameters are consistent with Grade I diastolic dysfunction (impaired relaxation).  Right Ventricle: The right ventricular size is  normal. No increase in right ventricular wall thickness. Right ventricular systolic function is normal.  Left Atrium: Left atrial size was normal in size.  Right Atrium: Right atrial size was normal in size.  Pericardium: There is no evidence of pericardial effusion.  Mitral Valve: The mitral valve is normal in structure. There is mild calcification of the mitral valve leaflet(s). Trivial mitral valve regurgitation. No evidence of mitral valve stenosis. MV peak gradient, 4.2 mmHg. The mean mitral valve gradient is 2.0  mmHg.  Tricuspid Valve: The tricuspid valve is normal in structure. Tricuspid valve regurgitation is trivial. No evidence of tricuspid stenosis.  Aortic Valve: The aortic valve is tricuspid. There is mild calcification of the aortic valve. Aortic valve regurgitation is not visualized. Aortic valve sclerosis/calcification is present, without any evidence of aortic stenosis. Aortic valve mean  gradient measures 4.0 mmHg. Aortic valve peak gradient measures 7.2 mmHg. Aortic valve area, by VTI measures 2.47 cm.  Pulmonic Valve: The pulmonic valve was normal in structure. Pulmonic valve regurgitation is trivial. No evidence of pulmonic stenosis.  Aorta: The aortic root is normal in size and structure.  Venous: The inferior vena cava is normal in size with greater than 50% respiratory variability, suggesting right atrial pressure of 3 mmHg.  IAS/Shunts: No atrial level shunt detected by color flow Doppler.    LEFT VENTRICLE PLAX 2D LVIDd:         4.00 cm     Diastology LVIDs:         2.90 cm     LV e' medial:    7.07 cm/s LV PW:         0.90 cm     LV E/e' medial:  12.5 LV IVS:        1.10 cm     LV e' lateral:   8.05 cm/s LVOT diam:     2.00 cm     LV E/e' lateral: 11.0 LV SV:         73 LV SV Index:   33 LVOT Area:     3.14 cm   LV Volumes (MOD) LV vol d, MOD A2C: 64.0 ml LV vol d, MOD A4C: 68.0 ml LV vol s, MOD A2C: 21.1 ml LV vol s, MOD A4C: 22.6 ml LV SV MOD  A2C:     42.9 ml LV SV MOD A4C:     68.0 ml LV SV MOD BP:      43.9 ml  RIGHT VENTRICLE  IVC RV Basal diam:  3.50 cm     IVC diam: 1.60 cm RV S prime:     11.50 cm/s TAPSE (M-mode): 2.3 cm  LEFT ATRIUM             Index        RIGHT ATRIUM           Index LA Vol (A2C):   32.9 ml 14.75 ml/m  RA Area:     17.90 cm LA Vol (A4C):   46.2 ml 20.71 ml/m  RA Volume:   51.00 ml  22.86 ml/m LA Biplane Vol: 39.3 ml 17.62 ml/m  AORTIC VALVE                    PULMONIC VALVE AV Area (Vmax):    2.51 cm     PV Vmax:       0.91 m/s AV Area (Vmean):   2.32 cm     PV Peak grad:  3.3 mmHg AV Area (VTI):     2.47 cm AV Vmax:           134.00 cm/s AV Vmean:          95.600 cm/s AV VTI:            0.294 m AV Peak Grad:      7.2 mmHg AV Mean Grad:      4.0 mmHg LVOT Vmax:         107.00 cm/s LVOT Vmean:        70.600 cm/s LVOT VTI:          0.231 m LVOT/AV VTI ratio: 0.79   AORTA Ao Root diam: 2.90 cm Ao Asc diam:  3.30 cm  MITRAL VALVE MV Area (PHT): 3.00 cm    SHUNTS MV Area VTI:   2.17 cm    Systemic VTI:  0.23 m MV Peak grad:  4.2 mmHg    Systemic Diam: 2.00 cm MV Mean grad:  2.0 mmHg MV Vmax:       1.02 m/s MV Vmean:      62.6 cm/s MV Decel Time: 253 msec MV E velocity: 88.60 cm/s MV A velocity: 88.60 cm/s MV E/A ratio:  1.00  Toribio Fuel MD Electronically signed by Toribio Fuel MD Signature Date/Time: 07/19/2023/3:44:05 PM      Final       Scheduled inpatient medications:   amitriptyline   10 mg Oral QHS   atorvastatin   10 mg Oral Daily   DULoxetine   20 mg Oral Daily   enoxaparin  (LOVENOX ) injection  55 mg Subcutaneous QHS   gabapentin   300 mg Oral BID   insulin  aspart  0-15 Units Subcutaneous TID WC   insulin  aspart  0-5 Units Subcutaneous QHS   levothyroxine   25 mcg Oral Q0600   mupirocin  ointment  1 Application Nasal BID   pantoprazole   40 mg Oral Daily   senna-docusate  2 tablet Oral BID   sodium chloride  flush  3 mL Intravenous Q12H    Continuous inpatient infusions:   ampicillin -sulbactam (UNASYN ) IV 3 g (07/22/23 0530)   PRN inpatient medications: HYDROmorphone  (DILAUDID ) injection, ipratropium-albuterol , ondansetron  (ZOFRAN ) IV, mouth rinse, oxyCODONE , polyethylene glycol  Vital signs in last 24 hours: Temp:  [97.5 F (36.4 C)-98.7 F (37.1 C)] 98.5 F (36.9 C) (01/13 0751) Pulse Rate:  [80-91] 84 (01/13 0751) Resp:  [14-16] 16 (01/13 0751) BP: (130-144)/(76-87) 130/77 (01/13 0751) SpO2:  [93 %-97 %] 95 % (01/13 0751) Last BM Date : 07/21/23  Intake/Output Summary (Last 24 hours) at 07/22/2023 0936 Last data filed at 07/22/2023 0400 Gross per 24 hour  Intake --  Output 190 ml  Net -190 ml    Intake/Output from previous day: 01/12 0701 - 01/13 0700 In: -  Out: 190 [Drains:190] Intake/Output this shift: No intake/output data recorded.   Physical Exam:  General: Alert female in NAD Heart:  Regular rate and rhythm.  Pulmonary: Normal respiratory effort Abdomen: Soft, nondistended, mild RUQ tenderness,  Normal bowel sounds. Surgical drain with 50 cc of blood tinged clear fluid ( non-bilious) Extremities: No lower extremity edema  Neurologic: Alert and oriented Psych: Pleasant. Cooperative. Insight appears normal.      LOS: 4 days   Vina Dasen ,NP 07/22/2023, 9:36 AM

## 2023-07-22 NOTE — Progress Notes (Signed)
 PROGRESS NOTE    Tricia Dodson  FMW:[id number] DOB: 18-Jan-1951 DOA: 07/16/2023 PCP: Jason Leita Repine, FNP    Brief Narrative:  Tricia Dodson is a 73 y.o. female with medical history significant of obstructive sleep apnea, CPAP use, obesity.  Patient reports being in her usual state of health till approximately 4 days ago when she reports an abrupt onset of right upper quadrant abdominal pain with slight radiation posteriorly laterally.  This was associated with marked diminishment of appetite.  No report of fever or vomiting or diarrhea.  Patient actually has been constipated.  Patient actually presented to [facility] ER yesterday with above complaints.  White blood cell of 17.2 lipase within normal limits.  AST ALT slightly elevated at 59 and 71 respectively.  Lactic acid of 1.4.  Ultrasound abdomen showed sonographic Murphy sign compatible with acute cholecystitis. S/p complicated cholecystectomy and is being worked up for a ? Bile leak.   Assessment and Plan:  * Acute cholecystitis -s/p subtotal GB removal by GS -drain in place - WBC Normalized -? Bile leak-- GI consulted for ? Need for ERCP-- prior to that will get HIDA and will check fluid bilirubin    AKI (acute kidney injury) (HCC) -resolved  Blood culture with strep anginosis -on abx -suspect related to GB -IV abx -echo shows no endocarditis -repeat blood cultures- NGTD    Hypertension -BP controlled   OSA - (continuous positive airway pressure) dependence -CPAP   Hypokalemia -replete   Patient has chronic shingle pain - cw. Gabapentin   Hypothyroid - c.w. synthroid .  Obesity Estimated body mass index is 39.47 kg/m as calculated from the following:   Height as of this encounter: 5' 7 (1.702 m).   Weight as of this encounter: 114.3 kg.   DVT prophylaxis: SCDs Start: 07/17/23 1913    Code Status: Full Code   Disposition Plan:  Level of care: Med-Surg Status is: inpt     Consultants:  GS GI   Subjective: Continues with pain  Objective: Vitals:   07/21/23 1543 07/21/23 1942 07/22/23 0405 07/22/23 0751  BP: 130/80 137/76 (!) 144/87 130/77  Pulse: 80 83 91 84  Resp: 16 16 14 16   Temp: 98.7 F (37.1 C) (!) 97.5 F (36.4 C) 98.2 F (36.8 C) 98.5 F (36.9 C)  TempSrc:  Oral Oral Oral  SpO2: 96% 97% 93% 95%  Weight:      Height:        Intake/Output Summary (Last 24 hours) at 07/22/2023 1057 Last data filed at 07/22/2023 0400 Gross per 24 hour  Intake --  Output 190 ml  Net -190 ml   Filed Weights   07/16/23 1318 07/17/23 1418  Weight: 115 kg 114.3 kg    Examination:   General: Appearance:    Obese female in no acute distress     Lungs:      respirations unlabored  Heart:    Normal heart rate. Normal rhythm. No murmurs, rubs, or gallops.   MS:   All extremities are intact.   Neurologic:   Awake, alert, oriented x 3      Data Reviewed: I have personally reviewed following labs and imaging studies  CBC: Recent Labs  Lab 07/16/23 1522 07/17/23 1952 07/18/23 0548 07/19/23 0632 07/21/23 0602  WBC 17.2* 14.6* 13.6* 15.9* 9.2  NEUTROABS 13.5*  --   --   --   --   HGB 12.9 12.5 11.7* 11.8* 12.3  HCT 40.3 40.3 36.4 37.1 39.8  MCV  85.0 86.5 84.7 85.3 87.1  PLT 409* 414* 407* 441* 394   Basic Metabolic Panel: Recent Labs  Lab 07/16/23 1522 07/17/23 1952 07/18/23 0548 07/19/23 0632 07/21/23 0602  NA 137  --  137 139 139  K 4.2  --  3.5 3.9 3.4*  CL 104  --  102 100 104  CO2 23  --  23 24 28   GLUCOSE 121*  --  131* 179* 166*  BUN 13  --  20 17 10   CREATININE 1.16* 1.25* 1.22* 1.12* 0.90  CALCIUM  8.9  --  8.4* 8.4* 8.1*   GFR: Estimated Creatinine Clearance: 73.8 mL/min (by C-G formula based on SCr of 0.9 mg/dL). Liver Function Tests: Recent Labs  Lab 07/16/23 1522 07/18/23 0548 07/19/23 0854 07/21/23 0602  AST 59* 28 43* 17  ALT 71* 43 52* 26  ALKPHOS 112 107 109 92  BILITOT 0.8 1.0 0.6 0.4  PROT 7.8 6.2*  6.6 5.8*  ALBUMIN 3.3* 2.2* 2.3* 2.0*   Recent Labs  Lab 07/16/23 1522  LIPASE 22   No results for input(s): AMMONIA in the last 168 hours. Coagulation Profile: Recent Labs  Lab 07/18/23 0548  INR 1.2   Cardiac Enzymes: No results for input(s): CKTOTAL, CKMB, CKMBINDEX, TROPONINI in the last 168 hours. BNP (last 3 results) No results for input(s): PROBNP in the last 8760 hours. HbA1C: No results for input(s): HGBA1C in the last 72 hours. CBG: Recent Labs  Lab 07/17/23 1422 07/18/23 0755 07/18/23 1024  GLUCAP 128* 125* 127*   Lipid Profile: No results for input(s): CHOL, HDL, LDLCALC, TRIG, CHOLHDL, LDLDIRECT in the last 72 hours. Thyroid  Function Tests: No results for input(s): TSH, T4TOTAL, FREET4, T3FREE, THYROIDAB in the last 72 hours. Anemia Panel: No results for input(s): VITAMINB12, FOLATE, FERRITIN, TIBC, IRON, RETICCTPCT in the last 72 hours. Sepsis Labs: Recent Labs  Lab 07/16/23 2015  LATICACIDVEN 1.4    Recent Results (from the past 240 hours)  Blood culture (routine x 2)     Status: Abnormal   Collection Time: 07/16/23  8:14 PM   Specimen: Right Antecubital; Blood  Result Value Ref Range Status   Specimen Description   Final    RIGHT ANTECUBITAL BLOOD Performed at [facility], [address], [place], [place] [address]    Special Requests   Final    BOTTLES DRAWN AEROBIC AND ANAEROBIC Blood Culture adequate volume Performed at [facility], [address] [place], [place] [address]    Culture  Setup Time   Final    GRAM POSITIVE COCCI AEROBIC BOTTLE ONLY CRITICAL RESULT CALLED TO, READ BACK BY AND VERIFIED WITH: PHARMD J LEDFORD 07/18/2023 @ 0151 BY AB Performed at [facility], [address], [place], [place] [address]    Culture STREPTOCOCCUS ANGINOSIS (A)  Final   Report Status 07/20/2023 FINAL  Final   Organism ID, Bacteria STREPTOCOCCUS ANGINOSIS  Final       Susceptibility   Streptococcus anginosis - MIC*    PENICILLIN <=0.06 SENSITIVE Sensitive     CEFTRIAXONE <=0.12 SENSITIVE Sensitive     ERYTHROMYCIN <=0.12 SENSITIVE Sensitive     LEVOFLOXACIN  0.5 SENSITIVE Sensitive     VANCOMYCIN 0.5 SENSITIVE Sensitive     * STREPTOCOCCUS ANGINOSIS  Blood culture (routine x 2)     Status: Abnormal   Collection Time: 07/16/23  8:14 PM   Specimen: Left Antecubital; Blood  Result Value Ref Range Status   Specimen Description  Final    LEFT ANTECUBITAL Performed at [facility], [address] [place], [place] [address]    Special Requests   Final    BOTTLES DRAWN AEROBIC AND ANAEROBIC Blood Culture adequate volume Performed at [facility], [address] [place], [place] [address]    Culture  Setup Time   Final    GRAM POSITIVE COCCI AEROBIC BOTTLE ONLY CRITICAL RESULT CALLED TO, READ BACK BY AND VERIFIED WITH: PHARMD ANDREW MEYER ON 07/18/23 @ 2021 BY DRT    Culture (A)  Final    STAPHYLOCOCCUS CAPITIS THE SIGNIFICANCE OF ISOLATING THIS ORGANISM FROM A SINGLE SET OF BLOOD CULTURES WHEN MULTIPLE SETS ARE DRAWN IS UNCERTAIN. PLEASE NOTIFY THE MICROBIOLOGY DEPARTMENT WITHIN ONE WEEK IF SPECIATION AND SENSITIVITIES ARE REQUIRED. Performed at [facility], [address], [place], [place] [address]    Report Status 07/22/2023 FINAL  Final  Blood Culture ID Panel (Reflexed)     Status: Abnormal   Collection Time: 07/16/23  8:14 PM  Result Value Ref Range Status   Enterococcus faecalis NOT DETECTED NOT DETECTED Final   Enterococcus Faecium NOT DETECTED NOT DETECTED Final   Listeria monocytogenes NOT DETECTED NOT DETECTED Final   Staphylococcus species NOT DETECTED NOT DETECTED Final   Staphylococcus aureus (BCID) NOT DETECTED NOT DETECTED Final   Staphylococcus epidermidis NOT DETECTED NOT DETECTED Final   Staphylococcus lugdunensis NOT DETECTED NOT DETECTED Final   Streptococcus species DETECTED (A) NOT DETECTED Final     Comment: Not Enterococcus species, Streptococcus agalactiae, Streptococcus pyogenes, or Streptococcus pneumoniae. CRITICAL RESULT CALLED TO, READ BACK BY AND VERIFIED WITH: PHARMD J LEDFORD 07/18/2023 @ 0151 BY AB    Streptococcus agalactiae NOT DETECTED NOT DETECTED Final   Streptococcus pneumoniae NOT DETECTED NOT DETECTED Final   Streptococcus pyogenes NOT DETECTED NOT DETECTED Final   A.calcoaceticus-baumannii NOT DETECTED NOT DETECTED Final   Bacteroides fragilis NOT DETECTED NOT DETECTED Final   Enterobacterales NOT DETECTED NOT DETECTED Final   Enterobacter cloacae complex NOT DETECTED NOT DETECTED Final   Escherichia coli NOT DETECTED NOT DETECTED Final   Klebsiella aerogenes NOT DETECTED NOT DETECTED Final   Klebsiella oxytoca NOT DETECTED NOT DETECTED Final   Klebsiella pneumoniae NOT DETECTED NOT DETECTED Final   Proteus species NOT DETECTED NOT DETECTED Final   Salmonella species NOT DETECTED NOT DETECTED Final   Serratia marcescens NOT DETECTED NOT DETECTED Final   Haemophilus influenzae NOT DETECTED NOT DETECTED Final   Neisseria meningitidis NOT DETECTED NOT DETECTED Final   Pseudomonas aeruginosa NOT DETECTED NOT DETECTED Final   Stenotrophomonas maltophilia NOT DETECTED NOT DETECTED Final   Candida albicans NOT DETECTED NOT DETECTED Final   Candida auris NOT DETECTED NOT DETECTED Final   Candida glabrata NOT DETECTED NOT DETECTED Final   Candida krusei NOT DETECTED NOT DETECTED Final   Candida parapsilosis NOT DETECTED NOT DETECTED Final   Candida tropicalis NOT DETECTED NOT DETECTED Final   Cryptococcus neoformans/gattii NOT DETECTED NOT DETECTED Final    Comment: Performed at [facility], [address] [place], [place] [address]  Surgical pcr screen     Status: Abnormal   Collection Time: 07/18/23  3:44 AM   Specimen: Nasal Mucosa; Nasal Swab  Result Value Ref Range Status   MRSA, PCR NEGATIVE NEGATIVE Final   Staphylococcus aureus POSITIVE (A)  NEGATIVE Final    Comment: (NOTE) The Xpert SA Assay (FDA approved for NASAL specimens in patients 22 years of  age and older), is one component of a comprehensive surveillance program. It is not intended to diagnose infection nor to guide or monitor treatment. Performed at [facility], [address] [place], [place] [address]   Blood Culture ID Panel (Reflexed)     Status: Abnormal   Collection Time: 07/18/23  8:14 PM  Result Value Ref Range Status   Enterococcus faecalis NOT DETECTED NOT DETECTED Final   Enterococcus Faecium NOT DETECTED NOT DETECTED Final   Listeria monocytogenes NOT DETECTED NOT DETECTED Final   Staphylococcus species DETECTED (A) NOT DETECTED Final    Comment: CRITICAL RESULT CALLED TO, READ BACK BY AND VERIFIED WITH: PHARMD ANDREW MEYER ON 07/18/23 @ 2038 BY DRT    Staphylococcus aureus (BCID) NOT DETECTED NOT DETECTED Final   Staphylococcus epidermidis NOT DETECTED NOT DETECTED Final   Staphylococcus lugdunensis NOT DETECTED NOT DETECTED Final   Streptococcus species NOT DETECTED NOT DETECTED Final   Streptococcus agalactiae NOT DETECTED NOT DETECTED Corrected    Comment: CORRECTED ON 01/10 AT 0815: PREVIOUSLY REPORTED AS NOT DETECTED CRITICAL RESULT CALLED TO, READ BACK BY AND VERIFIED WITH: PHARMD ANDREW MEYER ON 07/18/23 @ 2038 BY DRT   Streptococcus pneumoniae NOT DETECTED NOT DETECTED Final   Streptococcus pyogenes NOT DETECTED NOT DETECTED Final   A.calcoaceticus-baumannii NOT DETECTED NOT DETECTED Final   Bacteroides fragilis NOT DETECTED NOT DETECTED Final   Enterobacterales NOT DETECTED NOT DETECTED Final   Enterobacter cloacae complex NOT DETECTED NOT DETECTED Final   Escherichia coli NOT DETECTED NOT DETECTED Final   Klebsiella aerogenes NOT DETECTED NOT DETECTED Final   Klebsiella oxytoca NOT DETECTED NOT DETECTED Final   Klebsiella pneumoniae NOT DETECTED NOT DETECTED Final   Proteus species NOT DETECTED NOT DETECTED Final   Salmonella  species NOT DETECTED NOT DETECTED Final   Serratia marcescens NOT DETECTED NOT DETECTED Final   Haemophilus influenzae NOT DETECTED NOT DETECTED Final   Neisseria meningitidis NOT DETECTED NOT DETECTED Final   Pseudomonas aeruginosa NOT DETECTED NOT DETECTED Final   Stenotrophomonas maltophilia NOT DETECTED NOT DETECTED Final   Candida albicans NOT DETECTED NOT DETECTED Final   Candida auris NOT DETECTED NOT DETECTED Final   Candida glabrata NOT DETECTED NOT DETECTED Final   Candida krusei NOT DETECTED NOT DETECTED Final   Candida parapsilosis NOT DETECTED NOT DETECTED Final   Candida tropicalis NOT DETECTED NOT DETECTED Final   Cryptococcus neoformans/gattii NOT DETECTED NOT DETECTED Final    Comment: Performed at [facility], [address], [place], [place] [address]  Culture, blood (Routine X 2) w Reflex to ID Panel     Status: None (Preliminary result)   Collection Time: 07/20/23  4:56 AM   Specimen: BLOOD  Result Value Ref Range Status   Specimen Description BLOOD BLOOD RIGHT HAND  Final   Special Requests   Final    BOTTLES DRAWN AEROBIC AND ANAEROBIC Blood Culture results may not be optimal due to an inadequate volume of blood received in culture bottles   Culture   Final    NO GROWTH 2 DAYS Performed at [facility], [address], [place], [place] [address]    Report Status PENDING  Incomplete  Culture, blood (Routine X 2) w Reflex to ID Panel     Status: None (Preliminary result)   Collection Time: 07/20/23  4:57 AM   Specimen: BLOOD  Result Value Ref Range Status   Specimen Description BLOOD BLOOD RIGHT HAND  Final   Special  Requests   Final    BOTTLES DRAWN AEROBIC AND ANAEROBIC Blood Culture results may not be optimal due to an inadequate volume of blood received in culture bottles   Culture   Final    NO GROWTH 2 DAYS Performed at [facility], [address] [place], [place] [address]    Report Status PENDING  Incomplete          Radiology Studies: No results found.       Scheduled Meds:  amitriptyline   10 mg Oral QHS   atorvastatin   10 mg Oral Daily   DULoxetine   20 mg Oral Daily   enoxaparin  (LOVENOX ) injection  55 mg Subcutaneous QHS   gabapentin   300 mg Oral BID   insulin  aspart  0-15 Units Subcutaneous TID WC   insulin  aspart  0-5 Units Subcutaneous QHS   levothyroxine   25 mcg Oral Q0600   mupirocin  ointment  1 Application Nasal BID   pantoprazole   40 mg Oral Daily   senna-docusate  2 tablet Oral BID   sodium chloride  flush  3 mL Intravenous Q12H   Continuous Infusions:  ampicillin -sulbactam (UNASYN ) IV 3 g (07/22/23 0530)     LOS: 4 days    Time spent: 45 minutes spent on chart review, discussion with nursing staff, consultants, updating family and interview/physical exam; more than 50% of that time was spent in counseling and/or coordination of care.    Tricia RAYMOND Bowl, DO [facility] Available via Epic secure chat 7am-7pm After these hours, please refer to coverage provider listed on [email or web address] 07/22/2023, 10:57 AM

## 2023-07-22 NOTE — TOC Progression Note (Addendum)
 Transition of Care (TOC) - Progression Note   PT worked with PT recommended rolling walker and HHPT , also OT consult.   Patient lives alone has friends who can assist however they do work.   Patient agreeable to home health PT . Has no DME at home will need rolling walker. Await OT recommendations.   Burnard with [facility] accepted referral for home health will need orders. Await OT consult  Patient Details  Name: Tricia Dodson MRN: [id number] Date of Birth: [birth year]  Transition of Care [facility]) CM/SW Contact  Faraaz Wolin, Powell Jansky, RN Phone Number: 07/22/2023, 4:32 PM  Clinical Narrative:       Expected Discharge Plan: Home/Self Care Barriers to Discharge: Continued Medical Work up  Expected Discharge Plan and Services   Discharge Planning Services: CM Consult Post Acute Care Choice: NA Living arrangements for the past 2 months: Single Family Home                 DME Arranged: N/A DME Agency: NA       HH Arranged: NA HH Agency: NA         Social Determinants of Health (SDOH) Interventions SDOH Screenings   Food Insecurity: Unknown (07/17/2023)  Housing: Low Risk  (07/17/2023)  Transportation Needs: No Transportation Needs (07/17/2023)  Utilities: Not At Risk (07/17/2023)  Alcohol Screen: Low Risk  (07/10/2023)  Depression (PHQ2-9): Low Risk  (06/18/2023)  Financial Resource Strain: Patient Declined (07/10/2023)  Physical Activity: Insufficiently Active (07/10/2023)  Social Connections: Socially Isolated (07/17/2023)  Stress: No Stress Concern Present (07/10/2023)  Tobacco Use: Medium Risk (07/18/2023)    Readmission Risk Interventions     No data to display

## 2023-07-22 NOTE — Progress Notes (Signed)
 Pt has hx of diabetes mellitus type 2. Pt confirms that she currently takes jardiance daily at home for diabetes mellitus type 2. Notified Dr. Marlin Canary and request an order for blood sugar check.

## 2023-07-22 NOTE — Progress Notes (Signed)
 Patient refused CPAP for the night

## 2023-07-22 NOTE — Plan of Care (Signed)
  Problem: Health Behavior/Discharge Planning: Goal: Ability to manage health-related needs will improve Outcome: Progressing   Problem: Clinical Measurements: Goal: Ability to maintain clinical measurements within normal limits will improve Outcome: Progressing Goal: Will remain free from infection Outcome: Progressing Goal: Respiratory complications will improve Outcome: Progressing   Problem: Activity: Goal: Risk for activity intolerance will decrease Outcome: Progressing   Problem: Nutrition: Goal: Adequate nutrition will be maintained Outcome: Progressing   Problem: Coping: Goal: Level of anxiety will decrease Outcome: Progressing

## 2023-07-23 ENCOUNTER — Emergency Department (HOSPITAL_COMMUNITY): Payer: Medicare Other | Admitting: Anesthesiology

## 2023-07-23 ENCOUNTER — Encounter (HOSPITAL_COMMUNITY): Payer: Self-pay | Admitting: Internal Medicine

## 2023-07-23 ENCOUNTER — Encounter (HOSPITAL_COMMUNITY)
Admission: EM | Disposition: A | Discharge status: Home Health | Payer: Self-pay | Source: Home / Self Care | Attending: Internal Medicine

## 2023-07-23 ENCOUNTER — Inpatient Hospital Stay (HOSPITAL_COMMUNITY): Payer: Medicare Other

## 2023-07-23 ENCOUNTER — Ambulatory Visit: Payer: Medicare Other | Admitting: Family

## 2023-07-23 DIAGNOSIS — I10 Essential (primary) hypertension: Secondary | ICD-10-CM

## 2023-07-23 DIAGNOSIS — K2970 Gastritis, unspecified, without bleeding: Secondary | ICD-10-CM

## 2023-07-23 DIAGNOSIS — K838 Other specified diseases of biliary tract: Secondary | ICD-10-CM | POA: Diagnosis not present

## 2023-07-23 DIAGNOSIS — K3189 Other diseases of stomach and duodenum: Secondary | ICD-10-CM | POA: Diagnosis not present

## 2023-07-23 DIAGNOSIS — K839 Disease of biliary tract, unspecified: Secondary | ICD-10-CM | POA: Diagnosis not present

## 2023-07-23 DIAGNOSIS — K9189 Other postprocedural complications and disorders of digestive system: Secondary | ICD-10-CM | POA: Diagnosis not present

## 2023-07-23 DIAGNOSIS — K810 Acute cholecystitis: Secondary | ICD-10-CM | POA: Diagnosis not present

## 2023-07-23 DIAGNOSIS — Z87891 Personal history of nicotine dependence: Secondary | ICD-10-CM

## 2023-07-23 HISTORY — PX: PANCREATIC STENT PLACEMENT: SHX5539

## 2023-07-23 HISTORY — PX: ENDOSCOPIC RETROGRADE CHOLANGIOPANCREATOGRAPHY (ERCP) WITH PROPOFOL: SHX5810

## 2023-07-23 HISTORY — PX: REMOVAL OF STONES: SHX5545

## 2023-07-23 HISTORY — PX: SPHINCTEROTOMY: SHX5544

## 2023-07-23 HISTORY — PX: BILIARY STENT PLACEMENT: SHX5538

## 2023-07-23 LAB — GLUCOSE, CAPILLARY
Glucose-Capillary: 115 mg/dL — ABNORMAL HIGH (ref 70–99)
Glucose-Capillary: 121 mg/dL — ABNORMAL HIGH (ref 70–99)
Glucose-Capillary: 190 mg/dL — ABNORMAL HIGH (ref 70–99)
Glucose-Capillary: 186 mg/dL — ABNORMAL HIGH (ref 70–99)
Glucose-Capillary: 289 mg/dL — ABNORMAL HIGH (ref 70–99)

## 2023-07-23 LAB — COMPREHENSIVE METABOLIC PANEL
ALT: 23 U/L (ref 0–44)
AST: 24 U/L (ref 15–41)
Albumin: 2.3 g/dL — ABNORMAL LOW (ref 3.5–5.0)
Alkaline Phosphatase: 89 U/L (ref 38–126)
Anion gap: 10 (ref 5–15)
BUN: <5 mg/dL — ABNORMAL LOW (ref 8–23)
CO2: 27 mmol/L (ref 22–32)
Calcium: 8.4 mg/dL — ABNORMAL LOW (ref 8.9–10.3)
Chloride: 105 mmol/L (ref 98–111)
Creatinine, Ser: 0.84 mg/dL (ref 0.44–1.00)
GFR, Estimated: >60 mL/min (ref >60)
Glucose, Bld: 133 mg/dL — ABNORMAL HIGH (ref 70–99)
Potassium: 3.9 mmol/L (ref 3.5–5.1)
Sodium: 142 mmol/L (ref 135–145)
Total Bilirubin: 0.5 mg/dL (ref 0.0–1.2)
Total Protein: 5.8 g/dL — ABNORMAL LOW (ref 6.5–8.1)

## 2023-07-23 LAB — CBC
HCT: 39.8 % (ref 36.0–46.0)
Hemoglobin: 12.7 g/dL (ref 12.0–15.0)
MCH: 27.4 pg (ref 26.0–34.0)
MCHC: 31.9 g/dL (ref 30.0–36.0)
MCV: 86 fL (ref 80.0–100.0)
Platelets: 361 10*3/uL (ref 150–400)
RBC: 4.63 MIL/uL (ref 3.87–5.11)
RDW: 13 % (ref 11.5–15.5)
WBC: 10.5 10*3/uL (ref 4.0–10.5)
nRBC: 0 % (ref 0.0–0.2)

## 2023-07-23 SURGERY — ENDOSCOPIC RETROGRADE CHOLANGIOPANCREATOGRAPHY (ERCP) WITH PROPOFOL
Anesthesia: General

## 2023-07-23 MED ORDER — GLUCAGON HCL RDNA (DIAGNOSTIC) 1 MG IJ SOLR
INTRAMUSCULAR | Status: AC
  Filled 2023-07-23: qty 1

## 2023-07-23 MED ORDER — SUGAMMADEX SODIUM 200 MG/2ML IV SOLN
INTRAVENOUS | Status: DC | PRN
  Administered 2023-07-23: 400 mg via INTRAVENOUS

## 2023-07-23 MED ORDER — ONDANSETRON HCL 4 MG/2ML IJ SOLN
INTRAMUSCULAR | Status: DC | PRN
  Administered 2023-07-23: 4 mg via INTRAVENOUS

## 2023-07-23 MED ORDER — PROPOFOL 10 MG/ML IV BOLUS
INTRAVENOUS | Status: DC | PRN
  Administered 2023-07-23: 120 mg via INTRAVENOUS

## 2023-07-23 MED ORDER — DICLOFENAC SUPPOSITORY 100 MG
RECTAL | Status: AC
  Filled 2023-07-23: qty 1

## 2023-07-23 MED ORDER — FENTANYL CITRATE (PF) 250 MCG/5ML IJ SOLN
INTRAMUSCULAR | Status: DC | PRN
  Administered 2023-07-23: 50 ug via INTRAVENOUS

## 2023-07-23 MED ORDER — LIDOCAINE 2% (20 MG/ML) 5 ML SYRINGE
INTRAMUSCULAR | Status: DC | PRN
  Administered 2023-07-23: 60 mg via INTRAVENOUS

## 2023-07-23 MED ORDER — SODIUM CHLORIDE 0.9 % IV SOLN
INTRAVENOUS | Status: DC | PRN
  Administered 2023-07-23: 15 mL

## 2023-07-23 MED ORDER — DICLOFENAC SUPPOSITORY 100 MG
RECTAL | Status: DC | PRN
  Administered 2023-07-23: 100 mg via RECTAL

## 2023-07-23 MED ORDER — DEXAMETHASONE SODIUM PHOSPHATE 10 MG/ML IJ SOLN
INTRAMUSCULAR | Status: DC | PRN
  Administered 2023-07-23: 8 mg via INTRAVENOUS

## 2023-07-23 MED ORDER — ROCURONIUM BROMIDE 10 MG/ML (PF) SYRINGE
PREFILLED_SYRINGE | INTRAVENOUS | Status: DC | PRN
  Administered 2023-07-23: 60 mg via INTRAVENOUS
  Administered 2023-07-23: 15 mg via INTRAVENOUS

## 2023-07-23 MED ORDER — FENTANYL CITRATE (PF) 100 MCG/2ML IJ SOLN
INTRAMUSCULAR | Status: AC
  Filled 2023-07-23: qty 2

## 2023-07-23 MED ORDER — GLUCAGON HCL RDNA (DIAGNOSTIC) 1 MG IJ SOLR
INTRAMUSCULAR | Status: DC | PRN
  Administered 2023-07-23 (×2): .25 mg via INTRAVENOUS

## 2023-07-23 MED ORDER — LACTATED RINGERS IV SOLN: INTRAVENOUS | Status: DC | PRN

## 2023-07-23 NOTE — Op Note (Signed)
 [facility] Patient Name: Tricia Dodson Procedure Date : 07/23/2023 MRN: [id number] Attending MD: Aloha Finner , MD, [id number] Date of Birth: 09-26-1950 CSN: [id number] Age: 73 Admit Type: Inpatient Procedure:                ERCP Indications:              Bile leak, Abnormal hepatobiliary scintigraphy Providers:                Aloha Finner, MD, Particia Fischer, RN, Mliss Eagles, RN, Corene Southgate, Technician Referring MD:             Inpatient medical service, inpatient surgical                            service Medicines:                General Anesthesia, Diclofenac  100 mg rectal,                            Glucagon  0.75 mg IV, patient is already on                            scheduled antibiotics so no antibiotics given                            during procedure Complications:            No immediate complications. Estimated blood loss:                            Minimal. Estimated Blood Loss:     Estimated blood loss was minimal. Procedure:                Pre-Anesthesia Assessment:                           - Prior to the procedure, a History and Physical                            was performed, and patient medications and                            allergies were reviewed. The patient's tolerance of                            previous anesthesia was also reviewed. The risks                            and benefits of the procedure and the sedation                            options and risks were discussed with the patient.                            All questions  were answered, and informed consent                            was obtained. Prior Anticoagulants: The patient has                            taken Lovenox  (enoxaparin ), last dose was 2 days                            prior to procedure. ASA Grade Assessment: III - A                            patient with severe systemic disease. After                             reviewing the risks and benefits, the patient was                            deemed in satisfactory condition to undergo the                            procedure.                           After obtaining informed consent, the scope was                            passed under direct vision. Throughout the                            procedure, the patient's blood pressure, pulse, and                            oxygen saturations were monitored continuously. The                            [employer] D single use                            duodenoscope was introduced through the mouth, and                            used to inject contrast into and used to inject                            contrast into the bile duct and ventral pancreatic                            duct. The ERCP was determined to be ASGE Difficulty                            Grade 3 due to challenging cannulation, challenging  cannulation because of inadequate scope positioning                            and challenging cannulation because of                            intradiverticular papilla. Successful completion of                            the procedure was aided by performing the maneuvers                            documented (below) in this report. The patient                            tolerated the procedure. Scope In: Scope Out: Findings:      A scout film of the abdomen was obtained. One percutaneous drain ending       in the Right upper quadrant was seen.      The upper GI tract was traversed under direct vision without detailed       examination. Diffuse moderately erythematous mucosa was found in the       entire examined stomach - biopsied for H. pylori assessment. The first       portion of the duodenum and in the second portion of the duodenum. 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 In short       position and long position, I  could not visualize the major papilla. In       a semilong position I was able to finally visualize the papilla. It       appeared congested. With the positioning completely intra diverticular,       I could not cannulate the bile duct with the Hydratome sphincterotome.       To assist in trying to access the biliary tree, I placed 2 clips to pull       the papilla closer to the edge/rim with success.      Repeated attempts at biliary cannulation were not successful while using       a wire-guided approach. However, this eventually led to placement of the       wire within the pancreatic duct. Decision was made to pursue/attempt a       double-wire approach. The 0.035 inch x 260 cm straight Hydra Jagwire was       left within the pancreatic duct.      After bowing the sphincterotome more significantly, a second 0.035 inch       x 260 cm straight Hydra Jagwire was passed into the biliary tree. The       Hydratome sphincterotome was passed over the guidewire and the bile duct       was then deeply cannulated. Contrast was injected. I personally       interpreted the bile duct images. Ductal flow of contrast was adequate.       Image quality was adequate. Contrast extended to the hepatic ducts.       Opacification of the entire biliary tree except for the gallbladder was       successful. The maximum diameter of the ducts was 9 mm.  Extravasation of       contrast originating from the cystic duct was observed. I performed a       very small 3 mm biliary sphincterotomy, since the papilla was completely       intradiverticul, and this was was made with a monofilament Hydratome       sphincterotome using ERBE electrocautery. There was self limited oozing       from the sphincterotomy which led us  placing a retrieval balloon to       perform tamponade (approximately 3 minutes of placement) and complete       cessation of bleeding was noted. Procedure continued for an additional       10 minutes  without any evidence of further bleeding. To discover       objects, the biliary tree was swept with a retrieval balloon. Sludge was       swept from the duct. An occlusion cholangiogram was performed that       showed no further significant biliary pathology other than continued       extravasation from the cystic duct.      A formal pancreatogram was not performed. One 4 Fr by 3 cm temporary       plastic pancreatic stent with a single external pigtail was placed into       the ventral pancreatic duct. The stent was in good position.      One 10 Fr by 7 cm plastic biliary stent with a single external flap and       a single internal flap was placed into the common bile duct. The stent       was in good position.      The duodenoscope was withdrawn from the patient. Impression:               - Erythematous mucosa in the stomach - biopsied for                            H. pylori assessment.                           - No gross lesions in the duodenal bulb, in the                            first portion of the duodenum and in the second                            portion of the duodenum.                           - The major papilla was located entirely within a                            diverticulum. When finally visualized, it appeared                            congested.                           - Two hemostatic clips were successfully placed (MR  conditional) in the area of the papilla in order to                            pull it out of the diverticulum with success.                           - Double wire technique as documented above                            required to enter the biliary tree.                           - A bile leak was found.                           - A biliary sphincterotomy was performed with mild                            oozing noted which stabilized after tamponade with                            a retrieval balloon to the  sphincterotomy site                            (more than 10 minutes of continued procedure showed                            no evidence of persisting bleeding).                           - The biliary tree was swept and sludge was found.                           - One temporary plastic pancreatic stent was placed                            into the ventral pancreatic duct.                           - One plastic biliary stent was placed into the                            common bile duct. Recommendation:           - The patient will be observed post-procedure,                            until all discharge criteria are met.                           - Return patient to hospital ward for ongoing care.                           - Observe patient's clinical course.                           -  Check liver enzymes (AST, ALT, alkaline                            phosphatase, bilirubin) in the morning.                           - Watch for pancreatitis, bleeding, perforation,                            and cholangitis.                           - Monitor patient for decreasing drain output over                            the course of the coming weeks. Hopeful that                            plastic biliary stenting will be enough, but if                            not, multiple stents versus fully covered                            self-expanding metal stent placement may be                            required.                           - Would hold chemical VTE prophylaxis for 36-48                            hours to decrease risk of post-interventional                            bleeding.                           - Patient will need a KUB 2-view in 10-14 days to                            ensure pancreatic stent has migrated successfully.                            If still present at that time will need to be                            scheduled for EGD with stent pull.                            - The findings and recommendations were discussed                            with the patient.                           -  The findings and recommendations were discussed                            with the referring physician. Procedure Code(s):        --- Professional ---                           [phone], Endoscopic retrograde                            cholangiopancreatography (ERCP); with placement of                            endoscopic stent into biliary or pancreatic duct,                            including pre- and post-dilation and guide wire                            passage, when performed, including sphincterotomy,                            when performed, each stent                           43274, 59, Endoscopic retrograde                            cholangiopancreatography (ERCP); with placement of                            endoscopic stent into biliary or pancreatic duct,                            including pre- and post-dilation and guide wire                            passage, when performed, including sphincterotomy,                            when performed, each stent                           43264, Endoscopic retrograde                            cholangiopancreatography (ERCP); with removal of                            calculi/debris from biliary/pancreatic duct(s)                           25671, Endoscopic catheterization of the biliary                            ductal system, radiological supervision and  interpretation Diagnosis Code(s):        --- Professional ---                           K31.89, Other diseases of stomach and duodenum                           K83.8, Other specified diseases of biliary tract                           K83.9, Disease of biliary tract, unspecified                           R94.5, Abnormal results of liver function studies CPT copyright 2022 American Medical Association. All rights  reserved. The codes documented in this report are preliminary and upon coder review may  be revised to meet current compliance requirements. Aloha Finner, MD 07/23/2023 5:16:44 PM Number of Addenda: 0

## 2023-07-23 NOTE — Anesthesia Postprocedure Evaluation (Signed)
 Anesthesia Post Note  Patient: Tricia Dodson  Procedure(s) Performed: ENDOSCOPIC RETROGRADE CHOLANGIOPANCREATOGRAPHY (ERCP) WITH PROPOFOL  SPHINCTEROTOMY BILIARY STENT PLACEMENT PANCREATIC STENT PLACEMENT REMOVAL OF SLUDGE     Patient location during evaluation: PACU Anesthesia Type: General Level of consciousness: awake and alert Pain management: pain level controlled Vital Signs Assessment: post-procedure vital signs reviewed and stable Respiratory status: spontaneous breathing, nonlabored ventilation, respiratory function stable and patient connected to nasal cannula oxygen Cardiovascular status: blood pressure returned to baseline and stable Postop Assessment: no apparent nausea or vomiting Anesthetic complications: no  No notable events documented.  Last Vitals:  Vitals:   07/23/23 1715 07/23/23 1730  BP: 134/73 132/69  Pulse: 88 80  Resp: 20 16  Temp:  37.1 C  SpO2: 93% 94%    Last Pain:  Vitals:   07/23/23 1730  TempSrc:   PainSc: 0-No pain                 Darreon Lutes,W. EDMOND

## 2023-07-23 NOTE — Transfer of Care (Signed)
 Immediate Anesthesia Transfer of Care Note  Patient: Tricia Dodson  Procedure(s) Performed: ENDOSCOPIC RETROGRADE CHOLANGIOPANCREATOGRAPHY (ERCP) WITH PROPOFOL  SPHINCTEROTOMY BILIARY STENT PLACEMENT PANCREATIC STENT PLACEMENT REMOVAL OF SLUDGE  Patient Location: PACU  Anesthesia Type:General  Level of Consciousness: drowsy  Airway & Oxygen Therapy: Patient Spontanous Breathing and Patient connected to face mask oxygen  Post-op Assessment: Report given to RN and Post -op Vital signs reviewed and stable  Post vital signs: Reviewed and stable  Last Vitals:  Vitals Value Taken Time  BP 134/73 07/23/23 1715  Temp 37.1 C 07/23/23 1708  Pulse 82 07/23/23 1728  Resp 17 07/23/23 1728  SpO2 95 % 07/23/23 1728  Vitals shown include unfiled device data.  Last Pain:  Vitals:   07/23/23 1708  TempSrc:   PainSc: Asleep      Patients Stated Pain Goal: 0 (07/22/23 2000)  Complications: No notable events documented.

## 2023-07-23 NOTE — Progress Notes (Signed)
 Mobility Specialist Progress Note:   07/23/23 1207  Mobility  Activity Ambulated with assistance in hallway  Level of Assistance  (MinG)  Assistive Device  (IV Pole)  Distance Ambulated (ft) 175 ft  Activity Response Tolerated well  Mobility Referral Yes  Mobility visit 1 Mobility  Mobility Specialist Start Time (ACUTE ONLY) 1150  Mobility Specialist Stop Time (ACUTE ONLY) 1205  Mobility Specialist Time Calculation (min) (ACUTE ONLY) 15 min   Pt received in bed, agreeable to mobility. Requesting to use BR prior to ambulation. Void successful. Pt was able to ambulate in hallway with IV pole for assist. C/o back pain and soreness from incision site, otherwise asx throughout. Pt left in chair with call bell in reach and all needs met.  Tricia Dodson  Mobility Specialist Please contact via [employer] office at [phone]

## 2023-07-23 NOTE — Progress Notes (Signed)
 PROGRESS NOTE    Tricia Dodson  FMW:[id number] DOB: Oct 02, 1950 DOA: 07/16/2023 PCP: Jason Leita Repine, FNP    Brief Narrative:  Tricia Dodson is a 73 y.o. female with medical history significant of obstructive sleep apnea, CPAP use, obesity.  Patient reports being in her usual state of health till approximately 4 days ago when she reports an abrupt onset of right upper quadrant abdominal pain with slight radiation posteriorly laterally.  This was associated with marked diminishment of appetite.  No report of fever or vomiting or diarrhea.  Patient actually has been constipated.  Patient actually presented to [facility] ER yesterday with above complaints.  White blood cell of 17.2 lipase within normal limits.  AST ALT slightly elevated at 59 and 71 respectively.  Lactic acid of 1.4.  Ultrasound abdomen showed sonographic Murphy sign compatible with acute cholecystitis. S/p complicated cholecystectomy and is being worked up for a ? Bile leak-- plan for ERCP 1/14   Assessment and Plan:  * Acute cholecystitis -s/p subtotal GB removal by GS -drain in place - WBC Normalized -suspected Bile leak-- GI consulted for ERCP--plan for 1/14   AKI (acute kidney injury) (HCC) -resolved  Blood culture with strep anginosis -on abx -suspect related to GB -IV abx -echo shows no endocarditis -repeat blood cultures- NGTD    Hypertension -BP controlled   OSA - (continuous positive airway pressure) dependence -CPAP   Hypokalemia -replete   Patient has chronic shingle pain - cw. Gabapentin   Hypothyroid - c.w. synthroid .  Obesity Estimated body mass index is 39.47 kg/m as calculated from the following:   Height as of this encounter: 5' 7 (1.702 m).   Weight as of this encounter: 114.3 kg.   DVT prophylaxis: SCDs Start: 07/17/23 1913    Code Status: Full Code   Disposition Plan:  Level of care: Med-Surg Status is: inpt    Consultants:   GS GI   Subjective: thirsty  Objective: Vitals:   07/22/23 1559 07/22/23 2015 07/23/23 0605 07/23/23 0831  BP: (!) 144/75 (!) 148/82 (!) 142/72 135/83  Pulse: 89 87 82 81  Resp: 16 18 17 20   Temp: 98.2 F (36.8 C) 97.9 F (36.6 C) (!) 97.5 F (36.4 C)   TempSrc: Oral Oral    SpO2: 95% 95% 94% 95%  Weight:      Height:        Intake/Output Summary (Last 24 hours) at 07/23/2023 1309 Last data filed at 07/22/2023 2013 Gross per 24 hour  Intake --  Output 90 ml  Net -90 ml   Filed Weights   07/16/23 1318 07/17/23 1418  Weight: 115 kg 114.3 kg    Examination:   General: Appearance:    Obese female in no acute distress     Lungs:     respirations unlabored  Heart:    Normal heart rate.  MS:   All extremities are intact.   Neurologic:   Awake, alert      Data Reviewed: I have personally reviewed following labs and imaging studies  CBC: Recent Labs  Lab 07/16/23 1522 07/17/23 1952 07/18/23 0548 07/19/23 0632 07/21/23 0602 07/23/23 0602  WBC 17.2* 14.6* 13.6* 15.9* 9.2 10.5  NEUTROABS 13.5*  --   --   --   --   --   HGB 12.9 12.5 11.7* 11.8* 12.3 12.7  HCT 40.3 40.3 36.4 37.1 39.8 39.8  MCV 85.0 86.5 84.7 85.3 87.1 86.0  PLT 409* 414* 407* 441* 394 361  Basic Metabolic Panel: Recent Labs  Lab 07/16/23 1522 07/17/23 1952 07/18/23 0548 07/19/23 0632 07/21/23 0602 07/23/23 0602  NA 137  --  137 139 139 142  K 4.2  --  3.5 3.9 3.4* 3.9  CL 104  --  102 100 104 105  CO2 23  --  23 24 28 27   GLUCOSE 121*  --  131* 179* 166* 133*  BUN 13  --  20 17 10  <5*  CREATININE 1.16* 1.25* 1.22* 1.12* 0.90 0.84  CALCIUM  8.9  --  8.4* 8.4* 8.1* 8.4*   GFR: Estimated Creatinine Clearance: 79 mL/min (by C-G formula based on SCr of 0.84 mg/dL). Liver Function Tests: Recent Labs  Lab 07/16/23 1522 07/18/23 0548 07/19/23 0854 07/21/23 0602 07/23/23 0602  AST 59* 28 43* 17 24  ALT 71* 43 52* 26 23  ALKPHOS 112 107 109 92 89  BILITOT 0.8 1.0 0.6 0.4 0.5   PROT 7.8 6.2* 6.6 5.8* 5.8*  ALBUMIN 3.3* 2.2* 2.3* 2.0* 2.3*   Recent Labs  Lab 07/16/23 1522  LIPASE 22   No results for input(s): AMMONIA in the last 168 hours. Coagulation Profile: Recent Labs  Lab 07/18/23 0548  INR 1.2   Cardiac Enzymes: No results for input(s): CKTOTAL, CKMB, CKMBINDEX, TROPONINI in the last 168 hours. BNP (last 3 results) No results for input(s): PROBNP in the last 8760 hours. HbA1C: No results for input(s): HGBA1C in the last 72 hours. CBG: Recent Labs  Lab 07/22/23 1319 07/22/23 1715 07/22/23 2019 07/23/23 0838 07/23/23 1225  GLUCAP 114* 142* 160* 115* 121*   Lipid Profile: No results for input(s): CHOL, HDL, LDLCALC, TRIG, CHOLHDL, LDLDIRECT in the last 72 hours. Thyroid  Function Tests: No results for input(s): TSH, T4TOTAL, FREET4, T3FREE, THYROIDAB in the last 72 hours. Anemia Panel: No results for input(s): VITAMINB12, FOLATE, FERRITIN, TIBC, IRON, RETICCTPCT in the last 72 hours. Sepsis Labs: Recent Labs  Lab 07/16/23 2015  LATICACIDVEN 1.4    Recent Results (from the past 240 hours)  Blood culture (routine x 2)     Status: Abnormal   Collection Time: 07/16/23  8:14 PM   Specimen: Right Antecubital; Blood  Result Value Ref Range Status   Specimen Description   Final    RIGHT ANTECUBITAL BLOOD Performed at [facility], [address] [place], [place] [address]    Special Requests   Final    BOTTLES DRAWN AEROBIC AND ANAEROBIC Blood Culture adequate volume Performed at [facility], [address] [place], [place] [address]    Culture  Setup Time   Final    GRAM POSITIVE COCCI AEROBIC BOTTLE ONLY CRITICAL RESULT CALLED TO, READ BACK BY AND VERIFIED WITH: PHARMD J LEDFORD 07/18/2023 @ 0151 BY AB Performed at [facility], [address] [place], [place] [address]    Culture STREPTOCOCCUS ANGINOSIS (A)  Final   Report Status 07/20/2023 FINAL  Final    Organism ID, Bacteria STREPTOCOCCUS ANGINOSIS  Final      Susceptibility   Streptococcus anginosis - MIC*    PENICILLIN <=0.06 SENSITIVE Sensitive     CEFTRIAXONE <=0.12 SENSITIVE Sensitive     ERYTHROMYCIN <=0.12 SENSITIVE Sensitive     LEVOFLOXACIN  0.5 SENSITIVE Sensitive     VANCOMYCIN 0.5 SENSITIVE Sensitive     * STREPTOCOCCUS ANGINOSIS  Blood culture (routine x 2)     Status: Abnormal   Collection Time: 07/16/23  8:14 PM   Specimen: Left Antecubital; Blood  Result Value Ref Range Status   Specimen Description   Final    LEFT ANTECUBITAL Performed at [facility], [address] [place], [place] [address]    Special Requests   Final    BOTTLES DRAWN AEROBIC AND ANAEROBIC Blood Culture adequate volume Performed at [facility], [address] [place], [place] [address]    Culture  Setup Time   Final    GRAM POSITIVE COCCI AEROBIC BOTTLE ONLY CRITICAL RESULT CALLED TO, READ BACK BY AND VERIFIED WITH: PHARMD ANDREW MEYER ON 07/18/23 @ 2021 BY DRT    Culture (A)  Final    STAPHYLOCOCCUS CAPITIS THE SIGNIFICANCE OF ISOLATING THIS ORGANISM FROM A SINGLE SET OF BLOOD CULTURES WHEN MULTIPLE SETS ARE DRAWN IS UNCERTAIN. PLEASE NOTIFY THE MICROBIOLOGY DEPARTMENT WITHIN ONE WEEK IF SPECIATION AND SENSITIVITIES ARE REQUIRED. Performed at [facility], [address], [place], [place] [address]    Report Status 07/22/2023 FINAL  Final  Blood Culture ID Panel (Reflexed)     Status: Abnormal   Collection Time: 07/16/23  8:14 PM  Result Value Ref Range Status   Enterococcus faecalis NOT DETECTED NOT DETECTED Final   Enterococcus Faecium NOT DETECTED NOT DETECTED Final   Listeria monocytogenes NOT DETECTED NOT DETECTED Final   Staphylococcus species NOT DETECTED NOT DETECTED Final   Staphylococcus aureus (BCID) NOT DETECTED NOT DETECTED Final   Staphylococcus epidermidis NOT DETECTED NOT DETECTED Final   Staphylococcus lugdunensis NOT DETECTED NOT DETECTED  Final   Streptococcus species DETECTED (A) NOT DETECTED Final    Comment: Not Enterococcus species, Streptococcus agalactiae, Streptococcus pyogenes, or Streptococcus pneumoniae. CRITICAL RESULT CALLED TO, READ BACK BY AND VERIFIED WITH: PHARMD J LEDFORD 07/18/2023 @ 0151 BY AB    Streptococcus agalactiae NOT DETECTED NOT DETECTED Final   Streptococcus pneumoniae NOT DETECTED NOT DETECTED Final   Streptococcus pyogenes NOT DETECTED NOT DETECTED Final   A.calcoaceticus-baumannii NOT DETECTED NOT DETECTED Final   Bacteroides fragilis NOT DETECTED NOT DETECTED Final   Enterobacterales NOT DETECTED NOT DETECTED Final   Enterobacter cloacae complex NOT DETECTED NOT DETECTED Final   Escherichia coli NOT DETECTED NOT DETECTED Final   Klebsiella aerogenes NOT DETECTED NOT DETECTED Final   Klebsiella oxytoca NOT DETECTED NOT DETECTED Final   Klebsiella pneumoniae NOT DETECTED NOT DETECTED Final   Proteus species NOT DETECTED NOT DETECTED Final   Salmonella species NOT DETECTED NOT DETECTED Final   Serratia marcescens NOT DETECTED NOT DETECTED Final   Haemophilus influenzae NOT DETECTED NOT DETECTED Final   Neisseria meningitidis NOT DETECTED NOT DETECTED Final   Pseudomonas aeruginosa NOT DETECTED NOT DETECTED Final   Stenotrophomonas maltophilia NOT DETECTED NOT DETECTED Final   Candida albicans NOT DETECTED NOT DETECTED Final   Candida auris NOT DETECTED NOT DETECTED Final   Candida glabrata NOT DETECTED NOT DETECTED Final   Candida krusei NOT DETECTED NOT DETECTED Final   Candida parapsilosis NOT DETECTED NOT DETECTED Final   Candida tropicalis NOT DETECTED NOT DETECTED Final   Cryptococcus neoformans/gattii NOT DETECTED NOT DETECTED Final    Comment: Performed at [facility], [address] [place], [place] [address]  Surgical pcr screen     Status: Abnormal   Collection Time: 07/18/23  3:44 AM   Specimen: Nasal Mucosa; Nasal Swab  Result Value Ref Range Status   MRSA, PCR  NEGATIVE NEGATIVE Final   Staphylococcus aureus POSITIVE (A) NEGATIVE Final    Comment: (NOTE) The Xpert SA  Assay (FDA approved for NASAL specimens in patients 1 years of age and older), is one component of a comprehensive surveillance program. It is not intended to diagnose infection nor to guide or monitor treatment. Performed at [facility], [address], [place], [place] [address]   Blood Culture ID Panel (Reflexed)     Status: Abnormal   Collection Time: 07/18/23  8:14 PM  Result Value Ref Range Status   Enterococcus faecalis NOT DETECTED NOT DETECTED Final   Enterococcus Faecium NOT DETECTED NOT DETECTED Final   Listeria monocytogenes NOT DETECTED NOT DETECTED Final   Staphylococcus species DETECTED (A) NOT DETECTED Final    Comment: CRITICAL RESULT CALLED TO, READ BACK BY AND VERIFIED WITH: PHARMD ANDREW MEYER ON 07/18/23 @ 2038 BY DRT    Staphylococcus aureus (BCID) NOT DETECTED NOT DETECTED Final   Staphylococcus epidermidis NOT DETECTED NOT DETECTED Final   Staphylococcus lugdunensis NOT DETECTED NOT DETECTED Final   Streptococcus species NOT DETECTED NOT DETECTED Final   Streptococcus agalactiae NOT DETECTED NOT DETECTED Corrected    Comment: CORRECTED ON 01/10 AT 0815: PREVIOUSLY REPORTED AS NOT DETECTED CRITICAL RESULT CALLED TO, READ BACK BY AND VERIFIED WITH: PHARMD ANDREW MEYER ON 07/18/23 @ 2038 BY DRT   Streptococcus pneumoniae NOT DETECTED NOT DETECTED Final   Streptococcus pyogenes NOT DETECTED NOT DETECTED Final   A.calcoaceticus-baumannii NOT DETECTED NOT DETECTED Final   Bacteroides fragilis NOT DETECTED NOT DETECTED Final   Enterobacterales NOT DETECTED NOT DETECTED Final   Enterobacter cloacae complex NOT DETECTED NOT DETECTED Final   Escherichia coli NOT DETECTED NOT DETECTED Final   Klebsiella aerogenes NOT DETECTED NOT DETECTED Final   Klebsiella oxytoca NOT DETECTED NOT DETECTED Final   Klebsiella pneumoniae NOT DETECTED NOT DETECTED Final    Proteus species NOT DETECTED NOT DETECTED Final   Salmonella species NOT DETECTED NOT DETECTED Final   Serratia marcescens NOT DETECTED NOT DETECTED Final   Haemophilus influenzae NOT DETECTED NOT DETECTED Final   Neisseria meningitidis NOT DETECTED NOT DETECTED Final   Pseudomonas aeruginosa NOT DETECTED NOT DETECTED Final   Stenotrophomonas maltophilia NOT DETECTED NOT DETECTED Final   Candida albicans NOT DETECTED NOT DETECTED Final   Candida auris NOT DETECTED NOT DETECTED Final   Candida glabrata NOT DETECTED NOT DETECTED Final   Candida krusei NOT DETECTED NOT DETECTED Final   Candida parapsilosis NOT DETECTED NOT DETECTED Final   Candida tropicalis NOT DETECTED NOT DETECTED Final   Cryptococcus neoformans/gattii NOT DETECTED NOT DETECTED Final    Comment: Performed at [facility], [address], [place], [place] [address]  Culture, blood (Routine X 2) w Reflex to ID Panel     Status: None (Preliminary result)   Collection Time: 07/20/23  4:56 AM   Specimen: BLOOD  Result Value Ref Range Status   Specimen Description BLOOD BLOOD RIGHT HAND  Final   Special Requests   Final    BOTTLES DRAWN AEROBIC AND ANAEROBIC Blood Culture results may not be optimal due to an inadequate volume of blood received in culture bottles   Culture   Final    NO GROWTH 3 DAYS Performed at [facility], [address], [place], [place] [address]    Report Status PENDING  Incomplete  Culture, blood (Routine X 2) w Reflex to ID Panel     Status: None (Preliminary result)   Collection Time: 07/20/23  4:57 AM   Specimen: BLOOD  Result Value Ref Range Status  Specimen Description BLOOD BLOOD RIGHT HAND  Final   Special Requests   Final    BOTTLES DRAWN AEROBIC AND ANAEROBIC Blood Culture results may not be optimal due to an inadequate volume of blood received in culture bottles   Culture   Final    NO GROWTH 3 DAYS Performed at [facility], [address] [place], [place]  [address]    Report Status PENDING  Incomplete         Radiology Studies: NM HEPATOBILIARY LEAK (POST-SURGICAL) Result Date: 07/22/2023 CLINICAL DATA:  Prior cholecystectomy 07/18/2023.  Possible leak. EXAM: NUCLEAR MEDICINE HEPATOBILIARY IMAGING TECHNIQUE: Sequential images of the abdomen were obtained out to 60 minutes following intravenous administration of radiopharmaceutical. The drainage tube was clamped by the technologist prior to acquiring images. RADIOPHARMACEUTICALS:  5.4 mCi Tc-28m  Choletec  IV COMPARISON:  Pre-surgical MRI 07/18/2023.  CT 07/16/2023. FINDINGS: Prompt uptake and biliary excretion of activity by the liver is seen. Biliary activity passes into small bowel, consistent with patent common bile duct. Beginning at approximately 50 minutes, there is accumulation of radiotracer lateral to the common duct in the location of the previous gallbladder. This has worrisome for a leak. IMPRESSION: Accumulation radiotracer lateral to the common duct in the presumed location of the gallbladder fossa worrisome for leak. If needed additional cross-sectional imaging such as CT or MRI could be considered as clinically appropriate to further delineate. Electronically Signed   By: Ranell Bring M.D.   On: 07/22/2023 12:52         Scheduled Meds:  amitriptyline   10 mg Oral QHS   atorvastatin   10 mg Oral Daily   DULoxetine   20 mg Oral Daily   gabapentin   300 mg Oral BID   insulin  aspart  0-15 Units Subcutaneous TID WC   insulin  aspart  0-5 Units Subcutaneous QHS   levothyroxine   25 mcg Oral Q0600   pantoprazole   40 mg Oral Daily   senna-docusate  2 tablet Oral BID   sodium chloride  flush  3 mL Intravenous Q12H   Continuous Infusions:  ampicillin -sulbactam (UNASYN ) IV 3 g (07/23/23 1300)     LOS: 5 days    Time spent: 45 minutes spent on chart review, discussion with nursing staff, consultants, updating family and interview/physical exam; more than 50% of that time was spent in  counseling and/or coordination of care.    Harlene RAYMOND Bowl, DO [facility] Available via Epic secure chat 7am-7pm After these hours, please refer to coverage provider listed on [email or web address] 07/23/2023, 1:09 PM

## 2023-07-23 NOTE — Progress Notes (Signed)
 [facility] Progress Note  5 Days Post-Op  Subjective: CC:  Abdominal pain improved, located around drain. Tolerating FLD. NPO today for planned ERCP. Denies nausea or vomiting. Having bowel function  Objective: Vital signs in last 24 hours: Temp:  [97.5 F (36.4 C)-98.2 F (36.8 C)] 97.5 F (36.4 C) (01/14 0605) Pulse Rate:  [81-89] 81 (01/14 0831) Resp:  [16-20] 20 (01/14 0831) BP: (135-148)/(72-83) 135/83 (01/14 0831) SpO2:  [94 %-95 %] 95 % (01/14 0831) Last BM Date : 07/21/23  Intake/Output from previous day: 01/13 0701 - 01/14 0700 In: -  Out: 90 [Drains:90] Intake/Output this shift: No intake/output data recorded.  PE: Gen:  Alert, NAD, pleasant Card:  Regular rate and rhythm Pulm:  non-labored Abd: Soft, TTP lower abdomen and right abdomen without guarding or peritonitis, JP appears thin golden bilious type effluent. Well healed incisions without erythema  Skin: warm and dry, no rashes  Psych: A&Ox3   Lab Results:  Recent Labs    07/21/23 0602 07/23/23 0602  WBC 9.2 10.5  HGB 12.3 12.7  HCT 39.8 39.8  PLT 394 361   BMET Recent Labs    07/21/23 0602 07/23/23 0602  NA 139 142  K 3.4* 3.9  CL 104 105  CO2 28 27  GLUCOSE 166* 133*  BUN 10 <5*  CREATININE 0.90 0.84  CALCIUM  8.1* 8.4*   PT/INR No results for input(s): LABPROT, INR in the last 72 hours.  CMP     Component Value Date/Time   NA 142 07/23/2023 0602   K 3.9 07/23/2023 0602   CL 105 07/23/2023 0602   CO2 27 07/23/2023 0602   GLUCOSE 133 (H) 07/23/2023 0602   BUN <5 (L) 07/23/2023 0602   CREATININE 0.84 07/23/2023 0602   CALCIUM  8.4 (L) 07/23/2023 0602   PROT 5.8 (L) 07/23/2023 0602   ALBUMIN 2.3 (L) 07/23/2023 0602   AST 24 07/23/2023 0602   ALT 23 07/23/2023 0602   ALKPHOS 89 07/23/2023 0602   BILITOT 0.5 07/23/2023 0602   GFRNONAA >60 07/23/2023 0602   Lipase     Component Value Date/Time   LIPASE 22 07/16/2023 1522       Studies/Results: NM  HEPATOBILIARY LEAK (POST-SURGICAL) Result Date: 07/22/2023 CLINICAL DATA:  Prior cholecystectomy 07/18/2023.  Possible leak. EXAM: NUCLEAR MEDICINE HEPATOBILIARY IMAGING TECHNIQUE: Sequential images of the abdomen were obtained out to 60 minutes following intravenous administration of radiopharmaceutical. The drainage tube was clamped by the technologist prior to acquiring images. RADIOPHARMACEUTICALS:  5.4 mCi Tc-52m  Choletec  IV COMPARISON:  Pre-surgical MRI 07/18/2023.  CT 07/16/2023. FINDINGS: Prompt uptake and biliary excretion of activity by the liver is seen. Biliary activity passes into small bowel, consistent with patent common bile duct. Beginning at approximately 50 minutes, there is accumulation of radiotracer lateral to the common duct in the location of the previous gallbladder. This has worrisome for a leak. IMPRESSION: Accumulation radiotracer lateral to the common duct in the presumed location of the gallbladder fossa worrisome for leak. If needed additional cross-sectional imaging such as CT or MRI could be considered as clinically appropriate to further delineate. Electronically Signed   By: Ranell Bring M.D.   On: 07/22/2023 12:52     Anti-infectives: Anti-infectives (From admission, onward)    Start     Dose/Rate Route Frequency Ordered Stop   07/19/23 1800  Ampicillin -Sulbactam (UNASYN ) 3 g in sodium chloride  0.9 % 100 mL IVPB        3 g 200 mL/hr over 30  Minutes Intravenous Every 6 hours 07/19/23 1335     07/18/23 0830  ceFAZolin  (ANCEF ) 3 g in sodium chloride  0.9 % 100 mL IVPB  Status:  Discontinued        3 g 200 mL/hr over 30 Minutes Intravenous On call to O.R. 07/18/23 0818 07/18/23 1134   07/18/23 0814  sodium chloride  0.9 % with ceFAZolin  (ANCEF ) ADS Med       Note to Pharmacy: Marvin Domino E: cabinet override      07/18/23 0814 07/18/23 2029   07/17/23 1430  ceFAZolin  (ANCEF ) 3 g in sodium chloride  0.9 % 100 mL IVPB  Status:  Discontinued        3 g 200 mL/hr over 30  Minutes Intravenous On call to O.R. 07/17/23 1352 07/17/23 1822   07/17/23 1403  sodium chloride  0.9 % with ceFAZolin  (ANCEF ) ADS Med  Status:  Discontinued       Note to Pharmacy: Jonda Yancy HERO: cabinet override      07/17/23 1403 07/17/23 1741   07/17/23 0800  piperacillin -tazobactam (ZOSYN ) IVPB 3.375 g  Status:  Discontinued        3.375 g 12.5 mL/hr over 240 Minutes Intravenous Every 8 hours 07/17/23 0759 07/19/23 1335   07/16/23 1945  piperacillin -tazobactam (ZOSYN ) IVPB 3.375 g        3.375 g 100 mL/hr over 30 Minutes Intravenous  Once 07/16/23 1934 07/16/23 2228        Assessment/Plan  Acute gangrenous cholecystitis POD#5 s/p subtotal cholecystectomy 1/9 Dr. Polly   AFVSS,WBC normal, creatinine has normalized, LFTs are pending Continue to monitor. GI following  LFTs normal NPO for planned ERCP today; soft diet following Unasyn  Suspect bile leak based on color - appears like golden clear bile to me. Noted plans for ERCP today Lovenox  daily   LOS: 5 days   Lonni Pizza, MD [facility]

## 2023-07-23 NOTE — Plan of Care (Signed)

## 2023-07-23 NOTE — Anesthesia Procedure Notes (Signed)
 Procedure Name: Intubation Date/Time: 07/23/2023 3:41 PM  Performed by: Boyce Shilling, CRNAPre-anesthesia Checklist: Patient identified, Emergency Drugs available, Suction available, Timeout performed and Patient being monitored Patient Re-evaluated:Patient Re-evaluated prior to induction Oxygen Delivery Method: Circle system utilized Preoxygenation: Pre-oxygenation with 100% oxygen Induction Type: IV induction Ventilation: Mask ventilation without difficulty Laryngoscope Size: Mac and 3 Grade View: Grade I Tube type: Oral Tube size: 7.5 mm Number of attempts: 1 Airway Equipment and Method: Stylet Placement Confirmation: ETT inserted through vocal cords under direct vision, positive ETCO2, CO2 detector and breath sounds checked- equal and bilateral Secured at: 23 cm Tube secured with: Tape Dental Injury: Teeth and Oropharynx as per pre-operative assessment

## 2023-07-23 NOTE — Interval H&P Note (Signed)
 History and Physical Interval Note:  07/23/2023 3:27 PM  Tricia Dodson  has presented today for surgery, with the diagnosis of bile duct leak.  The various methods of treatment have been discussed with the patient and family. After consideration of risks, benefits and other options for treatment, the patient has consented to  Procedure(s): ENDOSCOPIC RETROGRADE CHOLANGIOPANCREATOGRAPHY (ERCP) WITH PROPOFOL  (N/A) as a surgical intervention.  The patient's history has been reviewed, patient examined, no change in status, stable for surgery.  I have reviewed the patient's chart and labs.  Questions were answered to the patient's satisfaction.     [name]

## 2023-07-23 NOTE — Progress Notes (Signed)
 OT Cancellation Note  Patient Details Name: Tricia Dodson MRN: [id number] DOB: Dec 08, 1950   Cancelled Treatment:    Reason Eval/Treat Not Completed: Patient not medically ready (plan for ERCP 1/14- off unit at this time)  Ely Molt 07/23/2023, 2:45 PM

## 2023-07-24 ENCOUNTER — Telehealth: Payer: Self-pay

## 2023-07-24 DIAGNOSIS — K838 Other specified diseases of biliary tract: Secondary | ICD-10-CM | POA: Diagnosis not present

## 2023-07-24 DIAGNOSIS — K810 Acute cholecystitis: Secondary | ICD-10-CM | POA: Diagnosis not present

## 2023-07-24 DIAGNOSIS — K9189 Other postprocedural complications and disorders of digestive system: Secondary | ICD-10-CM | POA: Diagnosis not present

## 2023-07-24 DIAGNOSIS — T85528A Displacement of other gastrointestinal prosthetic devices, implants and grafts, initial encounter: Secondary | ICD-10-CM

## 2023-07-24 LAB — COMPREHENSIVE METABOLIC PANEL
ALT: 21 U/L (ref 0–44)
AST: 18 U/L (ref 15–41)
Albumin: 2.2 g/dL — ABNORMAL LOW (ref 3.5–5.0)
Alkaline Phosphatase: 96 U/L (ref 38–126)
Anion gap: 12 (ref 5–15)
BUN: 7 mg/dL — ABNORMAL LOW (ref 8–23)
CO2: 27 mmol/L (ref 22–32)
Calcium: 8.1 mg/dL — ABNORMAL LOW (ref 8.9–10.3)
Chloride: 107 mmol/L (ref 98–111)
Creatinine, Ser: 1.39 mg/dL — ABNORMAL HIGH (ref 0.44–1.00)
GFR, Estimated: 40 mL/min — ABNORMAL LOW (ref >60)
Glucose, Bld: 127 mg/dL — ABNORMAL HIGH (ref 70–99)
Potassium: 4.3 mmol/L (ref 3.5–5.1)
Sodium: 146 mmol/L — ABNORMAL HIGH (ref 135–145)
Total Bilirubin: 0.5 mg/dL (ref 0.0–1.2)
Total Protein: 5.8 g/dL — ABNORMAL LOW (ref 6.5–8.1)

## 2023-07-24 LAB — SODIUM: Sodium: 139 mmol/L (ref 135–145)

## 2023-07-24 LAB — GLUCOSE, CAPILLARY
Glucose-Capillary: 109 mg/dL — ABNORMAL HIGH (ref 70–99)
Glucose-Capillary: 183 mg/dL — ABNORMAL HIGH (ref 70–99)
Glucose-Capillary: 179 mg/dL — ABNORMAL HIGH (ref 70–99)
Glucose-Capillary: 212 mg/dL — ABNORMAL HIGH (ref 70–99)

## 2023-07-24 MED ORDER — ADULT MULTIVITAMIN W/MINERALS CH
1.0000 | ORAL_TABLET | Freq: Every day | ORAL | Status: DC
  Administered 2023-07-24 – 2023-07-27 (×4): 1 via ORAL
  Filled 2023-07-24 (×5): qty 1

## 2023-07-24 MED ORDER — ENSURE ENLIVE PO LIQD
237.0000 mL | Freq: Three times a day (TID) | ORAL | Status: DC
  Administered 2023-07-24 – 2023-07-27 (×5): 237 mL via ORAL

## 2023-07-24 NOTE — Telephone Encounter (Signed)
 KUB has been entered

## 2023-07-24 NOTE — TOC Progression Note (Signed)
 Transition of Care (TOC) - Progression Note   Called Mitch with [employer] for walker  Patient Details  Name: Tricia Dodson MRN: [id number] Date of Birth: 06-14-1951  Transition of Care [facility]) CM/SW Contact  Isaac Dubie, Arturo Late, RN Phone Number: 07/24/2023, 2:13 PM  Clinical Narrative:       Expected Discharge Plan: Home/Self Care Barriers to Discharge: Continued Medical Work up  Expected Discharge Plan and Services   Discharge Planning Services: CM Consult Post Acute Care Choice: NA Living arrangements for the past 2 months: Single Family Home                 DME Arranged: N/A DME Agency: NA       HH Arranged: NA HH Agency: NA         Social Determinants of Health (SDOH) Interventions SDOH Screenings   Food Insecurity: Unknown (07/17/2023)  Housing: Low Risk  (07/17/2023)  Transportation Needs: No Transportation Needs (07/17/2023)  Utilities: Not At Risk (07/17/2023)  Alcohol Screen: Low Risk  (07/10/2023)  Depression (PHQ2-9): Low Risk  (06/18/2023)  Financial Resource Strain: Patient Declined (07/10/2023)  Physical Activity: Insufficiently Active (07/10/2023)  Social Connections: Socially Isolated (07/17/2023)  Stress: No Stress Concern Present (07/10/2023)  Tobacco Use: Medium Risk (07/23/2023)    Readmission Risk Interventions     No data to display

## 2023-07-24 NOTE — Progress Notes (Signed)
 Gastroenterology Inpatient Follow Up    Subjective: Patient states that her abdominal pain is slightly better compared to yesterday.  She does think that the JP drain output has decreased.  No blood in the stools.  Objective: Vital signs in last 24 hours: Temp:  [97.5 F (36.4 C)-98.8 F (37.1 C)] 98.8 F (37.1 C) (01/15 1555) Pulse Rate:  [52-81] 74 (01/15 1555) Resp:  [16-18] 16 (01/15 1555) BP: (135-155)/(66-84) 135/66 (01/15 1555) SpO2:  [95 %-100 %] 99 % (01/15 1555) Weight:  [114.7 kg] 114.7 kg (01/15 1214) Last BM Date : 07/21/23  Intake/Output from previous day: 01/14 0701 - 01/15 0700 In: 500 [I.V.:500] Out: 90 [Drains:90] Intake/Output this shift: Total I/O In: -  Out: 80 [Drains:80]  General appearance: alert and cooperative Resp: no increased WOB Cardio: regular rate GI: Patient's JP drain has a small amount of serosanguineous content.  Mildly tender to palpation  Lab Results: Recent Labs    07/23/23 0602  WBC 10.5  HGB 12.7  HCT 39.8  PLT 361   BMET Recent Labs    07/23/23 0602 07/24/23 0846 07/24/23 1700  NA 142 146* 139  K 3.9 4.3  --   CL 105 107  --   CO2 27 27  --   GLUCOSE 133* 127*  --   BUN <5* 7*  --   CREATININE 0.84 1.39*  --   CALCIUM  8.4* 8.1*  --    LFT Recent Labs    07/24/23 0846  PROT 5.8*  ALBUMIN 2.2*  AST 18  ALT 21  ALKPHOS 96  BILITOT 0.5   PT/INR No results for input(s): "LABPROT", "INR" in the last 72 hours. Hepatitis Panel No results for input(s): "HEPBSAG", "HCVAB", "HEPAIGM", "HEPBIGM" in the last 72 hours. C-Diff No results for input(s): "CDIFFTOX" in the last 72 hours.  Studies/Results: DG ERCP Result Date: 07/24/2023 CLINICAL DATA:  Possible choledocholithiasis EXAM: ERCP TECHNIQUE: Multiple spot images obtained with the fluoroscopic device and submitted for interpretation post-procedure. FLUOROSCOPY: Radiation Exposure Index (as provided by the fluoroscopic device): 69.8 mGy Kerma  COMPARISON:  None Available. FINDINGS: Seven intraoperative saved images are submitted for review. The images demonstrate a flexible duodenal scope in the descending duodenum followed by wire cannulation of the pancreatic and common bile ducts. Subsequent images demonstrate placement of a plastic biliary stent. Cholecystectomy clips are present suggesting prior cholecystectomy. IMPRESSION: ERCP with placement of a plastic biliary stent. These images were submitted for radiologic interpretation only. Please see the procedural report for the amount of contrast and the fluoroscopy time utilized. Electronically Signed   By: Fernando Hoyer M.D.   On: 07/24/2023 08:00   DG C-Arm 1-60 Min-No Report Result Date: 07/23/2023 Fluoroscopy was utilized by the requesting physician.  No radiographic interpretation.    Medications: I have reviewed the patient's current medications. Scheduled:  amitriptyline   10 mg Oral QHS   atorvastatin   10 mg Oral Daily   DULoxetine   20 mg Oral Daily   feeding supplement  237 mL Oral TID BM   gabapentin   300 mg Oral BID   insulin  aspart  0-15 Units Subcutaneous TID WC   insulin  aspart  0-5 Units Subcutaneous QHS   levothyroxine   25 mcg Oral Q0600   multivitamin with minerals  1 tablet Oral Daily   pantoprazole   40 mg Oral Daily   senna-docusate  2 tablet Oral BID   sodium chloride  flush  3 mL Intravenous Q12H   Continuous:  ampicillin -sulbactam (UNASYN ) IV 3  g (07/24/23 1434)   PRN:acetaminophen , HYDROmorphone  (DILAUDID ) injection, ipratropium-albuterol , ondansetron  (ZOFRAN ) IV, mouth rinse, oxyCODONE , polyethylene glycol  Assessment/Plan: 73 year old female with history of OSA on CPAP, obesity, and HTN presented with acute cholecystitis. She had subtotal cholecystectomy performed on 1/9 and she developed a bile leak after the procedure.  HIDA scan confirmed this bile leak.  Patient went for ERCP yesterday with biliary sphincterotomy performed and a temporary plastic  stent placed into the pancreatic duct as well as into the common bile duct.  Patient has no signs of post ERCP complications.  In fact it appears that the patient's abdominal pain has improved and that her JP drainage has decreased. LFTs remain normal -Follow-up gastric biopsies from ERCP -Continue to monitor drain output and patient will need an interval ERCP in 2 months to see if the CBD stent can be removed or if she may need another type of stent in the future -Avoid VTE prophylaxis for 48 hours after ERCP procedure -Plan is for repeat KUB in 2 weeks to make sure that her pancreatic stent has migrated -Dr. Brice Campi has already asked for appropriate GI follow-up -GI will sign off for now.  Please call back if any new questions arise.   LOS: 6 days   Tricia Dodson 07/24/2023, 6:04 PM

## 2023-07-24 NOTE — Progress Notes (Signed)
 [facility] Progress Note  1 Day Post-Op  Subjective: CC:  Abdominal pain improved, states she is actually doing quite well this morning. Denies nausea or vomiting.  Denies any other complaints  Objective: Vital signs in last 24 hours: Temp:  [97.3 F (36.3 C)-98.8 F (37.1 C)] 97.5 F (36.4 C) (01/15 0743) Pulse Rate:  [52-88] 63 (01/15 0743) Resp:  [15-20] 16 (01/15 0743) BP: (132-157)/(69-87) 139/79 (01/15 0743) SpO2:  [93 %-100 %] 100 % (01/15 0743) Last BM Date : 07/21/23  Intake/Output from previous day: 01/14 0701 - 01/15 0700 In: 500 [I.V.:500] Out: 90 [Drains:90] Intake/Output this shift: No intake/output data recorded.  PE: Gen:  Alert, NAD, pleasant Card:  Regular rate and rhythm Pulm:  non-labored Abd: Soft, minimal if any tenderness.  Incisions are healing appropriately.  There is no erythema.  JP drain is now serosanguineous, no evidence of frank bile within the draining the longer.  Skin: warm and dry, no rashes  Psych: A&Ox3   Lab Results:  Recent Labs    07/23/23 0602  WBC 10.5  HGB 12.7  HCT 39.8  PLT 361   BMET Recent Labs    07/23/23 0602 07/24/23 0846  NA 142 146*  K 3.9 4.3  CL 105 107  CO2 27 27  GLUCOSE 133* 127*  BUN <5* 7*  CREATININE 0.84 1.39*  CALCIUM  8.4* 8.1*   PT/INR No results for input(s): "LABPROT", "INR" in the last 72 hours.  CMP     Component Value Date/Time   NA 146 (H) 07/24/2023 0846   K 4.3 07/24/2023 0846   CL 107 07/24/2023 0846   CO2 27 07/24/2023 0846   GLUCOSE 127 (H) 07/24/2023 0846   BUN 7 (L) 07/24/2023 0846   CREATININE 1.39 (H) 07/24/2023 0846   CALCIUM  8.1 (L) 07/24/2023 0846   PROT 5.8 (L) 07/24/2023 0846   ALBUMIN 2.2 (L) 07/24/2023 0846   AST 18 07/24/2023 0846   ALT 21 07/24/2023 0846   ALKPHOS 96 07/24/2023 0846   BILITOT 0.5 07/24/2023 0846   GFRNONAA 40 (L) 07/24/2023 0846   Lipase     Component Value Date/Time   LIPASE 22 07/16/2023 1522        Studies/Results: DG ERCP Result Date: 07/24/2023 CLINICAL DATA:  Possible choledocholithiasis EXAM: ERCP TECHNIQUE: Multiple spot images obtained with the fluoroscopic device and submitted for interpretation post-procedure. FLUOROSCOPY: Radiation Exposure Index (as provided by the fluoroscopic device): 69.8 mGy Kerma COMPARISON:  None Available. FINDINGS: Seven intraoperative saved images are submitted for review. The images demonstrate a flexible duodenal scope in the descending duodenum followed by wire cannulation of the pancreatic and common bile ducts. Subsequent images demonstrate placement of a plastic biliary stent. Cholecystectomy clips are present suggesting prior cholecystectomy. IMPRESSION: ERCP with placement of a plastic biliary stent. These images were submitted for radiologic interpretation only. Please see the procedural report for the amount of contrast and the fluoroscopy time utilized. Electronically Signed   By: Fernando Hoyer M.D.   On: 07/24/2023 08:00   DG C-Arm 1-60 Min-No Report Result Date: 07/23/2023 Fluoroscopy was utilized by the requesting physician.  No radiographic interpretation.   NM HEPATOBILIARY LEAK (POST-SURGICAL) Result Date: 07/22/2023 CLINICAL DATA:  Prior cholecystectomy 07/18/2023.  Possible leak. EXAM: NUCLEAR MEDICINE HEPATOBILIARY IMAGING TECHNIQUE: Sequential images of the abdomen were obtained out to 60 minutes following intravenous administration of radiopharmaceutical. The drainage tube was clamped by the technologist prior to acquiring images. RADIOPHARMACEUTICALS:  5.4 mCi Tc-22m  Choletec  IV  COMPARISON:  Pre-surgical MRI 07/18/2023.  CT 07/16/2023. FINDINGS: Prompt uptake and biliary excretion of activity by the liver is seen. Biliary activity passes into small bowel, consistent with patent common bile duct. Beginning at approximately 50 minutes, there is accumulation of radiotracer lateral to the common duct in the location of the previous  gallbladder. This has worrisome for a leak. IMPRESSION: Accumulation radiotracer lateral to the common duct in the presumed location of the gallbladder fossa worrisome for leak. If needed additional cross-sectional imaging such as CT or MRI could be considered as clinically appropriate to further delineate. Electronically Signed   By: Adrianna Horde M.D.   On: 07/22/2023 12:52     Anti-infectives: Anti-infectives (From admission, onward)    Start     Dose/Rate Route Frequency Ordered Stop   07/19/23 1800  Ampicillin -Sulbactam (UNASYN ) 3 g in sodium chloride  0.9 % 100 mL IVPB        3 g 200 mL/hr over 30 Minutes Intravenous Every 6 hours 07/19/23 1335     07/18/23 0830  ceFAZolin  (ANCEF ) 3 g in sodium chloride  0.9 % 100 mL IVPB  Status:  Discontinued        3 g 200 mL/hr over 30 Minutes Intravenous On call to O.R. 07/18/23 0818 07/18/23 1134   07/18/23 0814  sodium chloride  0.9 % with ceFAZolin  (ANCEF ) ADS Med       Note to Pharmacy: Clydell Darnel E: cabinet override      07/18/23 0814 07/18/23 2029   07/17/23 1430  ceFAZolin  (ANCEF ) 3 g in sodium chloride  0.9 % 100 mL IVPB  Status:  Discontinued        3 g 200 mL/hr over 30 Minutes Intravenous On call to O.R. 07/17/23 1352 07/17/23 1822   07/17/23 1403  sodium chloride  0.9 % with ceFAZolin  (ANCEF ) ADS Med  Status:  Discontinued       Note to Pharmacy: Franceen Inches: cabinet override      07/17/23 1403 07/17/23 1741   07/17/23 0800  piperacillin -tazobactam (ZOSYN ) IVPB 3.375 g  Status:  Discontinued        3.375 g 12.5 mL/hr over 240 Minutes Intravenous Every 8 hours 07/17/23 0759 07/19/23 1335   07/16/23 1945  piperacillin -tazobactam (ZOSYN ) IVPB 3.375 g        3.375 g 100 mL/hr over 30 Minutes Intravenous  Once 07/16/23 1934 07/16/23 2228        Assessment/Plan  Acute gangrenous cholecystitis POD#6 s/p subtotal cholecystectomy 1/9 Dr. Davonna Estes   ERCP, difficult, 07/23/23 - +bile leak, successfully stented   AFVSS,WBC normal,  creatinine has normalized, LFTs are pending Continue to monitor. GI following  LFTs normal  Diet as per GI JP to bulb suction  Unasyn  SCDs; holding chemical dvt ppx until cleared for this by GI   LOS: 6 days   Beatris Lincoln, MD [facility], [facility]

## 2023-07-24 NOTE — Progress Notes (Signed)
 PROGRESS NOTE    Tricia Dodson  WFU:[id number] DOB: February 08, 1951 DOA: 07/16/2023 PCP: Adra Alanis, FNP   Brief Narrative: Tricia Dodson is a 73 y.o. female with a history of sleep apnea on CPAP, obesity.  Patient presented secondary to right upper quadrant and back pain and was found to have evidence of acute cholecystitis.  General surgery was consulted.  Patient started empirically on IV antibiotics.  General surgery performed a subtotal cholecystectomy.  Hospitalization complicated by development of bile leak requiring ERCP with sphincterotomy, common bile duct and distal pancreatic duct stent placement..   Assessment and Plan:  Acute cholecystitis Patient seen and evaluated by general surgery.  Patient underwent subtotal cholecystectomy by general surgery; drain placed.  Complicated by development of bile leak.  Bile leak Complication of cholecystectomy.  Gastroenterology was consulted and performed an ERCP on 1/14 which confirmed a bile leak; biliary stent durotomy was performed in addition to sweeping of sludge from biliary tree.  Plastic stent placed into the common bile duct and temporary plastic placed into the distal pancreatic duct.  GI recommendations for KUB 2 view in 10 to 14 days to ensure pancreatic stent has migrated successfully; if not migrated, patient will need repeat EGD for stent pull.  Erythematous stomach mucosa Noted during ERCP.  Biopsy performed for H. pylori assessment.  AKI Initial resolution, now worsened likely secondary to poor oral intake from NPO status. Creatinine peak of 1.39 -Encourage increased PO intake -Recheck BMP in AM; if still worsening, will give IV fluids  Strep anginosus bacteremia Presumed sources intra-abdominal from cholecystitis.  Patient managed on Zosyn  IV and transition to Unasyn  IV.  Echo obtained on 07/19/23 and was significant for no evidence of valvular lesions.  Repeat blood cultures (1/11) and negative for ongoing  growth -Continue Unasyn  IV -ID recommendations prior to discharge  Primary hypertension Patient is managed on losartan  as an outpatient, which was held on admission.  Blood pressure has been normotensive to slightly hypertensive this admission.  Obstructive sleep apnea -Continue CPAP  Hypothyroidism -Continue levothyroxine   Hypokalemia Patient given potassium supplementation.  Resolved.  Hypernatremia Mild.  Likely secondary to poor oral intake. 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 -Continue gabapentin   Obesity, class II Estimated body mass index is 39.59 kg/m as calculated from the following:   Height as of this encounter: 5\' 7"  (1.702 m).   Weight as of this encounter: 114.7 kg.   DVT prophylaxis: SCDs Code Status:   Code Status: Full Code Family Communication: None at bedside Disposition Plan: Discharge home likely in 1-2 days pending GI and general surgery recommendations   Consultants:  General surgery [facility]  Procedures:  Laparoscopic subtotal cholecystectomy ERCP  Antimicrobials: Zosyn  IV Unasyn  IV   Subjective: Some intermittent abdominal pain, but no nausea or vomiting.  Objective: BP 139/79 (BP Location: Left Arm)   Pulse 63   Temp (!) 97.5 F (36.4 C)   Resp 16   Ht 5\' 7"  (1.702 m)   Wt 114.3 kg   SpO2 100%   BMI 39.47 kg/m   Examination:  General exam: Appears calm and comfortable Respiratory system: Clear to auscultation. Respiratory effort normal. Cardiovascular system: S1 & S2 heard, RRR. No murmurs, rubs, gallops or clicks. Gastrointestinal system: Abdomen is nondistended, soft and mildly tender in RUQ. Normal bowel sounds heard. Bloody output from drain Central nervous system: Alert and oriented. No focal neurological deficits. Musculoskeletal: No edema.  No calf tenderness Skin: No cyanosis. No rashes Psychiatry:  Judgement and insight appear normal. Mood & affect appropriate.    Data Reviewed: I have personally reviewed following labs and imaging studies  CBC Lab Results  Component Value Date   WBC 10.5 07/23/2023   RBC 4.63 07/23/2023   HGB 12.7 07/23/2023   HCT 39.8 07/23/2023   MCV 86.0 07/23/2023   MCH 27.4 07/23/2023   PLT 361 07/23/2023   MCHC 31.9 07/23/2023   RDW 13.0 07/23/2023   LYMPHSABS 2.0 07/16/2023   MONOABS 1.2 (H) 07/16/2023   EOSABS 0.3 07/16/2023   BASOSABS 0.1 07/16/2023     Last metabolic panel Lab Results  Component Value Date   NA 142 07/23/2023   K 3.9 07/23/2023   CL 105 07/23/2023   CO2 27 07/23/2023   BUN <5 (L) 07/23/2023   CREATININE 0.84 07/23/2023   GLUCOSE 133 (H) 07/23/2023   GFRNONAA >60 07/23/2023   CALCIUM  8.4 (L) 07/23/2023   PROT 5.8 (L) 07/23/2023   ALBUMIN 2.3 (L) 07/23/2023   BILITOT 0.5 07/23/2023   ALKPHOS 89 07/23/2023   AST 24 07/23/2023   ALT 23 07/23/2023   ANIONGAP 10 07/23/2023    GFR: Estimated Creatinine Clearance: 79 mL/min (by C-G formula based on SCr of 0.84 mg/dL).  Recent Results (from the past 240 hours)  Blood culture (routine x 2)     Status: Abnormal   Collection Time: 07/16/23  8:14 PM   Specimen: Right Antecubital; Blood  Result Value Ref Range Status   Specimen Description   Final    RIGHT ANTECUBITAL BLOOD Performed at [facility], [address], [place], [place] [address]    Special Requests   Final    BOTTLES DRAWN AEROBIC AND ANAEROBIC Blood Culture adequate volume Performed at [facility], [address] [place], [place] [address]    Culture  Setup Time   Final    GRAM POSITIVE COCCI AEROBIC BOTTLE ONLY CRITICAL RESULT CALLED TO, READ BACK BY AND VERIFIED WITH: PHARMD J LEDFORD 07/18/2023 @ 0151 BY AB Performed at [facility], [address], [place], [place] [address]    Culture STREPTOCOCCUS ANGINOSIS (A)  Final   Report Status 07/20/2023 FINAL  Final   Organism  ID, Bacteria STREPTOCOCCUS ANGINOSIS  Final      Susceptibility   Streptococcus anginosis - MIC*    PENICILLIN <=0.06 SENSITIVE Sensitive     CEFTRIAXONE <=0.12 SENSITIVE Sensitive     ERYTHROMYCIN <=0.12 SENSITIVE Sensitive     LEVOFLOXACIN  0.5 SENSITIVE Sensitive     VANCOMYCIN 0.5 SENSITIVE Sensitive     * STREPTOCOCCUS ANGINOSIS  Blood culture (routine x 2)     Status: Abnormal   Collection Time: 07/16/23  8:14 PM   Specimen: Left Antecubital; Blood  Result Value Ref Range Status   Specimen Description   Final    LEFT ANTECUBITAL Performed at [facility], [address] [place], [place] [address]    Special Requests   Final    BOTTLES DRAWN AEROBIC AND ANAEROBIC Blood Culture adequate volume Performed at [facility], [address] [place], [place] [address]    Culture  Setup Time   Final    GRAM POSITIVE COCCI AEROBIC BOTTLE ONLY CRITICAL RESULT CALLED TO, READ BACK BY AND VERIFIED WITH: PHARMD ANDREW MEYER ON 07/18/23 @ 2021 BY DRT    Culture (A)  Final    STAPHYLOCOCCUS CAPITIS  THE SIGNIFICANCE OF ISOLATING THIS ORGANISM FROM A SINGLE SET OF BLOOD CULTURES WHEN MULTIPLE SETS ARE DRAWN IS UNCERTAIN. PLEASE NOTIFY THE MICROBIOLOGY DEPARTMENT WITHIN ONE WEEK IF SPECIATION AND SENSITIVITIES ARE REQUIRED. Performed at [facility], [address], [place], [place] [address]    Report Status 07/22/2023 FINAL  Final  Blood Culture ID Panel (Reflexed)     Status: Abnormal   Collection Time: 07/16/23  8:14 PM  Result Value Ref Range Status   Enterococcus faecalis NOT DETECTED NOT DETECTED Final   Enterococcus Faecium NOT DETECTED NOT DETECTED Final   Listeria monocytogenes NOT DETECTED NOT DETECTED Final   Staphylococcus species NOT DETECTED NOT DETECTED Final   Staphylococcus aureus (BCID) NOT DETECTED NOT DETECTED Final   Staphylococcus epidermidis NOT DETECTED NOT DETECTED Final   Staphylococcus lugdunensis NOT DETECTED NOT DETECTED Final    Streptococcus species DETECTED (A) NOT DETECTED Final    Comment: Not Enterococcus species, Streptococcus agalactiae, Streptococcus pyogenes, or Streptococcus pneumoniae. CRITICAL RESULT CALLED TO, READ BACK BY AND VERIFIED WITH: PHARMD J LEDFORD 07/18/2023 @ 0151 BY AB    Streptococcus agalactiae NOT DETECTED NOT DETECTED Final   Streptococcus pneumoniae NOT DETECTED NOT DETECTED Final   Streptococcus pyogenes NOT DETECTED NOT DETECTED Final   A.calcoaceticus-baumannii NOT DETECTED NOT DETECTED Final   Bacteroides fragilis NOT DETECTED NOT DETECTED Final   Enterobacterales NOT DETECTED NOT DETECTED Final   Enterobacter cloacae complex NOT DETECTED NOT DETECTED Final   Escherichia coli NOT DETECTED NOT DETECTED Final   Klebsiella aerogenes NOT DETECTED NOT DETECTED Final   Klebsiella oxytoca NOT DETECTED NOT DETECTED Final   Klebsiella pneumoniae NOT DETECTED NOT DETECTED Final   Proteus species NOT DETECTED NOT DETECTED Final   Salmonella species NOT DETECTED NOT DETECTED Final   Serratia marcescens NOT DETECTED NOT DETECTED Final   Haemophilus influenzae NOT DETECTED NOT DETECTED Final   Neisseria meningitidis NOT DETECTED NOT DETECTED Final   Pseudomonas aeruginosa NOT DETECTED NOT DETECTED Final   Stenotrophomonas maltophilia NOT DETECTED NOT DETECTED Final   Candida albicans NOT DETECTED NOT DETECTED Final   Candida auris NOT DETECTED NOT DETECTED Final   Candida glabrata NOT DETECTED NOT DETECTED Final   Candida krusei NOT DETECTED NOT DETECTED Final   Candida parapsilosis NOT DETECTED NOT DETECTED Final   Candida tropicalis NOT DETECTED NOT DETECTED Final   Cryptococcus neoformans/gattii NOT DETECTED NOT DETECTED Final    Comment: Performed at [facility], [address], [place], [place] [address]  Surgical pcr screen     Status: Abnormal   Collection Time: 07/18/23  3:44 AM   Specimen: Nasal Mucosa; Nasal Swab  Result Value Ref Range Status   MRSA, PCR NEGATIVE  NEGATIVE Final   Staphylococcus aureus POSITIVE (A) NEGATIVE Final    Comment: (NOTE) The Xpert SA Assay (FDA approved for NASAL specimens in patients 64 years of age and older), is one component of a comprehensive surveillance program. It is not intended to diagnose infection nor to guide or monitor treatment. Performed at [facility], [address], [place], [place] [address]   Blood Culture ID Panel (Reflexed)     Status: Abnormal   Collection Time: 07/18/23  8:14 PM  Result Value Ref Range Status   Enterococcus faecalis NOT DETECTED NOT DETECTED Final   Enterococcus Faecium NOT DETECTED NOT DETECTED Final   Listeria monocytogenes NOT DETECTED NOT DETECTED Final   Staphylococcus species DETECTED (A) NOT DETECTED Final    Comment:  CRITICAL RESULT CALLED TO, READ BACK BY AND VERIFIED WITH: PHARMD ANDREW MEYER ON 07/18/23 @ 2038 BY DRT    Staphylococcus aureus (BCID) NOT DETECTED NOT DETECTED Final   Staphylococcus epidermidis NOT DETECTED NOT DETECTED Final   Staphylococcus lugdunensis NOT DETECTED NOT DETECTED Final   Streptococcus species NOT DETECTED NOT DETECTED Final   Streptococcus agalactiae NOT DETECTED NOT DETECTED Corrected    Comment: CORRECTED ON 01/10 AT 0815: PREVIOUSLY REPORTED AS NOT DETECTED CRITICAL RESULT CALLED TO, READ BACK BY AND VERIFIED WITH: PHARMD ANDREW MEYER ON 07/18/23 @ 2038 BY DRT   Streptococcus pneumoniae NOT DETECTED NOT DETECTED Final   Streptococcus pyogenes NOT DETECTED NOT DETECTED Final   A.calcoaceticus-baumannii NOT DETECTED NOT DETECTED Final   Bacteroides fragilis NOT DETECTED NOT DETECTED Final   Enterobacterales NOT DETECTED NOT DETECTED Final   Enterobacter cloacae complex NOT DETECTED NOT DETECTED Final   Escherichia coli NOT DETECTED NOT DETECTED Final   Klebsiella aerogenes NOT DETECTED NOT DETECTED Final   Klebsiella oxytoca NOT DETECTED NOT DETECTED Final   Klebsiella pneumoniae NOT DETECTED NOT DETECTED Final   Proteus  species NOT DETECTED NOT DETECTED Final   Salmonella species NOT DETECTED NOT DETECTED Final   Serratia marcescens NOT DETECTED NOT DETECTED Final   Haemophilus influenzae NOT DETECTED NOT DETECTED Final   Neisseria meningitidis NOT DETECTED NOT DETECTED Final   Pseudomonas aeruginosa NOT DETECTED NOT DETECTED Final   Stenotrophomonas maltophilia NOT DETECTED NOT DETECTED Final   Candida albicans NOT DETECTED NOT DETECTED Final   Candida auris NOT DETECTED NOT DETECTED Final   Candida glabrata NOT DETECTED NOT DETECTED Final   Candida krusei NOT DETECTED NOT DETECTED Final   Candida parapsilosis NOT DETECTED NOT DETECTED Final   Candida tropicalis NOT DETECTED NOT DETECTED Final   Cryptococcus neoformans/gattii NOT DETECTED NOT DETECTED Final    Comment: Performed at [facility], [address], [place], [place] [address]  Culture, blood (Routine X 2) w Reflex to ID Panel     Status: None (Preliminary result)   Collection Time: 07/20/23  4:56 AM   Specimen: BLOOD  Result Value Ref Range Status   Specimen Description BLOOD BLOOD RIGHT HAND  Final   Special Requests   Final    BOTTLES DRAWN AEROBIC AND ANAEROBIC Blood Culture results may not be optimal due to an inadequate volume of blood received in culture bottles   Culture   Final    NO GROWTH 4 DAYS Performed at [facility], [address] [place], [place] [address]    Report Status PENDING  Incomplete  Culture, blood (Routine X 2) w Reflex to ID Panel     Status: None (Preliminary result)   Collection Time: 07/20/23  4:57 AM   Specimen: BLOOD  Result Value Ref Range Status   Specimen Description BLOOD BLOOD RIGHT HAND  Final   Special Requests   Final    BOTTLES DRAWN AEROBIC AND ANAEROBIC Blood Culture results may not be optimal due to an inadequate volume of blood received in culture bottles   Culture   Final    NO GROWTH 4 DAYS Performed at [facility], [address] [place], [place] [address]     Report Status PENDING  Incomplete      Radiology Studies: DG ERCP Result Date: 07/24/2023 CLINICAL DATA:  Possible choledocholithiasis EXAM: ERCP TECHNIQUE: Multiple spot images obtained with the fluoroscopic device and submitted for interpretation post-procedure. FLUOROSCOPY: Radiation Exposure Index (as provided  by the fluoroscopic device): 69.8 mGy Kerma COMPARISON:  None Available. FINDINGS: Seven intraoperative saved images are submitted for review. The images demonstrate a flexible duodenal scope in the descending duodenum followed by wire cannulation of the pancreatic and common bile ducts. Subsequent images demonstrate placement of a plastic biliary stent. Cholecystectomy clips are present suggesting prior cholecystectomy. IMPRESSION: ERCP with placement of a plastic biliary stent. These images were submitted for radiologic interpretation only. Please see the procedural report for the amount of contrast and the fluoroscopy time utilized. Electronically Signed   By: Fernando Hoyer M.D.   On: 07/24/2023 08:00   DG C-Arm 1-60 Min-No Report Result Date: 07/23/2023 Fluoroscopy was utilized by the requesting physician.  No radiographic interpretation.   NM HEPATOBILIARY LEAK (POST-SURGICAL) Result Date: 07/22/2023 CLINICAL DATA:  Prior cholecystectomy 07/18/2023.  Possible leak. EXAM: NUCLEAR MEDICINE HEPATOBILIARY IMAGING TECHNIQUE: Sequential images of the abdomen were obtained out to 60 minutes following intravenous administration of radiopharmaceutical. The drainage tube was clamped by the technologist prior to acquiring images. RADIOPHARMACEUTICALS:  5.4 mCi Tc-21m  Choletec  IV COMPARISON:  Pre-surgical MRI 07/18/2023.  CT 07/16/2023. FINDINGS: Prompt uptake and biliary excretion of activity by the liver is seen. Biliary activity passes into small bowel, consistent with patent common bile duct. Beginning at approximately 50 minutes, there is accumulation of radiotracer lateral to the common  duct in the location of the previous gallbladder. This has worrisome for a leak. IMPRESSION: Accumulation radiotracer lateral to the common duct in the presumed location of the gallbladder fossa worrisome for leak. If needed additional cross-sectional imaging such as CT or MRI could be considered as clinically appropriate to further delineate. Electronically Signed   By: Adrianna Horde M.D.   On: 07/22/2023 12:52      LOS: 6 days    Aneita Keens, MD [facility] 07/24/2023, 9:25 AM   If 7PM-7AM, please contact night-coverage [email or web address]

## 2023-07-24 NOTE — Telephone Encounter (Signed)
-----   Message from [facility] sent at 07/23/2023  5:17 PM EST ----- Regarding: Pancreatic and biliary stent follow-up Sonika Levins, Please place this patient in for KUB in 2 weeks for follow-up of pancreatic stent. Place ERCP recall in 2 months. Thanks. GM

## 2023-07-24 NOTE — Progress Notes (Addendum)
 Initial Nutrition Assessment  DOCUMENTATION CODES:   Morbid obesity  INTERVENTION:   Diet advancement per MD Ensure Enlive po BID, each supplement provides 350 kcal and 20 grams of protein. MVI with minerals daily   NUTRITION DIAGNOSIS:   Inadequate oral intake related to altered GI function as evidenced by  (Being on full liquids and s/p cholecystectomy).  GOAL:   Patient will meet greater than or equal to 90% of their needs   MONITOR:   PO intake, Weight trends, Supplement acceptance, Diet advancement, Labs  REASON FOR ASSESSMENT:   NPO/Clear Liquid Diet    ASSESSMENT:  73 y.o. year old female with a history of HTN, OSA on CPAP, obesity, GERD, HLD, Thyroid  disease, T2DM. Admitted with acute cholecystitis, s/p subtotal cholecystectomy 1/9.  1/9 - cholecystectomy  1/13 - HIDA scan  1/14 - ERCP: bile leak, stented  Patient has been NPO/CL from 1/7-1/10 then advanced to full liquids from 1/11-1/14. Due to surgery and concern for bile leak, now confirmed with ERCP and patient was stented. No oral nutritional supplements started during this time. Weight stable.  Pt resting in chair on visit. She reports she has been feeling "queasy" and having some abdominal pain for several months. She would have this pain throughout the day, reports it is not related to her eating. She states it did not stop her from eating and she still had an appetite and was eating good until 4 days PTA where she had diminished appetite. She does not endorse any recent weight loss and states her usual body weight is around 250-260 lbs. She does not take insulin  at home.   She has a good appetite right now, 100% of breakfast consumed. She states she is ready to try new foods. No noted N/V. Does complain of having loose stools, had x3 1/13.  Pt currently on full liquids, messaged MD about advancing to softs if able.   Admit weight: 115 kg  Current weight: 114.7 kg    Average Meal Intake: No meals  recorded  Nutritionally Relevant Medications: Scheduled Meds:  feeding supplement  237 mL Oral TID BM   gabapentin   300 mg Oral BID   insulin  aspart  0-15 Units Subcutaneous TID WC   insulin  aspart  0-5 Units Subcutaneous QHS   levothyroxine   25 mcg Oral Q0600   senna-docusate  2 tablet Oral BID   sodium chloride  flush  3 mL Intravenous Q12H   Continuous Infusions:  ampicillin -sulbactam (UNASYN ) IV 3 g (07/24/23 0810)   Labs Reviewed: Sodium 146, BUN 7, Creatinine 1.39, Calcium  8.1,  CBG ranges from 109-289 mg/dL over the last 24 hours HgbA1c 7.3  NUTRITION - FOCUSED PHYSICAL EXAM:  Flowsheet Row Most Recent Value  Orbital Region No depletion  Upper Arm Region No depletion  Thoracic and Lumbar Region No depletion  Buccal Region No depletion  Temple Region No depletion  Clavicle Bone Region No depletion  Clavicle and Acromion Bone Region No depletion  Scapular Bone Region No depletion  Dorsal Hand No depletion  Patellar Region No depletion  Anterior Thigh Region No depletion  Posterior Calf Region No depletion  Edema (RD Assessment) None  Hair Reviewed  Eyes Reviewed  Mouth Reviewed  Skin Reviewed  Nails Reviewed       Diet Order:   Diet Order             Diet full liquid Room service appropriate? Yes; Fluid consistency: Thin  Diet effective now  EDUCATION NEEDS:   Education needs have been addressed  Skin:  Skin Assessment: Skin Integrity Issues: Skin Integrity Issues:: Incisions Incisions: clsoed abdomen  Last BM:  1/12 type 4  Height:   Ht Readings from Last 1 Encounters:  07/17/23 5\' 7"  (1.702 m)    Weight:   Wt Readings from Last 1 Encounters:  07/24/23 114.7 kg    Ideal Body Weight:  61.36 kg  BMI:  Body mass index is 39.59 kg/m.  Estimated Nutritional Needs:   Kcal:  1700-1900 kcal  Protein:  100-120 gm  Fluid:  >1.7L  Frederik Jansky, RD Registered Dietitian  See Amion for more information

## 2023-07-24 NOTE — Progress Notes (Signed)
 PT Cancellation Note  Patient Details Name: Tricia Dodson MRN: [id number] DOB: 10-Mar-1951   Cancelled Treatment:    Reason Eval/Treat Not Completed: (P) Pain limiting ability to participate (Attempt 1819 and 1830, pt in bathroom then defers on second attempt due to elevated R abdominal pain.) RN notified of pt request for pain meds and plan to reattempt next date per PT plan of care as schedule permits to ensure pt able to negotiate stairs and that RW fits properly.    Mariel Shope Dewon Mendizabal 07/24/2023, 6:55 PM

## 2023-07-24 NOTE — Evaluation (Addendum)
 Occupational Therapy Evaluation/Discharge Patient Details Name: Tricia Dodson MRN: [id number] DOB: 08-12-1950 Today's Date: 07/24/2023   History of Present Illness 73 y.o. year old female admitted with acute cholecystitis, s/p subtotal cholecystectomy 1/9. Due to continued bile leak, pt underwent ERCP w/ stent placement 1/14. PMH indicates: history of HTN, OSA on CPAP, and obesity.   Clinical Impression   PTA, pt lives alone and typically completely Independent with all ADLs, IADLs and mobility without AD. Pt works part time in Designer, industrial/product work at [employer] Pt presents now with minor deficits in abdominal pain. Pt reports feeling more stable with RW; able to mobilize around unit and manage ADLs with Modified Independence using RW. Discussed ADL modifications, IADLs mgmt and considerations for driving/returning to work. Pt denies concerns managing at home but would benefit from RW prior to DC for home use. Encouraged continued mobility while admitted w/ no further skilled OT services needed at this time.        If plan is discharge home, recommend the following: Assist for transportation;Assistance with cooking/housework    Functional Status Assessment  Patient has not had a recent decline in their functional status  Equipment Recommendations  Other (comment) (RW)    Recommendations for Other Services       Precautions / Restrictions Precautions Precautions: Other (comment) Precaution Comments: JP drain Restrictions Weight Bearing Restrictions Per Provider Order: No      Mobility Bed Mobility               General bed mobility comments: in recliner on entry    Transfers Overall transfer level: Modified independent Equipment used: Rolling walker (2 wheels)                      Balance Overall balance assessment: Modified Independent                                         ADL either performed or assessed with clinical judgement   ADL  Overall ADL's : Modified independent;At baseline                                       General ADL Comments: Pt reports feeling more stable with RW and able to demo safe mobility around unit with it. Pt reports ambulating to bathroom with RW without assist and denies concerns. Pt typically does not wear socks/shoes inside the house and outside will wear slip on shoes at baseline due to difficulty reaching feet. Discussed ADL modifications, IADL aspects to be mindful of (groceries which pt reports she can order online, use of reacher to pick up items from floor, placing pots/pans out on stove to avoid frequent bending and discussed cat care). Discussed driving and work duties (avoid heavy lifting) - anticipate quick return to both of these activities     Vision Baseline Vision/History: 1 Wears glasses Ability to See in Adequate Light: 0 Adequate Patient Visual Report: No change from baseline Vision Assessment?: No apparent visual deficits     Perception         Praxis         Pertinent Vitals/Pain Pain Assessment Pain Assessment: Faces Faces Pain Scale: Hurts a little bit Pain Location: R side of abdomen Pain Descriptors / Indicators: Sore Pain Intervention(s): Monitored during session, Limited activity  within patient's tolerance     Extremity/Trunk Assessment Upper Extremity Assessment Upper Extremity Assessment: Overall WFL for tasks assessed;Right hand dominant   Lower Extremity Assessment Lower Extremity Assessment: Defer to PT evaluation   Cervical / Trunk Assessment Cervical / Trunk Assessment: Normal   Communication Communication Communication: No apparent difficulties   Cognition Arousal: Alert Behavior During Therapy: WFL for tasks assessed/performed Overall Cognitive Status: Within Functional Limits for tasks assessed                                       General Comments       Exercises     Shoulder Instructions       Home Living Family/patient expects to be discharged to:: Private residence Living Arrangements: Alone Available Help at Discharge: Friend(s);Available PRN/intermittently;Family Type of Home: House Home Access: Level entry     Home Layout: Two level Alternate Level Stairs-Number of Steps: 19 Alternate Level Stairs-Rails: Right;Left (Right on first half, Left on second half) Bathroom Shower/Tub: Producer, television/film/video: Standard     Home Equipment: Cane - single point          Prior Functioning/Environment Prior Level of Function : Independent/Modified Independent;Driving;Working/employed             Mobility Comments: no AD ADLs Comments: Driving, grocery shopping, independent and caring for her 2 cats. 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)        OT Problem List: Pain      OT Treatment/Interventions:      OT Goals(Current goals can be found in the care plan section) Acute Rehab OT Goals Patient Stated Goal: home soon, get back to work OT Goal Formulation: All assessment and education complete, DC therapy  OT Frequency:      Co-evaluation              AM-PAC OT "6 Clicks" Daily Activity     Outcome Measure Help from another person eating meals?: None Help from another person taking care of personal grooming?: None Help from another person toileting, which includes using toliet, bedpan, or urinal?: None Help from another person bathing (including washing, rinsing, drying)?: None Help from another person to put on and taking off regular upper body clothing?: None Help from another person to put on and taking off regular lower body clothing?: None 6 Click Score: 24   End of Session Equipment Utilized During Treatment: Rolling walker (2 wheels) Nurse Communication: Mobility status  Activity Tolerance: Patient tolerated treatment well Patient left: in chair;with call bell/phone within reach;with nursing/sitter in  room  OT Visit Diagnosis: Pain Pain - part of body:  (abdomen)                Time: 1610-9604 OT Time Calculation (min): 25 min Charges:  OT General Charges $OT Visit: 1 Visit OT Evaluation $OT Eval Low Complexity: 1 Low  Lawrence Pretty, OTR/L Acute Rehab Services Office: [phone]   Annabella Barr 07/24/2023, 1:33 PM

## 2023-07-24 NOTE — Hospital Course (Addendum)
Tricia Dodson is a 73 y.o. female with a history of sleep apnea on CPAP, obesity.  Patient presented secondary to right upper quadrant and back pain and was found to have evidence of acute cholecystitis.  General surgery was consulted.  Patient started empirically on IV antibiotics.  General surgery performed a subtotal cholecystectomy.  Hospitalization complicated by development of bile leak requiring ERCP with sphincterotomy, common bile duct and distal pancreatic duct stent placement.. Patient discharged on antibiotics.

## 2023-07-24 NOTE — Progress Notes (Signed)
 Mobility Specialist Progress Note:   07/24/23 1100  Mobility  Activity Ambulated with assistance in hallway  Level of Assistance Standby assist, set-up cues, supervision of patient - no hands on  Assistive Device Front wheel walker  Distance Ambulated (ft) 200 ft  Activity Response Tolerated well  Mobility Referral Yes  Mobility visit 1 Mobility  Mobility Specialist Start Time (ACUTE ONLY) 1018  Mobility Specialist Stop Time (ACUTE ONLY) 1026  Mobility Specialist Time Calculation (min) (ACUTE ONLY) 8 min   Pt received in bed, agreeable to mobility. Stated that pain has improved compared to yesterday. No hands on needed during ambulation, asx throughout. Pt left in chair with call bell in reach and all needs met.   Sofia Dunn  Mobility Specialist Please contact via [employer] office at [phone]

## 2023-07-25 ENCOUNTER — Encounter (HOSPITAL_COMMUNITY): Payer: Self-pay | Admitting: Gastroenterology

## 2023-07-25 ENCOUNTER — Inpatient Hospital Stay (HOSPITAL_COMMUNITY): Payer: Medicare Other

## 2023-07-25 ENCOUNTER — Encounter: Payer: Self-pay | Admitting: Gastroenterology

## 2023-07-25 DIAGNOSIS — K9189 Other postprocedural complications and disorders of digestive system: Secondary | ICD-10-CM | POA: Diagnosis not present

## 2023-07-25 DIAGNOSIS — K838 Other specified diseases of biliary tract: Secondary | ICD-10-CM

## 2023-07-25 DIAGNOSIS — R109 Unspecified abdominal pain: Secondary | ICD-10-CM

## 2023-07-25 DIAGNOSIS — K810 Acute cholecystitis: Secondary | ICD-10-CM | POA: Diagnosis not present

## 2023-07-25 LAB — HEPATIC FUNCTION PANEL
ALT: 19 U/L (ref 0–44)
AST: 17 U/L (ref 15–41)
Albumin: 2.3 g/dL — ABNORMAL LOW (ref 3.5–5.0)
Alkaline Phosphatase: 82 U/L (ref 38–126)
Bilirubin, Direct: <0.1 mg/dL (ref 0.0–0.2)
Total Bilirubin: 0.5 mg/dL (ref 0.0–1.2)
Total Protein: 5.7 g/dL — ABNORMAL LOW (ref 6.5–8.1)

## 2023-07-25 LAB — BASIC METABOLIC PANEL
Anion gap: 10 (ref 5–15)
BUN: 13 mg/dL (ref 8–23)
CO2: 28 mmol/L (ref 22–32)
Calcium: 8.1 mg/dL — ABNORMAL LOW (ref 8.9–10.3)
Chloride: 101 mmol/L (ref 98–111)
Creatinine, Ser: 1.02 mg/dL — ABNORMAL HIGH (ref 0.44–1.00)
GFR, Estimated: 58 mL/min — ABNORMAL LOW (ref >60)
Glucose, Bld: 125 mg/dL — ABNORMAL HIGH (ref 70–99)
Potassium: 3.4 mmol/L — ABNORMAL LOW (ref 3.5–5.1)
Sodium: 139 mmol/L (ref 135–145)

## 2023-07-25 LAB — GLUCOSE, CAPILLARY
Glucose-Capillary: 108 mg/dL — ABNORMAL HIGH (ref 70–99)
Glucose-Capillary: 106 mg/dL — ABNORMAL HIGH (ref 70–99)
Glucose-Capillary: 142 mg/dL — ABNORMAL HIGH (ref 70–99)
Glucose-Capillary: 138 mg/dL — ABNORMAL HIGH (ref 70–99)

## 2023-07-25 LAB — CBC
HCT: 39.3 % (ref 36.0–46.0)
Hemoglobin: 12.6 g/dL (ref 12.0–15.0)
MCH: 27.9 pg (ref 26.0–34.0)
MCHC: 32.1 g/dL (ref 30.0–36.0)
MCV: 86.9 fL (ref 80.0–100.0)
Platelets: 364 10*3/uL (ref 150–400)
RBC: 4.52 MIL/uL (ref 3.87–5.11)
RDW: 13.1 % (ref 11.5–15.5)
WBC: 10.9 10*3/uL — ABNORMAL HIGH (ref 4.0–10.5)
nRBC: 0 % (ref 0.0–0.2)

## 2023-07-25 LAB — CULTURE, BLOOD (ROUTINE X 2)
Culture: NO GROWTH
Culture: NO GROWTH

## 2023-07-25 LAB — MAGNESIUM: Magnesium: 1.9 mg/dL (ref 1.7–2.4)

## 2023-07-25 LAB — LIPASE, BLOOD: Lipase: 50 U/L (ref 11–51)

## 2023-07-25 LAB — SURGICAL PATHOLOGY

## 2023-07-25 MED ORDER — MELATONIN 5 MG PO TABS
5.0000 mg | ORAL_TABLET | Freq: Every evening | ORAL | Status: DC | PRN
  Administered 2023-07-25: 5 mg via ORAL
  Filled 2023-07-25: qty 1

## 2023-07-25 MED ORDER — POTASSIUM CHLORIDE CRYS ER 20 MEQ PO TBCR
40.0000 meq | EXTENDED_RELEASE_TABLET | Freq: Once | ORAL | Status: AC
  Administered 2023-07-25: 40 meq via ORAL
  Filled 2023-07-25: qty 2

## 2023-07-25 NOTE — Progress Notes (Addendum)
Physical Therapy Treatment and Discharge Patient Details Name: Tricia Dodson MRN: [id number] DOB: 1950/07/10 Today's Date: 07/25/2023   History of Present Illness 73 y.o. year old female admitted with acute cholecystitis, s/p ERCP on 1/16 and subtotal cholecystectomy on 1/9. PMH indicates: history of HTN, OSA on CPAP, and obesity.    PT Comments  Patient is progressing well towards goals. After initial refusal and education on the importance of mobility, the patient and was willing to participate with therapy today. Patient required Min A +1 with HHA due to pain, transitioning from supine > sitting at the EOB. Patient did not need require any assistance when transitioning from a sit > stand or for gait training. The patient ambulated 356ft within the unit with no rest breaks. Recommend follow-up with mobility specialist to encourage mobility. No further acute PT needs.     If plan is discharge home, recommend the following: Help with stairs or ramp for entrance   Can travel by private vehicle        Equipment Recommendations  Rolling walker (2 wheels)    Recommendations for Other Services       Precautions / Restrictions Precautions Precautions: Other (comment) Precaution Comments: JP drain Restrictions Weight Bearing Restrictions Per Provider Order: No     Mobility  Bed Mobility Overal bed mobility: Modified Independent Bed Mobility: Supine to Sit     Supine to sit: Modified independent (Device/Increase time)     General bed mobility comments: Required no assitance, demonstrated good technique    Transfers Overall transfer level: Independent Equipment used: Rolling walker (2 wheels)               General transfer comment: Patient was able to perform a STS using UE support from her bed    Ambulation/Gait Ambulation/Gait assistance: Modified independent (Device/Increase time)   Assistive device: Rolling walker (2 wheels) Gait Pattern/deviations:  Step-through pattern       General Gait Details: Reports that she cannot walk longer distances today due to abdomen discomfort   Stairs             Wheelchair Mobility     Tilt Bed    Modified Rankin (Stroke Patients Only)       Balance Overall balance assessment: Independent                                          Cognition Arousal: Alert Behavior During Therapy: WFL for tasks assessed/performed Overall Cognitive Status: Within Functional Limits for tasks assessed                                          Exercises      General Comments        Pertinent Vitals/Pain Pain Assessment Pain Assessment: Faces Faces Pain Scale: Hurts even more Pain Location: R side of abdomen Pain Descriptors / Indicators: Sore, Grimacing Pain Intervention(s): Monitored during session    Home Living                          Prior Function            PT Goals (current goals can now be found in the care plan section) Acute Rehab PT Goals PT Goal Formulation: With patient Time  For Goal Achievement: 08/05/23 Potential to Achieve Goals: Good Progress towards PT goals: Goals met and updated - see care plan    Frequency    Min 1X/week      PT Plan      Co-evaluation              AM-PAC PT "6 Clicks" Mobility   Outcome Measure  Help needed turning from your back to your side while in a flat bed without using bedrails?: A Little Help needed moving from lying on your back to sitting on the side of a flat bed without using bedrails?: A Little Help needed moving to and from a bed to a chair (including a wheelchair)?: None Help needed standing up from a chair using your arms (e.g., wheelchair or bedside chair)?: None Help needed to walk in hospital room?: None Help needed climbing 3-5 steps with a railing? : A Little 6 Click Score: 21    End of Session   Activity Tolerance: Patient tolerated treatment  well;Patient limited by pain (Patient reports that she does not want to practice stairs due to abdominal pain) Patient left: in bed;with call bell/phone within reach   PT Visit Diagnosis: Other abnormalities of gait and mobility (R26.89);Pain;Difficulty in walking, not elsewhere classified (R26.2) Pain - Right/Left: Right Pain - part of body:  (Abdoomen)     Time: 8413-2440 PT Time Calculation (min) (ACUTE ONLY): 17 min  Charges:    $Therapeutic Activity: 8-22 mins PT General Charges $$ ACUTE PT VISIT: 1 Visit                     Doreen Beam, SPT    Tricia Dodson 07/25/2023, 2:42 PM

## 2023-07-25 NOTE — Progress Notes (Signed)
Gastroenterology Inpatient Follow Up    Subjective: Patient states that her abdominal pain worsened slightly last night.  She has also had increased drain output today.  Objective: Vital signs in last 24 hours: Temp:  [98 F (36.7 C)-98.2 F (36.8 C)] 98 F (36.7 C) (01/16 0513) Pulse Rate:  [68-90] 88 (01/16 1659) Resp:  [18] 18 (01/16 1659) BP: (134-148)/(64-72) 139/71 (01/16 1659) SpO2:  [95 %-99 %] 95 % (01/16 1659) Last BM Date : 07/21/23  Intake/Output from previous day: 01/15 0701 - 01/16 0700 In: -  Out: 230 [Drains:230] Intake/Output this shift: No intake/output data recorded.  General appearance: alert and cooperative Resp: no increased WOB Cardio: regular rate GI: Patient's JP drain has some brown clear fluid. mildly tender to palpation  Lab Results: Recent Labs    07/23/23 0602 07/25/23 1452  WBC 10.5 10.9*  HGB 12.7 12.6  HCT 39.8 39.3  PLT 361 364   BMET Recent Labs    07/23/23 0602 07/24/23 0846 07/24/23 1700 07/25/23 0551  NA 142 146* 139 139  K 3.9 4.3  --  3.4*  CL 105 107  --  101  CO2 27 27  --  28  GLUCOSE 133* 127*  --  125*  BUN <5* 7*  --  13  CREATININE 0.84 1.39*  --  1.02*  CALCIUM 8.4* 8.1*  --  8.1*   LFT Recent Labs    07/25/23 0946  PROT 5.7*  ALBUMIN 2.3*  AST 17  ALT 19  ALKPHOS 82  BILITOT 0.5  BILIDIR <0.1  IBILI NOT CALCULATED   PT/INR No results for input(s): "LABPROT", "INR" in the last 72 hours. Hepatitis Panel No results for input(s): "HEPBSAG", "HCVAB", "HEPAIGM", "HEPBIGM" in the last 72 hours. C-Diff No results for input(s): "CDIFFTOX" in the last 72 hours.  Studies/Results: DG Abd 2 Views Result Date: 07/25/2023 CLINICAL DATA:  Biliary stent insertion, bile leak. EXAM: ABDOMEN - 2 VIEW COMPARISON:  ERCP images from 07/23/2023 FINDINGS: The biliary stent remains in place. There is also a tube/drain projecting along the inferior margin of the right hepatic lobe and terminating in the porta  hepatis. I do not see the temporary ventral pancreatic duct stent in the expected position. I do see a small linear structure which may represent the original pancreatic duct stent projecting to the left of the spine at L4-5, possibly representing the stent transiting in the bowel. Blunting of the right costophrenic angle potentially from atelectasis or a trace pleural effusion. Thoracolumbar spondylosis. IMPRESSION: 1. The biliary stent remains in place. 2. The temporary ventral pancreatic duct stent is not seen in the expected position. I do see a small linear structure which may represent the original pancreatic duct stent projecting to the left of the spine at L4-5, possibly representing the stent transiting in the bowel. 3. Blunting of the right costophrenic angle potentially from atelectasis or a trace pleural effusion. Electronically Signed   By: Gaylyn Rong M.D.   On: 07/25/2023 18:20    Medications: I have reviewed the patient's current medications. Scheduled:  amitriptyline  10 mg Oral QHS   atorvastatin  10 mg Oral Daily   DULoxetine  20 mg Oral Daily   feeding supplement  237 mL Oral TID BM   gabapentin  300 mg Oral BID   insulin aspart  0-15 Units Subcutaneous TID WC   insulin aspart  0-5 Units Subcutaneous QHS   levothyroxine  25 mcg Oral Q0600   multivitamin with  minerals  1 tablet Oral Daily   pantoprazole  40 mg Oral Daily   senna-docusate  2 tablet Oral BID   sodium chloride flush  3 mL Intravenous Q12H   Continuous:  ampicillin-sulbactam (UNASYN) IV Stopped (07/25/23 1533)   ZOX:[id number], HYDROmorphone (DILAUDID) injection, ipratropium-albuterol, ondansetron (ZOFRAN) IV, mouth rinse, oxyCODONE, polyethylene glycol  Assessment/Plan: 73 year old female with history of OSA on CPAP, obesity, and HTN presented with acute cholecystitis. She had subtotal cholecystectomy performed on 1/9 and she developed a bile leak after the procedure.  HIDA scan confirmed this bile  leak.  Patient went for ERCP 1/14 with biliary sphincterotomy performed and a temporary plastic stent placed into the pancreatic duct as well as into the common bile duct.  Patient had some worsening abdominal pain today and increased drain output.  Her hemoglobin remained stable and lipase was normal today, suggesting against post ERCP pancreatitis.  KUB was ordered today and shows that the patient's biliary stent is in place.  Patient's pancreatic stent appears to have fallen out from the pancreatic duct already.  I discussed this case with my biliary colleague Dr. Meridee Score who stated that sometimes it takes several weeks before a bile leak is able to resolve even after stent placement.  He does state that there is the option of transitioning to another type of biliary stent such as a fully covered metal stent.  I offered the patient the option of getting a fully covered metal stent placed tomorrow via repeat ERCP versus continuing to see if the bile leak will resolve on its own with the plastic biliary stent in place.  Patient would prefer to wait to see if the bile leak will resolve on its own with the plastic biliary stent.  I have made her n.p.o. after midnight just in case her JP drain output increases again significantly. -Gastric biopsies from ERCP were negative for H. pylori -Continue to monitor drain output.  Patient will need an interval ERCP in 2 months to see if the CBD stent can be removed. If JP drain output were to dramatically increased over the next few days, then could consider a sooner ERCP procedure for transition to a covered metal stent.   -Avoid VTE prophylaxis for 48 hours after ERCP procedure   LOS: 7 days   Imogene Burn 07/25/2023, 7:19 PM

## 2023-07-25 NOTE — Consult Note (Signed)
[facility]    Date of Admission:  07/16/2023     Total days of antibiotics 10               Reason for Consult: Gallbladder abscess   Referring Provider: Dr. Caleb Popp Primary Care Provider: Olive Bass, FNP   ASSESSMENT:  Tricia Dodson is a 73 y/o caucasian female admitted with abdominal pain and found to have gangrenous cholecystitis and now s/p  laparoscopic subtotal cholecystectomy complicated by bile leak and s/p ERCP with stent placement. No cultures available to guide antibiotic therapy. Blood cultures likely contaminants given different species at different sites. Recommend to continue Unasyn until PO intake improves and then can switch to Augmentin 875/125 mg PO twice daily with goal of 3 weeks from the last surgery date 07/23/23 placing tentative end date of 08/13/23. Continue drain management per General Surgery. Remaining medical and supportive care per Internal Medicine.   PLAN:  Continue current dose of Unasyn until tolerating POs better then change to Augmentin 875/125 mg PO bid for 3 weeks with end date 08/13/23 [facility] management per General Surgery. Remaining medical and supportive care per Internal Medicine.    Principal Problem:   Acute cholecystitis Active Problems:   CPAP (continuous positive airway pressure) dependence   Hypertension   Cholecystitis, acute   AKI (acute kidney injury) (HCC)   Bile leak, postoperative   Gastritis and gastroduodenitis    amitriptyline  10 mg Oral QHS   atorvastatin  10 mg Oral Daily   DULoxetine  20 mg Oral Daily   feeding supplement  237 mL Oral TID BM   gabapentin  300 mg Oral BID   insulin aspart  0-15 Units Subcutaneous TID WC   insulin aspart  0-5 Units Subcutaneous QHS   levothyroxine  25 mcg Oral Q0600   multivitamin with minerals  1 tablet Oral Daily   pantoprazole  40 mg Oral Daily   senna-docusate  2 tablet Oral BID   sodium chloride flush  3 mL Intravenous Q12H     HPI:  Tricia Dodson is a 73 y.o. female with previous medical history of Type 2 diabetes, hypertension, obstructive sleep apnea, and severe obesity presenting to the ED for abdominal pain.   Tricia Dodson was initially seen by Internal Medicine on 12/26 with elevated d-dimer. Presented with 1 week history of lower abdominal pain and recent worsening right upper quadrant pain. No nausea, vomiting, diarrhea, or fever. Initially with elevated WBC count of 17,200. CT angio chest with no pulmonary embolism and mild hepatic steatosis. CT thoracic spine with no acute osseus abnormalities. CT abdomen/pelvis with gallbladder wall thickening suspicious for acute cholecystitis. US abdomen with cholelithiasis, sludge, and gallbladder wall thickening. MRI abdomen with stones in neck of the gallbladder. Brought to the OR on 07/17/22 for laparoscopic subtotal cholecystectomy and found to have gangrenous cholecystitis with purulence through the gallbladder. Unfortunately no cultures were obtained during the procedure. Brought back to the OR on 07/23/23 for ERCP and stent placement with bile duct leak.   Tricia Dodson initial blood cultures from 07/16/23 grew Streptococcus anginosis in one bottle and Staphylococcus capitus in the other likely representing contaminants. Blood cultures from 07/20/23 were finalized without growth. Received 4 days of piperacillin-tazobactam and is currently on Day 7 of ampicillin-sulbactam. Feeling okay POD #2 with some nausea following attempt to increase PO intake overnight. Tolerating antibiotics with no adverse side effects.   Review of Systems: Review of Systems  Constitutional:  Negative for chills, fever and weight loss.  Respiratory:  Negative for cough, shortness of breath and wheezing.   Cardiovascular:  Negative for chest pain and leg swelling.  Gastrointestinal:  Positive for diarrhea and nausea. Negative for abdominal pain, constipation and vomiting.  Skin:  Negative for rash.      Past Medical History:  Diagnosis Date   Allergy    Arthritis    Carpal tunnel syndrome, bilateral 11/04/2020   Both wrists.    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   Shingles 2019   Thyroid disease    Torn ligament 11/04/2020   Right knee   UTI (urinary tract infection)     Social History   Tobacco Use   Smoking status: Former   Smokeless tobacco: Never  Vaping Use   Vaping status: Never Used  Substance Use Topics   Alcohol use: Yes    Comment: occ    Family History  Problem Relation Age of Onset   Arthritis Mother    Diabetes Father    Heart disease Father    Arthritis Sister    Diabetes Sister    Hyperlipidemia Sister    Hyperlipidemia Brother    Heart disease Brother        triple heart bypass   Sleep apnea Brother     No Known Allergies  OBJECTIVE: Blood pressure (!) 148/72, pulse 84, temperature 98 F (36.7 C), temperature source Oral, resp. rate 18, height 5\' 7"  (1.702 m), weight 114.7 kg, SpO2 97%.  Physical Exam Constitutional:      General: She is not in acute distress.    Appearance: She is well-developed.  Cardiovascular:     Rate and Rhythm: Normal rate and regular rhythm.     Heart sounds: Normal heart sounds.  Pulmonary:     Effort: Pulmonary effort is normal.     Breath sounds: Normal breath sounds.  Abdominal:     General: Bowel sounds are normal.     Comments: JP drain in place with clear drainage.   Skin:    General: Skin is warm and dry.  Neurological:     Mental Status: She is  alert.  Psychiatric:        Mood and Affect: Mood normal.     Lab Results Lab Results  Component Value Date   WBC 10.5 07/23/2023   HGB 12.7 07/23/2023   HCT 39.8 07/23/2023   MCV 86.0 07/23/2023   PLT 361 07/23/2023    Lab Results  Component Value Date   CREATININE 1.02 (H) 07/25/2023   BUN 13 07/25/2023   NA 139 07/25/2023   K 3.4 (L) 07/25/2023   CL 101 07/25/2023   CO2 28 07/25/2023    Lab Results  Component Value Date   ALT 19 07/25/2023   AST 17 07/25/2023   ALKPHOS 82 07/25/2023   BILITOT 0.5 07/25/2023     Microbiology: Recent Results (from the past 240 hours)  Blood culture (routine x 2)  Status: Abnormal   Collection Time: 07/16/23  8:14 PM   Specimen: Right Antecubital; Blood  Result Value Ref Range Status   Specimen Description   Final    RIGHT ANTECUBITAL BLOOD Performed at [facility], [address] [place], [place] [address]    Special Requests   Final    BOTTLES DRAWN AEROBIC AND ANAEROBIC Blood Culture adequate volume Performed at [facility], [address] [place], [place] [address]    Culture  Setup Time   Final    GRAM POSITIVE COCCI AEROBIC BOTTLE ONLY CRITICAL RESULT CALLED TO, READ BACK BY AND VERIFIED WITH: PHARMD J LEDFORD 07/18/2023 @ 0151 BY AB Performed at [facility], [address], [place], [place] [address]    Culture STREPTOCOCCUS ANGINOSIS (A)  Final   Report Status 07/20/2023 FINAL  Final   Organism ID, Bacteria STREPTOCOCCUS ANGINOSIS  Final      Susceptibility   Streptococcus anginosis - MIC*    PENICILLIN <=0.06 SENSITIVE Sensitive     CEFTRIAXONE <=0.12 SENSITIVE Sensitive     ERYTHROMYCIN <=0.12 SENSITIVE Sensitive     LEVOFLOXACIN 0.5 SENSITIVE Sensitive     VANCOMYCIN 0.5 SENSITIVE Sensitive     * STREPTOCOCCUS ANGINOSIS  Blood culture (routine x 2)     Status: Abnormal   Collection Time: 07/16/23  8:14 PM   Specimen: Left Antecubital; Blood  Result Value Ref Range Status    Specimen Description   Final    LEFT ANTECUBITAL Performed at [facility], [address] [place], [place] [address]    Special Requests   Final    BOTTLES DRAWN AEROBIC AND ANAEROBIC Blood Culture adequate volume Performed at [facility], [address] [place], [place] [address]    Culture  Setup Time   Final    GRAM POSITIVE COCCI AEROBIC BOTTLE ONLY CRITICAL RESULT CALLED TO, READ BACK BY AND VERIFIED WITH: PHARMD ANDREW MEYER ON 07/18/23 @ 2021 BY DRT    Culture (A)  Final    STAPHYLOCOCCUS CAPITIS THE SIGNIFICANCE OF ISOLATING THIS ORGANISM FROM A SINGLE SET OF BLOOD CULTURES WHEN MULTIPLE SETS ARE DRAWN IS UNCERTAIN. PLEASE NOTIFY THE MICROBIOLOGY DEPARTMENT WITHIN ONE WEEK IF SPECIATION AND SENSITIVITIES ARE REQUIRED. Performed at [facility], [address] [place], [place] [address]    Report Status 07/22/2023 FINAL  Final  Blood Culture ID Panel (Reflexed)     Status: Abnormal   Collection Time: 07/16/23  8:14 PM  Result Value Ref Range Status   Enterococcus faecalis NOT DETECTED NOT DETECTED Final   Enterococcus Faecium NOT DETECTED NOT DETECTED Final   Listeria monocytogenes NOT DETECTED NOT DETECTED Final   Staphylococcus species NOT DETECTED NOT DETECTED Final   Staphylococcus aureus (BCID) NOT DETECTED NOT DETECTED Final   Staphylococcus epidermidis NOT DETECTED NOT DETECTED Final   Staphylococcus lugdunensis NOT DETECTED NOT DETECTED Final   Streptococcus species DETECTED (A) NOT DETECTED Final    Comment: Not Enterococcus species, Streptococcus agalactiae, Streptococcus pyogenes, or Streptococcus pneumoniae. CRITICAL RESULT CALLED TO, READ BACK BY AND VERIFIED WITH: PHARMD J LEDFORD 07/18/2023 @ 0151 BY AB    Streptococcus agalactiae NOT DETECTED NOT DETECTED Final   Streptococcus pneumoniae NOT DETECTED NOT DETECTED Final   Streptococcus pyogenes NOT DETECTED NOT DETECTED Final   A.calcoaceticus-baumannii NOT DETECTED NOT DETECTED  Final   Bacteroides fragilis NOT DETECTED NOT DETECTED Final   Enterobacterales NOT DETECTED NOT DETECTED Final  Enterobacter cloacae complex NOT DETECTED NOT DETECTED Final   Escherichia coli NOT DETECTED NOT DETECTED Final   Klebsiella aerogenes NOT DETECTED NOT DETECTED Final   Klebsiella oxytoca NOT DETECTED NOT DETECTED Final   Klebsiella pneumoniae NOT DETECTED NOT DETECTED Final   Proteus species NOT DETECTED NOT DETECTED Final   Salmonella species NOT DETECTED NOT DETECTED Final   Serratia marcescens NOT DETECTED NOT DETECTED Final   Haemophilus influenzae NOT DETECTED NOT DETECTED Final   Neisseria meningitidis NOT DETECTED NOT DETECTED Final   Pseudomonas aeruginosa NOT DETECTED NOT DETECTED Final   Stenotrophomonas maltophilia NOT DETECTED NOT DETECTED Final   Candida albicans NOT DETECTED NOT DETECTED Final   Candida auris NOT DETECTED NOT DETECTED Final   Candida glabrata NOT DETECTED NOT DETECTED Final   Candida krusei NOT DETECTED NOT DETECTED Final   Candida parapsilosis NOT DETECTED NOT DETECTED Final   Candida tropicalis NOT DETECTED NOT DETECTED Final   Cryptococcus neoformans/gattii NOT DETECTED NOT DETECTED Final    Comment: Performed at [facility], [address], [place], [place] [address]  Surgical pcr screen     Status: Abnormal   Collection Time: 07/18/23  3:44 AM   Specimen: Nasal Mucosa; Nasal Swab  Result Value Ref Range Status   MRSA, PCR NEGATIVE NEGATIVE Final   Staphylococcus aureus POSITIVE (A) NEGATIVE Final    Comment: (NOTE) The Xpert SA Assay (FDA approved for NASAL specimens in patients 16 years of age and older), is one component of a comprehensive surveillance program. It is not intended to diagnose infection nor to guide or monitor treatment. Performed at [facility], [address], [place], [place] [address]   Blood Culture ID Panel (Reflexed)     Status: Abnormal   Collection Time: 07/18/23  8:14 PM  Result Value Ref  Range Status   Enterococcus faecalis NOT DETECTED NOT DETECTED Final   Enterococcus Faecium NOT DETECTED NOT DETECTED Final   Listeria monocytogenes NOT DETECTED NOT DETECTED Final   Staphylococcus species DETECTED (A) NOT DETECTED Final    Comment: CRITICAL RESULT CALLED TO, READ BACK BY AND VERIFIED WITH: PHARMD ANDREW MEYER ON 07/18/23 @ 2038 BY DRT    Staphylococcus aureus (BCID) NOT DETECTED NOT DETECTED Final   Staphylococcus epidermidis NOT DETECTED NOT DETECTED Final   Staphylococcus lugdunensis NOT DETECTED NOT DETECTED Final   Streptococcus species NOT DETECTED NOT DETECTED Final   Streptococcus agalactiae NOT DETECTED NOT DETECTED Corrected    Comment: CORRECTED ON 01/10 AT 0815: PREVIOUSLY REPORTED AS NOT DETECTED CRITICAL RESULT CALLED TO, READ BACK BY AND VERIFIED WITH: PHARMD ANDREW MEYER ON 07/18/23 @ 2038 BY DRT   Streptococcus pneumoniae NOT DETECTED NOT DETECTED Final   Streptococcus pyogenes NOT DETECTED NOT DETECTED Final   A.calcoaceticus-baumannii NOT DETECTED NOT DETECTED Final   Bacteroides fragilis NOT DETECTED NOT DETECTED Final   Enterobacterales NOT DETECTED NOT DETECTED Final   Enterobacter cloacae complex NOT DETECTED NOT DETECTED Final   Escherichia coli NOT DETECTED NOT DETECTED Final   Klebsiella aerogenes NOT DETECTED NOT DETECTED Final   Klebsiella oxytoca NOT DETECTED NOT DETECTED Final   Klebsiella pneumoniae NOT DETECTED NOT DETECTED Final   Proteus species NOT DETECTED NOT DETECTED Final   Salmonella species NOT DETECTED NOT DETECTED Final   Serratia marcescens NOT DETECTED NOT DETECTED Final   Haemophilus influenzae NOT DETECTED NOT DETECTED Final   Neisseria meningitidis NOT DETECTED NOT DETECTED Final   Pseudomonas aeruginosa NOT DETECTED NOT DETECTED Final  Stenotrophomonas maltophilia NOT DETECTED NOT DETECTED Final   Candida albicans NOT DETECTED NOT DETECTED Final   Candida auris NOT DETECTED NOT DETECTED Final   Candida glabrata NOT  DETECTED NOT DETECTED Final   Candida krusei NOT DETECTED NOT DETECTED Final   Candida parapsilosis NOT DETECTED NOT DETECTED Final   Candida tropicalis NOT DETECTED NOT DETECTED Final   Cryptococcus neoformans/gattii NOT DETECTED NOT DETECTED Final    Comment: Performed at [facility], [address] [place], [place] [address]  Culture, blood (Routine X 2) w Reflex to ID Panel     Status: None   Collection Time: 07/20/23  4:56 AM   Specimen: BLOOD  Result Value Ref Range Status   Specimen Description BLOOD BLOOD RIGHT HAND  Final   Special Requests   Final    BOTTLES DRAWN AEROBIC AND ANAEROBIC Blood Culture results may not be optimal due to an inadequate volume of blood received in culture bottles   Culture   Final    NO GROWTH 5 DAYS Performed at [facility], [address], [place], [place] [address]    Report Status 07/25/2023 FINAL  Final  Culture, blood (Routine X 2) w Reflex to ID Panel     Status: None   Collection Time: 07/20/23  4:57 AM   Specimen: BLOOD  Result Value Ref Range Status   Specimen Description BLOOD BLOOD RIGHT HAND  Final   Special Requests   Final    BOTTLES DRAWN AEROBIC AND ANAEROBIC Blood Culture results may not be optimal due to an inadequate volume of blood received in culture bottles   Culture   Final    NO GROWTH 5 DAYS Performed at [facility], [address] [place], [place] [address]    Report Status 07/25/2023 FINAL  Final     Marcos Eke, NP [facility]  07/25/2023  2:03 PM

## 2023-07-25 NOTE — Progress Notes (Signed)
PROGRESS NOTE    [name]  UJW:[id number] DOB: [birth year] DOA: 07/16/2023 PCP: Olive Bass, FNP   Brief Narrative: Tricia Dodson is a 73 y.o. female with a history of sleep apnea on CPAP, obesity.  Patient presented secondary to right upper quadrant and back pain and was found to have evidence of acute cholecystitis.  General surgery was consulted.  Patient started empirically on IV antibiotics.  General surgery performed a subtotal cholecystectomy.  Hospitalization complicated by development of bile leak requiring ERCP with sphincterotomy, common bile duct and distal pancreatic duct stent placement..   Assessment and Plan:  Acute cholecystitis Patient seen and evaluated by general surgery.  Patient underwent subtotal cholecystectomy by general surgery; drain placed.  Complicated by development of bile leak.  Bile leak Complication of cholecystectomy.  Gastroenterology was consulted and performed an ERCP on 1/14 which confirmed a bile leak; biliary stent durotomy was performed in addition to sweeping of sludge from biliary tree.  Plastic stent placed into the common bile duct and temporary plastic placed into the distal pancreatic duct.  GI recommendations for KUB 2 view in 10 to 14 days to ensure pancreatic stent has migrated successfully; if not migrated, patient will need repeat EGD for stent pull. Patient's JP drain output became serosanguinous after CBD stent, however it has returned bilious. Lipase normal. Hepatic function testing is normal. -Continue Unasyn IV -GI recommendations  Erythematous stomach mucosa Noted during ERCP.  Biopsy performed for H. pylori assessment.  AKI Initial resolution, now worsened likely secondary to poor oral intake from NPO status. Creatinine peak of 1.39. Resolved.  Strep anginosus bacteremia Presumed sources intra-abdominal from cholecystitis.  Patient managed on Zosyn IV and transition to Unasyn IV.  Echo obtained on 07/19/23  and was significant for no evidence of valvular lesions.  Repeat blood cultures (1/11) and negative for ongoing growth -Continue Unasyn IV -ID recommendations prior to discharge  Primary hypertension Patient is managed on losartan as an outpatient, which was held on admission.  Blood pressure has been normotensive to slightly hypertensive this admission.  Obstructive sleep apnea -Continue CPAP  Hypothyroidism -Continue levothyroxine  Hypokalemia Patient given potassium supplementation.  Resolved.  Hypernatremia Mild.  Likely secondary to poor oral intake. Resolved.  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 -Continue gabapentin  Obesity, class II Estimated body mass index is 39.59 kg/m as calculated from the following:   Height as of this encounter: 5\' 7"  (1.702 m).   Weight as of this encounter: 114.7 kg.   DVT prophylaxis: SCDs Code Status:   Code Status: Full Code Family Communication: None at bedside Disposition Plan: Discharge home likely in 1-2 days pending GI and general surgery recommendations   Consultants:  General surgery  Gastroenterology  Procedures:  Laparoscopic subtotal cholecystectomy ERCP  Antimicrobials: Zosyn IV Unasyn IV   Subjective: Worsened abdominal pain overnight. Some mild improvement this morning after a quick nap. Pain is mostly epigastric. Patient thinks her pain worsened after advancing her diet yesterday.  Objective: BP (!) 148/72 (BP Location: Right Arm)   Pulse 84   Temp 98 F (36.7 C) (Oral)   Resp 18   Ht 5\' 7"  (1.702 m)   Wt 114.7 kg   SpO2 97%   BMI 39.59 kg/m   Examination:  General exam: Appears calm and comfortable Respiratory system: Clear to auscultation. Respiratory effort normal. Cardiovascular system: S1 & S2 heard, RRR. Gastrointestinal system: Abdomen is nondistended, soft and nontender. Normal bowel  sounds heard. JP drain with bilious  output Central nervous system: Alert and oriented. No focal neurological deficits. Musculoskeletal: No edema. No calf tenderness Psychiatry: Judgement and insight appear normal. Mood & affect appropriate.    Data Reviewed: I have personally reviewed following labs and imaging studies  CBC Lab Results  Component Value Date   WBC 10.5 07/23/2023   RBC 4.63 07/23/2023   HGB 12.7 07/23/2023   HCT 39.8 07/23/2023   MCV 86.0 07/23/2023   MCH 27.4 07/23/2023   PLT 361 07/23/2023   MCHC 31.9 07/23/2023   RDW 13.0 07/23/2023   LYMPHSABS 2.0 07/16/2023   MONOABS 1.2 (H) 07/16/2023   EOSABS 0.3 07/16/2023   BASOSABS 0.1 07/16/2023     Last metabolic panel Lab Results  Component Value Date   NA 139 07/25/2023   K 3.4 (L) 07/25/2023   CL 101 07/25/2023   CO2 28 07/25/2023   BUN 13 07/25/2023   CREATININE 1.02 (H) 07/25/2023   GLUCOSE 125 (H) 07/25/2023   GFRNONAA 58 (L) 07/25/2023   CALCIUM 8.1 (L) 07/25/2023   PROT 5.7 (L) 07/25/2023   ALBUMIN 2.3 (L) 07/25/2023   BILITOT 0.5 07/25/2023   ALKPHOS 82 07/25/2023   AST 17 07/25/2023   ALT 19 07/25/2023   ANIONGAP 10 07/25/2023    GFR: Estimated Creatinine Clearance: 65.2 mL/min (A) (by C-G formula based on SCr of 1.02 mg/dL (H)).  Recent Results (from the past 240 hours)  Blood culture (routine x 2)     Status: Abnormal   Collection Time: 07/16/23  8:14 PM   Specimen: Right Antecubital; Blood  Result Value Ref Range Status   Specimen Description   Final    RIGHT ANTECUBITAL BLOOD Performed at [facility], [address], [place], [place] [address]    Special Requests   Final    BOTTLES DRAWN AEROBIC AND ANAEROBIC Blood Culture adequate volume Performed at [facility], [address] [place], [place] [address]    Culture  Setup Time   Final    GRAM POSITIVE COCCI AEROBIC BOTTLE ONLY CRITICAL RESULT CALLED TO, READ BACK BY AND VERIFIED WITH: PHARMD J LEDFORD 07/18/2023 @ 0151 BY AB Performed at  [facility], [address], [place], [place] [address]    Culture STREPTOCOCCUS ANGINOSIS (A)  Final   Report Status 07/20/2023 FINAL  Final   Organism ID, Bacteria STREPTOCOCCUS ANGINOSIS  Final      Susceptibility   Streptococcus anginosis - MIC*    PENICILLIN <=0.06 SENSITIVE Sensitive     CEFTRIAXONE <=0.12 SENSITIVE Sensitive     ERYTHROMYCIN <=0.12 SENSITIVE Sensitive     LEVOFLOXACIN 0.5 SENSITIVE Sensitive     VANCOMYCIN 0.5 SENSITIVE Sensitive     * STREPTOCOCCUS ANGINOSIS  Blood culture (routine x 2)     Status: Abnormal   Collection Time: 07/16/23  8:14 PM   Specimen: Left Antecubital; Blood  Result Value Ref Range Status   Specimen Description   Final    LEFT ANTECUBITAL Performed at [facility], [address] [place], [place] [address]    Special Requests   Final    BOTTLES DRAWN AEROBIC AND ANAEROBIC Blood Culture adequate volume Performed at [facility], [address] [place], [place] [address]    Culture  Setup Time   Final    GRAM POSITIVE COCCI AEROBIC BOTTLE ONLY CRITICAL RESULT CALLED TO, READ BACK BY AND VERIFIED WITH: PHARMD ANDREW  MEYER ON 07/18/23 @ 2021 BY DRT    Culture (A)  Final    STAPHYLOCOCCUS CAPITIS THE SIGNIFICANCE OF ISOLATING THIS ORGANISM FROM A SINGLE SET OF BLOOD CULTURES WHEN MULTIPLE SETS ARE DRAWN IS UNCERTAIN. PLEASE NOTIFY THE MICROBIOLOGY DEPARTMENT WITHIN ONE WEEK IF SPECIATION AND SENSITIVITIES ARE REQUIRED. Performed at [facility], [address] [place], [place] [address]    Report Status 07/22/2023 FINAL  Final  Blood Culture ID Panel (Reflexed)     Status: Abnormal   Collection Time: 07/16/23  8:14 PM  Result Value Ref Range Status   Enterococcus faecalis NOT DETECTED NOT DETECTED Final   Enterococcus Faecium NOT DETECTED NOT DETECTED Final   Listeria monocytogenes NOT DETECTED NOT DETECTED Final   Staphylococcus species NOT DETECTED NOT DETECTED Final   Staphylococcus aureus  (BCID) NOT DETECTED NOT DETECTED Final   Staphylococcus epidermidis NOT DETECTED NOT DETECTED Final   Staphylococcus lugdunensis NOT DETECTED NOT DETECTED Final   Streptococcus species DETECTED (A) NOT DETECTED Final    Comment: Not Enterococcus species, Streptococcus agalactiae, Streptococcus pyogenes, or Streptococcus pneumoniae. CRITICAL RESULT CALLED TO, READ BACK BY AND VERIFIED WITH: PHARMD J LEDFORD 07/18/2023 @ 0151 BY AB    Streptococcus agalactiae NOT DETECTED NOT DETECTED Final   Streptococcus pneumoniae NOT DETECTED NOT DETECTED Final   Streptococcus pyogenes NOT DETECTED NOT DETECTED Final   A.calcoaceticus-baumannii NOT DETECTED NOT DETECTED Final   Bacteroides fragilis NOT DETECTED NOT DETECTED Final   Enterobacterales NOT DETECTED NOT DETECTED Final   Enterobacter cloacae complex NOT DETECTED NOT DETECTED Final   Escherichia coli NOT DETECTED NOT DETECTED Final   Klebsiella aerogenes NOT DETECTED NOT DETECTED Final   Klebsiella oxytoca NOT DETECTED NOT DETECTED Final   Klebsiella pneumoniae NOT DETECTED NOT DETECTED Final   Proteus species NOT DETECTED NOT DETECTED Final   Salmonella species NOT DETECTED NOT DETECTED Final   Serratia marcescens NOT DETECTED NOT DETECTED Final   Haemophilus influenzae NOT DETECTED NOT DETECTED Final   Neisseria meningitidis NOT DETECTED NOT DETECTED Final   Pseudomonas aeruginosa NOT DETECTED NOT DETECTED Final   Stenotrophomonas maltophilia NOT DETECTED NOT DETECTED Final   Candida albicans NOT DETECTED NOT DETECTED Final   Candida auris NOT DETECTED NOT DETECTED Final   Candida glabrata NOT DETECTED NOT DETECTED Final   Candida krusei NOT DETECTED NOT DETECTED Final   Candida parapsilosis NOT DETECTED NOT DETECTED Final   Candida tropicalis NOT DETECTED NOT DETECTED Final   Cryptococcus neoformans/gattii NOT DETECTED NOT DETECTED Final    Comment: Performed at [facility], [address], [place], [place] [address]  Surgical  pcr screen     Status: Abnormal   Collection Time: 07/18/23  3:44 AM   Specimen: Nasal Mucosa; Nasal Swab  Result Value Ref Range Status   MRSA, PCR NEGATIVE NEGATIVE Final   Staphylococcus aureus POSITIVE (A) NEGATIVE Final    Comment: (NOTE) The Xpert SA Assay (FDA approved for NASAL specimens in patients 58 years of age and older), is one component of a comprehensive surveillance program. It is not intended to diagnose infection nor to guide or monitor treatment. Performed at [facility], [address], [place], [place] [address]   Blood Culture ID Panel (Reflexed)     Status: Abnormal   Collection Time: 07/18/23  8:14 PM  Result Value Ref Range Status   Enterococcus faecalis NOT DETECTED NOT DETECTED Final   Enterococcus Faecium NOT DETECTED NOT DETECTED Final   Listeria  monocytogenes NOT DETECTED NOT DETECTED Final   Staphylococcus species DETECTED (A) NOT DETECTED Final    Comment: CRITICAL RESULT CALLED TO, READ BACK BY AND VERIFIED WITH: PHARMD ANDREW MEYER ON 07/18/23 @ 2038 BY DRT    Staphylococcus aureus (BCID) NOT DETECTED NOT DETECTED Final   Staphylococcus epidermidis NOT DETECTED NOT DETECTED Final   Staphylococcus lugdunensis NOT DETECTED NOT DETECTED Final   Streptococcus species NOT DETECTED NOT DETECTED Final   Streptococcus agalactiae NOT DETECTED NOT DETECTED Corrected    Comment: CORRECTED ON 01/10 AT 0815: PREVIOUSLY REPORTED AS NOT DETECTED CRITICAL RESULT CALLED TO, READ BACK BY AND VERIFIED WITH: PHARMD ANDREW MEYER ON 07/18/23 @ 2038 BY DRT   Streptococcus pneumoniae NOT DETECTED NOT DETECTED Final   Streptococcus pyogenes NOT DETECTED NOT DETECTED Final   A.calcoaceticus-baumannii NOT DETECTED NOT DETECTED Final   Bacteroides fragilis NOT DETECTED NOT DETECTED Final   Enterobacterales NOT DETECTED NOT DETECTED Final   Enterobacter cloacae complex NOT DETECTED NOT DETECTED Final   Escherichia coli NOT DETECTED NOT DETECTED Final   Klebsiella  aerogenes NOT DETECTED NOT DETECTED Final   Klebsiella oxytoca NOT DETECTED NOT DETECTED Final   Klebsiella pneumoniae NOT DETECTED NOT DETECTED Final   Proteus species NOT DETECTED NOT DETECTED Final   Salmonella species NOT DETECTED NOT DETECTED Final   Serratia marcescens NOT DETECTED NOT DETECTED Final   Haemophilus influenzae NOT DETECTED NOT DETECTED Final   Neisseria meningitidis NOT DETECTED NOT DETECTED Final   Pseudomonas aeruginosa NOT DETECTED NOT DETECTED Final   Stenotrophomonas maltophilia NOT DETECTED NOT DETECTED Final   Candida albicans NOT DETECTED NOT DETECTED Final   Candida auris NOT DETECTED NOT DETECTED Final   Candida glabrata NOT DETECTED NOT DETECTED Final   Candida krusei NOT DETECTED NOT DETECTED Final   Candida parapsilosis NOT DETECTED NOT DETECTED Final   Candida tropicalis NOT DETECTED NOT DETECTED Final   Cryptococcus neoformans/gattii NOT DETECTED NOT DETECTED Final    Comment: Performed at [facility], [address] [place], [place] [address]  Culture, blood (Routine X 2) w Reflex to ID Panel     Status: None   Collection Time: 07/20/23  4:56 AM   Specimen: BLOOD  Result Value Ref Range Status   Specimen Description BLOOD BLOOD RIGHT HAND  Final   Special Requests   Final    BOTTLES DRAWN AEROBIC AND ANAEROBIC Blood Culture results may not be optimal due to an inadequate volume of blood received in culture bottles   Culture   Final    NO GROWTH 5 DAYS Performed at [facility], [address] [place], [place] [address]    Report Status 07/25/2023 FINAL  Final  Culture, blood (Routine X 2) w Reflex to ID Panel     Status: None   Collection Time: 07/20/23  4:57 AM   Specimen: BLOOD  Result Value Ref Range Status   Specimen Description BLOOD BLOOD RIGHT HAND  Final   Special Requests   Final    BOTTLES DRAWN AEROBIC AND ANAEROBIC Blood Culture results may not be optimal due to an inadequate volume of blood received in culture  bottles   Culture   Final    NO GROWTH 5 DAYS Performed at [facility], [address], [place], [place] [address]    Report Status 07/25/2023 FINAL  Final      Radiology Studies: DG ERCP Result Date: 07/24/2023 CLINICAL DATA:  Possible choledocholithiasis EXAM: ERCP TECHNIQUE: Multiple spot  images obtained with the fluoroscopic device and submitted for interpretation post-procedure. FLUOROSCOPY: Radiation Exposure Index (as provided by the fluoroscopic device): 69.8 mGy Kerma COMPARISON:  None Available. FINDINGS: Seven intraoperative saved images are submitted for review. The images demonstrate a flexible duodenal scope in the descending duodenum followed by wire cannulation of the pancreatic and common bile ducts. Subsequent images demonstrate placement of a plastic biliary stent. Cholecystectomy clips are present suggesting prior cholecystectomy. IMPRESSION: ERCP with placement of a plastic biliary stent. These images were submitted for radiologic interpretation only. Please see the procedural report for the amount of contrast and the fluoroscopy time utilized. Electronically Signed   By: Malachy Moan M.D.   On: 07/24/2023 08:00   DG C-Arm 1-60 Min-No Report Result Date: 07/23/2023 Fluoroscopy was utilized by the requesting physician.  No radiographic interpretation.      LOS: 7 days    Jacquelin Hawking, MD [facility] 07/25/2023, 12:07 PM   If 7PM-7AM, please contact night-coverage [email or web address]

## 2023-07-25 NOTE — Progress Notes (Signed)
[facility] Progress Note  2 Days Post-Op  Subjective: Had solid diet yesterday with acute worsening of nausea and abdominal pain overnight. Pain is greatest in epigastrium. She is also having worsening reflux from baseline. No emesis. She has been OOB ambulating  Objective: Vital signs in last 24 hours: Temp:  [98 F (36.7 C)-98.8 F (37.1 C)] 98 F (36.7 C) (01/16 0513) Pulse Rate:  [68-90] 68 (01/16 0513) Resp:  [16] 16 (01/15 1555) BP: (134-139)/(64-68) 139/68 (01/16 0513) SpO2:  [99 %] 99 % (01/16 0513) Weight:  [114.7 kg] 114.7 kg (01/15 1214) Last BM Date : 07/21/23  Intake/Output from previous day: 01/15 0701 - 01/16 0700 In: -  Out: 230 [Drains:230] Intake/Output this shift: Total I/O In: 3 [I.V.:3] Out: -   PE: Gen:  Alert, NAD, pleasant Card:  Regular rate and rhythm Pulm:  non-labored Abd: Soft. Moderate TTP in epigastrium and minimal TTP RLQ around drain.  Incisions are healing appropriately.  There is no erythema.  JP drain is bilious  Skin: warm and dry, no rashes  Psych: A&Ox3   Lab Results:  Recent Labs    07/23/23 0602  WBC 10.5  HGB 12.7  HCT 39.8  PLT 361   BMET Recent Labs    07/24/23 0846 07/24/23 1700 07/25/23 0551  NA 146* 139 139  K 4.3  --  3.4*  CL 107  --  101  CO2 27  --  28  GLUCOSE 127*  --  125*  BUN 7*  --  13  CREATININE 1.39*  --  1.02*  CALCIUM 8.1*  --  8.1*   PT/INR No results for input(s): "LABPROT", "INR" in the last 72 hours.  CMP     Component Value Date/Time   NA 139 07/25/2023 0551   K 3.4 (L) 07/25/2023 0551   CL 101 07/25/2023 0551   CO2 28 07/25/2023 0551   GLUCOSE 125 (H) 07/25/2023 0551   BUN 13 07/25/2023 0551   CREATININE 1.02 (H) 07/25/2023 0551   CALCIUM 8.1 (L) 07/25/2023 0551   PROT 5.8 (L) 07/24/2023 0846   ALBUMIN 2.2 (L) 07/24/2023 0846   AST 18 07/24/2023 0846   ALT 21 07/24/2023 0846   ALKPHOS 96 07/24/2023 0846   BILITOT 0.5 07/24/2023 0846   GFRNONAA 58 (L)  07/25/2023 0551   Lipase     Component Value Date/Time   LIPASE 22 07/16/2023 1522       Studies/Results: DG ERCP Result Date: 07/24/2023 CLINICAL DATA:  Possible choledocholithiasis EXAM: ERCP TECHNIQUE: Multiple spot images obtained with the fluoroscopic device and submitted for interpretation post-procedure. FLUOROSCOPY: Radiation Exposure Index (as provided by the fluoroscopic device): 69.8 mGy Kerma COMPARISON:  None Available. FINDINGS: Seven intraoperative saved images are submitted for review. The images demonstrate a flexible duodenal scope in the descending duodenum followed by wire cannulation of the pancreatic and common bile ducts. Subsequent images demonstrate placement of a plastic biliary stent. Cholecystectomy clips are present suggesting prior cholecystectomy. IMPRESSION: ERCP with placement of a plastic biliary stent. These images were submitted for radiologic interpretation only. Please see the procedural report for the amount of contrast and the fluoroscopy time utilized. Electronically Signed   By: Malachy Moan M.D.   On: 07/24/2023 08:00   DG C-Arm 1-60 Min-No Report Result Date: 07/23/2023 Fluoroscopy was utilized by the requesting physician.  No radiographic interpretation.     Anti-infectives: Anti-infectives (From admission, onward)    Start     Dose/Rate Route Frequency Ordered  Stop   07/19/23 1800  Ampicillin-Sulbactam (UNASYN) 3 g in sodium chloride 0.9 % 100 mL IVPB        3 g 200 mL/hr over 30 Minutes Intravenous Every 6 hours 07/19/23 1335     07/18/23 0830  ceFAZolin (ANCEF) 3 g in sodium chloride 0.9 % 100 mL IVPB  Status:  Discontinued        3 g 200 mL/hr over 30 Minutes Intravenous On call to O.R. 07/18/23 0818 07/18/23 1134   07/18/23 0814  sodium chloride 0.9 % with ceFAZolin (ANCEF) ADS Med       Note to Pharmacy: Garen Lah E: cabinet override      07/18/23 0814 07/18/23 2029   07/17/23 1430  ceFAZolin (ANCEF) 3 g in sodium chloride  0.9 % 100 mL IVPB  Status:  Discontinued        3 g 200 mL/hr over 30 Minutes Intravenous On call to O.R. 07/17/23 1352 07/17/23 1822   07/17/23 1403  sodium chloride 0.9 % with ceFAZolin (ANCEF) ADS Med  Status:  Discontinued       Note to Pharmacy: Launa Flight M: cabinet override      07/17/23 1403 07/17/23 1741   07/17/23 0800  piperacillin-tazobactam (ZOSYN) IVPB 3.375 g  Status:  Discontinued        3.375 g 12.5 mL/hr over 240 Minutes Intravenous Every 8 hours 07/17/23 0759 07/19/23 1335   07/16/23 1945  piperacillin-tazobactam (ZOSYN) IVPB 3.375 g        3.375 g 100 mL/hr over 30 Minutes Intravenous  Once 07/16/23 1934 07/16/23 2228        Assessment/Plan  Acute gangrenous cholecystitis POD#7 s/p subtotal cholecystectomy 1/9 Dr. Hillery Hunter   ERCP, difficult, 07/23/23 - +bile leak, successfully stented. Drain became SS immediately after but is now frank bile again  AFVSS, creatinine improved LFTs are pending and lipase pending. Will add WBC GI signed off and arranging follow up. JP to bulb suction - output 230 ml/24h  FEN: CLD awaiting labs ID: Unasyn VTE: SCDs; holding chemical dvt ppx for 48 hrs post ERCP (can restart this evening)   LOS: 7 days   Eric Form, [facility] 07/25/2023, 8:48 AM Please see Amion for pager number during day hours 7:00am-4:30pm

## 2023-07-26 DIAGNOSIS — K838 Other specified diseases of biliary tract: Secondary | ICD-10-CM | POA: Diagnosis not present

## 2023-07-26 DIAGNOSIS — K810 Acute cholecystitis: Secondary | ICD-10-CM | POA: Diagnosis not present

## 2023-07-26 DIAGNOSIS — R109 Unspecified abdominal pain: Secondary | ICD-10-CM | POA: Diagnosis not present

## 2023-07-26 DIAGNOSIS — K9189 Other postprocedural complications and disorders of digestive system: Secondary | ICD-10-CM | POA: Diagnosis not present

## 2023-07-26 LAB — COMPREHENSIVE METABOLIC PANEL
ALT: 17 U/L (ref 0–44)
AST: 16 U/L (ref 15–41)
Albumin: 2.5 g/dL — ABNORMAL LOW (ref 3.5–5.0)
Alkaline Phosphatase: 92 U/L (ref 38–126)
Anion gap: 9 (ref 5–15)
BUN: 7 mg/dL — ABNORMAL LOW (ref 8–23)
CO2: 26 mmol/L (ref 22–32)
Calcium: 8.2 mg/dL — ABNORMAL LOW (ref 8.9–10.3)
Chloride: 103 mmol/L (ref 98–111)
Creatinine, Ser: 0.96 mg/dL (ref 0.44–1.00)
GFR, Estimated: >60 mL/min (ref >60)
Glucose, Bld: 128 mg/dL — ABNORMAL HIGH (ref 70–99)
Potassium: 3.9 mmol/L (ref 3.5–5.1)
Sodium: 138 mmol/L (ref 135–145)
Total Bilirubin: 0.7 mg/dL (ref 0.0–1.2)
Total Protein: 6.2 g/dL — ABNORMAL LOW (ref 6.5–8.1)

## 2023-07-26 LAB — GLUCOSE, CAPILLARY
Glucose-Capillary: 135 mg/dL — ABNORMAL HIGH (ref 70–99)
Glucose-Capillary: 127 mg/dL — ABNORMAL HIGH (ref 70–99)
Glucose-Capillary: 134 mg/dL — ABNORMAL HIGH (ref 70–99)
Glucose-Capillary: 139 mg/dL — ABNORMAL HIGH (ref 70–99)

## 2023-07-26 LAB — CBC
HCT: 41.1 % (ref 36.0–46.0)
Hemoglobin: 13 g/dL (ref 12.0–15.0)
MCH: 27.5 pg (ref 26.0–34.0)
MCHC: 31.6 g/dL (ref 30.0–36.0)
MCV: 86.9 fL (ref 80.0–100.0)
Platelets: 381 10*3/uL (ref 150–400)
RBC: 4.73 MIL/uL (ref 3.87–5.11)
RDW: 13.2 % (ref 11.5–15.5)
WBC: 10.5 10*3/uL (ref 4.0–10.5)
nRBC: 0 % (ref 0.0–0.2)

## 2023-07-26 NOTE — Care Management Important Message (Signed)
Important Message  Patient Details  Name: Tricia Dodson MRN: [id number] Date of Birth: [birth year]   Important Message Given:  Yes - Medicare IM     Dorena Bodo 07/26/2023, 4:22 PM

## 2023-07-26 NOTE — Progress Notes (Signed)
Mobility Specialist Progress Note:   07/26/23 1247  Mobility  Activity Ambulated with assistance in hallway  Level of Assistance Modified independent, requires aide device or extra time  Assistive Device Front wheel walker  Distance Ambulated (ft) 200 ft  Activity Response Tolerated well  Mobility Referral Yes  Mobility visit 1 Mobility  Mobility Specialist Start Time (ACUTE ONLY) 1215  Mobility Specialist Stop Time (ACUTE ONLY) 1226  Mobility Specialist Time Calculation (min) (ACUTE ONLY) 11 min    Pt received EOB, agreeable to mobility. Pt c/o back pain during session, otherwise asx throughout. Pt left EOB with call bell in reach and all needs met.   Leory Plowman  Mobility Specialist Please contact via [employer] office at [phone]

## 2023-07-26 NOTE — Progress Notes (Addendum)
Daily Progress Note  DOA: 07/16/2023 Hospital Day: 11   Chief Complaint: post-op bile duct leak  ASSESSMENT    Brief Narrative:  Tricia Dodson is a 73 y.o. year old female with a history of  HTN, OSA on CPAP, obesity. Admitted with acute cholecystitis, s/p subtotal cholecystectomy 1/9. GI consulted 1/10 for bile leak   Acute gangrenous cholecystitis s/p subtotal cholecystectomy 1/9  Post-op bile duct leak s/p ERCP with sphincterotomy, removal of sludge from CBD, plastic stent placement to pancreatic duct and CBD.  Today:     LFTs normal. Decrease in drain output yesterday from 230 to 135 ml. . Currently drain has only a few ml of yellow fluid but patient tells me it was just emptied and was about 1/2 full. Nothing documented in I&0 so will ask her nurse.  Addendum: Just spoke with RN, 55 ml emptied within around 3 pm   Principal Problem:   Acute cholecystitis Active Problems:   CPAP (continuous positive airway pressure) dependence   Hypertension   Cholecystitis, acute   AKI (acute kidney injury) (HCC)   Bile leak, postoperative   Gastritis and gastroduodenitis   PLAN   --KUB yesterday ordered for abdominal pain suggests that the pancreatic duct stent may have already fallen out and biliary stent is in place.  --It could take several weeks for a bile leak to resolve even after stent placement. Yesterday we offered transitioning to another type of biliary stent such as a fully covered metal stent but she apparently wanted to hold off.    Subjective   She has mid upper abdominal discomfort , not severe.   Objective   ERCP - Erythematous mucosa in the stomach - biopsied for H. pylori assessment. - No gross lesions in the duodenal bulb, in the first portion of the duodenum and in the second portion of the duodenum. - The major papilla was located entirely within a diverticulum. When finally visualized, it appeared congested. - Two hemostatic clips were successfully  placed (MR conditional) in the area of the papilla in order to pull it out of the diverticulum with success. - Double wire technique as documented above required to enter the biliary tree. - A bile leak was found. - A biliary sphincterotomy was performed with mild oozing noted which stabilized after tamponade with a retrieval balloon to the sphincterotomy site (more than 10 minutes of continued procedure showed no evidence of persisting bleeding). - The biliary tree was swept and sludge was found. - One temporary plastic pancreatic stent was placed into the ventral pancreatic duct. - One plastic biliary stent was placed into the common bile duct.   Recent Labs    07/25/23 1452 07/26/23 0725  WBC 10.9* 10.5  HGB 12.6 13.0  HCT 39.3 41.1  PLT 364 381   BMET Recent Labs    07/24/23 0846 07/24/23 1700 07/25/23 0551 07/26/23 0725  NA 146* 139 139 138  K 4.3  --  3.4* 3.9  CL 107  --  101 103  CO2 27  --  28 26  GLUCOSE 127*  --  125* 128*  BUN 7*  --  13 7*  CREATININE 1.39*  --  1.02* 0.96  CALCIUM 8.1*  --  8.1* 8.2*   LFT Recent Labs    07/25/23 0946 07/26/23 0725  PROT 5.7* 6.2*  ALBUMIN 2.3* 2.5*  AST 17 16  ALT 19 17  ALKPHOS 82 92  BILITOT 0.5 0.7  BILIDIR <0.1  --  IBILI NOT CALCULATED  --    PT/INR No results for input(s): "LABPROT", "INR" in the last 72 hours.   Imaging:  DG Abd 2 Views CLINICAL DATA:  Biliary stent insertion, bile leak.  EXAM: ABDOMEN - 2 VIEW  COMPARISON:  ERCP images from 07/23/2023  FINDINGS: The biliary stent remains in place. There is also a tube/drain projecting along the inferior margin of the right hepatic lobe and terminating in the porta hepatis.  I do not see the temporary ventral pancreatic duct stent in the expected position. I do see a small linear structure which may represent the original pancreatic duct stent projecting to the left of the spine at L4-5, possibly representing the stent transiting in the  bowel.  Blunting of the right costophrenic angle potentially from atelectasis or a trace pleural effusion. Thoracolumbar spondylosis.  IMPRESSION: 1. The biliary stent remains in place. 2. The temporary ventral pancreatic duct stent is not seen in the expected position. I do see a small linear structure which may represent the original pancreatic duct stent projecting to the left of the spine at L4-5, possibly representing the stent transiting in the bowel. 3. Blunting of the right costophrenic angle potentially from atelectasis or a trace pleural effusion.  Electronically Signed   By: Gaylyn Rong M.D.   On: 07/25/2023 18:20     Scheduled inpatient medications:   amitriptyline  10 mg Oral QHS   atorvastatin  10 mg Oral Daily   DULoxetine  20 mg Oral Daily   feeding supplement  237 mL Oral TID BM   gabapentin  300 mg Oral BID   insulin aspart  0-15 Units Subcutaneous TID WC   insulin aspart  0-5 Units Subcutaneous QHS   levothyroxine  25 mcg Oral Q0600   multivitamin with minerals  1 tablet Oral Daily   pantoprazole  40 mg Oral Daily   senna-docusate  2 tablet Oral BID   sodium chloride flush  3 mL Intravenous Q12H   Continuous inpatient infusions:   ampicillin-sulbactam (UNASYN) IV 3 g (07/26/23 1450)   PRN inpatient medications: acetaminophen, HYDROmorphone (DILAUDID) injection, ipratropium-albuterol, melatonin, ondansetron (ZOFRAN) IV, mouth rinse, oxyCODONE, polyethylene glycol  Vital signs in last 24 hours: Temp:  [98 F (36.7 C)-98.6 F (37 C)] 98 F (36.7 C) (01/17 0349) Pulse Rate:  [79-92] 79 (01/17 0349) Resp:  [16-18] 16 (01/17 0349) BP: (136-141)/(71-80) 136/80 (01/17 0349) SpO2:  [94 %-97 %] 94 % (01/17 0349) Last BM Date : 07/25/23  Intake/Output Summary (Last 24 hours) at 07/26/2023 1513 Last data filed at 07/26/2023 0700 Gross per 24 hour  Intake --  Output 50 ml  Net -50 ml    Intake/Output from previous day: 01/16 0701 - 01/17  0700 In: 3 [I.V.:3] Out: 135 [Drains:135] Intake/Output this shift: No intake/output data recorded.   Physical Exam:  General: Alert female in NAD Heart:  Regular rate and rhythm.  Pulmonary: Normal respiratory effort Abdomen: Soft, nondistended, mild mid upper abdominal tenderness. . Normal bowel sounds.. A few ml of yellow fluid in drain bulb Neurologic: Alert and oriented Psych: Pleasant. Cooperative. Insight appears normal.     LOS: 8 days   Willette Cluster ,NP 07/26/2023, 3:13 PM

## 2023-07-26 NOTE — Progress Notes (Signed)
[facility] Progress Note  3 Days Post-Op  Subjective: Had solid diet yesterday with acute worsening of nausea and abdominal pain overnight. Pain is greatest in epigastrium. She is also having worsening reflux from baseline. No emesis. She has been OOB ambulating  Objective: Vital signs in last 24 hours: Temp:  [98 F (36.7 C)-98.6 F (37 C)] 98 F (36.7 C) (01/17 0349) Pulse Rate:  [79-92] 79 (01/17 0349) Resp:  [16-18] 16 (01/17 0349) BP: (136-148)/(71-80) 136/80 (01/17 0349) SpO2:  [94 %-97 %] 94 % (01/17 0349) Last BM Date : 07/25/23  Intake/Output from previous day: 01/16 0701 - 01/17 0700 In: 3 [I.V.:3] Out: 135 [Drains:135] Intake/Output this shift: No intake/output data recorded.  PE: Gen:  Alert, NAD, pleasant Card:  Regular rate and rhythm Pulm:  non-labored Abd: Soft. Moderate TTP in epigastrium and minimal TTP RLQ around drain.  Incisions are healing appropriately.  There is no erythema.  JP drain is bilious  Skin: warm and dry, no rashes  Psych: A&Ox3   Lab Results:  Recent Labs    07/25/23 1452 07/26/23 0725  WBC 10.9* 10.5  HGB 12.6 13.0  HCT 39.3 41.1  PLT 364 381   BMET Recent Labs    07/25/23 0551 07/26/23 0725  NA 139 138  K 3.4* 3.9  CL 101 103  CO2 28 26  GLUCOSE 125* 128*  BUN 13 7*  CREATININE 1.02* 0.96  CALCIUM 8.1* 8.2*   PT/INR No results for input(s): "LABPROT", "INR" in the last 72 hours.  CMP     Component Value Date/Time   NA 138 07/26/2023 0725   K 3.9 07/26/2023 0725   CL 103 07/26/2023 0725   CO2 26 07/26/2023 0725   GLUCOSE 128 (H) 07/26/2023 0725   BUN 7 (L) 07/26/2023 0725   CREATININE 0.96 07/26/2023 0725   CALCIUM 8.2 (L) 07/26/2023 0725   PROT 6.2 (L) 07/26/2023 0725   ALBUMIN 2.5 (L) 07/26/2023 0725   AST 16 07/26/2023 0725   ALT 17 07/26/2023 0725   ALKPHOS 92 07/26/2023 0725   BILITOT 0.7 07/26/2023 0725   GFRNONAA >60 07/26/2023 0725   Lipase     Component Value Date/Time    LIPASE 50 07/25/2023 0946       Studies/Results: DG Abd 2 Views Result Date: 07/25/2023 CLINICAL DATA:  Biliary stent insertion, bile leak. EXAM: ABDOMEN - 2 VIEW COMPARISON:  ERCP images from 07/23/2023 FINDINGS: The biliary stent remains in place. There is also a tube/drain projecting along the inferior margin of the right hepatic lobe and terminating in the porta hepatis. I do not see the temporary ventral pancreatic duct stent in the expected position. I do see a small linear structure which may represent the original pancreatic duct stent projecting to the left of the spine at L4-5, possibly representing the stent transiting in the bowel. Blunting of the right costophrenic angle potentially from atelectasis or a trace pleural effusion. Thoracolumbar spondylosis. IMPRESSION: 1. The biliary stent remains in place. 2. The temporary ventral pancreatic duct stent is not seen in the expected position. I do see a small linear structure which may represent the original pancreatic duct stent projecting to the left of the spine at L4-5, possibly representing the stent transiting in the bowel. 3. Blunting of the right costophrenic angle potentially from atelectasis or a trace pleural effusion. Electronically Signed   By: Gaylyn Rong M.D.   On: 07/25/2023 18:20     Anti-infectives: Anti-infectives (From admission, onward)  Start     Dose/Rate Route Frequency Ordered Stop   07/19/23 1800  Ampicillin-Sulbactam (UNASYN) 3 g in sodium chloride 0.9 % 100 mL IVPB        3 g 200 mL/hr over 30 Minutes Intravenous Every 6 hours 07/19/23 1335 08/13/23 2359   07/18/23 0830  ceFAZolin (ANCEF) 3 g in sodium chloride 0.9 % 100 mL IVPB  Status:  Discontinued        3 g 200 mL/hr over 30 Minutes Intravenous On call to O.R. 07/18/23 0818 07/18/23 1134   07/18/23 0814  sodium chloride 0.9 % with ceFAZolin (ANCEF) ADS Med       Note to Pharmacy: Garen Lah E: cabinet override      07/18/23 0814 07/18/23  2029   07/17/23 1430  ceFAZolin (ANCEF) 3 g in sodium chloride 0.9 % 100 mL IVPB  Status:  Discontinued        3 g 200 mL/hr over 30 Minutes Intravenous On call to O.R. 07/17/23 1352 07/17/23 1822   07/17/23 1403  sodium chloride 0.9 % with ceFAZolin (ANCEF) ADS Med  Status:  Discontinued       Note to Pharmacy: Launa Flight M: cabinet override      07/17/23 1403 07/17/23 1741   07/17/23 0800  piperacillin-tazobactam (ZOSYN) IVPB 3.375 g  Status:  Discontinued        3.375 g 12.5 mL/hr over 240 Minutes Intravenous Every 8 hours 07/17/23 0759 07/19/23 1335   07/16/23 1945  piperacillin-tazobactam (ZOSYN) IVPB 3.375 g        3.375 g 100 mL/hr over 30 Minutes Intravenous  Once 07/16/23 1934 07/16/23 2228        Assessment/Plan  Acute gangrenous cholecystitis POD#7 s/p subtotal cholecystectomy 1/9 Dr. Hillery Hunter   ERCP, difficult, 07/23/23 - +bile leak, successfully stented. Drain became SS immediately after but is now frank bile again  AFVSS, creatinine improved LFTs are pending and lipase pending. Will add WBC GI following.  Noted plans for further observation at present, they had offered upsizing the covered bare-metal stent.  Agree, we will continue to follow.  Some of these do in fact close over the ensuing days to week.  If output remains elevated and bilious, I think it would be reasonable to consider a larger covered stent.  JP to bulb suction - output 230 ml/24h--> 135 cc, bilious  FEN: Diet per GI ID: Unasyn VTE: SCDs; holding chemical dvt ppx for 48 hrs post ERCP (can restart this evening)    LOS: 8 days   Marin Olp, MD [facility], A [facility]

## 2023-07-26 NOTE — Progress Notes (Signed)
PROGRESS NOTE    Jaquanda Pirolli  ZOX:[id number] DOB: 01-18-1951 DOA: 07/16/2023 PCP: Olive Bass, FNP   Brief Narrative: Noel Brooking is a 73 y.o. female with a history of sleep apnea on CPAP, obesity.  Patient presented secondary to right upper quadrant and back pain and was found to have evidence of acute cholecystitis.  General surgery was consulted.  Patient started empirically on IV antibiotics.  General surgery performed a subtotal cholecystectomy.  Hospitalization complicated by development of bile leak requiring ERCP with sphincterotomy, common bile duct and distal pancreatic duct stent placement..   Assessment and Plan:  Acute cholecystitis Patient seen and evaluated by general surgery.  Patient underwent subtotal cholecystectomy by general surgery; drain placed.  Complicated by development of bile leak. -Continue Unasyn IV with plan to transition to Augmentin to complete 3 weeks of antibiotics (last day of treatment is 08/13/23)  Bile leak Complication of cholecystectomy.  Gastroenterology was consulted and performed an ERCP on 1/14 which confirmed a bile leak; biliary stent durotomy was performed in addition to sweeping of sludge from biliary tree.  Plastic stent placed into the common bile duct and temporary plastic placed into the distal pancreatic duct.  GI recommendations for KUB 2 view in 10 to 14 days to ensure pancreatic stent has migrated successfully; if not migrated, patient will need repeat EGD for stent pull. Patient's JP drain output became serosanguinous after CBD stent, however it has returned bilious. Lipase normal. Hepatic function testing is normal. -Continue Unasyn IV -GI recommendations: observation; patient will need repeat ERCP in 2 months  Erythematous stomach mucosa Noted during ERCP.  Biopsy performed for H. pylori assessment.  AKI Initial resolution, now worsened likely secondary to poor oral intake from NPO status. Creatinine peak of  1.39. Resolved.  Positive blood culture Initially presumed source is intra-abdominal from cholecystitis. Per ID, likely contaminant. Patient managed on Zosyn IV and transition to Unasyn IV.  Echo obtained on 07/19/23 and was significant for no evidence of valvular lesions.  Repeat blood cultures (1/11) and negative for ongoing growth  Primary hypertension Patient is managed on losartan as an outpatient, which was held on admission.  Blood pressure has been normotensive to slightly hypertensive this admission.  Obstructive sleep apnea -Continue CPAP  Hypothyroidism -Continue levothyroxine  Hypokalemia Patient given potassium supplementation.  Resolved.  Hypernatremia Mild.  Likely secondary to poor oral intake. Resolved.  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 -Continue gabapentin  Obesity, class II Estimated body mass index is 39.59 kg/m as calculated from the following:   Height as of this encounter: 5\' 7"  (1.702 m).   Weight as of this encounter: 114.7 kg.   DVT prophylaxis: SCDs Code Status:   Code Status: Full Code Family Communication: None at bedside Disposition Plan: Discharge home likely in 1 day pending GI and general surgery recommendations and patient's ability to tolerate oral intake   Consultants:  General surgery [facility]  Procedures:  Laparoscopic subtotal cholecystectomy ERCP  Antimicrobials: Zosyn IV Unasyn IV   Subjective: Feeling better than yesterday.  Objective: BP 136/80 (BP Location: Left Arm)   Pulse 79   Temp 98 F (36.7 C) (Oral)   Resp 16   Ht 5\' 7"  (1.702 m)   Wt 114.7 kg   SpO2 94%   BMI 39.59 kg/m   Examination:  General exam: Appears calm and comfortable Respiratory system: Clear to auscultation. Respiratory effort normal. Cardiovascular system: S1 & S2 heard, RRR.  No murmurs. Gastrointestinal system: Abdomen is nondistended, soft and  mildly tender in epigastric area and right quadrant.  Normal bowel sounds heard. Central nervous system: Alert and oriented. No focal neurological deficits. Musculoskeletal: No edema. No calf tenderness Psychiatry: Judgement and insight appear normal. Mood & affect appropriate.    Data Reviewed: I have personally reviewed following labs and imaging studies  CBC Lab Results  Component Value Date   WBC 10.9 (H) 07/25/2023   RBC 4.52 07/25/2023   HGB 12.6 07/25/2023   HCT 39.3 07/25/2023   MCV 86.9 07/25/2023   MCH 27.9 07/25/2023   PLT 364 07/25/2023   MCHC 32.1 07/25/2023   RDW 13.1 07/25/2023   LYMPHSABS 2.0 07/16/2023   MONOABS 1.2 (H) 07/16/2023   EOSABS 0.3 07/16/2023   BASOSABS 0.1 07/16/2023     Last metabolic panel Lab Results  Component Value Date   NA 139 07/25/2023   K 3.4 (L) 07/25/2023   CL 101 07/25/2023   CO2 28 07/25/2023   BUN 13 07/25/2023   CREATININE 1.02 (H) 07/25/2023   GLUCOSE 125 (H) 07/25/2023   GFRNONAA 58 (L) 07/25/2023   CALCIUM 8.1 (L) 07/25/2023   PROT 5.7 (L) 07/25/2023   ALBUMIN 2.3 (L) 07/25/2023   BILITOT 0.5 07/25/2023   ALKPHOS 82 07/25/2023   AST 17 07/25/2023   ALT 19 07/25/2023   ANIONGAP 10 07/25/2023    GFR: Estimated Creatinine Clearance: 65.2 mL/min (A) (by C-G formula based on SCr of 1.02 mg/dL (H)).  Recent Results (from the past 240 hours)  Blood culture (routine x 2)     Status: Abnormal   Collection Time: 07/16/23  8:14 PM   Specimen: Right Antecubital; Blood  Result Value Ref Range Status   Specimen Description   Final    RIGHT ANTECUBITAL BLOOD Performed at [facility], [address] [place], [place] [address]    Special Requests   Final    BOTTLES DRAWN AEROBIC AND ANAEROBIC Blood Culture adequate volume Performed at [facility], [address] [place], [place] [address]    Culture  Setup Time   Final    GRAM POSITIVE COCCI AEROBIC BOTTLE ONLY CRITICAL RESULT CALLED TO, READ BACK  BY AND VERIFIED WITH: PHARMD J LEDFORD 07/18/2023 @ 0151 BY AB Performed at [facility], [address] [place], [place] [address]    Culture STREPTOCOCCUS ANGINOSIS (A)  Final   Report Status 07/20/2023 FINAL  Final   Organism ID, Bacteria STREPTOCOCCUS ANGINOSIS  Final      Susceptibility   Streptococcus anginosis - MIC*    PENICILLIN <=0.06 SENSITIVE Sensitive     CEFTRIAXONE <=0.12 SENSITIVE Sensitive     ERYTHROMYCIN <=0.12 SENSITIVE Sensitive     LEVOFLOXACIN 0.5 SENSITIVE Sensitive     VANCOMYCIN 0.5 SENSITIVE Sensitive     * STREPTOCOCCUS ANGINOSIS  Blood culture (routine x 2)     Status: Abnormal   Collection Time: 07/16/23  8:14 PM   Specimen: Left Antecubital; Blood  Result Value Ref Range Status   Specimen Description   Final    LEFT ANTECUBITAL Performed at [facility], [address] [place], [place] [address]    Special Requests   Final    BOTTLES DRAWN AEROBIC AND ANAEROBIC Blood Culture adequate volume Performed at [facility], [address] [place], [place] [address]    Culture  Setup Time   Final    GRAM POSITIVE  COCCI AEROBIC BOTTLE ONLY CRITICAL RESULT CALLED TO, READ BACK BY AND VERIFIED WITH: PHARMD ANDREW MEYER ON 07/18/23 @ 2021 BY DRT    Culture (A)  Final    STAPHYLOCOCCUS CAPITIS THE SIGNIFICANCE OF ISOLATING THIS ORGANISM FROM A SINGLE SET OF BLOOD CULTURES WHEN MULTIPLE SETS ARE DRAWN IS UNCERTAIN. PLEASE NOTIFY THE MICROBIOLOGY DEPARTMENT WITHIN ONE WEEK IF SPECIATION AND SENSITIVITIES ARE REQUIRED. Performed at [facility], [address] [place], [place] [address]    Report Status 07/22/2023 FINAL  Final  Blood Culture ID Panel (Reflexed)     Status: Abnormal   Collection Time: 07/16/23  8:14 PM  Result Value Ref Range Status   Enterococcus faecalis NOT DETECTED NOT DETECTED Final   Enterococcus Faecium NOT DETECTED NOT DETECTED Final   Listeria monocytogenes NOT DETECTED NOT DETECTED Final    Staphylococcus species NOT DETECTED NOT DETECTED Final   Staphylococcus aureus (BCID) NOT DETECTED NOT DETECTED Final   Staphylococcus epidermidis NOT DETECTED NOT DETECTED Final   Staphylococcus lugdunensis NOT DETECTED NOT DETECTED Final   Streptococcus species DETECTED (A) NOT DETECTED Final    Comment: Not Enterococcus species, Streptococcus agalactiae, Streptococcus pyogenes, or Streptococcus pneumoniae. CRITICAL RESULT CALLED TO, READ BACK BY AND VERIFIED WITH: PHARMD J LEDFORD 07/18/2023 @ 0151 BY AB    Streptococcus agalactiae NOT DETECTED NOT DETECTED Final   Streptococcus pneumoniae NOT DETECTED NOT DETECTED Final   Streptococcus pyogenes NOT DETECTED NOT DETECTED Final   A.calcoaceticus-baumannii NOT DETECTED NOT DETECTED Final   Bacteroides fragilis NOT DETECTED NOT DETECTED Final   Enterobacterales NOT DETECTED NOT DETECTED Final   Enterobacter cloacae complex NOT DETECTED NOT DETECTED Final   Escherichia coli NOT DETECTED NOT DETECTED Final   Klebsiella aerogenes NOT DETECTED NOT DETECTED Final   Klebsiella oxytoca NOT DETECTED NOT DETECTED Final   Klebsiella pneumoniae NOT DETECTED NOT DETECTED Final   Proteus species NOT DETECTED NOT DETECTED Final   Salmonella species NOT DETECTED NOT DETECTED Final   Serratia marcescens NOT DETECTED NOT DETECTED Final   Haemophilus influenzae NOT DETECTED NOT DETECTED Final   Neisseria meningitidis NOT DETECTED NOT DETECTED Final   Pseudomonas aeruginosa NOT DETECTED NOT DETECTED Final   Stenotrophomonas maltophilia NOT DETECTED NOT DETECTED Final   Candida albicans NOT DETECTED NOT DETECTED Final   Candida auris NOT DETECTED NOT DETECTED Final   Candida glabrata NOT DETECTED NOT DETECTED Final   Candida krusei NOT DETECTED NOT DETECTED Final   Candida parapsilosis NOT DETECTED NOT DETECTED Final   Candida tropicalis NOT DETECTED NOT DETECTED Final   Cryptococcus neoformans/gattii NOT DETECTED NOT DETECTED Final    Comment: Performed  at [facility], [address], [place], [place] [address]  Surgical pcr screen     Status: Abnormal   Collection Time: 07/18/23  3:44 AM   Specimen: Nasal Mucosa; Nasal Swab  Result Value Ref Range Status   MRSA, PCR NEGATIVE NEGATIVE Final   Staphylococcus aureus POSITIVE (A) NEGATIVE Final    Comment: (NOTE) The Xpert SA Assay (FDA approved for NASAL specimens in patients 13 years of age and older), is one component of a comprehensive surveillance program. It is not intended to diagnose infection nor to guide or monitor treatment. Performed at [facility], [address], [place], [place] [address]   Blood Culture ID Panel (Reflexed)     Status: Abnormal   Collection Time: 07/18/23  8:14 PM  Result Value Ref Range Status   Enterococcus faecalis NOT  DETECTED NOT DETECTED Final   Enterococcus Faecium NOT DETECTED NOT DETECTED Final   Listeria monocytogenes NOT DETECTED NOT DETECTED Final   Staphylococcus species DETECTED (A) NOT DETECTED Final    Comment: CRITICAL RESULT CALLED TO, READ BACK BY AND VERIFIED WITH: PHARMD ANDREW MEYER ON 07/18/23 @ 2038 BY DRT    Staphylococcus aureus (BCID) NOT DETECTED NOT DETECTED Final   Staphylococcus epidermidis NOT DETECTED NOT DETECTED Final   Staphylococcus lugdunensis NOT DETECTED NOT DETECTED Final   Streptococcus species NOT DETECTED NOT DETECTED Final   Streptococcus agalactiae NOT DETECTED NOT DETECTED Corrected    Comment: CORRECTED ON 01/10 AT 0815: PREVIOUSLY REPORTED AS NOT DETECTED CRITICAL RESULT CALLED TO, READ BACK BY AND VERIFIED WITH: PHARMD ANDREW MEYER ON 07/18/23 @ 2038 BY DRT   Streptococcus pneumoniae NOT DETECTED NOT DETECTED Final   Streptococcus pyogenes NOT DETECTED NOT DETECTED Final   A.calcoaceticus-baumannii NOT DETECTED NOT DETECTED Final   Bacteroides fragilis NOT DETECTED NOT DETECTED Final   Enterobacterales NOT DETECTED NOT DETECTED Final   Enterobacter cloacae complex NOT DETECTED NOT DETECTED  Final   Escherichia coli NOT DETECTED NOT DETECTED Final   Klebsiella aerogenes NOT DETECTED NOT DETECTED Final   Klebsiella oxytoca NOT DETECTED NOT DETECTED Final   Klebsiella pneumoniae NOT DETECTED NOT DETECTED Final   Proteus species NOT DETECTED NOT DETECTED Final   Salmonella species NOT DETECTED NOT DETECTED Final   Serratia marcescens NOT DETECTED NOT DETECTED Final   Haemophilus influenzae NOT DETECTED NOT DETECTED Final   Neisseria meningitidis NOT DETECTED NOT DETECTED Final   Pseudomonas aeruginosa NOT DETECTED NOT DETECTED Final   Stenotrophomonas maltophilia NOT DETECTED NOT DETECTED Final   Candida albicans NOT DETECTED NOT DETECTED Final   Candida auris NOT DETECTED NOT DETECTED Final   Candida glabrata NOT DETECTED NOT DETECTED Final   Candida krusei NOT DETECTED NOT DETECTED Final   Candida parapsilosis NOT DETECTED NOT DETECTED Final   Candida tropicalis NOT DETECTED NOT DETECTED Final   Cryptococcus neoformans/gattii NOT DETECTED NOT DETECTED Final    Comment: Performed at [facility], [address], [place], [place] [address]  Culture, blood (Routine X 2) w Reflex to ID Panel     Status: None   Collection Time: 07/20/23  4:56 AM   Specimen: BLOOD  Result Value Ref Range Status   Specimen Description BLOOD BLOOD RIGHT HAND  Final   Special Requests   Final    BOTTLES DRAWN AEROBIC AND ANAEROBIC Blood Culture results may not be optimal due to an inadequate volume of blood received in culture bottles   Culture   Final    NO GROWTH 5 DAYS Performed at [facility], [address] [place], [place] [address]    Report Status 07/25/2023 FINAL  Final  Culture, blood (Routine X 2) w Reflex to ID Panel     Status: None   Collection Time: 07/20/23  4:57 AM   Specimen: BLOOD  Result Value Ref Range Status   Specimen Description BLOOD BLOOD RIGHT HAND  Final   Special Requests   Final    BOTTLES DRAWN AEROBIC AND ANAEROBIC Blood Culture results may not be  optimal due to an inadequate volume of blood received in culture bottles   Culture   Final    NO GROWTH 5 DAYS Performed at [facility], [address] [place], [place] [address]    Report Status 07/25/2023 FINAL  Final      Radiology  Studies: DG Abd 2 Views Result Date: 07/25/2023 CLINICAL DATA:  Biliary stent insertion, bile leak. EXAM: ABDOMEN - 2 VIEW COMPARISON:  ERCP images from 07/23/2023 FINDINGS: The biliary stent remains in place. There is also a tube/drain projecting along the inferior margin of the right hepatic lobe and terminating in the porta hepatis. I do not see the temporary ventral pancreatic duct stent in the expected position. I do see a small linear structure which may represent the original pancreatic duct stent projecting to the left of the spine at L4-5, possibly representing the stent transiting in the bowel. Blunting of the right costophrenic angle potentially from atelectasis or a trace pleural effusion. Thoracolumbar spondylosis. IMPRESSION: 1. The biliary stent remains in place. 2. The temporary ventral pancreatic duct stent is not seen in the expected position. I do see a small linear structure which may represent the original pancreatic duct stent projecting to the left of the spine at L4-5, possibly representing the stent transiting in the bowel. 3. Blunting of the right costophrenic angle potentially from atelectasis or a trace pleural effusion. Electronically Signed   By: Gaylyn Rong M.D.   On: 07/25/2023 18:20      LOS: 8 days    Jacquelin Hawking, MD [facility] 07/26/2023, 7:34 AM   If 7PM-7AM, please contact night-coverage [email or web address]

## 2023-07-27 DIAGNOSIS — K838 Other specified diseases of biliary tract: Secondary | ICD-10-CM | POA: Diagnosis not present

## 2023-07-27 DIAGNOSIS — K810 Acute cholecystitis: Secondary | ICD-10-CM | POA: Diagnosis not present

## 2023-07-27 DIAGNOSIS — K9189 Other postprocedural complications and disorders of digestive system: Secondary | ICD-10-CM | POA: Diagnosis not present

## 2023-07-27 LAB — COMPREHENSIVE METABOLIC PANEL
ALT: 15 U/L (ref 0–44)
AST: 16 U/L (ref 15–41)
Albumin: 2.5 g/dL — ABNORMAL LOW (ref 3.5–5.0)
Alkaline Phosphatase: 91 U/L (ref 38–126)
Anion gap: 11 (ref 5–15)
BUN: 8 mg/dL (ref 8–23)
CO2: 25 mmol/L (ref 22–32)
Calcium: 8.3 mg/dL — ABNORMAL LOW (ref 8.9–10.3)
Chloride: 100 mmol/L (ref 98–111)
Creatinine, Ser: 0.94 mg/dL (ref 0.44–1.00)
GFR, Estimated: >60 mL/min (ref >60)
Glucose, Bld: 136 mg/dL — ABNORMAL HIGH (ref 70–99)
Potassium: 3.8 mmol/L (ref 3.5–5.1)
Sodium: 136 mmol/L (ref 135–145)
Total Bilirubin: 0.7 mg/dL (ref 0.0–1.2)
Total Protein: 6 g/dL — ABNORMAL LOW (ref 6.5–8.1)

## 2023-07-27 LAB — GLUCOSE, CAPILLARY
Glucose-Capillary: 126 mg/dL — ABNORMAL HIGH (ref 70–99)
Glucose-Capillary: 181 mg/dL — ABNORMAL HIGH (ref 70–99)

## 2023-07-27 MED ORDER — AMOXICILLIN-POT CLAVULANATE 875-125 MG PO TABS: 1.0000 | ORAL_TABLET | Freq: Two times a day (BID) | ORAL | 0 refills | Status: AC

## 2023-07-27 MED ORDER — OXYCODONE HCL 5 MG PO TABS: 5.0000 mg | ORAL_TABLET | ORAL | 0 refills | Status: AC | PRN

## 2023-07-27 NOTE — Discharge Instructions (Addendum)
Tricia Dodson,  You were in the hospital with an infected gallbladder. This was managed with a partial cholecystectomy. Unfortunately, you required a stent because of bile leaking into your drain. Thankfully this has improved your drainage. Please follow-up with the general surgeon this upcoming week for follow-up of your drain output. Please continue antibiotics on discharge. You will also need to follow-up with the GI physicians in two months regarding your stent.

## 2023-07-27 NOTE — Discharge Summary (Signed)
Physician Discharge Summary   Patient: Tricia Dodson MRN: [id number] DOB: [birth year]  Admit date:     07/16/2023  Discharge date: 07/27/23  Discharge Physician: Jacquelin Hawking, MD   PCP: Olive Bass, FNP   Recommendations at discharge:  PCP visit for hospital follow-up GI visit for follow-up ERCP General surgery follow-up for drain and post-op management  Discharge Diagnoses: Principal Problem:   Acute gangrenous cholecystitis Active Problems:   CPAP (continuous positive airway pressure) dependence   Hypertension   Cholecystitis, acute   AKI (acute kidney injury) (HCC)   Bile leak, postoperative   Gastritis and gastroduodenitis  Resolved Problems:   * No resolved hospital problems. [facility] Course: Yaretsy Groulx is a 73 y.o. female with a history of sleep apnea on CPAP, obesity.  Patient presented secondary to right upper quadrant and back pain and was found to have evidence of acute cholecystitis.  General surgery was consulted.  Patient started empirically on IV antibiotics.  General surgery performed a subtotal cholecystectomy.  Hospitalization complicated by development of bile leak requiring ERCP with sphincterotomy, common bile duct and distal pancreatic duct stent placement.. Patient discharged on antibiotics.  Assessment and Plan:  Acute gangrenous cholecystitis Patient seen and evaluated by general surgery.  Patient underwent subtotal cholecystectomy by general surgery; drain placed.  Complicated by development of bile leak. Patient to follow-up with general surgery as an outpatient. Discharged with antibiotics. -Continue Unasyn IV with plan to transition to Augmentin to complete 3 weeks of antibiotics (last day of treatment is 08/13/23)   Bile leak Complication of cholecystectomy.  Gastroenterology was consulted and performed an ERCP on 1/14 which confirmed a bile leak; biliary stent durotomy was performed in addition to sweeping of sludge from biliary  tree.  Plastic stent placed into the common bile duct and temporary plastic placed into the distal pancreatic duct.  GI recommendations for KUB 2 view in 10 to 14 days to ensure pancreatic stent has migrated successfully; if not migrated, patient will need repeat EGD for stent pull. Patient's JP drain output became serosanguinous after CBD stent, however it has returned bilious. Lipase normal. Hepatic function testing is normal. Recommendation to follow-up with GI with repeat ERCP in 2 months.   Erythematous stomach mucosa Noted during ERCP.  Biopsy performed for H. pylori assessment.   AKI Initial resolution, now worsened likely secondary to poor oral intake from NPO status. Creatinine peak of 1.39. Resolved.   Positive blood culture Initially presumed source is intra-abdominal from cholecystitis. Per ID, likely contaminant. Patient managed on Zosyn IV and transition to Unasyn IV.  Echo obtained on 07/19/23 and was significant for no evidence of valvular lesions.  Repeat blood cultures (1/11) and negative for ongoing growth   Primary hypertension Patient is managed on losartan as an outpatient, which was held on admission.  Blood pressure has been normotensive to slightly hypertensive this admission.   Obstructive sleep apnea Continue CPAP   Hypothyroidism Continue levothyroxine   Hypokalemia Patient given potassium supplementation.  Resolved.   Hypernatremia Mild.  Likely secondary to poor oral intake. Resolved.   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 Continue gabapentin.   Obesity, class II Estimated body mass index is 39.59 kg/m as calculated from the following:   Height as of this encounter: 5\' 7"  (1.702 m).   Weight as of this encounter: 114.7 kg.   Consultants:  General surgery [facility]   Procedures:  Laparoscopic subtotal cholecystectomy ERCP  Disposition: Home Diet recommendation:  Regular diet   DISCHARGE MEDICATION: Allergies as of 07/27/2023   No Known Allergies      Medication List     TAKE these medications    amitriptyline 10 MG tablet Commonly known as: ELAVIL Take 1 tablet (10 mg total) by mouth at bedtime.   amoxicillin-clavulanate 875-125 MG tablet Commonly known as: AUGMENTIN Take 1 tablet by mouth 2 (two) times daily for 18 days. Stop date 08/13/23   atorvastatin 10 MG tablet Commonly known as: LIPITOR Take 1 tablet (10 mg total) by mouth daily. What changed: when to take this   cyclobenzaprine 10 MG tablet Commonly known as: FLEXERIL Take 0.5-1 tablets (5-10 mg total) by mouth 3 (three) times daily as needed.   DULoxetine 20 MG capsule Commonly known as: CYMBALTA Take 1 capsule (20 mg total) by mouth daily.   empagliflozin 10 MG Tabs tablet Commonly known as: Jardiance Take 1 tablet (10 mg total) by mouth daily.   fenofibrate micronized 134 MG capsule Commonly known as: LOFIBRA Take 1 capsule (134 mg total) by mouth daily before breakfast.   gabapentin 300 MG capsule Commonly known as: NEURONTIN Take 1 capsule (300 mg total) by mouth 2 (two) times daily.   ipratropium 0.03 % nasal spray Commonly known as: ATROVENT Place 2 sprays into both nostrils every 12 (twelve) hours. What changed:  when to take this reasons to take this   levocetirizine 5 MG tablet Commonly known as: XYZAL Take 1 tablet (5 mg total) by mouth every evening.   levothyroxine 25 MCG tablet Commonly known as: SYNTHROID TAKE 1 TABLET(25 MCG) BY MOUTH DAILY   losartan 50 MG tablet Commonly known as: COZAAR Take 50 mg by mouth every evening.   naproxen 500 MG tablet Commonly known as: NAPROSYN Take 1 tablet (500 mg total) by mouth 2 (two) times daily with a meal.   omeprazole 40 MG capsule Commonly known as: PRILOSEC TAKE 1 CAPSULE(40 MG) BY MOUTH TWICE DAILY What changed: See the new instructions.   oxyCODONE 5 MG immediate release  tablet Commonly known as: Oxy IR/ROXICODONE Take 1 tablet (5 mg total) by mouth every 4 (four) hours as needed for up to 3 days for moderate pain (pain score 4-6).               Durable Medical Equipment  (From admission, onward)           Start     Ordered   07/22/23 1632  For home use only DME Walker rolling  Once       Question Answer Comment  Walker: With 5 Inch Wheels   Patient needs a walker to treat with the following condition Weakness      07/22/23 1631            Follow-up Information     Olive Bass, FNP. Schedule an appointment as soon as possible for a visit in 1 week(s).   Specialty: Internal Medicine Why: For hospital follow-up Contact information: [address] [place] [address] [phone]         Moise Boring, MD Follow up.   Specialty: General Surgery Contact information: [address] [place] [address] [phone]         Mansouraty, Netty Starring., MD. Schedule an appointment as soon as possible for a visit in 2 month(s).   Specialties: Gastroenterology, Internal Medicine Why: ERCP and biliary stent maangement Contact information: [address] [place] [address] [phone]                Discharge Exam: BP 131/80 (BP Location: Left Arm)   Pulse 96   Temp 99.1 F (37.3 C) (Oral)   Resp 16   Ht 5\' 7"  (1.702 m)   Wt 114.7 kg   SpO2 94%   BMI 39.59 kg/m   General exam: Appears calm and comfortable Respiratory system: Clear to auscultation. Respiratory effort normal. Cardiovascular system: S1 & S2 heard, RRR. No murmurs. Gastrointestinal system: Abdomen is nondistended, soft and mildly tender. Normal bowel sounds heard. Central nervous system: Alert and oriented. No focal neurological deficits. Musculoskeletal: No edema. No calf tenderness Skin: No cyanosis. No rashes Psychiatry: Judgement and insight appear normal. Mood & affect appropriate.    Condition at discharge: stable  The results of significant diagnostics from this hospitalization (including imaging, microbiology, ancillary and laboratory) are listed below for reference.   Imaging Studies: DG Abd 2 Views Result Date: 07/25/2023 CLINICAL DATA:  Biliary stent insertion, bile leak. EXAM: ABDOMEN - 2 VIEW COMPARISON:  ERCP images from 07/23/2023 FINDINGS: The biliary stent remains in place. There is also a tube/drain projecting along the inferior margin of the right hepatic lobe and terminating in the porta hepatis. I do not see the temporary ventral pancreatic duct stent in the expected position. I do see a small linear structure which may represent the original pancreatic duct stent projecting to the left of the spine at L4-5, possibly representing the stent transiting in the bowel. Blunting of the right costophrenic angle potentially from atelectasis or a trace pleural effusion. Thoracolumbar spondylosis. IMPRESSION: 1. The biliary stent remains in place. 2. The temporary ventral pancreatic duct stent is not seen in the expected position. I do see a small linear structure which may represent the original pancreatic duct stent projecting to the left of the spine at L4-5, possibly representing the stent transiting in the bowel. 3. Blunting of the right costophrenic angle potentially from atelectasis or a trace pleural effusion. Electronically Signed   By: Gaylyn Rong M.D.   On: 07/25/2023 18:20   DG ERCP Result Date: 07/24/2023 CLINICAL DATA:  Possible choledocholithiasis EXAM: ERCP TECHNIQUE: Multiple spot images obtained with the fluoroscopic device and submitted for interpretation post-procedure. FLUOROSCOPY: Radiation Exposure Index (as provided by the fluoroscopic device): 69.8 mGy Kerma COMPARISON:  None Available. FINDINGS: Seven intraoperative saved images are submitted for review. The images demonstrate a flexible duodenal scope in the descending duodenum followed by wire  cannulation of the pancreatic and common bile ducts. Subsequent images demonstrate placement of a plastic biliary stent. Cholecystectomy clips are present suggesting prior cholecystectomy. IMPRESSION: ERCP with placement of a plastic biliary stent. These images were submitted for radiologic interpretation only. Please see the procedural report for the amount of contrast and the fluoroscopy time utilized. Electronically Signed   By: Malachy Moan M.D.   On: 07/24/2023 08:00   DG C-Arm 1-60 Min-No Report Result Date: 07/23/2023 Fluoroscopy was utilized by the requesting physician.  No radiographic interpretation.   NM HEPATOBILIARY LEAK (POST-SURGICAL) Result Date: 07/22/2023 CLINICAL DATA:  Prior cholecystectomy 07/18/2023.  Possible leak. EXAM: NUCLEAR MEDICINE HEPATOBILIARY IMAGING TECHNIQUE: Sequential images of the abdomen were obtained out to 60 minutes following intravenous administration of radiopharmaceutical. The drainage tube was clamped by the technologist prior to acquiring images. RADIOPHARMACEUTICALS:  5.4 mCi Tc-61m  Choletec IV COMPARISON:  Pre-surgical MRI 07/18/2023.  CT 07/16/2023. FINDINGS: Prompt uptake and biliary excretion of  activity by the liver is seen. Biliary activity passes into small bowel, consistent with patent common bile duct. Beginning at approximately 50 minutes, there is accumulation of radiotracer lateral to the common duct in the location of the previous gallbladder. This has worrisome for a leak. IMPRESSION: Accumulation radiotracer lateral to the common duct in the presumed location of the gallbladder fossa worrisome for leak. If needed additional cross-sectional imaging such as CT or MRI could be considered as clinically appropriate to further delineate. Electronically Signed   By: Karen Kays M.D.   On: 07/22/2023 12:52   ECHOCARDIOGRAM COMPLETE Result Date: 07/19/2023    ECHOCARDIOGRAM REPORT   Patient Name:   Murphee Toupin Date of Exam: 07/19/2023 Medical  Rec #:  [id number]        Height:       67.0 in Accession #:    [id number]       Weight:       252.0 lb Date of Birth:  1950/11/30        BSA:          2.231 m Patient Age:    72 years         BP:           125/75 mmHg Patient Gender: F                HR:           64 bpm. Exam Location:  Inpatient Procedure: 2D Echo, Color Doppler and Cardiac Doppler Indications:    Bacteremia  History:        Patient has prior history of Echocardiogram examinations, most                 recent 01/02/2021. Abnormal ECG; Risk Factors:Hypertension.  Sonographer:    Amy Chionchio Referring Phys: Marlin Canary, U IMPRESSIONS  1. Left ventricular ejection fraction, by estimation, is 60 to 65%. The left ventricle has normal function. The left ventricle has no regional wall motion abnormalities. There is mild left ventricular hypertrophy. Left ventricular diastolic parameters are consistent with Grade I diastolic dysfunction (impaired relaxation).  2. Right ventricular systolic function is normal. The right ventricular size is normal.  3. The mitral valve is normal in structure. Trivial mitral valve regurgitation. No evidence of mitral stenosis.  4. The aortic valve is tricuspid. There is mild calcification of the aortic valve. Aortic valve regurgitation is not visualized. Aortic valve sclerosis/calcification is present, without any evidence of aortic stenosis.  5. The inferior vena cava is normal in size with greater than 50% respiratory variability, suggesting right atrial pressure of 3 mmHg. FINDINGS  Left Ventricle: Left ventricular ejection fraction, by estimation, is 60 to 65%. The left ventricle has normal function. The left ventricle has no regional wall motion abnormalities. The left ventricular internal cavity size was normal in size. There is  mild left ventricular hypertrophy. Left ventricular diastolic parameters are consistent with Grade I diastolic dysfunction (impaired relaxation). Right Ventricle: The right ventricular size  is normal. No increase in right ventricular wall thickness. Right ventricular systolic function is normal. Left Atrium: Left atrial size was normal in size. Right Atrium: Right atrial size was normal in size. Pericardium: There is no evidence of pericardial effusion. Mitral Valve: The mitral valve is normal in structure. There is mild calcification of the mitral valve leaflet(s). Trivial mitral valve regurgitation. No evidence of mitral valve stenosis. MV peak gradient, 4.2 mmHg. The mean mitral valve gradient is 2.0  mmHg. Tricuspid  Valve: The tricuspid valve is normal in structure. Tricuspid valve regurgitation is trivial. No evidence of tricuspid stenosis. Aortic Valve: The aortic valve is tricuspid. There is mild calcification of the aortic valve. Aortic valve regurgitation is not visualized. Aortic valve sclerosis/calcification is present, without any evidence of aortic stenosis. Aortic valve mean gradient measures 4.0 mmHg. Aortic valve peak gradient measures 7.2 mmHg. Aortic valve area, by VTI measures 2.47 cm. Pulmonic Valve: The pulmonic valve was normal in structure. Pulmonic valve regurgitation is trivial. No evidence of pulmonic stenosis. Aorta: The aortic root is normal in size and structure. Venous: The inferior vena cava is normal in size with greater than 50% respiratory variability, suggesting right atrial pressure of 3 mmHg. IAS/Shunts: No atrial level shunt detected by color flow Doppler.  LEFT VENTRICLE PLAX 2D LVIDd:         4.00 cm     Diastology LVIDs:         2.90 cm     LV e' medial:    7.07 cm/s LV PW:         0.90 cm     LV E/e' medial:  12.5 LV IVS:        1.10 cm     LV e' lateral:   8.05 cm/s LVOT diam:     2.00 cm     LV E/e' lateral: 11.0 LV SV:         73 LV SV Index:   33 LVOT Area:     3.14 cm  LV Volumes (MOD) LV vol d, MOD A2C: 64.0 ml LV vol d, MOD A4C: 68.0 ml LV vol s, MOD A2C: 21.1 ml LV vol s, MOD A4C: 22.6 ml LV SV MOD A2C:     42.9 ml LV SV MOD A4C:     68.0 ml LV SV MOD  BP:      43.9 ml RIGHT VENTRICLE             IVC RV Basal diam:  3.50 cm     IVC diam: 1.60 cm RV S prime:     11.50 cm/s TAPSE (M-mode): 2.3 cm LEFT ATRIUM             Index        RIGHT ATRIUM           Index LA Vol (A2C):   32.9 ml 14.75 ml/m  RA Area:     17.90 cm LA Vol (A4C):   46.2 ml 20.71 ml/m  RA Volume:   51.00 ml  22.86 ml/m LA Biplane Vol: 39.3 ml 17.62 ml/m  AORTIC VALVE                    PULMONIC VALVE AV Area (Vmax):    2.51 cm     PV Vmax:       0.91 m/s AV Area (Vmean):   2.32 cm     PV Peak grad:  3.3 mmHg AV Area (VTI):     2.47 cm AV Vmax:           134.00 cm/s AV Vmean:          95.600 cm/s AV VTI:            0.294 m AV Peak Grad:      7.2 mmHg AV Mean Grad:      4.0 mmHg LVOT Vmax:         107.00 cm/s LVOT Vmean:  70.600 cm/s LVOT VTI:          0.231 m LVOT/AV VTI ratio: 0.79  AORTA Ao Root diam: 2.90 cm Ao Asc diam:  3.30 cm MITRAL VALVE MV Area (PHT): 3.00 cm    SHUNTS MV Area VTI:   2.17 cm    Systemic VTI:  0.23 m MV Peak grad:  4.2 mmHg    Systemic Diam: 2.00 cm MV Mean grad:  2.0 mmHg MV Vmax:       1.02 m/s MV Vmean:      62.6 cm/s MV Decel Time: 253 msec MV E velocity: 88.60 cm/s MV A velocity: 88.60 cm/s MV E/A ratio:  1.00 Arvilla Meres MD Electronically signed by Arvilla Meres MD Signature Date/Time: 07/19/2023/3:44:05 PM    Final    MR ABDOMEN MRCP WO CONTRAST Result Date: 07/18/2023 CLINICAL DATA:  Gallstones with findings of cholecystitis on ultrasound. EXAM: MRI ABDOMEN WITHOUT CONTRAST  (INCLUDING MRCP) TECHNIQUE: Multiplanar multisequence MR imaging of the abdomen was performed. Heavily T2-weighted images of the biliary and pancreatic ducts were obtained, and three-dimensional MRCP images were rendered by post processing. COMPARISON:  Ultrasound and CT scans from 07/16/2023 FINDINGS: Lower chest: Trace right pleural effusion. Hepatobiliary: Motion degraded exam shows no focal suspicious abnormality within the liver parenchyma. Trace intrahepatic biliary  duct prominence. Gallbladder wall thickening evident with pericholecystic edema/inflammation. Gallbladder is distended with layering sludge and stones. Adjacent stones are seen in the neck of the gallbladder, potentially just into the proximal cystic duct (see coronal T2 images 28 in 29 of series 3 and axial T2 images 23 and 24 of series 4). Common duct is 8 mm diameter in the porta hepatis region. Common bile duct in the head of the pancreas measures 7 mm diameter. No definite stone is identified in the common bile duct. Axial T2 single shot fast spin echo imaging shows a central linear hypointense band in the distal common bile duct that is not present on the axial T2 fat suppressed imaging nor coronal single-shot and MRCP imaging. As such, this is felt to be more consistent with flow artifact than a true ductal stone. Pancreas: Pancreas is diffusely atrophic without main duct dilatation. Spleen:  No splenomegaly. No suspicious focal mass lesion. Adrenals/Urinary Tract: No adrenal nodule or mass. Kidneys unremarkable. Stomach/Bowel: Stomach is unremarkable. No gastric wall thickening. No evidence of outlet obstruction. Duodenum is normally positioned as is the ligament of Treitz. Duodenal diverticulum noted. No small bowel or colonic dilatation within the visualized abdomen. Vascular/Lymphatic: No abdominal aortic aneurysm. No abdominal lymphadenopathy. Other:  No intraperitoneal free fluid. Musculoskeletal: No worrisome lytic or sclerotic osseous abnormality. IMPRESSION: 1. Gallbladder wall thickening with pericholecystic edema/inflammation. Gallbladder is distended with layering sludge and stones. Adjacent stones are seen in the neck of the gallbladder, potentially just into the proximal cystic duct. Imaging features compatible with acute cholecystitis. 2. Common bile duct is 8 mm diameter in the porta hepatis region. Common bile duct in the head of the pancreas measures 7 mm diameter. No definite stone is  identified in the common bile duct. Axial T2 single shot fast spin echo imaging shows a central linear hypointense band in the distal common bile duct that is not present on the axial T2 fat suppressed imaging nor coronal single-shot and MRCP imaging. As such, this is felt to be more consistent with flow artifact than a true ductal stone although a tiny distal common bile duct stone is not excluded. 3. Trace right pleural effusion. Electronically Signed  By: Kennith Center M.D.   On: 07/18/2023 06:24   MR 3D Recon At Scanner Result Date: 07/18/2023 CLINICAL DATA:  Gallstones with findings of cholecystitis on ultrasound. EXAM: MRI ABDOMEN WITHOUT CONTRAST  (INCLUDING MRCP) TECHNIQUE: Multiplanar multisequence MR imaging of the abdomen was performed. Heavily T2-weighted images of the biliary and pancreatic ducts were obtained, and three-dimensional MRCP images were rendered by post processing. COMPARISON:  Ultrasound and CT scans from 07/16/2023 FINDINGS: Lower chest: Trace right pleural effusion. Hepatobiliary: Motion degraded exam shows no focal suspicious abnormality within the liver parenchyma. Trace intrahepatic biliary duct prominence. Gallbladder wall thickening evident with pericholecystic edema/inflammation. Gallbladder is distended with layering sludge and stones. Adjacent stones are seen in the neck of the gallbladder, potentially just into the proximal cystic duct (see coronal T2 images 28 in 29 of series 3 and axial T2 images 23 and 24 of series 4). Common duct is 8 mm diameter in the porta hepatis region. Common bile duct in the head of the pancreas measures 7 mm diameter. No definite stone is identified in the common bile duct. Axial T2 single shot fast spin echo imaging shows a central linear hypointense band in the distal common bile duct that is not present on the axial T2 fat suppressed imaging nor coronal single-shot and MRCP imaging. As such, this is felt to be more consistent with flow artifact  than a true ductal stone. Pancreas: Pancreas is diffusely atrophic without main duct dilatation. Spleen:  No splenomegaly. No suspicious focal mass lesion. Adrenals/Urinary Tract: No adrenal nodule or mass. Kidneys unremarkable. Stomach/Bowel: Stomach is unremarkable. No gastric wall thickening. No evidence of outlet obstruction. Duodenum is normally positioned as is the ligament of Treitz. Duodenal diverticulum noted. No small bowel or colonic dilatation within the visualized abdomen. Vascular/Lymphatic: No abdominal aortic aneurysm. No abdominal lymphadenopathy. Other:  No intraperitoneal free fluid. Musculoskeletal: No worrisome lytic or sclerotic osseous abnormality. IMPRESSION: 1. Gallbladder wall thickening with pericholecystic edema/inflammation. Gallbladder is distended with layering sludge and stones. Adjacent stones are seen in the neck of the gallbladder, potentially just into the proximal cystic duct. Imaging features compatible with acute cholecystitis. 2. Common bile duct is 8 mm diameter in the porta hepatis region. Common bile duct in the head of the pancreas measures 7 mm diameter. No definite stone is identified in the common bile duct. Axial T2 single shot fast spin echo imaging shows a central linear hypointense band in the distal common bile duct that is not present on the axial T2 fat suppressed imaging nor coronal single-shot and MRCP imaging. As such, this is felt to be more consistent with flow artifact than a true ductal stone although a tiny distal common bile duct stone is not excluded. 3. Trace right pleural effusion. Electronically Signed   By: Kennith Center M.D.   On: 07/18/2023 06:24   US Abdomen Limited RUQ (LIVER/GB) Result Date: 07/16/2023 CLINICAL DATA:  Right upper quadrant pain. Abnormal gallbladder on CT. EXAM: ULTRASOUND ABDOMEN LIMITED RIGHT UPPER QUADRANT COMPARISON:  Ultrasound earlier today FINDINGS: Gallbladder: Gallstones within the gallbladder measuring up to 1.6 cm.  There is sludge layering in the gallbladder. Wall thickening of the 6 mm. Patient was tender over the gallbladder during the study. Pericholecystic fluid noted. Common bile duct: Diameter: Not visualized Liver: Increased echotexture compatible with fatty infiltration. No focal abnormality or biliary ductal dilatation. Portal vein is patent on color Doppler imaging with normal direction of blood flow towards the liver. Other: None. IMPRESSION: Cholelithiasis. Sludge, gallbladder wall  thickening and pericholecystic with sonographic Murphy sign. Findings compatible with acute cholecystitis. Fatty liver. Electronically Signed   By: Charlett Nose M.D.   On: 07/16/2023 21:10   CT ABDOMEN PELVIS WO CONTRAST Result Date: 07/16/2023 CLINICAL DATA:  Upper abdominal and back pain EXAM: CT ABDOMEN AND PELVIS WITHOUT CONTRAST TECHNIQUE: Multidetector CT imaging of the abdomen and pelvis was performed following the standard protocol without IV contrast. RADIATION DOSE REDUCTION: This exam was performed according to the departmental dose-optimization program which includes automated exposure control, adjustment of the mA and/or kV according to patient size and/or use of iterative reconstruction technique. COMPARISON:  Overlapping portions CT chest 07/16/2023 FINDINGS: Lower chest: Unremarkable Hepatobiliary: Substantial abnormal wall thickening in the gallbladder with associated pericholecystic stranding. Several gallstones measure up to 1 cm in diameter. There is also a go gallstone measuring 1 cm in the neck of the gallbladder. No CBD dilatation is observed. Appearance raises suspicion for acute cholecystitis. Pancreas: Unremarkable Spleen: Unremarkable Adrenals/Urinary Tract: Both adrenal glands appear normal. Excreted contrast observed in the collecting systems, ureters, and urinary bladder. No significant abnormality observed. Stomach/Bowel: Periampullary duodenal diverticulum. There are diverticula in the fourth portion of  the duodenum without signs of inflammation. Normal appendix. There are few scattered sigmoid colon diverticula. Vascular/Lymphatic: Atherosclerosis is present, including aortoiliac atherosclerotic disease. Reproductive: Calcified lesion favoring fibroid along the left uterine body. Masslike central uterine lesion probably represents leiomyoma given the similar appearance on pelvic ultrasound of 01/27/2021. Other: No supplemental non-categorized findings. Musculoskeletal: Mild grade 1 degenerative anterolisthesis at L4-5. IMPRESSION: 1. Substantial abnormal wall thickening in the gallbladder with associated pericholecystic stranding and cholelithiasis. Appearance suspicious for acute cholecystitis. 2. Periampullary and distal duodenal diverticula without diverticular inflammation. 3. Sigmoid colon diverticulosis. 4. Mild grade 1 degenerative anterolisthesis at L4-5. 5. Large central uterine leiomyoma as reported on a prior pelvic ultrasound. 6. Aortic atherosclerosis. Aortic Atherosclerosis (ICD10-I70.0). Electronically Signed   By: Gaylyn Rong M.D.   On: 07/16/2023 19:29   CT T-SPINE NO CHARGE Result Date: 07/16/2023 CLINICAL DATA:  Back pain EXAM: CT THORACIC SPINE WITHOUT CONTRAST TECHNIQUE: Multidetector CT images of the thoracic were obtained using the standard protocol without intravenous contrast. RADIATION DOSE REDUCTION: This exam was performed according to the departmental dose-optimization program which includes automated exposure control, adjustment of the mA and/or kV according to patient size and/or use of iterative reconstruction technique. COMPARISON:  None Available. FINDINGS: Alignment: Dextroscoliosis. Sagittal alignment within normal limits. No subluxation Vertebrae: No acute fracture or focal pathologic process. Paraspinal and other soft tissues: Negative. Disc levels: Multilevel degenerative osteophytes. No abnormal disc space narrowing or widening. IMPRESSION: Dextroscoliosis with mild  degenerative change. No acute osseous abnormality. Electronically Signed   By: Jasmine Pang M.D.   On: 07/16/2023 18:54   CT Angio Chest PE W and/or Wo Contrast Result Date: 07/16/2023 CLINICAL DATA:  Pain and tenderness across the middle of the back for the past week, worse with a deep breath. History of shingles. Clinical concern pulmonary embolism. EXAM: CT ANGIOGRAPHY CHEST WITH CONTRAST TECHNIQUE: Multidetector CT imaging of the chest was performed using the standard protocol during bolus administration of intravenous contrast. Multiplanar CT image reconstructions and MIPs were obtained to evaluate the vascular anatomy. RADIATION DOSE REDUCTION: This exam was performed according to the departmental dose-optimization program which includes automated exposure control, adjustment of the mA and/or kV according to patient size and/or use of iterative reconstruction technique. CONTRAST:  OMNIPAQUE IOHEXOL 350 MG/ML SOLN COMPARISON:  Chest and bilateral rib radiographs  dated 07/11/2023. FINDINGS: Cardiovascular: Mild atheromatous aortic calcifications. Normally opacified pulmonary arteries with no pulmonary arterial filling defects seen. Borderline enlarged heart. No pericardial effusion. Mediastinum/Nodes: No enlarged mediastinal, hilar, or axillary lymph nodes. Thyroid gland, trachea, and esophagus demonstrate no significant findings. Lungs/Pleura: Lungs are clear. No pleural effusion or pneumothorax. Upper Abdomen: Mild diffuse low-density of the liver. Musculoskeletal: Thoracic and lower cervical spine degenerative changes and mild scoliosis. Review of the MIP images confirms the above findings. IMPRESSION: 1. No pulmonary embolism or other acute abnormality. 2. Borderline cardiomegaly. 3. Mild hepatic steatosis. 4. Aortic atherosclerosis. Aortic Atherosclerosis (ICD10-I70.0). Electronically Signed   By: Beckie Salts M.D.   On: 07/16/2023 18:20   DG Thoracic Spine 2 View Result Date: 07/11/2023 CLINICAL  DATA:  Mid back pain for several days without known injury. EXAM: THORACIC SPINE 2 VIEWS COMPARISON:  None Available. FINDINGS: There is no evidence of thoracic spine fracture. Alignment is normal. Mild osteophyte formation is noted at multiple levels in the mid and lower thoracic spine. IMPRESSION: Multilevel degenerative changes as noted above. No acute abnormality seen. Electronically Signed   By: Lupita Raider M.D.   On: 07/11/2023 12:05   DG Ribs Bilateral W/Chest Result Date: 07/11/2023 CLINICAL DATA:  Atypical chest and back pain for several days without known injury. EXAM: BILATERAL RIBS AND CHEST - 4+ VIEW COMPARISON:  None Available. FINDINGS: No fracture or other bone lesions are seen involving the ribs. There is no evidence of pneumothorax or pleural effusion. Both lungs are clear. Heart size and mediastinal contours are within normal limits. IMPRESSION: Negative. Electronically Signed   By: Lupita Raider M.D.   On: 07/11/2023 12:03    Microbiology: Results for orders placed or performed during the hospital encounter of 07/16/23  Blood culture (routine x 2)     Status: Abnormal   Collection Time: 07/16/23  8:14 PM   Specimen: Right Antecubital; Blood  Result Value Ref Range Status   Specimen Description   Final    RIGHT ANTECUBITAL BLOOD Performed at [facility], [address], [place], [place] [address]    Special Requests   Final    BOTTLES DRAWN AEROBIC AND ANAEROBIC Blood Culture adequate volume Performed at [facility], [address] [place], [place] [address]    Culture  Setup Time   Final    GRAM POSITIVE COCCI AEROBIC BOTTLE ONLY CRITICAL RESULT CALLED TO, READ BACK BY AND VERIFIED WITH: PHARMD J LEDFORD 07/18/2023 @ 0151 BY AB Performed at [facility], [address], [place], [place] [address]    Culture STREPTOCOCCUS ANGINOSIS (A)  Final   Report Status 07/20/2023 FINAL  Final   Organism ID, Bacteria STREPTOCOCCUS ANGINOSIS  Final       Susceptibility   Streptococcus anginosis - MIC*    PENICILLIN <=0.06 SENSITIVE Sensitive     CEFTRIAXONE <=0.12 SENSITIVE Sensitive     ERYTHROMYCIN <=0.12 SENSITIVE Sensitive     LEVOFLOXACIN 0.5 SENSITIVE Sensitive     VANCOMYCIN 0.5 SENSITIVE Sensitive     * STREPTOCOCCUS ANGINOSIS  Blood culture (routine x 2)     Status: Abnormal   Collection Time: 07/16/23  8:14 PM   Specimen: Left Antecubital; Blood  Result Value Ref Range Status   Specimen Description   Final    LEFT ANTECUBITAL Performed at [facility], [address] , [place] [address]    Special Requests   Final    BOTTLES  DRAWN AEROBIC AND ANAEROBIC Blood Culture adequate volume Performed at [facility], [address] [place], [place] [address]    Culture  Setup Time   Final    GRAM POSITIVE COCCI AEROBIC BOTTLE ONLY CRITICAL RESULT CALLED TO, READ BACK BY AND VERIFIED WITH: PHARMD ANDREW MEYER ON 07/18/23 @ 2021 BY DRT    Culture (A)  Final    STAPHYLOCOCCUS CAPITIS THE SIGNIFICANCE OF ISOLATING THIS ORGANISM FROM A SINGLE SET OF BLOOD CULTURES WHEN MULTIPLE SETS ARE DRAWN IS UNCERTAIN. PLEASE NOTIFY THE MICROBIOLOGY DEPARTMENT WITHIN ONE WEEK IF SPECIATION AND SENSITIVITIES ARE REQUIRED. Performed at [facility], [address] [place], [place] [address]    Report Status 07/22/2023 FINAL  Final  Blood Culture ID Panel (Reflexed)     Status: Abnormal   Collection Time: 07/16/23  8:14 PM  Result Value Ref Range Status   Enterococcus faecalis NOT DETECTED NOT DETECTED Final   Enterococcus Faecium NOT DETECTED NOT DETECTED Final   Listeria monocytogenes NOT DETECTED NOT DETECTED Final   Staphylococcus species NOT DETECTED NOT DETECTED Final   Staphylococcus aureus (BCID) NOT DETECTED NOT DETECTED Final   Staphylococcus epidermidis NOT DETECTED NOT DETECTED Final   Staphylococcus lugdunensis NOT DETECTED NOT DETECTED Final   Streptococcus species DETECTED (A) NOT DETECTED  Final    Comment: Not Enterococcus species, Streptococcus agalactiae, Streptococcus pyogenes, or Streptococcus pneumoniae. CRITICAL RESULT CALLED TO, READ BACK BY AND VERIFIED WITH: PHARMD J LEDFORD 07/18/2023 @ 0151 BY AB    Streptococcus agalactiae NOT DETECTED NOT DETECTED Final   Streptococcus pneumoniae NOT DETECTED NOT DETECTED Final   Streptococcus pyogenes NOT DETECTED NOT DETECTED Final   A.calcoaceticus-baumannii NOT DETECTED NOT DETECTED Final   Bacteroides fragilis NOT DETECTED NOT DETECTED Final   Enterobacterales NOT DETECTED NOT DETECTED Final   Enterobacter cloacae complex NOT DETECTED NOT DETECTED Final   Escherichia coli NOT DETECTED NOT DETECTED Final   Klebsiella aerogenes NOT DETECTED NOT DETECTED Final   Klebsiella oxytoca NOT DETECTED NOT DETECTED Final   Klebsiella pneumoniae NOT DETECTED NOT DETECTED Final   Proteus species NOT DETECTED NOT DETECTED Final   Salmonella species NOT DETECTED NOT DETECTED Final   Serratia marcescens NOT DETECTED NOT DETECTED Final   Haemophilus influenzae NOT DETECTED NOT DETECTED Final   Neisseria meningitidis NOT DETECTED NOT DETECTED Final   Pseudomonas aeruginosa NOT DETECTED NOT DETECTED Final   Stenotrophomonas maltophilia NOT DETECTED NOT DETECTED Final   Candida albicans NOT DETECTED NOT DETECTED Final   Candida auris NOT DETECTED NOT DETECTED Final   Candida glabrata NOT DETECTED NOT DETECTED Final   Candida krusei NOT DETECTED NOT DETECTED Final   Candida parapsilosis NOT DETECTED NOT DETECTED Final   Candida tropicalis NOT DETECTED NOT DETECTED Final   Cryptococcus neoformans/gattii NOT DETECTED NOT DETECTED Final    Comment: Performed at [facility], [address] [place], [place] [address]  Surgical pcr screen     Status: Abnormal   Collection Time: 07/18/23  3:44 AM   Specimen: Nasal Mucosa; Nasal Swab  Result Value Ref Range Status   MRSA, PCR NEGATIVE NEGATIVE Final   Staphylococcus aureus POSITIVE (A)  NEGATIVE Final    Comment: (NOTE) The Xpert SA Assay (FDA approved for NASAL specimens in patients 24 years of age and older), is one component of a comprehensive surveillance program. It is not intended to diagnose infection nor to guide or monitor treatment. Performed at [facility], [address] [place], [place] [address]   Blood Culture ID Panel (Reflexed)     Status: Abnormal   Collection Time: 07/18/23  8:14 PM  Result Value Ref Range Status   Enterococcus faecalis NOT DETECTED NOT DETECTED Final   Enterococcus Faecium NOT DETECTED NOT DETECTED Final   Listeria monocytogenes NOT DETECTED NOT DETECTED Final   Staphylococcus species DETECTED (A) NOT DETECTED Final    Comment: CRITICAL RESULT CALLED TO, READ BACK BY AND VERIFIED WITH: PHARMD ANDREW MEYER ON 07/18/23 @ 2038 BY DRT    Staphylococcus aureus (BCID) NOT DETECTED NOT DETECTED Final   Staphylococcus epidermidis NOT DETECTED NOT DETECTED Final   Staphylococcus lugdunensis NOT DETECTED NOT DETECTED Final   Streptococcus species NOT DETECTED NOT DETECTED Final   Streptococcus agalactiae NOT DETECTED NOT DETECTED Corrected    Comment: CORRECTED ON 01/10 AT 0815: PREVIOUSLY REPORTED AS NOT DETECTED CRITICAL RESULT CALLED TO, READ BACK BY AND VERIFIED WITH: PHARMD ANDREW MEYER ON 07/18/23 @ 2038 BY DRT   Streptococcus pneumoniae NOT DETECTED NOT DETECTED Final   Streptococcus pyogenes NOT DETECTED NOT DETECTED Final   A.calcoaceticus-baumannii NOT DETECTED NOT DETECTED Final   Bacteroides fragilis NOT DETECTED NOT DETECTED Final   Enterobacterales NOT DETECTED NOT DETECTED Final   Enterobacter cloacae complex NOT DETECTED NOT DETECTED Final   Escherichia coli NOT DETECTED NOT DETECTED Final   Klebsiella aerogenes NOT DETECTED NOT DETECTED Final   Klebsiella oxytoca NOT DETECTED NOT DETECTED Final   Klebsiella pneumoniae NOT DETECTED NOT DETECTED Final   Proteus species NOT DETECTED NOT DETECTED Final   Salmonella  species NOT DETECTED NOT DETECTED Final   Serratia marcescens NOT DETECTED NOT DETECTED Final   Haemophilus influenzae NOT DETECTED NOT DETECTED Final   Neisseria meningitidis NOT DETECTED NOT DETECTED Final   Pseudomonas aeruginosa NOT DETECTED NOT DETECTED Final   Stenotrophomonas maltophilia NOT DETECTED NOT DETECTED Final   Candida albicans NOT DETECTED NOT DETECTED Final   Candida auris NOT DETECTED NOT DETECTED Final   Candida glabrata NOT DETECTED NOT DETECTED Final   Candida krusei NOT DETECTED NOT DETECTED Final   Candida parapsilosis NOT DETECTED NOT DETECTED Final   Candida tropicalis NOT DETECTED NOT DETECTED Final   Cryptococcus neoformans/gattii NOT DETECTED NOT DETECTED Final    Comment: Performed at [facility], [address], [place], [place] [address]  Culture, blood (Routine X 2) w Reflex to ID Panel     Status: None   Collection Time: 07/20/23  4:56 AM   Specimen: BLOOD  Result Value Ref Range Status   Specimen Description BLOOD BLOOD RIGHT HAND  Final   Special Requests   Final    BOTTLES DRAWN AEROBIC AND ANAEROBIC Blood Culture results may not be optimal due to an inadequate volume of blood received in culture bottles   Culture   Final    NO GROWTH 5 DAYS Performed at [facility], [address], [place], [place] [address]    Report Status 07/25/2023 FINAL  Final  Culture, blood (Routine X 2) w Reflex to ID Panel     Status: None   Collection Time: 07/20/23  4:57 AM   Specimen: BLOOD  Result Value Ref Range Status   Specimen Description BLOOD BLOOD RIGHT HAND  Final   Special Requests   Final    BOTTLES DRAWN AEROBIC AND ANAEROBIC Blood Culture results may not be optimal due to an inadequate volume of blood received in culture bottles   Culture  Final    NO GROWTH 5 DAYS Performed at [facility], [address], [place], [place] [address]    Report Status 07/25/2023 FINAL  Final    Labs: CBC: Recent Labs  Lab 07/21/23 0602  07/23/23 0602 07/25/23 1452 07/26/23 0725  WBC 9.2 10.5 10.9* 10.5  HGB 12.3 12.7 12.6 13.0  HCT 39.8 39.8 39.3 41.1  MCV 87.1 86.0 86.9 86.9  PLT 394 361 364 381   Basic Metabolic Panel: Recent Labs  Lab 07/23/23 0602 07/24/23 0846 07/24/23 1700 07/25/23 0551 07/26/23 0725 07/27/23 0512  NA 142 146* 139 139 138 136  K 3.9 4.3  --  3.4* 3.9 3.8  CL 105 107  --  101 103 100  CO2 27 27  --  28 26 25   GLUCOSE 133* 127*  --  125* 128* 136*  BUN <5* 7*  --  13 7* 8  CREATININE 0.84 1.39*  --  1.02* 0.96 0.94  CALCIUM 8.4* 8.1*  --  8.1* 8.2* 8.3*  MG  --   --   --  1.9  --   --    Liver Function Tests: Recent Labs  Lab 07/23/23 0602 07/24/23 0846 07/25/23 0946 07/26/23 0725 07/27/23 0512  AST 24 18 17 16 16   ALT 23 21 19 17 15   ALKPHOS 89 96 82 92 91  BILITOT 0.5 0.5 0.5 0.7 0.7  PROT 5.8* 5.8* 5.7* 6.2* 6.0*  ALBUMIN 2.3* 2.2* 2.3* 2.5* 2.5*   CBG: Recent Labs  Lab 07/26/23 1141 07/26/23 1710 07/26/23 2037 07/27/23 0753 07/27/23 1207  GLUCAP 127* 134* 139* 126* 181*    Discharge time spent: 35 minutes.  Signed: Jacquelin Hawking, MD [facility] 07/27/2023

## 2023-07-27 NOTE — Progress Notes (Signed)
Daily Progress Note  DOA: 07/16/2023 Hospital Day: 12   Chief Complaint: post-op bile duct leak  ASSESSMENT    Brief Narrative:  Tricia Dodson is a 73 y.o. year old female with a history of  HTN, OSA on CPAP, obesity. Admitted with acute cholecystitis, s/p subtotal cholecystectomy 1/9. GI consulted 1/10 for bile leak    Acute gangrenous cholecystitis s/p subtotal cholecystectomy 1/9   Post-op bile duct leak s/p ERCP with sphincterotomy, removal of sludge from CBD, plastic stent placement to pancreatic duct and CBD.  Today:     LFTs remain normal. Decreasing drain output. No abdominal pain .   Principal Problem:   Acute cholecystitis Active Problems:   CPAP (continuous positive airway pressure) dependence   Hypertension   Cholecystitis, acute   AKI (acute kidney injury) (HCC)   Bile leak, postoperative   Gastritis and gastroduodenitis   PLAN   Agree with discharge today. May take days or weeks for bile duct leak to heal. If drainage continues to improve she will need an ERCP in 2 months to see if the CBD stent can be removed. In the interim if drain output increases over next few days then will need sooner ERCP for placement of a covered metal stent.    Subjective   No abdominal pain. She is packing up to go home today  Objective   Gastric biopsies:  Gastritis ( Endoscopic finding at time of ERCP but biopsies show unremarkable gastric oxynic mucosa and no H.pylori).   Daily drain output 1/16  >> 230 ml   1/17 >> 135 Today so far there is ~ 100 ml of greenish yellow output    Recent Labs    07/25/23 1452 07/26/23 0725  WBC 10.9* 10.5  HGB 12.6 13.0  HCT 39.3 41.1  PLT 364 381   BMET Recent Labs    07/25/23 0551 07/26/23 0725 07/27/23 0512  NA 139 138 136  K 3.4* 3.9 3.8  CL 101 103 100  CO2 28 26 25   GLUCOSE 125* 128* 136*  BUN 13 7* 8  CREATININE 1.02* 0.96 0.94  CALCIUM 8.1* 8.2* 8.3*   LFT Recent Labs    07/25/23 0946  07/26/23 0725 07/27/23 0512  PROT 5.7*   < > 6.0*  ALBUMIN 2.3*   < > 2.5*  AST 17   < > 16  ALT 19   < > 15  ALKPHOS 82   < > 91  BILITOT 0.5   < > 0.7  BILIDIR <0.1  --   --   IBILI NOT CALCULATED  --   --    < > = values in this interval not displayed.   PT/INR No results for input(s): "LABPROT", "INR" in the last 72 hours.   Imaging:  DG Abd 2 Views CLINICAL DATA:  Biliary stent insertion, bile leak.  EXAM: ABDOMEN - 2 VIEW  COMPARISON:  ERCP images from 07/23/2023  FINDINGS: The biliary stent remains in place. There is also a tube/drain projecting along the inferior margin of the right hepatic lobe and terminating in the porta hepatis.  I do not see the temporary ventral pancreatic duct stent in the expected position. I do see a small linear structure which may represent the original pancreatic duct stent projecting to the left of the spine at L4-5, possibly representing the stent transiting in the bowel.  Blunting of the right costophrenic angle potentially from atelectasis or a trace pleural effusion. Thoracolumbar spondylosis.  IMPRESSION: 1.  The biliary stent remains in place. 2. The temporary ventral pancreatic duct stent is not seen in the expected position. I do see a small linear structure which may represent the original pancreatic duct stent projecting to the left of the spine at L4-5, possibly representing the stent transiting in the bowel. 3. Blunting of the right costophrenic angle potentially from atelectasis or a trace pleural effusion.  Electronically Signed   By: Gaylyn Rong M.D.   On: 07/25/2023 18:20     Scheduled inpatient medications:   amitriptyline  10 mg Oral QHS   atorvastatin  10 mg Oral Daily   DULoxetine  20 mg Oral Daily   feeding supplement  237 mL Oral TID BM   gabapentin  300 mg Oral BID   insulin aspart  0-15 Units Subcutaneous TID WC   insulin aspart  0-5 Units Subcutaneous QHS   levothyroxine  25 mcg Oral  Q0600   multivitamin with minerals  1 tablet Oral Daily   pantoprazole  40 mg Oral Daily   senna-docusate  2 tablet Oral BID   sodium chloride flush  3 mL Intravenous Q12H   Continuous inpatient infusions:   ampicillin-sulbactam (UNASYN) IV 3 g (07/27/23 1302)   PRN inpatient medications: acetaminophen, HYDROmorphone (DILAUDID) injection, ipratropium-albuterol, melatonin, ondansetron (ZOFRAN) IV, mouth rinse, oxyCODONE, polyethylene glycol  Vital signs in last 24 hours: Temp:  [98.4 F (36.9 C)-99.1 F (37.3 C)] 99.1 F (37.3 C) (01/18 0745) Pulse Rate:  [88-96] 96 (01/18 0745) Resp:  [14-18] 16 (01/18 0745) BP: (127-139)/(71-80) 131/80 (01/18 0745) SpO2:  [91 %-100 %] 94 % (01/18 0745) Last BM Date : 07/26/23  Intake/Output Summary (Last 24 hours) at 07/27/2023 1355 Last data filed at 07/27/2023 1059 Gross per 24 hour  Intake 2703 ml  Output 115 ml  Net 2588 ml    Intake/Output from previous day: 01/17 0701 - 01/18 0700 In: 2700 [IV Piggyback:2700] Out: 115 [Drains:115] Intake/Output this shift: Total I/O In: 3 [I.V.:3] Out: -    Physical Exam:  General: Alert female in NAD Heart:  Regular rate and rhythm.  Pulmonary: Normal respiratory effort Abdomen: Soft, nondistended, nontender. Normal bowel sounds.There is ~ 100 ml of greenish yellow JP drain output Neurologic: Alert and oriented Psych: Pleasant. Cooperative. Insight appears normal.      LOS: 9 days   Willette Cluster ,NP 07/27/2023, 1:55 PM

## 2023-07-27 NOTE — TOC Transition Note (Signed)
Transition of Care [facility]) - Discharge Note   Patient Details  Name: Tricia Dodson MRN: [id number] Date of Birth: 20-Apr-1951  [facility] CM/SW Contact:  Ronny Bacon, RN Phone Number: 07/27/2023, 3:22 PM   Clinical Narrative:   Patient is being discharged today. Katina with [facility] made aware.    Final next level of care: Home w Home Health Services Barriers to Discharge: No Barriers Identified   Patient Goals and CMS Choice Patient states their goals for this hospitalization and ongoing recovery are:: to return to home          Discharge Placement                       Discharge Plan and Services Additional resources added to the After Visit Summary for     Discharge Planning Services: CM Consult Post Acute Care Choice: NA          DME Arranged: N/A DME Agency: NA       HH Arranged: NA HH Agency: NA        Social Drivers of Health (SDOH) Interventions SDOH Screenings   Food Insecurity: Unknown (07/17/2023)  Housing: Low Risk  (07/17/2023)  Transportation Needs: No Transportation Needs (07/17/2023)  Utilities: Not At Risk (07/17/2023)  Alcohol Screen: Low Risk  (07/10/2023)  Depression (PHQ2-9): Low Risk  (06/18/2023)  Financial Resource Strain: Patient Declined (07/10/2023)  Physical Activity: Insufficiently Active (07/10/2023)  Social Connections: Socially Isolated (07/17/2023)  Stress: No Stress Concern Present (07/10/2023)  Tobacco Use: Medium Risk (07/23/2023)     Readmission Risk Interventions     No data to display

## 2023-07-27 NOTE — Plan of Care (Signed)

## 2023-07-27 NOTE — Progress Notes (Signed)
4 Days Post-Op   Subjective/Chief Complaint: Complains of back pain radiating down right leg. Feels like she would be more comfortable at home   Objective: Vital signs in last 24 hours: Temp:  [98.4 F (36.9 C)-99.1 F (37.3 C)] 99.1 F (37.3 C) (01/18 0745) Pulse Rate:  [88-96] 96 (01/18 0745) Resp:  [14-18] 16 (01/18 0745) BP: (127-139)/(71-80) 131/80 (01/18 0745) SpO2:  [91 %-100 %] 94 % (01/18 0745) Last BM Date : 07/26/23  Intake/Output from previous day: 01/17 0701 - 01/18 0700 In: 2700 [IV Piggyback:2700] Out: 115 [Drains:115] Intake/Output this shift: No intake/output data recorded.  General appearance: alert and cooperative Resp: clear to auscultation bilaterally Cardio: regular rate and rhythm GI: soft, mild tenderness  Lab Results:  Recent Labs    07/25/23 1452 07/26/23 0725  WBC 10.9* 10.5  HGB 12.6 13.0  HCT 39.3 41.1  PLT 364 381   BMET Recent Labs    07/26/23 0725 07/27/23 0512  NA 138 136  K 3.9 3.8  CL 103 100  CO2 26 25  GLUCOSE 128* 136*  BUN 7* 8  CREATININE 0.96 0.94  CALCIUM 8.2* 8.3*   PT/INR No results for input(s): "LABPROT", "INR" in the last 72 hours. ABG No results for input(s): "PHART", "HCO3" in the last 72 hours.  Invalid input(s): "PCO2", "PO2"  Studies/Results: DG Abd 2 Views Result Date: 07/25/2023 CLINICAL DATA:  Biliary stent insertion, bile leak. EXAM: ABDOMEN - 2 VIEW COMPARISON:  ERCP images from 07/23/2023 FINDINGS: The biliary stent remains in place. There is also a tube/drain projecting along the inferior margin of the right hepatic lobe and terminating in the porta hepatis. I do not see the temporary ventral pancreatic duct stent in the expected position. I do see a small linear structure which may represent the original pancreatic duct stent projecting to the left of the spine at L4-5, possibly representing the stent transiting in the bowel. Blunting of the right costophrenic angle potentially from atelectasis  or a trace pleural effusion. Thoracolumbar spondylosis. IMPRESSION: 1. The biliary stent remains in place. 2. The temporary ventral pancreatic duct stent is not seen in the expected position. I do see a small linear structure which may represent the original pancreatic duct stent projecting to the left of the spine at L4-5, possibly representing the stent transiting in the bowel. 3. Blunting of the right costophrenic angle potentially from atelectasis or a trace pleural effusion. Electronically Signed   By: Gaylyn Rong M.D.   On: 07/25/2023 18:20    Anti-infectives: Anti-infectives (From admission, onward)    Start     Dose/Rate Route Frequency Ordered Stop   07/19/23 1800  Ampicillin-Sulbactam (UNASYN) 3 g in sodium chloride 0.9 % 100 mL IVPB        3 g 200 mL/hr over 30 Minutes Intravenous Every 6 hours 07/19/23 1335 08/13/23 2359   07/18/23 0830  ceFAZolin (ANCEF) 3 g in sodium chloride 0.9 % 100 mL IVPB  Status:  Discontinued        3 g 200 mL/hr over 30 Minutes Intravenous On call to O.R. 07/18/23 0818 07/18/23 1134   07/18/23 0814  sodium chloride 0.9 % with ceFAZolin (ANCEF) ADS Med       Note to Pharmacy: Garen Lah E: cabinet override      07/18/23 0814 07/18/23 2029   07/17/23 1430  ceFAZolin (ANCEF) 3 g in sodium chloride 0.9 % 100 mL IVPB  Status:  Discontinued        3  g 200 mL/hr over 30 Minutes Intravenous On call to O.R. 07/17/23 1352 07/17/23 1822   07/17/23 1403  sodium chloride 0.9 % with ceFAZolin (ANCEF) ADS Med  Status:  Discontinued       Note to Pharmacy: Launa Flight M: cabinet override      07/17/23 1403 07/17/23 1741   07/17/23 0800  piperacillin-tazobactam (ZOSYN) IVPB 3.375 g  Status:  Discontinued        3.375 g 12.5 mL/hr over 240 Minutes Intravenous Every 8 hours 07/17/23 0759 07/19/23 1335   07/16/23 1945  piperacillin-tazobactam (ZOSYN) IVPB 3.375 g        3.375 g 100 mL/hr over 30 Minutes Intravenous  Once 07/16/23 1934 07/16/23 2228        Assessment/Plan: s/p Procedure(s): ENDOSCOPIC RETROGRADE CHOLANGIOPANCREATOGRAPHY (ERCP) WITH PROPOFOL (N/A) SPHINCTEROTOMY BILIARY STENT PLACEMENT PANCREATIC STENT PLACEMENT REMOVAL OF SLUDGE Advance diet  Acute gangrenous cholecystitis POD#8 s/p subtotal cholecystectomy 1/9 Dr. Hillery Hunter    ERCP, difficult, 07/23/23 - +bile leak, successfully stented. Drain became SS immediately after but is now frank bile again   AFVSS, creatinine improved WBC and lft's normal GI following.  Noted plans for further observation at present, they had offered upsizing the covered bare-metal stent.  Agree, we will continue to follow.  Some of these do in fact close over the ensuing days to week.  If output remains elevated and bilious, I think it would be reasonable to consider a larger covered stent.   JP to bulb suction - output 230 ml/24h--> 135 cc, 145cc bilious stable   FEN: Diet per GI ID: Unasyn VTE: SCDs; holding chemical dvt ppx for 48 hrs post ERCP (can restart this evening) OK for d/c when stable from GI and medical standpoint  LOS: 9 days    Chevis Pretty III 07/27/2023

## 2023-07-29 ENCOUNTER — Other Ambulatory Visit: Payer: Self-pay

## 2023-07-29 ENCOUNTER — Telehealth: Payer: Self-pay

## 2023-07-29 DIAGNOSIS — T85528A Displacement of other gastrointestinal prosthetic devices, implants and grafts, initial encounter: Secondary | ICD-10-CM

## 2023-07-29 NOTE — Transitions of Care (Post Inpatient/ED Visit) (Unsigned)
   07/29/2023  Name: Tricia Dodson MRN: [id number] DOB: June 17, 1951  Today's TOC FU Call Status: Today's TOC FU Call Status:: Unsuccessful Call (1st Attempt) Unsuccessful Call (1st Attempt) Date: 07/29/23  Attempted to reach the patient regarding the most recent Inpatient/ED visit.  Follow Up Plan: Additional outreach attempts will be made to reach the patient to complete the Transitions of Care (Post Inpatient/ED visit) call.   Signature Karena Addison, LPN [facility] Nurse Health Advisor Direct Dial [phone]

## 2023-07-29 NOTE — Telephone Encounter (Signed)
ERCP has been set up for 10/07/23 at 930 am at [facility] with GM

## 2023-07-29 NOTE — Transitions of Care (Post Inpatient/ED Visit) (Signed)
   07/29/2023  Name: Tricia Dodson MRN: [id number] DOB: [birth year]  Today's TOC FU Call Status: Today's TOC FU Call Status:: Unsuccessful Call (1st Attempt) Unsuccessful Call (1st Attempt) Date: 07/29/23  Attempted to reach the patient regarding the most recent Inpatient/ED visit.  Follow Up Plan: Additional outreach attempts will be made to reach the patient to complete the Transitions of Care (Post Inpatient/ED visit) call.   Jodelle Gross RN, BSN, CCM RN Care Manager  Transitions of Care  [facility] - [facility]  [phone]

## 2023-07-30 ENCOUNTER — Telehealth: Payer: Self-pay

## 2023-07-30 NOTE — Transitions of Care (Post Inpatient/ED Visit) (Signed)
   07/30/2023  Name: Faelyn Speer MRN: [id number] DOB: 1950/09/04  Today's TOC FU Call Status: Today's TOC FU Call Status:: Unsuccessful Call (2nd Attempt) Unsuccessful Call (2nd Attempt) Date: 07/30/23  Attempted to reach the patient regarding the most recent Inpatient/ED visit.  Follow Up Plan: Additional outreach attempts will be made to reach the patient to complete the Transitions of Care (Post Inpatient/ED visit) call.   Jodelle Gross RN, BSN, CCM RN Care Manager  Transitions of Care  [facility] - [facility]  [phone]

## 2023-07-30 NOTE — Telephone Encounter (Signed)
Left message on machine to call back  

## 2023-07-31 ENCOUNTER — Telehealth: Payer: Self-pay

## 2023-07-31 ENCOUNTER — Other Ambulatory Visit: Payer: Self-pay | Admitting: Family

## 2023-07-31 NOTE — Telephone Encounter (Signed)
Left message on machine to call back  

## 2023-07-31 NOTE — Transitions of Care (Post Inpatient/ED Visit) (Unsigned)
   07/31/2023  Name: Tricia Dodson MRN: [id number] DOB: [birth year]  Today's TOC FU Call Status: Today's TOC FU Call Status:: Unsuccessful Call (2nd Attempt) Unsuccessful Call (1st Attempt) Date: 07/29/23 Unsuccessful Call (2nd Attempt) Date: 07/31/23  Attempted to reach the patient regarding the most recent Inpatient/ED visit.  Follow Up Plan: Additional outreach attempts will be made to reach the patient to complete the Transitions of Care (Post Inpatient/ED visit) call.   Signature Karena Addison, LPN [facility] Nurse Health Advisor Direct Dial [phone]

## 2023-07-31 NOTE — Transitions of Care (Post Inpatient/ED Visit) (Signed)
   07/31/2023  Name: Keara Bebout MRN: [id number] DOB: 17-Oct-1950  Today's TOC FU Call Status: Today's TOC FU Call Status:: Unsuccessful Call (3rd Attempt) Unsuccessful Call (3rd Attempt) Date: 07/31/23  Attempted to reach the patient regarding the most recent Inpatient/ED visit. TOC 30 day follow up outreach attempt #3. Unsuccessful. Left confidential HiPAA compliant voice mail per DPR.   Follow Up Plan: No further outreach attempts will be made at this time. We have been unable to contact the patient.   Gabriel Cirri MSN, RN RN Case Sales executive Health   Population Health Office Hours Wed/Thur  8:00 am-6:00 pm Direct Dial: [phone] Main Phone [phone]  Fax: [phone]

## 2023-08-01 ENCOUNTER — Other Ambulatory Visit: Payer: Self-pay | Admitting: Family

## 2023-08-01 NOTE — Telephone Encounter (Signed)
ERCP scheduled, pt instructed and medications reviewed.  Patient instructions mailed to home.  Patient to call with any questions or concerns.  

## 2023-08-01 NOTE — Telephone Encounter (Signed)
I have been unable to reach the pt by phone- I have mailed her the information and sent to My Chart. She does view messages

## 2023-08-01 NOTE — Transitions of Care (Post Inpatient/ED Visit) (Signed)
   08/01/2023  Name: Tricia Dodson MRN: [id number] DOB: 14-Oct-1950  Today's TOC FU Call Status: Today's TOC FU Call Status:: Unsuccessful Call (3rd Attempt) Unsuccessful Call (1st Attempt) Date: 07/29/23 Unsuccessful Call (2nd Attempt) Date: 07/31/23 Unsuccessful Call (3rd Attempt) Date: 08/01/23  Attempted to reach the patient regarding the most recent Inpatient/ED visit.  Follow Up Plan: No further outreach attempts will be made at this time. We have been unable to contact the patient.  Signature Karena Addison, LPN [facility] Nurse Health Advisor Direct Dial [phone]

## 2023-08-04 ENCOUNTER — Other Ambulatory Visit: Payer: Self-pay | Admitting: Family

## 2023-08-06 ENCOUNTER — Other Ambulatory Visit: Payer: Self-pay | Admitting: Family

## 2023-08-08 ENCOUNTER — Encounter: Payer: Self-pay | Admitting: Family

## 2023-08-08 ENCOUNTER — Encounter (HOSPITAL_BASED_OUTPATIENT_CLINIC_OR_DEPARTMENT_OTHER): Payer: Self-pay

## 2023-08-08 ENCOUNTER — Ambulatory Visit: Payer: Medicare Other | Admitting: Family

## 2023-08-08 ENCOUNTER — Ambulatory Visit (HOSPITAL_BASED_OUTPATIENT_CLINIC_OR_DEPARTMENT_OTHER)
Admission: RE | Admit: 2023-08-08 | Discharge: 2023-08-08 | Disposition: A | Discharge status: Home / Self Care | Payer: Medicare Other | Source: Ambulatory Visit | Attending: Family | Admitting: Family

## 2023-08-08 ENCOUNTER — Other Ambulatory Visit: Payer: Self-pay | Admitting: Family

## 2023-08-08 VITALS — BP 96/62 | HR 71 | Ht 67.0 in | Wt 241.8 lb

## 2023-08-08 DIAGNOSIS — I700 Atherosclerosis of aorta: Secondary | ICD-10-CM | POA: Diagnosis not present

## 2023-08-08 DIAGNOSIS — R0602 Shortness of breath: Secondary | ICD-10-CM

## 2023-08-08 DIAGNOSIS — I517 Cardiomegaly: Secondary | ICD-10-CM | POA: Diagnosis not present

## 2023-08-08 DIAGNOSIS — R918 Other nonspecific abnormal finding of lung field: Secondary | ICD-10-CM | POA: Diagnosis not present

## 2023-08-08 DIAGNOSIS — R109 Unspecified abdominal pain: Secondary | ICD-10-CM

## 2023-08-08 DIAGNOSIS — I878 Other specified disorders of veins: Secondary | ICD-10-CM | POA: Diagnosis not present

## 2023-08-08 LAB — COMPREHENSIVE METABOLIC PANEL
ALT: 14 U/L (ref 0–35)
AST: 17 U/L (ref 0–37)
Albumin: 3.8 g/dL (ref 3.5–5.2)
Alkaline Phosphatase: 76 U/L (ref 39–117)
BUN: 13 mg/dL (ref 6–23)
CO2: 29 meq/L (ref 19–32)
Calcium: 9.1 mg/dL (ref 8.4–10.5)
Chloride: 102 meq/L (ref 96–112)
Creatinine, Ser: 0.95 mg/dL (ref 0.40–1.20)
GFR: 59.86 mL/min — ABNORMAL LOW (ref >60.00)
Glucose, Bld: 114 mg/dL — ABNORMAL HIGH (ref 70–99)
Potassium: 4.4 meq/L (ref 3.5–5.1)
Sodium: 139 meq/L (ref 135–145)
Total Bilirubin: 0.4 mg/dL (ref 0.2–1.2)
Total Protein: 7.3 g/dL (ref 6.0–8.3)

## 2023-08-08 LAB — CBC WITH DIFFERENTIAL/PLATELET
Basophils Absolute: 0.1 10*3/uL (ref 0.0–0.1)
Basophils Relative: 1.2 % (ref 0.0–3.0)
Eosinophils Absolute: 0.2 10*3/uL (ref 0.0–0.7)
Eosinophils Relative: 3.7 % (ref 0.0–5.0)
HCT: 40 % (ref 36.0–46.0)
Hemoglobin: 13 g/dL (ref 12.0–15.0)
Lymphocytes Relative: 28.8 % (ref 12.0–46.0)
Lymphs Abs: 1.6 10*3/uL (ref 0.7–4.0)
MCHC: 32.6 g/dL (ref 30.0–36.0)
MCV: 83.9 fL (ref 78.0–100.0)
Monocytes Absolute: 0.5 10*3/uL (ref 0.1–1.0)
Monocytes Relative: 9.1 % (ref 3.0–12.0)
Neutro Abs: 3.2 10*3/uL (ref 1.4–7.7)
Neutrophils Relative %: 57.2 % (ref 43.0–77.0)
Platelets: 405 10*3/uL — ABNORMAL HIGH (ref 150.0–400.0)
RBC: 4.76 Mil/uL (ref 3.87–5.11)
RDW: 14.3 % (ref 11.5–15.5)
WBC: 5.5 10*3/uL (ref 4.0–10.5)

## 2023-08-08 LAB — D-DIMER, QUANTITATIVE: D-Dimer, Quant: 3.36 ug{FEU}/mL — ABNORMAL HIGH (ref <0.50)

## 2023-08-08 MED ORDER — IOHEXOL 350 MG/ML SOLN
100.0000 mL | Freq: Once | INTRAVENOUS | Status: AC | PRN
  Administered 2023-08-08: 57 mL via INTRAVENOUS

## 2023-08-08 NOTE — Progress Notes (Signed)
Tricia Dodson is a 73 y.o. female with the following history as recorded in EpicCare:  Patient Active Problem List   Diagnosis Date Noted   Gastritis and gastroduodenitis 07/23/2023   Bile leak, postoperative 07/19/2023   Acute gangrenous cholecystitis 07/17/2023   Cholecystitis, acute 07/17/2023   AKI (acute kidney injury) (HCC) 07/17/2023   Primary osteoarthritis of both knees 04/18/2021   Dizziness 12/02/2020   Nonspecific abnormal electrocardiogram (ECG) (EKG) 12/02/2020   UTI (urinary tract infection)    Thyroid disease    Hypertension    Hyperlipidemia    HA (headache)    GERD (gastroesophageal reflux disease)    Colon polyps    Chicken pox    Arthritis    Allergy    Morbid obesity (HCC) 11/30/2020   Retrognathia 11/30/2020   CPAP (continuous positive airway pressure) dependence 11/30/2020   OSA on CPAP 11/30/2020   CPAP use counseling 11/04/2020   Carpal tunnel syndrome, bilateral 11/04/2020   Plantar fasciitis, bilateral 11/04/2020   Torn ligament 11/04/2020   History of vein stripping 11/04/2020   Shingles 2019    Current Outpatient Medications  Medication Sig Dispense Refill   amitriptyline (ELAVIL) 10 MG tablet Take 1 tablet (10 mg total) by mouth at bedtime. 90 tablet 3   amoxicillin-clavulanate (AUGMENTIN) 875-125 MG tablet Take 1 tablet by mouth 2 (two) times daily for 18 days. Stop date 08/13/23 36 tablet 0   DULoxetine (CYMBALTA) 20 MG capsule Take 1 capsule (20 mg total) by mouth daily. 90 capsule 0   empagliflozin (JARDIANCE) 10 MG TABS tablet Take 1 tablet (10 mg total) by mouth daily. 90 tablet 1   fenofibrate micronized (LOFIBRA) 134 MG capsule TAKE 1 CAPSULE BY MOUTH DAILY BEFORE BREAKFAST 90 capsule 3   gabapentin (NEURONTIN) 300 MG capsule Take 1 capsule (300 mg total) by mouth 2 (two) times daily. 180 capsule 1   ipratropium (ATROVENT) 0.03 % nasal spray Place 2 sprays into both nostrils every 12 (twelve) hours. (Patient taking differently: Place 2  sprays into both nostrils as needed for rhinitis.) 30 mL 0   levocetirizine (XYZAL) 5 MG tablet Take 1 tablet (5 mg total) by mouth every evening. 90 tablet 3   losartan (COZAAR) 50 MG tablet TAKE 1 TABLET(50 MG) BY MOUTH DAILY 90 tablet 1   omeprazole (PRILOSEC) 40 MG capsule TAKE 1 CAPSULE(40 MG) BY MOUTH TWICE DAILY 180 capsule 3   atorvastatin (LIPITOR) 10 MG tablet TAKE 1 TABLET(10 MG) BY MOUTH DAILY 90 tablet 3   cyclobenzaprine (FLEXERIL) 10 MG tablet Take 0.5-1 tablets (5-10 mg total) by mouth 3 (three) times daily as needed. (Patient not taking: Reported on 08/08/2023) 30 tablet 0   levothyroxine (SYNTHROID) 25 MCG tablet TAKE 1 TABLET(25 MCG) BY MOUTH DAILY 90 tablet 3   naproxen (NAPROSYN) 500 MG tablet Take 1 tablet (500 mg total) by mouth 2 (two) times daily with a meal. (Patient not taking: Reported on 08/08/2023) 30 tablet 0   No current facility-administered medications for this visit.    Allergies: Patient has no known allergies.  Past Medical History:  Diagnosis Date   Allergy    Arthritis    Carpal tunnel syndrome, bilateral 11/04/2020   Both wrists.    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   CPAP use counseling 11/04/2020   Diabetes mellitus without complication (HCC)    Type II   GERD (gastroesophageal reflux disease)    HA (headache)  History of vein stripping 11/04/2020   Right leg   Hyperlipidemia    Hypertension    Morbid obesity (HCC) 11/30/2020   OSA on CPAP 11/30/2020   Plantar fasciitis, bilateral 11/04/2020   With Bone spors   Retrognathia 11/30/2020   Shingles 2019   Thyroid disease    Torn ligament 11/04/2020   Right knee   UTI (urinary tract infection)     Past Surgical History:  Procedure Laterality Date   BILATERAL CARPAL TUNNEL RELEASE     BILIARY STENT PLACEMENT  07/23/2023   Procedure: BILIARY STENT PLACEMENT;  Surgeon: Lemar Lofty., MD;  Location: [facility] ENDOSCOPY;   Service: Gastroenterology;;   CHOLECYSTECTOMY N/A 07/18/2023   Procedure: LAPAROSCOPIC SUBTOTAL CHOLECYSTECTOMY WITH ICG DYE;  Surgeon: Moise Boring, MD;  Location: [facility] OR;  Service: General;  Laterality: N/A;   DILATION AND CURETTAGE OF UTERUS     ENDOSCOPIC RETROGRADE CHOLANGIOPANCREATOGRAPHY (ERCP) WITH PROPOFOL N/A 07/23/2023   Procedure: ENDOSCOPIC RETROGRADE CHOLANGIOPANCREATOGRAPHY (ERCP) WITH PROPOFOL;  Surgeon: Lemar Lofty., MD;  Location: [facility] ENDOSCOPY;  Service: Gastroenterology;  Laterality: N/A;   Hemorroids removed     KNEE ARTHROSCOPY Right    PANCREATIC STENT PLACEMENT  07/23/2023   Procedure: PANCREATIC STENT PLACEMENT;  Surgeon: Meridee Score Netty Starring., MD;  Location: [facility] ENDOSCOPY;  Service: Gastroenterology;;   Plantar fasciatis      Repaired   REMOVAL OF STONES  07/23/2023   Procedure: REMOVAL OF SLUDGE;  Surgeon: Lemar Lofty., MD;  Location: [facility] ENDOSCOPY;  Service: Gastroenterology;;   right hand cyst removal     SPHINCTEROTOMY  07/23/2023   Procedure: SPHINCTEROTOMY;  Surgeon: Mansouraty, Netty Starring., MD;  Location: [facility] ENDOSCOPY;  Service: Gastroenterology;;   vein vascular     surgery    Family History  Problem Relation Age of Onset   Arthritis Mother    Diabetes Father    Heart disease Father    Arthritis Sister    Diabetes Sister    Hyperlipidemia Sister    Hyperlipidemia Brother    Heart disease Brother        triple heart bypass   Sleep apnea Brother     Social History   Tobacco Use   Smoking status: Former   Smokeless tobacco: Never  Substance Use Topics   Alcohol use: Yes    Comment: occ    Subjective:   Hospital follow up due to complications from gallbladder surgery; continuing to feel weak/ easily winded; scheduled to see her surgeon tomorrow; drain is still in place;  Concerned for persisting sensation of shortness of breath;   Objective:  Vitals:   08/08/23 1036  BP: 96/62  Pulse: 71  SpO2: 97%  Weight: 241 lb  12.8 oz (109.7 kg)  Height: 5\' 7"  (1.702 m)    General: Well developed, well nourished, in no acute distress  Skin : Warm and dry.  Head: Normocephalic and atraumatic  Lungs: Respirations unlabored; clear to auscultation bilaterally without wheeze, rales, rhonchi  CVS exam: normal rate and regular rhythm.  Neurologic: Alert and oriented; speech intact; face symmetrical; moves all extremities well; CNII-XII intact without focal deficit   Assessment:  1. Shortness of breath   2. Abdominal pain, unspecified abdominal location     Plan:  Update labs today- am concerned for possible PE based on patient's worsening shortness of breath; she will also hold Losartan for now due to low blood pressure readings;  Follow up to be determined;   Time spent 30 minutes  No follow-ups on file.  Orders Placed This Encounter  Procedures   DG Chest 2 View    Standing Status:   Future    Number of Occurrences:   1    Expiration Date:   08/07/2024    Reason for Exam (SYMPTOM  OR DIAGNOSIS REQUIRED):   shortness of breath    Preferred imaging location?:   [facility]   DG Abd 2 Views    Standing Status:   Future    Number of Occurrences:   1    Expiration Date:   08/07/2024    Reason for Exam (SYMPTOM  OR DIAGNOSIS REQUIRED):   abdominal pain    Preferred imaging location?:   [facility]   CBC with Differential/Platelet   Comp Met (CMET)   D-Dimer, Quantitative    Requested Prescriptions    No prescriptions requested or ordered in this encounter

## 2023-08-08 NOTE — Patient Instructions (Signed)
Hold the Losartan for the next 24 hours- see what your blood pressure is tomorrow at the surgeon's office;

## 2023-08-09 ENCOUNTER — Other Ambulatory Visit: Payer: Self-pay | Admitting: Family

## 2023-08-13 ENCOUNTER — Encounter: Payer: Self-pay | Admitting: Family

## 2023-08-14 ENCOUNTER — Encounter (HOSPITAL_BASED_OUTPATIENT_CLINIC_OR_DEPARTMENT_OTHER): Payer: Self-pay | Admitting: General Surgery

## 2023-08-14 ENCOUNTER — Other Ambulatory Visit: Payer: Self-pay | Admitting: General Surgery

## 2023-08-14 DIAGNOSIS — Z9049 Acquired absence of other specified parts of digestive tract: Secondary | ICD-10-CM

## 2023-08-15 ENCOUNTER — Ambulatory Visit
Admission: RE | Admit: 2023-08-15 | Discharge: 2023-08-15 | Disposition: A | Discharge status: Home / Self Care | Payer: Medicare Other | Source: Ambulatory Visit | Attending: General Surgery | Admitting: General Surgery

## 2023-08-15 DIAGNOSIS — E079 Disorder of thyroid, unspecified: Secondary | ICD-10-CM | POA: Diagnosis not present

## 2023-08-15 DIAGNOSIS — K219 Gastro-esophageal reflux disease without esophagitis: Secondary | ICD-10-CM | POA: Diagnosis not present

## 2023-08-15 DIAGNOSIS — E785 Hyperlipidemia, unspecified: Secondary | ICD-10-CM | POA: Diagnosis not present

## 2023-08-15 DIAGNOSIS — Z9049 Acquired absence of other specified parts of digestive tract: Secondary | ICD-10-CM | POA: Diagnosis not present

## 2023-08-15 DIAGNOSIS — I10 Essential (primary) hypertension: Secondary | ICD-10-CM | POA: Diagnosis not present

## 2023-08-15 MED ORDER — IOPAMIDOL (ISOVUE-370) INJECTION 76%
80.0000 mL | Freq: Once | INTRAVENOUS | Status: AC | PRN
Start: 2023-08-15 — End: 2023-08-15
  Administered 2023-08-15: 80 mL via INTRAVENOUS

## 2023-09-18 ENCOUNTER — Other Ambulatory Visit: Payer: Self-pay | Admitting: Family

## 2023-09-19 DIAGNOSIS — R109 Unspecified abdominal pain: Secondary | ICD-10-CM | POA: Diagnosis not present

## 2023-09-19 DIAGNOSIS — Z9049 Acquired absence of other specified parts of digestive tract: Secondary | ICD-10-CM | POA: Diagnosis not present

## 2023-09-27 ENCOUNTER — Encounter: Payer: Self-pay | Admitting: Family

## 2023-09-27 ENCOUNTER — Ambulatory Visit: Admitting: Family

## 2023-09-27 VITALS — BP 118/72 | HR 59 | Ht 67.0 in | Wt 242.0 lb

## 2023-09-27 DIAGNOSIS — I10 Essential (primary) hypertension: Secondary | ICD-10-CM | POA: Diagnosis not present

## 2023-09-27 DIAGNOSIS — E119 Type 2 diabetes mellitus without complications: Secondary | ICD-10-CM | POA: Diagnosis not present

## 2023-09-27 DIAGNOSIS — Z7984 Long term (current) use of oral hypoglycemic drugs: Secondary | ICD-10-CM | POA: Diagnosis not present

## 2023-09-27 LAB — COMPREHENSIVE METABOLIC PANEL
ALT: 17 U/L (ref 0–35)
AST: 19 U/L (ref 0–37)
Albumin: 4.6 g/dL (ref 3.5–5.2)
Alkaline Phosphatase: 78 U/L (ref 39–117)
BUN: 19 mg/dL (ref 6–23)
CO2: 30 meq/L (ref 19–32)
Calcium: 10.2 mg/dL (ref 8.4–10.5)
Chloride: 102 meq/L (ref 96–112)
Creatinine, Ser: 1.09 mg/dL (ref 0.40–1.20)
GFR: 50.71 mL/min — ABNORMAL LOW (ref >60.00)
Glucose, Bld: 149 mg/dL — ABNORMAL HIGH (ref 70–99)
Potassium: 4.9 meq/L (ref 3.5–5.1)
Sodium: 142 meq/L (ref 135–145)
Total Bilirubin: 0.5 mg/dL (ref 0.2–1.2)
Total Protein: 7.4 g/dL (ref 6.0–8.3)

## 2023-09-27 LAB — HEMOGLOBIN A1C: Hgb A1c MFr Bld: 6.3 % (ref 4.6–6.5)

## 2023-09-27 NOTE — Progress Notes (Signed)
 Tricia Dodson is a 73 y.o. female with the following history as recorded in EpicCare:  Patient Active Problem List   Diagnosis Date Noted   Gastritis and gastroduodenitis 07/23/2023   Bile leak, postoperative 07/19/2023   Acute gangrenous cholecystitis 07/17/2023   Cholecystitis, acute 07/17/2023   AKI (acute kidney injury) (HCC) 07/17/2023   Primary osteoarthritis of both knees 04/18/2021   Dizziness 12/02/2020   Nonspecific abnormal electrocardiogram (ECG) (EKG) 12/02/2020   UTI (urinary tract infection)    Thyroid disease    Hypertension    Hyperlipidemia    HA (headache)    GERD (gastroesophageal reflux disease)    Colon polyps    Chicken pox    Arthritis    Allergy    Morbid obesity (HCC) 11/30/2020   Retrognathia 11/30/2020   CPAP (continuous positive airway pressure) dependence 11/30/2020   OSA on CPAP 11/30/2020   CPAP use counseling 11/04/2020   Carpal tunnel syndrome, bilateral 11/04/2020   Plantar fasciitis, bilateral 11/04/2020   Torn ligament 11/04/2020   History of vein stripping 11/04/2020   Shingles 2019    Current Outpatient Medications  Medication Sig Dispense Refill   amitriptyline (ELAVIL) 10 MG tablet Take 1 tablet (10 mg total) by mouth at bedtime. 90 tablet 3   atorvastatin (LIPITOR) 10 MG tablet TAKE 1 TABLET(10 MG) BY MOUTH DAILY 90 tablet 3   DULoxetine (CYMBALTA) 20 MG capsule Take 1 capsule (20 mg total) by mouth daily. 90 capsule 0   empagliflozin (JARDIANCE) 10 MG TABS tablet Take 1 tablet (10 mg total) by mouth daily. 90 tablet 1   fenofibrate micronized (LOFIBRA) 134 MG capsule TAKE 1 CAPSULE BY MOUTH DAILY BEFORE BREAKFAST 90 capsule 3   gabapentin (NEURONTIN) 300 MG capsule Take 1 capsule (300 mg total) by mouth 2 (two) times daily. 180 capsule 1   ipratropium (ATROVENT) 0.03 % nasal spray Place 2 sprays into both nostrils every 12 (twelve) hours. (Patient taking differently: Place 2 sprays into both nostrils as needed for rhinitis.) 30  mL 0   levocetirizine (XYZAL) 5 MG tablet TAKE 1 TABLET(5 MG) BY MOUTH DAILY 90 tablet 3   levothyroxine (SYNTHROID) 25 MCG tablet TAKE 1 TABLET(25 MCG) BY MOUTH DAILY 90 tablet 3   losartan (COZAAR) 50 MG tablet TAKE 1 TABLET(50 MG) BY MOUTH DAILY 90 tablet 1   omeprazole (PRILOSEC) 40 MG capsule TAKE 1 CAPSULE(40 MG) BY MOUTH TWICE DAILY 180 capsule 3   cyclobenzaprine (FLEXERIL) 10 MG tablet Take 0.5-1 tablets (5-10 mg total) by mouth 3 (three) times daily as needed. (Patient not taking: Reported on 08/08/2023) 30 tablet 0   No current facility-administered medications for this visit.    Allergies: Patient has no known allergies.  Past Medical History:  Diagnosis Date   Allergy    Arthritis    Carpal tunnel syndrome, bilateral 11/04/2020   Both wrists.    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   Shingles 2019   Thyroid disease    Torn  ligament 11/04/2020   Right knee   UTI (urinary tract infection)     Past Surgical History:  Procedure Laterality Date   BILATERAL CARPAL TUNNEL RELEASE     BILIARY STENT PLACEMENT  07/23/2023   Procedure: BILIARY STENT PLACEMENT;  Surgeon: Lemar Lofty., MD;  Location: [facility] ENDOSCOPY;  Service: Gastroenterology;;   CHOLECYSTECTOMY N/A 07/18/2023   Procedure: LAPAROSCOPIC SUBTOTAL CHOLECYSTECTOMY WITH ICG DYE;  Surgeon: Moise Boring, MD;  Location: [facility] OR;  Service: General;  Laterality: N/A;   DILATION AND CURETTAGE OF UTERUS     ENDOSCOPIC RETROGRADE CHOLANGIOPANCREATOGRAPHY (ERCP) WITH PROPOFOL  N/A 07/23/2023   Procedure: ENDOSCOPIC RETROGRADE CHOLANGIOPANCREATOGRAPHY (ERCP) WITH PROPOFOL;  Surgeon: Lemar Lofty., MD;  Location[facility] ENDOSCOPY;  Service: Gastroenterology;  Laterality: N/A;   Hemorroids removed     KNEE ARTHROSCOPY Right    PANCREATIC STENT PLACEMENT  07/23/2023   Procedure: PANCREATIC STENT PLACEMENT;  Surgeon: Meridee Score Netty Starring., MD;  Location: [facility] ENDOSCOPY;  Service: Gastroenterology;;   Plantar fasciatis      Repaired   REMOVAL OF STONES  07/23/2023   Procedure: REMOVAL OF SLUDGE;  Surgeon: Lemar Lofty., MD;  Location[facility] ENDOSCOPY;  Service: Gastroenterology;;   right hand cyst removal     SPHINCTEROTOMY  07/23/2023   Procedure: SPHINCTEROTOMY;  Surgeon: Mansouraty, Netty Starring., MD;  Location: [facility] ENDOSCOPY;  Service: Gastroenterology;;   vein vascular     surgery    Family History  Problem Relation Age of Onset   Arthritis Mother    Diabetes Father    Heart disease Father    Arthritis Sister    Diabetes Sister    Hyperlipidemia Sister    Hyperlipidemia Brother    Heart disease Brother        triple heart bypass   Sleep apnea Brother     Social History   Tobacco Use   Smoking status: Former   Smokeless tobacco: Never  Substance Use Topics   Alcohol use: Yes    Comment: occ    Subjective:   Follow up on HTN and Type 2 Diabetes; would like to get labs updated today; Denies any chest pain, shortness of breath, blurred vision or headache. Did re-start her Losartan approximately 2 weeks after abdominal surgery;     Objective:  Vitals:   09/27/23 0807  BP: 118/72  Pulse: (!) 59  SpO2: 100%  Weight: 242 lb (109.8 kg)  Height: 5\' 7"  (1.702 m)    General: Well developed, well nourished, in no acute distress  Skin : Warm and dry.  Head: Normocephalic and atraumatic  Lungs: Respirations unlabored; clear to auscultation bilaterally without wheeze, rales, rhonchi  CVS exam: normal rate and regular rhythm.  Neurologic:  Alert and oriented; speech intact; face symmetrical; moves all extremities well; CNII-XII intact without focal deficit   Assessment:  1. Type 2 diabetes mellitus without complication, without long-term current use of insulin (HCC)   2. Diabetes mellitus treated with oral medication (HCC)   3. Primary hypertension     Plan:  Update CMP, Hgba1c today; continue same medication; follow up in 4-6 months;  3.   Stable; continue same medication;   No follow-ups on file.  Orders Placed This Encounter  Procedures   Comp Met (CMET)   Hemoglobin A1c    Requested Prescriptions    No prescriptions requested or ordered in this encounter

## 2023-09-30 ENCOUNTER — Encounter (HOSPITAL_COMMUNITY): Payer: Self-pay | Admitting: Gastroenterology

## 2023-09-30 NOTE — Telephone Encounter (Signed)
 Procedure:endo Procedure date: 10/07/23 Procedure location: [facility] Arrival Time: 745am Spoke with the patient Y/N: y Any prep concerns? n  Has the patient obtained the prep from the pharmacy ? y Do you have a care partner and transportation: y Any additional concerns? n

## 2023-09-30 NOTE — Progress Notes (Signed)
 Attempted to obtain medical history for pre op call via telephone, unable to reach at this time. HIPAA compliant voicemail message left requesting return call to pre surgical testing department.

## 2023-10-10 ENCOUNTER — Ambulatory Visit (HOSPITAL_COMMUNITY)

## 2023-10-10 ENCOUNTER — Ambulatory Visit (HOSPITAL_COMMUNITY): Admitting: Anesthesiology

## 2023-10-10 ENCOUNTER — Encounter (HOSPITAL_COMMUNITY): Payer: Self-pay | Admitting: Gastroenterology

## 2023-10-10 ENCOUNTER — Other Ambulatory Visit: Payer: Self-pay

## 2023-10-10 ENCOUNTER — Ambulatory Visit (HOSPITAL_COMMUNITY)
Admission: RE | Admit: 2023-10-10 | Discharge: 2023-10-10 | Disposition: A | Discharge status: Home / Self Care | Payer: Medicare Other | Attending: Gastroenterology | Admitting: Gastroenterology

## 2023-10-10 ENCOUNTER — Ambulatory Visit (HOSPITAL_BASED_OUTPATIENT_CLINIC_OR_DEPARTMENT_OTHER): Admitting: Anesthesiology

## 2023-10-10 ENCOUNTER — Encounter (HOSPITAL_COMMUNITY)
Admission: RE | Disposition: A | Discharge status: Home / Self Care | Payer: Self-pay | Source: Home / Self Care | Attending: Gastroenterology

## 2023-10-10 DIAGNOSIS — K449 Diaphragmatic hernia without obstruction or gangrene: Secondary | ICD-10-CM | POA: Diagnosis not present

## 2023-10-10 DIAGNOSIS — K219 Gastro-esophageal reflux disease without esophagitis: Secondary | ICD-10-CM | POA: Insufficient documentation

## 2023-10-10 DIAGNOSIS — K314 Gastric diverticulum: Secondary | ICD-10-CM | POA: Insufficient documentation

## 2023-10-10 DIAGNOSIS — G8929 Other chronic pain: Secondary | ICD-10-CM

## 2023-10-10 DIAGNOSIS — K838 Other specified diseases of biliary tract: Secondary | ICD-10-CM | POA: Diagnosis not present

## 2023-10-10 DIAGNOSIS — T85590A Other mechanical complication of bile duct prosthesis, initial encounter: Secondary | ICD-10-CM

## 2023-10-10 DIAGNOSIS — Z8249 Family history of ischemic heart disease and other diseases of the circulatory system: Secondary | ICD-10-CM | POA: Diagnosis not present

## 2023-10-10 DIAGNOSIS — K8010 Calculus of gallbladder with chronic cholecystitis without obstruction: Secondary | ICD-10-CM | POA: Diagnosis not present

## 2023-10-10 DIAGNOSIS — Z9889 Other specified postprocedural states: Secondary | ICD-10-CM

## 2023-10-10 DIAGNOSIS — K2289 Other specified disease of esophagus: Secondary | ICD-10-CM

## 2023-10-10 DIAGNOSIS — K3189 Other diseases of stomach and duodenum: Secondary | ICD-10-CM

## 2023-10-10 DIAGNOSIS — K8020 Calculus of gallbladder without cholecystitis without obstruction: Secondary | ICD-10-CM | POA: Diagnosis not present

## 2023-10-10 DIAGNOSIS — M1990 Unspecified osteoarthritis, unspecified site: Secondary | ICD-10-CM | POA: Insufficient documentation

## 2023-10-10 DIAGNOSIS — T85528A Displacement of other gastrointestinal prosthetic devices, implants and grafts, initial encounter: Secondary | ICD-10-CM

## 2023-10-10 DIAGNOSIS — R1013 Epigastric pain: Secondary | ICD-10-CM | POA: Diagnosis present

## 2023-10-10 DIAGNOSIS — K319 Disease of stomach and duodenum, unspecified: Secondary | ICD-10-CM | POA: Diagnosis not present

## 2023-10-10 DIAGNOSIS — K317 Polyp of stomach and duodenum: Secondary | ICD-10-CM

## 2023-10-10 DIAGNOSIS — K5710 Diverticulosis of small intestine without perforation or abscess without bleeding: Secondary | ICD-10-CM | POA: Diagnosis not present

## 2023-10-10 DIAGNOSIS — E66813 Obesity, class 3: Secondary | ICD-10-CM | POA: Diagnosis not present

## 2023-10-10 DIAGNOSIS — Z4682 Encounter for fitting and adjustment of non-vascular catheter: Secondary | ICD-10-CM | POA: Diagnosis not present

## 2023-10-10 DIAGNOSIS — E119 Type 2 diabetes mellitus without complications: Secondary | ICD-10-CM | POA: Diagnosis not present

## 2023-10-10 DIAGNOSIS — I10 Essential (primary) hypertension: Secondary | ICD-10-CM | POA: Insufficient documentation

## 2023-10-10 DIAGNOSIS — Z833 Family history of diabetes mellitus: Secondary | ICD-10-CM | POA: Diagnosis not present

## 2023-10-10 DIAGNOSIS — Z9049 Acquired absence of other specified parts of digestive tract: Secondary | ICD-10-CM | POA: Diagnosis not present

## 2023-10-10 DIAGNOSIS — K839 Disease of biliary tract, unspecified: Secondary | ICD-10-CM

## 2023-10-10 DIAGNOSIS — Z6837 Body mass index (BMI) 37.0-37.9, adult: Secondary | ICD-10-CM | POA: Insufficient documentation

## 2023-10-10 HISTORY — PX: STONE EXTRACTION WITH BASKET: SHX5318

## 2023-10-10 HISTORY — PX: STENT REMOVAL: SHX6421

## 2023-10-10 HISTORY — PX: ENDOSCOPIC RETROGRADE CHOLANGIOPANCREATOGRAPHY (ERCP) WITH PROPOFOL: SHX5810

## 2023-10-10 HISTORY — PX: BIOPSY OF SKIN SUBCUTANEOUS TISSUE AND/OR MUCOUS MEMBRANE: SHX6741

## 2023-10-10 LAB — GLUCOSE, CAPILLARY: Glucose-Capillary: 145 mg/dL — ABNORMAL HIGH (ref 70–99)

## 2023-10-10 SURGERY — ENDOSCOPIC RETROGRADE CHOLANGIOPANCREATOGRAPHY (ERCP) WITH PROPOFOL
Anesthesia: General

## 2023-10-10 MED ORDER — LIDOCAINE 2% (20 MG/ML) 5 ML SYRINGE
INTRAMUSCULAR | Status: DC | PRN
  Administered 2023-10-10: 60 mg via INTRAVENOUS

## 2023-10-10 MED ORDER — LACTATED RINGERS IV SOLN
INTRAVENOUS | Status: AC | PRN
Start: 2023-10-10 — End: 2023-10-10
  Administered 2023-10-10: 1000 mL via INTRAVENOUS

## 2023-10-10 MED ORDER — SODIUM CHLORIDE 0.9 % IV SOLN: INTRAVENOUS | Status: DC

## 2023-10-10 MED ORDER — ROCURONIUM BROMIDE 10 MG/ML (PF) SYRINGE
PREFILLED_SYRINGE | INTRAVENOUS | Status: DC | PRN
Start: 2023-10-10 — End: 2023-10-10
  Administered 2023-10-10: 50 mg via INTRAVENOUS

## 2023-10-10 MED ORDER — SODIUM CHLORIDE 0.9 % IV SOLN
INTRAVENOUS | Status: DC | PRN
  Administered 2023-10-10: 25 mL

## 2023-10-10 MED ORDER — FENTANYL CITRATE (PF) 100 MCG/2ML IJ SOLN
INTRAMUSCULAR | Status: AC
  Filled 2023-10-10: qty 2

## 2023-10-10 MED ORDER — DICLOFENAC SUPPOSITORY 100 MG
RECTAL | Status: DC | PRN
  Administered 2023-10-10: 100 mg via RECTAL

## 2023-10-10 MED ORDER — OXYCODONE HCL 5 MG PO TABS: 5.0000 mg | ORAL_TABLET | Freq: Once | ORAL | Status: DC | PRN

## 2023-10-10 MED ORDER — ONDANSETRON HCL 4 MG/2ML IJ SOLN
INTRAMUSCULAR | Status: DC | PRN
  Administered 2023-10-10: 4 mg via INTRAVENOUS

## 2023-10-10 MED ORDER — DICLOFENAC SUPPOSITORY 100 MG
RECTAL | Status: AC
Start: 2023-10-10 — End: ?
  Filled 2023-10-10: qty 1

## 2023-10-10 MED ORDER — CIPROFLOXACIN IN D5W 400 MG/200ML IV SOLN
INTRAVENOUS | Status: AC
  Filled 2023-10-10: qty 200

## 2023-10-10 MED ORDER — PROPOFOL 10 MG/ML IV BOLUS
INTRAVENOUS | Status: AC
  Filled 2023-10-10: qty 20

## 2023-10-10 MED ORDER — FENTANYL CITRATE (PF) 250 MCG/5ML IJ SOLN
INTRAMUSCULAR | Status: DC | PRN
Start: 2023-10-10 — End: 2023-10-10
  Administered 2023-10-10: 100 ug via INTRAVENOUS

## 2023-10-10 MED ORDER — ONDANSETRON HCL 4 MG/2ML IJ SOLN: 4.0000 mg | Freq: Four times a day (QID) | INTRAMUSCULAR | Status: DC | PRN

## 2023-10-10 MED ORDER — SUGAMMADEX SODIUM 200 MG/2ML IV SOLN
INTRAVENOUS | Status: DC | PRN
  Administered 2023-10-10: 400 mg via INTRAVENOUS

## 2023-10-10 MED ORDER — CIPROFLOXACIN HCL 500 MG PO TABS: 500.0000 mg | ORAL_TABLET | Freq: Two times a day (BID) | ORAL | 0 refills | Status: AC

## 2023-10-10 MED ORDER — OXYCODONE HCL 5 MG/5ML PO SOLN: 5.0000 mg | Freq: Once | ORAL | Status: DC | PRN

## 2023-10-10 MED ORDER — GLUCAGON HCL RDNA (DIAGNOSTIC) 1 MG IJ SOLR
INTRAMUSCULAR | Status: AC
Start: 2023-10-10 — End: ?
  Filled 2023-10-10: qty 1

## 2023-10-10 MED ORDER — PROPOFOL 10 MG/ML IV BOLUS
INTRAVENOUS | Status: DC | PRN
  Administered 2023-10-10: 130 mg via INTRAVENOUS

## 2023-10-10 MED ORDER — DEXAMETHASONE SODIUM PHOSPHATE 10 MG/ML IJ SOLN
INTRAMUSCULAR | Status: DC | PRN
  Administered 2023-10-10: 5 mg via INTRAVENOUS

## 2023-10-10 MED ORDER — FENTANYL CITRATE (PF) 100 MCG/2ML IJ SOLN: 25.0000 ug | INTRAMUSCULAR | Status: DC | PRN

## 2023-10-10 MED ORDER — CIPROFLOXACIN IN D5W 400 MG/200ML IV SOLN
400.0000 mg | Freq: Once | INTRAVENOUS | Status: AC
  Administered 2023-10-10: 400 mg via INTRAVENOUS

## 2023-10-10 NOTE — Anesthesia Preprocedure Evaluation (Signed)
 Anesthesia Evaluation  Patient identified by MRN, date of birth, ID band Patient awake    Reviewed: Allergy & Precautions, H&P , NPO status , Patient's Chart, lab work & pertinent test results  Airway Mallampati: III  TM Distance: >3 FB Neck ROM: Full    Dental no notable dental hx. (+) Partial Lower, Partial Upper, Dental Advisory Given   Pulmonary sleep apnea , former smoker   Pulmonary exam normal breath sounds clear to auscultation       Cardiovascular hypertension, Pt. on medications  Rhythm:Regular Rate:Normal     Neuro/Psych  Headaches  negative psych ROS   GI/Hepatic Neg liver ROS,GERD  Medicated,,  Endo/Other  diabetes, Type 2, Oral Hypoglycemic Agents  Class 3 obesity  Renal/GU negative Renal ROS  negative genitourinary   Musculoskeletal  (+) Arthritis , Osteoarthritis,    Abdominal   Peds  Hematology negative hematology ROS (+)   Anesthesia Other Findings   Reproductive/Obstetrics negative OB ROS                              Anesthesia Physical Anesthesia Plan  ASA: 3  Anesthesia Plan: General   Post-op Pain Management:    Induction: Intravenous  PONV Risk Score and Plan: 4 or greater and Ondansetron, Dexamethasone and Treatment may vary due to age or medical condition  Airway Management Planned: Oral ETT  Additional Equipment:   Intra-op Plan:   Post-operative Plan: Extubation in OR  Informed Consent: I have reviewed the patients History and Physical, chart, labs and discussed the procedure including the risks, benefits and alternatives for the proposed anesthesia with the patient or authorized representative who has indicated his/her understanding and acceptance.     Dental advisory given  Plan Discussed with: CRNA and Anesthesiologist  Anesthesia Plan Comments:         Anesthesia Quick Evaluation

## 2023-10-10 NOTE — Anesthesia Procedure Notes (Signed)
 Procedure Name: Intubation Date/Time: 10/10/2023 8:04 AM  Performed by: Elyn Peers, CRNAPre-anesthesia Checklist: Patient identified, Emergency Drugs available, Suction available, Patient being monitored and Timeout performed Patient Re-evaluated:Patient Re-evaluated prior to induction Oxygen Delivery Method: Circle system utilized Preoxygenation: Pre-oxygenation with 100% oxygen Induction Type: IV induction Ventilation: Mask ventilation without difficulty Laryngoscope Size: Miller and 3 Grade View: Grade I Tube type: Oral Tube size: 7.5 mm Number of attempts: 1 Airway Equipment and Method: Stylet Placement Confirmation: ETT inserted through vocal cords under direct vision, positive ETCO2 and breath sounds checked- equal and bilateral Secured at: 23 cm Tube secured with: Tape Dental Injury: Teeth and Oropharynx as per pre-operative assessment

## 2023-10-10 NOTE — Discharge Instructions (Signed)

## 2023-10-10 NOTE — Anesthesia Postprocedure Evaluation (Signed)
 Anesthesia Post Note  Patient: Tricia Dodson  Procedure(s) Performed: ENDOSCOPIC RETROGRADE CHOLANGIOPANCREATOGRAPHY (ERCP) WITH PROPOFOL BIOPSY, SKIN, SUBCUTANEOUS TISSUE, OR MUCOUS MEMBRANE STENT REMOVAL ERCP, WITH LITHROTRIPSY OR REMOVAL OF COMMON BILE DUCT CALCULUS     Patient location during evaluation: PACU Anesthesia Type: General Level of consciousness: awake and alert Pain management: pain level controlled Vital Signs Assessment: post-procedure vital signs reviewed and stable Respiratory status: spontaneous breathing, nonlabored ventilation, respiratory function stable and patient connected to nasal cannula oxygen Cardiovascular status: blood pressure returned to baseline and stable Postop Assessment: no apparent nausea or vomiting Anesthetic complications: no   No notable events documented.  Last Vitals:  Vitals:   10/10/23 0950 10/10/23 1000  BP: 139/71 (!) 141/93  Pulse: 60 (!) 58  Resp: 12 14  Temp:    SpO2: 99% 99%    Last Pain:  Vitals:   10/10/23 1000  TempSrc:   PainSc: 0-No pain                 Jarmel Linhardt S

## 2023-10-10 NOTE — Op Note (Addendum)
 [facility] Patient Name: Tricia Dodson Procedure Date: 10/10/2023 MRN: [id number] Attending MD: Corliss Parish , MD, [id number] Date of Birth: 12-26-1950 CSN: [id number] Age: 73 Admit Type: Outpatient Procedure:                ERCP Indications:              Bile leak, Biliary stent removal, Epigastric                            abdominal pain, Abdominal pain in the right upper                            quadrant Providers:                Corliss Parish, MD, Doristine Mango, RN, Alan Ripper, Technician Referring MD:             Corliss Parish, MD Medicines:                General Anesthesia, Cipro 400 mg IV, Diclofenac 100                            mg rectal Complications:            No immediate complications. Estimated Blood Loss:     Estimated blood loss was minimal. Procedure:                Pre-Anesthesia Assessment:                           - Prior to the procedure, a History and Physical                            was performed, and patient medications and                            allergies were reviewed. The patient's tolerance of                            previous anesthesia was also reviewed. The risks                            and benefits of the procedure and the sedation                            options and risks were discussed with the patient.                            All questions were answered, and informed consent                            was obtained. Prior Anticoagulants: The patient has                            taken  no anticoagulant or antiplatelet agents. ASA                            Grade Assessment: III - A patient with severe                            systemic disease. After reviewing the risks and                            benefits, the patient was deemed in satisfactory                            condition to undergo the procedure.                           After obtaining informed  consent, the scope was                            passed under direct vision. Throughout the                            procedure, the patient's blood pressure, pulse, and                            oxygen saturations were monitored continuously. The                            [employer] D single use                            duodenoscope was introduced through the mouth, and                            used to inject contrast into and used to inject                            contrast into the bile duct. The [id number]-H190 ([id number])                            Olympus endoscope was introduced through the mouth,                            and used to inject contrast into. The ERCP was                            accomplished without difficulty. The patient                            tolerated the procedure. Scope In: Scope Out: Findings:      A scout film of the abdomen was obtained. Surgical clips, consistent       with a previous cholecystectomy, were seen in the area of the right       upper quadrant of the abdomen.  A standard esophagogastroduodenoscopy scope was used for the examination       of the upper gastrointestinal tract. The scope was passed under direct       vision through the upper GI tract. No gross lesions were noted in the       entire esophagus. The Z-line was irregular and was found 38 cm from the       incisors. A 1 cm hiatal hernia was present. Multiple small sessile       polyps were found in the cardia and in the gastric fundus. Patchy mildly       erythematous mucosa without bleeding was found in the entire examined       stomach - biopsied for HP evaluation. No gross lesions were noted in the       duodenal bulb, in the first portion of the duodenum and in the second       portion of the duodenum - biopsied for enteropathy evaluation. The major       papilla was located entirely within a diverticulum and 2 previously       placed hemoclips were  still in place. A biliary sphincterotomy had been       performed. The sphincterotomy appeared open. One plastic biliary stent       originating in the biliary tree was emerging from the major papilla. The       stent was partially occluded. One stent was removed from the biliary       tree using a Raptor grasping device.      A 0.035 inch x 260 cm straight Hydra Jagwire was passed into the biliary       tree. The Hydratome sphincterotome was passed over the guidewire and the       bile duct was then deeply cannulated. Contrast was injected. I       personally interpreted the bile duct images. Ductal flow of contrast was       adequate. Image quality was adequate. Contrast extended to the entire       biliary tree. Opacification of the entire biliary tree was successful.       The main bile duct was mildly dilated. The largest diameter was 8 mm.       The gallbladder contained two stones. To discover objects, the biliary       tree was swept with a retrieval balloon. Sludge was swept from the duct.       An occlusion cholangiogram was performed that showed no further       significant biliary pathology and no evidence of a leak, but the stones       noted in the partial gallbladder still present were noted.      A pancreatogram was not performed.      The duodenoscope was withdrawn from the patient. Impression:               - No gross lesions in the entire esophagus. Z-line                            irregular, 38 cm from the incisors.                           - 1 cm hiatal hernia.                           -  Multiple gastric polyps (fundic gland).                           - Erythematous mucosa in the stomach. Biopsied for                            HP.                           - No gross lesions in the duodenal bulb, in the                            first portion of the duodenum and in the second                            portion of the duodenum. Biopsied for enteropathy                             rule out.                           - The major papilla was located entirely within a                            diverticulum. 2 hemoclips noted in region from                            prior ERCP.                           - Prior biliary sphincterotomy appeared open.                           - One partially occluded stent from the biliary                            tree was seen in the major papilla. This was                            removed.                           - The entire main bile duct was mildly dilated.                           - Cholelithiasis was found in gallbladder remnant.                           - The biliary tree was swept and sludge was found.                           - No evidence of persisting biliary leak. Moderate Sedation:      Not Applicable - Patient had care per Anesthesia. Recommendation:           - The patient will be observed  post-procedure,                            until all discharge criteria are met.                           - Discharge patient to home.                           - Patient has a contact number available for                            emergencies. The signs and symptoms of potential                            delayed complications were discussed with the                            patient. Return to normal activities tomorrow.                            Written discharge instructions were provided to the                            patient.                           - Low fat diet today.                           - Observe patient's clinical course.                           - Watch for pancreatitis, bleeding, perforation,                            and cholangitis.                           - Followup with Surgery to discuss if there is any                            further role for further attempt at completion                            cholecystectomy to decrease risks of complications                             in future.                           - Will consider updated CTAbdomen to further                            evaluate symptoms (will discuss with patient).                           -  The findings and recommendations were discussed                            with the patient.                           - The findings and recommendations were discussed                            with the designated responsible adult. Procedure Code(s):        --- Professional ---                           [phone], Endoscopic retrograde                            cholangiopancreatography (ERCP); with removal of                            foreign body(s) or stent(s) from biliary/pancreatic                            duct(s)                           43264, Endoscopic retrograde                            cholangiopancreatography (ERCP); with removal of                            calculi/debris from biliary/pancreatic duct(s)                           74328, 26, Endoscopic catheterization of the                            biliary ductal system, radiological supervision and                            interpretation Diagnosis Code(s):        --- Professional ---                           K22.89, Other specified disease of esophagus                           K44.9, Diaphragmatic hernia without obstruction or                            gangrene                           K31.89, Other diseases of stomach and duodenum                           K31.7, Polyp of stomach and duodenum  T85.590A, Other mechanical complication of bile                            duct prosthesis, initial encounter                           K83.8, Other specified diseases of biliary tract                           K80.20, Calculus of gallbladder without                            cholecystitis without obstruction                           Z46.59, Encounter for fitting and adjustment of                             other gastrointestinal appliance and device                           R10.13, Epigastric pain                           R10.11, Right upper quadrant pain CPT copyright 2022 American Medical Association. All rights reserved. The codes documented in this report are preliminary and upon coder review may  be revised to meet current compliance requirements. Corliss Parish, MD 10/10/2023 9:13:27 AM Number of Addenda: 0

## 2023-10-10 NOTE — Transfer of Care (Signed)
 Immediate Anesthesia Transfer of Care Note  Patient: Tricia Dodson  Procedure(s) Performed: ENDOSCOPIC RETROGRADE CHOLANGIOPANCREATOGRAPHY (ERCP) WITH PROPOFOL BIOPSY, SKIN, SUBCUTANEOUS TISSUE, OR MUCOUS MEMBRANE STENT REMOVAL  Patient Location: PACU and Endoscopy Unit  Anesthesia Type:General  Level of Consciousness: awake, alert , and patient cooperative  Airway & Oxygen Therapy: Patient Spontanous Breathing and Patient connected to face mask oxygen  Post-op Assessment: Report given to RN and Post -op Vital signs reviewed and stable  Post vital signs: Reviewed and stable  Last Vitals:  Vitals Value Taken Time  BP 156/102 10/10/23 0900  Temp    Pulse 61 10/10/23 0901  Resp 18 10/10/23 0901  SpO2 100 % 10/10/23 0901  Vitals shown include unfiled device data.  Last Pain:  Vitals:   10/10/23 0707  TempSrc: Temporal  PainSc: 2          Complications: No notable events documented.

## 2023-10-10 NOTE — H&P (Signed)
 GASTROENTEROLOGY PROCEDURE H&P NOTE   Primary Care Physician: Olive Bass, FNP  HPI: Tricia Dodson is a 73 y.o. female who presents for ERCP for bile leak followup and stent pull/cleanout.  Past Medical History:  Diagnosis Date   Allergy    Arthritis    Carpal tunnel syndrome, bilateral 11/04/2020   Both wrists.    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   Shingles 2019   Thyroid disease    Torn ligament 11/04/2020   Right knee   UTI (urinary tract infection)    Past Surgical History:  Procedure Laterality Date   BILATERAL CARPAL TUNNEL RELEASE     BILIARY STENT PLACEMENT  07/23/2023   Procedure: BILIARY STENT PLACEMENT;  Surgeon: Lemar Lofty., MD;  Location: [facility] ENDOSCOPY;  Service: Gastroenterology;;   CHOLECYSTECTOMY N/A 07/18/2023   Procedure: LAPAROSCOPIC SUBTOTAL CHOLECYSTECTOMY WITH ICG DYE;  Surgeon: Moise Boring, MD;  Location: [facility] OR;  Service: General;  Laterality: N/A;   DILATION AND CURETTAGE OF UTERUS     ENDOSCOPIC RETROGRADE CHOLANGIOPANCREATOGRAPHY (ERCP) WITH PROPOFOL N/A 07/23/2023   Procedure: ENDOSCOPIC RETROGRADE CHOLANGIOPANCREATOGRAPHY (ERCP) WITH PROPOFOL;  Surgeon: Lemar Lofty., MD;  Location: [facility] ENDOSCOPY;  Service: Gastroenterology;  Laterality: N/A;   Hemorroids removed     KNEE ARTHROSCOPY Right    PANCREATIC STENT PLACEMENT  07/23/2023   Procedure: PANCREATIC STENT PLACEMENT;  Surgeon: Meridee Score Netty Starring., MD;  Location: [facility] ENDOSCOPY;  Service: Gastroenterology;;    Plantar fasciatis      Repaired   REMOVAL OF STONES  07/23/2023   Procedure: REMOVAL OF SLUDGE;  Surgeon: Meridee Score Netty Starring., MD;  Location: [facility] ENDOSCOPY;  Service: Gastroenterology;;   right hand cyst removal     SPHINCTEROTOMY  07/23/2023   Procedure: SPHINCTEROTOMY;  Surgeon: Lemar Lofty., MD;  Location: [facility] ENDOSCOPY;  Service: Gastroenterology;;   vein vascular     surgery   Current Facility-Administered Medications  Medication Dose Route Frequency Provider Last Rate Last Admin   0.9 %  sodium chloride infusion   Intravenous Continuous Mansouraty, Netty Starring., MD       fentaNYL (SUBLIMAZE) injection 25-50 mcg  25-50 mcg Intravenous Q5 min PRN Hodierne, Adam, MD       lactated ringers infusion    Continuous PRN Mansouraty, Netty Starring., MD 10 mL/hr at 10/10/23 0726 1,000 mL at 10/10/23 0726   ondansetron (ZOFRAN) injection 4 mg  4 mg Intravenous Q6H PRN Hodierne, Adam, MD       oxyCODONE (Oxy IR/ROXICODONE) immediate release tablet 5 mg  5 mg Oral Once PRN Achille Rich, MD       Or   oxyCODONE (ROXICODONE) 5 MG/5ML solution 5 mg  5 mg Oral Once PRN Achille Rich, MD        Current Facility-Administered Medications:    0.9 %  sodium chloride infusion, , Intravenous, Continuous, Mansouraty, Netty Starring., MD   fentaNYL (SUBLIMAZE) injection 25-50 mcg, 25-50 mcg, Intravenous, Q5 min PRN, Achille Rich, MD  lactated ringers infusion, , , Continuous PRN, Mansouraty, Netty Starring., MD, Last Rate: 10 mL/hr at 10/10/23 0726, 1,000 mL at 10/10/23 0726   ondansetron (ZOFRAN) injection 4 mg, 4 mg, Intravenous, Q6H PRN, Hodierne, Adam, MD   oxyCODONE (Oxy IR/ROXICODONE) immediate release tablet 5 mg, 5 mg, Oral, Once PRN **OR** oxyCODONE (ROXICODONE) 5 MG/5ML solution 5 mg, 5 mg, Oral, Once PRN, Hodierne, Adam, MD No Known Allergies Family History  Problem Relation Age of Onset   Arthritis Mother    Diabetes Father    Heart disease Father    Arthritis Sister    Diabetes Sister     Hyperlipidemia Sister    Hyperlipidemia Brother    Heart disease Brother        triple heart bypass   Sleep apnea Brother    Social History   Socioeconomic History   Marital status: Single    Spouse name: Not on file   Number of children: Not on file   Years of education: Not on file   Highest education level: Bachelor's degree (e.g., BA, AB, BS)  Occupational History   Not on file  Tobacco Use   Smoking status: Former   Smokeless tobacco: Never  Vaping Use   Vaping status: Never Used  Substance and Sexual Activity   Alcohol use: Yes    Comment: occ   Drug use: Not on file   Sexual activity: Not Currently  Other Topics Concern   Not on file  Social History Narrative   Not on file   Social Drivers of Health   Financial Resource Strain: Patient Declined (07/10/2023)   Overall Financial Resource Strain (CARDIA)    Difficulty of Paying Living Expenses: Patient declined  Food Insecurity: Unknown (07/17/2023)   Hunger Vital Sign    Worried About Running Out of Food in the Last Year: Never true    Ran Out of Food in the Last Year: Patient declined  Transportation Needs: No Transportation Needs (07/17/2023)   PRAPARE - Administrator, Civil Service (Medical): No    Lack of Transportation (Non-Medical): No  Physical Activity: Insufficiently Active (07/10/2023)   Exercise Vital Sign    Days of Exercise per Week: 1 day    Minutes of Exercise per Session: 30 min  Stress: No Stress Concern Present (07/10/2023)   [employer] of Occupational Health - Occupational Stress Questionnaire    Feeling of Stress : Only a little  Social Connections: Socially Isolated (07/17/2023)   Social Connection and Isolation Panel [NHANES]    Frequency of Communication with Friends and Family: Never    Frequency of Social Gatherings with Friends and Family: Never    Attends Religious Services: Never    Database administrator or Organizations: No    Attends Banker Meetings:  Never    Marital Status: Never married  Intimate Partner Violence: Not At Risk (07/17/2023)   Humiliation, Afraid, Rape, and Kick questionnaire    Fear of Current or Ex-Partner: No    Emotionally Abused: No    Physically Abused: No    Sexually Abused: No    Physical Exam: Today's Vitals   10/10/23 0707  BP: 138/67  Pulse: 66  Resp: 16  Temp: 97.7 F (36.5 C)  TempSrc: Temporal  SpO2: 97%  Weight: 108.9 kg  Height: 5\' 7"  (1.702 m)  PainSc: 2    Body mass index is 37.59 kg/m. GEN: NAD EYE: Sclerae anicteric ENT: MMM CV: Non-tachycardic GI: Soft, NT/ND NEURO:  Alert & Oriented x 3  Lab Results: No results for input(s): "WBC", "HGB", "HCT", "PLT" in the last 72 hours. BMET No results for input(s): "NA", "K", "CL", "CO2", "GLUCOSE", "BUN", "CREATININE", "CALCIUM" in the last 72 hours. LFT No results for input(s): "PROT", "ALBUMIN", "AST", "ALT", "ALKPHOS", "BILITOT", "BILIDIR", "IBILI" in the last 72 hours. PT/INR No results for input(s): "LABPROT", "INR" in the last 72 hours.   Impression / Plan: This is a 73 y.o.female who presents for ERCP for bile leak followup and stent pull/cleanout.  The risks of an ERCP were discussed at length, including but not limited to the risk of perforation, bleeding, abdominal pain, post-ERCP pancreatitis (while usually mild can be severe and even life threatening).   The risks and benefits of endoscopic evaluation/treatment were discussed with the patient and/or family; these include but are not limited to the risk of perforation, infection, bleeding, missed lesions, lack of diagnosis, severe illness requiring hospitalization, as well as anesthesia and sedation related illnesses.  The patient's history has been reviewed, patient examined, no change in status, and deemed stable for procedure.  The patient and/or family is agreeable to proceed.    Corliss Parish, MD [facility] Office # [phone]

## 2023-10-11 ENCOUNTER — Encounter (HOSPITAL_COMMUNITY): Payer: Self-pay | Admitting: Gastroenterology

## 2023-10-11 DIAGNOSIS — K838 Other specified diseases of biliary tract: Secondary | ICD-10-CM

## 2023-10-11 LAB — SURGICAL PATHOLOGY

## 2023-10-13 ENCOUNTER — Encounter: Payer: Self-pay | Admitting: Gastroenterology

## 2023-10-14 ENCOUNTER — Telehealth: Payer: Self-pay

## 2023-10-14 DIAGNOSIS — K839 Disease of biliary tract, unspecified: Secondary | ICD-10-CM

## 2023-10-14 DIAGNOSIS — K838 Other specified diseases of biliary tract: Secondary | ICD-10-CM

## 2023-10-14 DIAGNOSIS — K810 Acute cholecystitis: Secondary | ICD-10-CM

## 2023-10-14 NOTE — Telephone Encounter (Signed)
-----   Message from [facility] sent at 10/13/2023  5:34 AM EDT ----- Regarding: Follow-up Tricia Dodson, Please reach out to the patient. Let her know that surgery and I have discussed her case after her recent ERCP, and we would like to further evaluate her symptoms with an updated CT abdomen/pelvis with IV and oral contrast. Surgery is going to be working on contacting her to schedule follow-up with them.  But the CT scan will be helpful as well. Thanks. GM

## 2023-10-14 NOTE — Telephone Encounter (Signed)
 Left detailed message for patient regarding CT scan. Patient will be contacted by [facility] Radiology scheduling to set up CT scan.

## 2023-10-18 ENCOUNTER — Ambulatory Visit (HOSPITAL_COMMUNITY)
Admission: RE | Admit: 2023-10-18 | Discharge: 2023-10-18 | Disposition: A | Discharge status: Home / Self Care | Source: Ambulatory Visit | Attending: Gastroenterology | Admitting: Gastroenterology

## 2023-10-18 DIAGNOSIS — K838 Other specified diseases of biliary tract: Secondary | ICD-10-CM | POA: Diagnosis not present

## 2023-10-18 DIAGNOSIS — K810 Acute cholecystitis: Secondary | ICD-10-CM | POA: Insufficient documentation

## 2023-10-18 DIAGNOSIS — K839 Disease of biliary tract, unspecified: Secondary | ICD-10-CM | POA: Diagnosis not present

## 2023-10-18 DIAGNOSIS — K8020 Calculus of gallbladder without cholecystitis without obstruction: Secondary | ICD-10-CM | POA: Diagnosis not present

## 2023-10-18 DIAGNOSIS — K5750 Diverticulosis of both small and large intestine without perforation or abscess without bleeding: Secondary | ICD-10-CM | POA: Diagnosis not present

## 2023-10-18 DIAGNOSIS — R160 Hepatomegaly, not elsewhere classified: Secondary | ICD-10-CM | POA: Diagnosis not present

## 2023-10-18 MED ORDER — IOHEXOL 300 MG/ML  SOLN
100.0000 mL | Freq: Once | INTRAMUSCULAR | Status: AC | PRN
  Administered 2023-10-18: 100 mL via INTRAVENOUS

## 2023-10-18 MED ORDER — SODIUM CHLORIDE (PF) 0.9 % IJ SOLN
INTRAMUSCULAR | Status: AC
  Filled 2023-10-18: qty 50

## 2023-10-31 ENCOUNTER — Ambulatory Visit: Payer: Self-pay | Admitting: General Surgery

## 2023-10-31 DIAGNOSIS — K819 Cholecystitis, unspecified: Secondary | ICD-10-CM | POA: Diagnosis not present

## 2023-11-01 ENCOUNTER — Other Ambulatory Visit: Payer: Self-pay | Admitting: Family

## 2023-12-04 ENCOUNTER — Ambulatory Visit: Payer: Self-pay | Admitting: Surgery

## 2023-12-04 DIAGNOSIS — K8020 Calculus of gallbladder without cholecystitis without obstruction: Secondary | ICD-10-CM | POA: Diagnosis not present

## 2023-12-04 NOTE — H&P (Signed)
 History of Present Illness: Tricia Dodson is a 73 y.o. female who is seen today for follow up of gallstones. She underwent a laparoscopic partial fenestrating cholecystectomy in January of this year by Dr. Davonna Estes for gangrenous cholecystitis. She developed a bile leak postoperatively, which was managed with ERCP and stent placement. Her drain was removed and on 4/3 she underwent a repeat ERCP with removal of the biliary stent. She was noted to have stones in the remnant gallbladder but no bile leak. Since surgery, she has continued to have abdominal discomfort. It is in the epigastric area and goes across the upper abdomen, sometimes to the back. This is is associated with nausea, diarrhea and bloating. She has cut out a lot of spicy and greasy foods, which has helped but has not relieved her symptoms. She has had two CT scans since surgery, most recently on 4/11, which have shown stones within her remnant gallbladder. She was referred to me to discuss completion cholecystectomy.   Her previous partial cholecystectomy is her only prior abdominal surgery. She does not take any blood thinners.         Review of Systems: A complete review of systems was obtained from the patient.  I have reviewed this information and discussed as appropriate with the patient.  See HPI as well for other ROS.       Medical History:  Past Medical History Past Medical History: Diagnosis Date  Arthritis    GERD (gastroesophageal reflux disease)    Hyperlipidemia    Hypertension    Sleep apnea    Thyroid  disease         Problem List Patient Active Problem List Diagnosis  Allergy  Arthritis  Carpal tunnel syndrome, bilateral  Chicken pox  Shingles  Colon polyps  CPAP (continuous positive airway pressure) dependence  CPAP use counseling  Dizziness  GERD (gastroesophageal reflux disease)  HA (headache)  History of vein stripping  Hyperlipidemia  Hypertension  Morbid obesity  (CMS/HHS-HCC)  Nonspecific abnormal electrocardiogram (ECG) (EKG)  OSA on CPAP  Plantar fasciitis, bilateral  Primary osteoarthritis of both knees  Retrognathia  Thyroid  disease  Torn ligament  UTI (urinary tract infection)       Past Surgical History Past Surgical History: Procedure Laterality Date  laparoscopic subtotal cholecystectomy with ICG    07/18/2023   Dr Davonna Estes  ENDOSCOPIC RETROGRADE CHOLANGIOPANCREATOGRAPHY (ERCP) WITH PROPOFOL   SPHINCTEROTOMY  BILIARY STENT PLACEMENT  PANCREATIC STENT PLACEMENT  REMOVAL OF SLUDGE    07/23/2023  ENDOSCOPIC RETROGRADE CHOLANGIOPANCREATOGRAPHY (ERCP) WITH PROPOFOL    10/10/2023  BIOPSY, SKIN, SUBCUTANEOUS TISSUE, OR MUCOUS MEMBRANE   10/10/2023  STENT REMOVAL                  ERCP, WITH LITHROTRIPSY OR REMOVAL OF COMMON BILE DUCT CALCULUS                   10/10/2023  DILATION AND CURETTAGE, DIAGNOSTIC / THERAPEUTIC      Foot Surgery  Bilateral    Hand Surgery  Right    HEMORROIDECTOMY      Vien Stripping  Right    Wrist Surgery  Bilateral         Allergies No Known Allergies     Medications Ordered Prior to Encounter Current Outpatient Medications on File Prior to Visit Medication Sig Dispense Refill  amitriptyline  (ELAVIL ) 10 MG tablet        atorvastatin  (LIPITOR) 10 MG tablet        fenofibrate  micronized (LOFIBRA)  134 MG capsule        gabapentin  (NEURONTIN ) 300 MG capsule        JARDIANCE  10 mg tablet        levocetirizine (XYZAL ) 5 MG tablet        levothyroxine  (SYNTHROID ) 25 MCG tablet        losartan  (COZAAR ) 50 MG tablet        omeprazole  (PRILOSEC) 40 MG DR capsule        triamcinolone  0.1 % cream APPLY TOPICALLY TO THE AFFECTED AREA DAILY        No current facility-administered medications on file prior to visit.       Family History No family history on file.      Tobacco Use History Social History    Tobacco Use Smoking Status Never Smokeless Tobacco Never       Social History Social  History    Socioeconomic History  Marital status: Single Tobacco Use  Smoking status: Never  Smokeless tobacco: Never Vaping Use  Vaping status: Never Used Substance and Sexual Activity  Alcohol use: Yes  Drug use: Never    Social Drivers of Manufacturing engineer Strain: Patient Declined (07/10/2023)   Received from [facility]   Overall Financial Resource Strain (CARDIA)    Difficulty of Paying Living Expenses: Patient declined Food Insecurity: Unknown (07/17/2023)   Received from [facility]   Hunger Vital Sign    Worried About Running Out of Food in the Last Year: Never true    Ran Out of Food in the Last Year: Patient declined Transportation Needs: No Transportation Needs (07/17/2023)   Received from [facility] - Transportation    Lack of Transportation (Medical): No    Lack of Transportation (Non-Medical): No Physical Activity: Insufficiently Active (07/10/2023)   Received from [facility]   Exercise Vital Sign    Days of Exercise per Week: 1 day    Minutes of Exercise per Session: 30 min Stress: No Stress Concern Present (07/10/2023)   Received from [facility] of Occupational Health - Occupational Stress Questionnaire    Feeling of Stress : Only a little Social Connections: Socially Isolated (07/17/2023)   Received from [facility]   Social Connection and Isolation Panel [NHANES]    Frequency of Communication with Friends and Family: Never    Frequency of Social Gatherings with Friends and Family: Never    Attends Religious Services: Never    Database administrator or Organizations: No    Attends Banker Meetings: Never    Marital Status: Never married Housing Stability: Unknown (08/09/2023)   Housing Stability Vital Sign    Homeless in the Last Year: No      Objective:     Vitals:   12/04/23 0944 BP: 127/83 Pulse: 71 Temp: 36.7 C (98 F) SpO2: 98% Weight: (!) 113.4 kg (250 lb) Height: 170.2 cm (5\' 7" )    Body mass index is 39.16 kg/m.   Physical Exam Constitutional:      General: She is not in acute distress.    Appearance: Normal appearance.  HENT:     Head: Normocephalic and atraumatic.  Eyes:     General: No scleral icterus.    Conjunctiva/sclera: Conjunctivae normal.  Cardiovascular:     Rate and Rhythm: Normal rate and regular rhythm.     Heart sounds: No murmur heard. Pulmonary:     Effort: Pulmonary effort is normal. No respiratory  distress.     Breath sounds: Normal breath sounds. No wheezing.  Abdominal:     General: There is no distension.     Palpations: Abdomen is soft.     Tenderness: There is no abdominal tenderness.     Comments: Well-healed port site surgical scars.  Skin:    General: Skin is warm and dry.     Coloration: Skin is not jaundiced.  Neurological:     General: No focal deficit present.     Mental Status: She is alert and oriented to person, place, and time.              Assessment and Plan:   Assessment Diagnoses and all orders for this visit:   Symptomatic cholelithiasis       73 yo female s/p partial cholecystectomy in January for gangrenous cholecystitis. She now has cholelithiasis in the remnant gallbladder, with persistent symptoms consistent with biliary colic. I personally reviewed her labs, imaging, procedure notes and referral notes. There was a significant remnant gallbladder on postop CT in February, which appears decompressed on most recent follow up CT, with stones in the remnant gallbladder. I discussed a completion cholecystectomy with the patient. I would plan for a robotic-assisted approach, but discussed the high probability of conversion to a laparotomy. I also reviewed the risks of injury to the small bowel, colon or common bile duct. She expressed understanding and agreed to proceed with surgery. She will be scheduled for completion cholecystectomy with ICG dye and possible cholangiogram next week. All questions were  answered.  Karleen Overall, MD [facility] Surgery General, Hepatobiliary and Pancreatic Surgery 12/04/23 10:15 AM

## 2023-12-04 NOTE — H&P (View-Only) (Signed)
 History of Present Illness: Tricia Dodson is a 73 y.o. female who is seen today for follow up of gallstones. She underwent a laparoscopic partial fenestrating cholecystectomy in January of this year by Dr. Davonna Estes for gangrenous cholecystitis. She developed a bile leak postoperatively, which was managed with ERCP and stent placement. Her drain was removed and on 4/3 she underwent a repeat ERCP with removal of the biliary stent. She was noted to have stones in the remnant gallbladder but no bile leak. Since surgery, she has continued to have abdominal discomfort. It is in the epigastric area and goes across the upper abdomen, sometimes to the back. This is is associated with nausea, diarrhea and bloating. She has cut out a lot of spicy and greasy foods, which has helped but has not relieved her symptoms. She has had two CT scans since surgery, most recently on 4/11, which have shown stones within her remnant gallbladder. She was referred to me to discuss completion cholecystectomy.   Her previous partial cholecystectomy is her only prior abdominal surgery. She does not take any blood thinners.         Review of Systems: A complete review of systems was obtained from the patient.  I have reviewed this information and discussed as appropriate with the patient.  See HPI as well for other ROS.       Medical History:  Past Medical History Past Medical History: Diagnosis Date  Arthritis    GERD (gastroesophageal reflux disease)    Hyperlipidemia    Hypertension    Sleep apnea    Thyroid  disease         Problem List Patient Active Problem List Diagnosis  Allergy  Arthritis  Carpal tunnel syndrome, bilateral  Chicken pox  Shingles  Colon polyps  CPAP (continuous positive airway pressure) dependence  CPAP use counseling  Dizziness  GERD (gastroesophageal reflux disease)  HA (headache)  History of vein stripping  Hyperlipidemia  Hypertension  Morbid obesity  (CMS/HHS-HCC)  Nonspecific abnormal electrocardiogram (ECG) (EKG)  OSA on CPAP  Plantar fasciitis, bilateral  Primary osteoarthritis of both knees  Retrognathia  Thyroid  disease  Torn ligament  UTI (urinary tract infection)       Past Surgical History Past Surgical History: Procedure Laterality Date  laparoscopic subtotal cholecystectomy with ICG    07/18/2023   Dr Davonna Estes  ENDOSCOPIC RETROGRADE CHOLANGIOPANCREATOGRAPHY (ERCP) WITH PROPOFOL   SPHINCTEROTOMY  BILIARY STENT PLACEMENT  PANCREATIC STENT PLACEMENT  REMOVAL OF SLUDGE    07/23/2023  ENDOSCOPIC RETROGRADE CHOLANGIOPANCREATOGRAPHY (ERCP) WITH PROPOFOL    10/10/2023  BIOPSY, SKIN, SUBCUTANEOUS TISSUE, OR MUCOUS MEMBRANE   10/10/2023  STENT REMOVAL                  ERCP, WITH LITHROTRIPSY OR REMOVAL OF COMMON BILE DUCT CALCULUS                   10/10/2023  DILATION AND CURETTAGE, DIAGNOSTIC / THERAPEUTIC      Foot Surgery  Bilateral    Hand Surgery  Right    HEMORROIDECTOMY      Vien Stripping  Right    Wrist Surgery  Bilateral         Allergies No Known Allergies     Medications Ordered Prior to Encounter Current Outpatient Medications on File Prior to Visit Medication Sig Dispense Refill  amitriptyline  (ELAVIL ) 10 MG tablet        atorvastatin  (LIPITOR) 10 MG tablet        fenofibrate  micronized (LOFIBRA)  134 MG capsule        gabapentin  (NEURONTIN ) 300 MG capsule        JARDIANCE  10 mg tablet        levocetirizine (XYZAL ) 5 MG tablet        levothyroxine  (SYNTHROID ) 25 MCG tablet        losartan  (COZAAR ) 50 MG tablet        omeprazole  (PRILOSEC) 40 MG DR capsule        triamcinolone  0.1 % cream APPLY TOPICALLY TO THE AFFECTED AREA DAILY        No current facility-administered medications on file prior to visit.       Family History No family history on file.      Tobacco Use History Social History    Tobacco Use Smoking Status Never Smokeless Tobacco Never       Social History Social  History    Socioeconomic History  Marital status: Single Tobacco Use  Smoking status: Never  Smokeless tobacco: Never Vaping Use  Vaping status: Never Used Substance and Sexual Activity  Alcohol use: Yes  Drug use: Never    Social Drivers of Manufacturing engineer Strain: Patient Declined (07/10/2023)   Received from [facility]   Overall Financial Resource Strain (CARDIA)    Difficulty of Paying Living Expenses: Patient declined Food Insecurity: Unknown (07/17/2023)   Received from [facility]   Hunger Vital Sign    Worried About Running Out of Food in the Last Year: Never true    Ran Out of Food in the Last Year: Patient declined Transportation Needs: No Transportation Needs (07/17/2023)   Received from [facility] - Transportation    Lack of Transportation (Medical): No    Lack of Transportation (Non-Medical): No Physical Activity: Insufficiently Active (07/10/2023)   Received from [facility]   Exercise Vital Sign    Days of Exercise per Week: 1 day    Minutes of Exercise per Session: 30 min Stress: No Stress Concern Present (07/10/2023)   Received from [facility] of Occupational Health - Occupational Stress Questionnaire    Feeling of Stress : Only a little Social Connections: Socially Isolated (07/17/2023)   Received from [facility]   Social Connection and Isolation Panel [NHANES]    Frequency of Communication with Friends and Family: Never    Frequency of Social Gatherings with Friends and Family: Never    Attends Religious Services: Never    Database administrator or Organizations: No    Attends Banker Meetings: Never    Marital Status: Never married Housing Stability: Unknown (08/09/2023)   Housing Stability Vital Sign    Homeless in the Last Year: No      Objective:     Vitals:   12/04/23 0944 BP: 127/83 Pulse: 71 Temp: 36.7 C (98 F) SpO2: 98% Weight: (!) 113.4 kg (250 lb) Height: 170.2 cm (5\' 7" )    Body mass index is 39.16 kg/m.   Physical Exam Constitutional:      General: She is not in acute distress.    Appearance: Normal appearance.  HENT:     Head: Normocephalic and atraumatic.  Eyes:     General: No scleral icterus.    Conjunctiva/sclera: Conjunctivae normal.  Cardiovascular:     Rate and Rhythm: Normal rate and regular rhythm.     Heart sounds: No murmur heard. Pulmonary:     Effort: Pulmonary effort is normal. No respiratory  distress.     Breath sounds: Normal breath sounds. No wheezing.  Abdominal:     General: There is no distension.     Palpations: Abdomen is soft.     Tenderness: There is no abdominal tenderness.     Comments: Well-healed port site surgical scars.  Skin:    General: Skin is warm and dry.     Coloration: Skin is not jaundiced.  Neurological:     General: No focal deficit present.     Mental Status: She is alert and oriented to person, place, and time.              Assessment and Plan:   Assessment Diagnoses and all orders for this visit:   Symptomatic cholelithiasis       73 yo female s/p partial cholecystectomy in January for gangrenous cholecystitis. She now has cholelithiasis in the remnant gallbladder, with persistent symptoms consistent with biliary colic. I personally reviewed her labs, imaging, procedure notes and referral notes. There was a significant remnant gallbladder on postop CT in February, which appears decompressed on most recent follow up CT, with stones in the remnant gallbladder. I discussed a completion cholecystectomy with the patient. I would plan for a robotic-assisted approach, but discussed the high probability of conversion to a laparotomy. I also reviewed the risks of injury to the small bowel, colon or common bile duct. She expressed understanding and agreed to proceed with surgery. She will be scheduled for completion cholecystectomy with ICG dye and possible cholangiogram next week. All questions were  answered.  Karleen Overall, MD [facility] Surgery General, Hepatobiliary and Pancreatic Surgery 12/04/23 10:15 AM

## 2023-12-06 NOTE — Pre-Procedure Instructions (Addendum)
 Surgical Instructions   Your procedure is scheduled on December 10, 2023. Report to [facility] Main Entrance "A" at 7:30 A.M., then check in with the Admitting office. Any questions or running late day of surgery: call [phone]  Questions prior to your surgery date: call [phone], Monday-Friday, 8am-4pm. If you experience any cold or flu symptoms such as cough, fever, chills, shortness of breath, etc. between now and your scheduled surgery, please notify us  at the above number.     Remember:  Do not eat after midnight the night before your surgery   You may drink clear liquids until 6:30 AM the morning of your surgery.   Clear liquids allowed are: Water, Non-Citrus Juices (without pulp), Carbonated Beverages, Clear Tea (no milk, honey, etc.), Black Coffee Only (NO MILK, CREAM OR POWDERED CREAMER of any kind), and Gatorade.    Take these medicines the morning of surgery with A SIP OF WATER: atorvastatin  (LIPITOR)  DULoxetine  (CYMBALTA )  fenofibrate  micronized (LOFIBRA)  gabapentin  (NEURONTIN )  omeprazole  (PRILOSEC)    May take these medicines IF NEEDED: ipratropium (ATROVENT ) nasal spray    One week prior to surgery, STOP taking any Aspirin (unless otherwise instructed by your surgeon) Aleve , Naproxen , Ibuprofen, Motrin, Advil, Goody's, BC's, all herbal medications, fish oil, and non-prescription vitamins.   WHAT DO I DO ABOUT MY DIABETES MEDICATION?   STOP taking your empagliflozin  (JARDIANCE ) three days prior to surgery. Your last dose will be May 30th.      HOW TO MANAGE YOUR DIABETES BEFORE AND AFTER SURGERY  Why is it important to control my blood sugar before and after surgery? Improving blood sugar levels before and after surgery helps healing and can limit problems. A way of improving blood sugar control is eating a healthy diet by:  Eating less sugar and carbohydrates  Increasing activity/exercise  Talking with your doctor about reaching your blood sugar  goals High blood sugars (greater than 180 mg/dL) can raise your risk of infections and slow your recovery, so you will need to focus on controlling your diabetes during the weeks before surgery. Make sure that the doctor who takes care of your diabetes knows about your planned surgery including the date and location.  How do I manage my blood sugar before surgery? Check your blood sugar at least 4 times a day, starting 2 days before surgery, to make sure that the level is not too high or low.  Check your blood sugar the morning of your surgery when you wake up and every 2 hours until you get to the Short Stay unit.  If your blood sugar is less than 70 mg/dL, you will need to treat for low blood sugar: Do not take insulin . Treat a low blood sugar (less than 70 mg/dL) with  cup of clear juice (cranberry or apple), 4 glucose tablets, OR glucose gel. Recheck blood sugar in 15 minutes after treatment (to make sure it is greater than 70 mg/dL). If your blood sugar is not greater than 70 mg/dL on recheck, call [phone] for further instructions. Report your blood sugar to the short stay nurse when you get to Short Stay.  If you are admitted to the hospital after surgery: Your blood sugar will be checked by the staff and you will probably be given insulin  after surgery (instead of oral diabetes medicines) to make sure you have good blood sugar levels. The goal for blood sugar control after surgery is 80-180 mg/dL.  Do NOT Smoke (Tobacco/Vaping) for 24 hours prior to your procedure.  If you use a CPAP at night, you may bring your mask/headgear for your overnight stay.   You will be asked to remove any contacts, glasses, piercing's, hearing aid's, dentures/partials prior to surgery. Please bring cases for these items if needed.    Patients discharged the day of surgery will not be allowed to drive home, and someone needs to stay with them for 24 hours.  SURGICAL WAITING  ROOM VISITATION Patients may have no more than 2 support people in the waiting area - these visitors may rotate.   Pre-op nurse will coordinate an appropriate time for 1 ADULT support person, who may not rotate, to accompany patient in pre-op.  Children under the age of 42 must have an adult with them who is not the patient and must remain in the main waiting area with an adult.  If the patient needs to stay at the hospital during part of their recovery, the visitor guidelines for inpatient rooms apply.  Please refer to the [facility] website for the visitor guidelines for any additional information.   If you received a COVID test during your pre-op visit  it is requested that you wear a mask when out in public, stay away from anyone that may not be feeling well and notify your surgeon if you develop symptoms. If you have been in contact with anyone that has tested positive in the last 10 days please notify you surgeon.      Pre-operative CHG Bathing Instructions   You can play a key role in reducing the risk of infection after surgery. Your skin needs to be as free of germs as possible. You can reduce the number of germs on your skin by washing with CHG (chlorhexidine  gluconate) soap before surgery. CHG is an antiseptic soap that kills germs and continues to kill germs even after washing.   DO NOT use if you have an allergy to chlorhexidine /CHG or antibacterial soaps. If your skin becomes reddened or irritated, stop using the CHG and notify one of our RNs at [phone].              TAKE A SHOWER THE NIGHT BEFORE SURGERY AND THE DAY OF SURGERY    Please keep in mind the following:  DO NOT shave, including legs and underarms, 48 hours prior to surgery.   You may shave your face before/day of surgery.  Place clean sheets on your bed the night before surgery Use a clean washcloth (not used since being washed) for each shower. DO NOT sleep with pet's night before surgery.  CHG Shower  Instructions:  Wash your face and private area with normal soap. If you choose to wash your hair, wash first with your normal shampoo.  After you use shampoo/soap, rinse your hair and body thoroughly to remove shampoo/soap residue.  Turn the water OFF and apply half the bottle of CHG soap to a CLEAN washcloth.  Apply CHG soap ONLY FROM YOUR NECK DOWN TO YOUR TOES (washing for 3-5 minutes)  DO NOT use CHG soap on face, private areas, open wounds, or sores.  Pay special attention to the area where your surgery is being performed.  If you are having back surgery, having someone wash your back for you may be helpful. Wait 2 minutes after CHG soap is applied, then you may rinse off the CHG soap.  Pat dry with a clean towel  Put on clean pajamas  Additional instructions for the day of surgery: DO NOT APPLY any lotions, deodorants, cologne, or perfumes.   Do not wear jewelry or makeup Do not wear nail polish, gel polish, artificial nails, or any other type of covering on natural nails (fingers and toes) Do not bring valuables to the hospital. [facility] is not responsible for valuables/personal belongings. Put on clean/comfortable clothes.  Please brush your teeth.  Ask your nurse before applying any prescription medications to the skin.

## 2023-12-09 ENCOUNTER — Encounter (HOSPITAL_COMMUNITY): Payer: Self-pay

## 2023-12-09 ENCOUNTER — Encounter (HOSPITAL_COMMUNITY)
Admission: RE | Admit: 2023-12-09 | Discharge: 2023-12-09 | Disposition: A | Discharge status: Home / Self Care | Source: Ambulatory Visit | Attending: Surgery | Admitting: Surgery

## 2023-12-09 ENCOUNTER — Other Ambulatory Visit: Payer: Self-pay

## 2023-12-09 VITALS — BP 120/84 | HR 82 | Temp 97.6°F | Resp 17 | Ht 67.0 in | Wt 250.9 lb

## 2023-12-09 DIAGNOSIS — Z01812 Encounter for preprocedural laboratory examination: Secondary | ICD-10-CM | POA: Diagnosis not present

## 2023-12-09 DIAGNOSIS — E119 Type 2 diabetes mellitus without complications: Secondary | ICD-10-CM | POA: Insufficient documentation

## 2023-12-09 HISTORY — DX: Family history of other specified conditions: Z84.89

## 2023-12-09 HISTORY — DX: Hypothyroidism, unspecified: E03.9

## 2023-12-09 LAB — BASIC METABOLIC PANEL WITH GFR
Anion gap: 9 (ref 5–15)
BUN: 16 mg/dL (ref 8–23)
CO2: 27 mmol/L (ref 22–32)
Calcium: 9.3 mg/dL (ref 8.9–10.3)
Chloride: 103 mmol/L (ref 98–111)
Creatinine, Ser: 1.08 mg/dL — ABNORMAL HIGH (ref 0.44–1.00)
GFR, Estimated: 55 mL/min — ABNORMAL LOW (ref >60)
Glucose, Bld: 183 mg/dL — ABNORMAL HIGH (ref 70–99)
Potassium: 4.5 mmol/L (ref 3.5–5.1)
Sodium: 139 mmol/L (ref 135–145)

## 2023-12-09 LAB — CBC
HCT: 45.1 % (ref 36.0–46.0)
Hemoglobin: 14.6 g/dL (ref 12.0–15.0)
MCH: 27.7 pg (ref 26.0–34.0)
MCHC: 32.4 g/dL (ref 30.0–36.0)
MCV: 85.4 fL (ref 80.0–100.0)
Platelets: 331 10*3/uL (ref 150–400)
RBC: 5.28 MIL/uL — ABNORMAL HIGH (ref 3.87–5.11)
RDW: 13.2 % (ref 11.5–15.5)
WBC: 6.8 10*3/uL (ref 4.0–10.5)
nRBC: 0 % (ref 0.0–0.2)

## 2023-12-09 LAB — GLUCOSE, CAPILLARY: Glucose-Capillary: 225 mg/dL — ABNORMAL HIGH (ref 70–99)

## 2023-12-09 LAB — HEMOGLOBIN A1C
Hgb A1c MFr Bld: 7.6 % — ABNORMAL HIGH (ref 4.8–5.6)
Mean Plasma Glucose: 171.42 mg/dL

## 2023-12-09 NOTE — Progress Notes (Signed)
 PCP - Adra Alanis Cardiologist - Dr. Ralene Burger - Last office visit 04/12/2021 Neurology - Clem Currier, NP - manages OSA  PPM/ICD - Denies Device Orders - n/a Rep Notified - n/a  Chest x-ray - 08/08/2023 EKG - 07/16/2023 Stress Test - Denies ECHO - 07/19/2023 Cardiac Cath - Denies  Sleep Study - +OSA. Pt wears CPAP nightly. Pressure setting is 4-5  Pt is DM2. She does not have a glucose meter at home, thus doesn't know fasting glucose range. CBG at pre-op 225. Pt has only had coffee with flavored creamer this morning. A1c result pending.   Last dose of GLP1 agonist- n/a GLP1 instructions: n/a SGLT-2 Instructions: Pt has already stopped her Jardiance . Last dose was May 31st  Blood Thinner Instructions: n/a Aspirin Instructions: n/a  ERAS Protcol - Clear liquids until 0630 morning of surgery PRE-SURGERY Ensure or G2- n/a  COVID TEST- n/a   Anesthesia review: No.   Patient denies shortness of breath, fever, cough and chest pain at PAT appointment. Pt denies any respiratory illness/infection in the last two months.   All instructions explained to the patient, with a verbal understanding of the material. Patient agrees to go over the instructions while at home for a better understanding. Patient also instructed to self quarantine after being tested for COVID-19. The opportunity to ask questions was provided.

## 2023-12-10 ENCOUNTER — Encounter (HOSPITAL_COMMUNITY): Payer: Self-pay | Admitting: Surgery

## 2023-12-10 ENCOUNTER — Ambulatory Visit (HOSPITAL_COMMUNITY)
Admission: RE | Admit: 2023-12-10 | Discharge: 2023-12-10 | Disposition: A | Discharge status: Home / Self Care | Attending: Surgery | Admitting: Surgery

## 2023-12-10 ENCOUNTER — Ambulatory Visit (HOSPITAL_COMMUNITY)

## 2023-12-10 ENCOUNTER — Other Ambulatory Visit: Payer: Self-pay

## 2023-12-10 ENCOUNTER — Encounter (HOSPITAL_COMMUNITY)
Admission: RE | Disposition: A | Discharge status: Home / Self Care | Payer: Self-pay | Source: Home / Self Care | Attending: Surgery

## 2023-12-10 DIAGNOSIS — E119 Type 2 diabetes mellitus without complications: Secondary | ICD-10-CM | POA: Diagnosis not present

## 2023-12-10 DIAGNOSIS — K8010 Calculus of gallbladder with chronic cholecystitis without obstruction: Secondary | ICD-10-CM | POA: Insufficient documentation

## 2023-12-10 DIAGNOSIS — K219 Gastro-esophageal reflux disease without esophagitis: Secondary | ICD-10-CM | POA: Diagnosis not present

## 2023-12-10 DIAGNOSIS — Z6839 Body mass index (BMI) 39.0-39.9, adult: Secondary | ICD-10-CM | POA: Insufficient documentation

## 2023-12-10 DIAGNOSIS — K819 Cholecystitis, unspecified: Secondary | ICD-10-CM | POA: Diagnosis present

## 2023-12-10 DIAGNOSIS — K8020 Calculus of gallbladder without cholecystitis without obstruction: Secondary | ICD-10-CM | POA: Diagnosis not present

## 2023-12-10 DIAGNOSIS — E785 Hyperlipidemia, unspecified: Secondary | ICD-10-CM | POA: Diagnosis not present

## 2023-12-10 DIAGNOSIS — I10 Essential (primary) hypertension: Secondary | ICD-10-CM | POA: Insufficient documentation

## 2023-12-10 DIAGNOSIS — G4733 Obstructive sleep apnea (adult) (pediatric): Secondary | ICD-10-CM | POA: Insufficient documentation

## 2023-12-10 DIAGNOSIS — Z7984 Long term (current) use of oral hypoglycemic drugs: Secondary | ICD-10-CM | POA: Diagnosis not present

## 2023-12-10 LAB — GLUCOSE, CAPILLARY
Glucose-Capillary: 186 mg/dL — ABNORMAL HIGH (ref 70–99)
Glucose-Capillary: 148 mg/dL — ABNORMAL HIGH (ref 70–99)
Glucose-Capillary: 222 mg/dL — ABNORMAL HIGH (ref 70–99)

## 2023-12-10 SURGERY — CHOLECYSTECTOMY, ROBOT-ASSISTED, LAPAROSCOPIC
Anesthesia: General | Site: Abdomen

## 2023-12-10 MED ORDER — PHENYLEPHRINE 80 MCG/ML (10ML) SYRINGE FOR IV PUSH (FOR BLOOD PRESSURE SUPPORT)
PREFILLED_SYRINGE | INTRAVENOUS | Status: AC
  Filled 2023-12-10: qty 10

## 2023-12-10 MED ORDER — CEFAZOLIN SODIUM-DEXTROSE 2-4 GM/100ML-% IV SOLN
2.0000 g | INTRAVENOUS | Status: AC
  Administered 2023-12-10: 2 g via INTRAVENOUS
  Filled 2023-12-10: qty 100

## 2023-12-10 MED ORDER — KETAMINE HCL 50 MG/5ML IJ SOSY
PREFILLED_SYRINGE | INTRAMUSCULAR | Status: AC
Start: 2023-12-10 — End: ?
  Filled 2023-12-10: qty 5

## 2023-12-10 MED ORDER — PHENYLEPHRINE HCL-NACL 20-0.9 MG/250ML-% IV SOLN
INTRAVENOUS | Status: DC | PRN
  Administered 2023-12-10: 30 ug/min via INTRAVENOUS

## 2023-12-10 MED ORDER — ORAL CARE MOUTH RINSE: 15.0000 mL | Freq: Once | OROMUCOSAL | Status: AC

## 2023-12-10 MED ORDER — BUPIVACAINE-EPINEPHRINE 0.25% -1:200000 IJ SOLN
INTRAMUSCULAR | Status: DC | PRN
  Administered 2023-12-10: 30 mL

## 2023-12-10 MED ORDER — DEXAMETHASONE SODIUM PHOSPHATE 10 MG/ML IJ SOLN
INTRAMUSCULAR | Status: AC
Start: 2023-12-10 — End: ?
  Filled 2023-12-10: qty 1

## 2023-12-10 MED ORDER — EPHEDRINE SULFATE-NACL 50-0.9 MG/10ML-% IV SOSY
PREFILLED_SYRINGE | INTRAVENOUS | Status: DC | PRN
  Administered 2023-12-10 (×2): 10 mg via INTRAVENOUS

## 2023-12-10 MED ORDER — LIDOCAINE 2% (20 MG/ML) 5 ML SYRINGE
INTRAMUSCULAR | Status: AC
  Filled 2023-12-10: qty 5

## 2023-12-10 MED ORDER — LIDOCAINE 2% (20 MG/ML) 5 ML SYRINGE
INTRAMUSCULAR | Status: DC | PRN
  Administered 2023-12-10: 20 mg via INTRAVENOUS
  Administered 2023-12-10: 80 mg via INTRAVENOUS

## 2023-12-10 MED ORDER — HYDROMORPHONE HCL 1 MG/ML IJ SOLN: 0.2500 mg | INTRAMUSCULAR | Status: DC | PRN

## 2023-12-10 MED ORDER — KETAMINE HCL 10 MG/ML IJ SOLN
INTRAMUSCULAR | Status: DC | PRN
  Administered 2023-12-10: 50 mg via INTRAVENOUS

## 2023-12-10 MED ORDER — DEXAMETHASONE SODIUM PHOSPHATE 10 MG/ML IJ SOLN
INTRAMUSCULAR | Status: DC | PRN
  Administered 2023-12-10: 5 mg via INTRAVENOUS

## 2023-12-10 MED ORDER — PROPOFOL 10 MG/ML IV BOLUS
INTRAVENOUS | Status: DC | PRN
  Administered 2023-12-10: 150 mg via INTRAVENOUS

## 2023-12-10 MED ORDER — FENTANYL CITRATE (PF) 250 MCG/5ML IJ SOLN
INTRAMUSCULAR | Status: AC
  Filled 2023-12-10: qty 5

## 2023-12-10 MED ORDER — MIDAZOLAM HCL 2 MG/2ML IJ SOLN
INTRAMUSCULAR | Status: DC | PRN
  Administered 2023-12-10: 2 mg via INTRAVENOUS

## 2023-12-10 MED ORDER — INSULIN ASPART 100 UNIT/ML IJ SOLN: 0.0000 [IU] | INTRAMUSCULAR | Status: DC | PRN

## 2023-12-10 MED ORDER — OXYCODONE HCL 5 MG/5ML PO SOLN: 5.0000 mg | Freq: Once | ORAL | Status: DC | PRN

## 2023-12-10 MED ORDER — ACETAMINOPHEN 500 MG PO TABS: 1000.0000 mg | ORAL_TABLET | ORAL | Status: DC

## 2023-12-10 MED ORDER — SODIUM CHLORIDE 0.9 % IR SOLN
Status: DC | PRN
  Administered 2023-12-10: 1000 mL

## 2023-12-10 MED ORDER — SUGAMMADEX SODIUM 200 MG/2ML IV SOLN
INTRAVENOUS | Status: DC | PRN
  Administered 2023-12-10: 400 mg via INTRAVENOUS

## 2023-12-10 MED ORDER — ACETAMINOPHEN 500 MG PO TABS
1000.0000 mg | ORAL_TABLET | Freq: Once | ORAL | Status: AC
  Administered 2023-12-10: 1000 mg via ORAL
  Filled 2023-12-10: qty 2

## 2023-12-10 MED ORDER — ONDANSETRON HCL 4 MG/2ML IJ SOLN
INTRAMUSCULAR | Status: DC | PRN
  Administered 2023-12-10: 4 mg via INTRAVENOUS

## 2023-12-10 MED ORDER — SPY AGENT GREEN - (INDOCYANINE FOR INJECTION)
1.2500 mg | Freq: Once | INTRAMUSCULAR | Status: AC
  Administered 2023-12-10: 1.25 mg via INTRAVENOUS
  Filled 2023-12-10: qty 10

## 2023-12-10 MED ORDER — EPHEDRINE 5 MG/ML INJ
INTRAVENOUS | Status: AC
Start: 2023-12-10 — End: ?
  Filled 2023-12-10: qty 5

## 2023-12-10 MED ORDER — 0.9 % SODIUM CHLORIDE (POUR BTL) OPTIME
TOPICAL | Status: DC | PRN
  Administered 2023-12-10: 1000 mL

## 2023-12-10 MED ORDER — OXYCODONE HCL 5 MG PO TABS: 5.0000 mg | ORAL_TABLET | Freq: Four times a day (QID) | ORAL | 0 refills | Status: AC | PRN

## 2023-12-10 MED ORDER — LACTATED RINGERS IV SOLN: INTRAVENOUS | Status: DC

## 2023-12-10 MED ORDER — PHENYLEPHRINE 80 MCG/ML (10ML) SYRINGE FOR IV PUSH (FOR BLOOD PRESSURE SUPPORT)
PREFILLED_SYRINGE | INTRAVENOUS | Status: DC | PRN
  Administered 2023-12-10: 160 ug via INTRAVENOUS

## 2023-12-10 MED ORDER — HYDROMORPHONE HCL 1 MG/ML IJ SOLN
INTRAMUSCULAR | Status: AC
  Filled 2023-12-10: qty 0.5

## 2023-12-10 MED ORDER — PROPOFOL 10 MG/ML IV BOLUS
INTRAVENOUS | Status: AC
  Filled 2023-12-10: qty 20

## 2023-12-10 MED ORDER — GLYCOPYRROLATE 0.2 MG/ML IJ SOLN
INTRAMUSCULAR | Status: DC | PRN
  Administered 2023-12-10: .2 mg via INTRAVENOUS

## 2023-12-10 MED ORDER — FENTANYL CITRATE (PF) 250 MCG/5ML IJ SOLN
INTRAMUSCULAR | Status: DC | PRN
  Administered 2023-12-10: 50 ug via INTRAVENOUS
  Administered 2023-12-10: 100 ug via INTRAVENOUS
  Administered 2023-12-10 (×2): 50 ug via INTRAVENOUS

## 2023-12-10 MED ORDER — ONDANSETRON HCL 4 MG/2ML IJ SOLN
INTRAMUSCULAR | Status: AC
Start: 2023-12-10 — End: ?
  Filled 2023-12-10: qty 2

## 2023-12-10 MED ORDER — CHLORHEXIDINE GLUCONATE 0.12 % MT SOLN
15.0000 mL | Freq: Once | OROMUCOSAL | Status: AC
  Administered 2023-12-10: 15 mL via OROMUCOSAL
  Filled 2023-12-10: qty 15

## 2023-12-10 MED ORDER — BUPIVACAINE-EPINEPHRINE (PF) 0.25% -1:200000 IJ SOLN
INTRAMUSCULAR | Status: AC
  Filled 2023-12-10: qty 30

## 2023-12-10 MED ORDER — ROCURONIUM BROMIDE 10 MG/ML (PF) SYRINGE
PREFILLED_SYRINGE | INTRAVENOUS | Status: AC
Start: 2023-12-10 — End: ?
  Filled 2023-12-10: qty 10

## 2023-12-10 MED ORDER — MIDAZOLAM HCL 2 MG/2ML IJ SOLN
INTRAMUSCULAR | Status: AC
  Filled 2023-12-10: qty 2

## 2023-12-10 MED ORDER — OXYCODONE HCL 5 MG PO TABS: 5.0000 mg | ORAL_TABLET | Freq: Once | ORAL | Status: DC | PRN

## 2023-12-10 MED ORDER — AMISULPRIDE (ANTIEMETIC) 5 MG/2ML IV SOLN: 10.0000 mg | Freq: Once | INTRAVENOUS | Status: DC | PRN

## 2023-12-10 MED ORDER — ONDANSETRON HCL 4 MG/2ML IJ SOLN: 4.0000 mg | Freq: Once | INTRAMUSCULAR | Status: DC | PRN

## 2023-12-10 MED ORDER — ROCURONIUM BROMIDE 10 MG/ML (PF) SYRINGE
PREFILLED_SYRINGE | INTRAVENOUS | Status: DC | PRN
  Administered 2023-12-10: 80 mg via INTRAVENOUS

## 2023-12-10 MED ORDER — HYDROMORPHONE HCL 1 MG/ML IJ SOLN
INTRAMUSCULAR | Status: DC | PRN
  Administered 2023-12-10: .5 mg via INTRAVENOUS

## 2023-12-10 SURGICAL SUPPLY — 53 items
BAG COUNTER SPONGE SURGICOUNT (BAG) IMPLANT
BLADE EXTENDED COATED 6.5IN (ELECTRODE) IMPLANT
CANNULA REDUCER 12-8 DVNC XI (CANNULA) IMPLANT
CHLORAPREP W/TINT 26 (MISCELLANEOUS) ×1 IMPLANT
CLIP LIGATING HEM O LOK PURPLE (MISCELLANEOUS) IMPLANT
CLIP LIGATING HEMO O LOK GREEN (MISCELLANEOUS) ×1 IMPLANT
CLIP LIGATING HEMOLOK MED (MISCELLANEOUS) IMPLANT
COVER MAYO STAND STRL (DRAPES) ×1 IMPLANT
COVER SURGICAL LIGHT HANDLE (MISCELLANEOUS) ×1 IMPLANT
COVER TIP SHEARS 8 DVNC (MISCELLANEOUS) ×1 IMPLANT
DEFOGGER SCOPE WARM SEASHARP (MISCELLANEOUS) ×1 IMPLANT
DERMABOND ADVANCED .7 DNX12 (GAUZE/BANDAGES/DRESSINGS) ×1 IMPLANT
DRAPE ARM DVNC X/XI (DISPOSABLE) ×4 IMPLANT
DRAPE COLUMN DVNC XI (DISPOSABLE) ×1 IMPLANT
DRAPE CV SPLIT W-CLR ANES SCRN (DRAPES) ×1 IMPLANT
DRAPE SURG ORHT 6 SPLT 77X108 (DRAPES) ×1 IMPLANT
ELECTRODE REM PT RTRN 9FT ADLT (ELECTROSURGICAL) ×1 IMPLANT
ENDOLOOP SUT PDS II 0 18 (SUTURE) IMPLANT
FORCEPS BPLR FENES DVNC XI (FORCEP) ×1 IMPLANT
FORCEPS PROGRASP DVNC XI (FORCEP) ×1 IMPLANT
GAUZE SPONGE 4X4 12PLY STRL (GAUZE/BANDAGES/DRESSINGS) IMPLANT
GLOVE BIOGEL PI MICRO STRL 5.5 (GLOVE) ×2 IMPLANT
GLOVE SURG UNDER LTX SZ6.5 (GLOVE) ×2 IMPLANT
GOWN STRL REUS W/ TWL LRG LVL3 (GOWN DISPOSABLE) ×2 IMPLANT
GOWN STRL REUS W/TWL 2XL LVL3 (GOWN DISPOSABLE) ×1 IMPLANT
GRASPER SUT TROCAR 14GX15 (MISCELLANEOUS) ×1 IMPLANT
IRRIGATION SUCT STRKRFLW 2 WTP (MISCELLANEOUS) ×1 IMPLANT
IRRIGATOR SUCT 8 DISP DVNC XI (IRRIGATION / IRRIGATOR) IMPLANT
KIT BASIN OR (CUSTOM PROCEDURE TRAY) ×1 IMPLANT
KIT IMAGING PINPOINTPAQ (MISCELLANEOUS) IMPLANT
KIT TURNOVER KIT B (KITS) IMPLANT
LHOOK LAP DISP 36CM (ELECTROSURGICAL) IMPLANT
NDL HYPO 22X1.5 SAFETY MO (MISCELLANEOUS) ×1 IMPLANT
NDL INSUFFLATION 14GA 120MM (NEEDLE) ×1 IMPLANT
NEEDLE HYPO 22X1.5 SAFETY MO (MISCELLANEOUS) ×1 IMPLANT
NEEDLE INSUFFLATION 14GA 120MM (NEEDLE) ×1 IMPLANT
OBTURATOR OPTICALSTD 8 DVNC (TROCAR) ×1 IMPLANT
PAD ARMBOARD POSITIONER FOAM (MISCELLANEOUS) ×2 IMPLANT
PENCIL BUTTON HOLSTER BLD 10FT (ELECTRODE) ×1 IMPLANT
PENCIL SMOKE EVACUATOR (MISCELLANEOUS) IMPLANT
POUCH RETRIEVAL ECOSAC 10 (ENDOMECHANICALS) IMPLANT
SCISSORS LAP 5X35 DISP (ENDOMECHANICALS) IMPLANT
SCISSORS MNPLR CVD DVNC XI (INSTRUMENTS) ×1 IMPLANT
SEAL UNIV 5-12 XI (MISCELLANEOUS) ×4 IMPLANT
SET TUBE SMOKE EVAC HIGH FLOW (TUBING) ×1 IMPLANT
SPIKE FLUID TRANSFER (MISCELLANEOUS) ×1 IMPLANT
SPONGE T-LAP 18X18 ~~LOC~~+RFID (SPONGE) IMPLANT
STOPCOCK 4 WAY LG BORE MALE ST (IV SETS) ×1 IMPLANT
SUT MNCRL AB 4-0 PS2 18 (SUTURE) ×1 IMPLANT
SUT VICRYL 0 UR6 27IN ABS (SUTURE) IMPLANT
SYSTEM BAG RETRIEVAL 10MM (BASKET) IMPLANT
TOWEL GREEN STERILE FF (TOWEL DISPOSABLE) ×1 IMPLANT
TRAY LAPAROSCOPIC MC (CUSTOM PROCEDURE TRAY) ×1 IMPLANT

## 2023-12-10 NOTE — Transfer of Care (Signed)
 Immediate Anesthesia Transfer of Care Note  Patient: Tricia Dodson  Procedure(s) Performed: CHOLECYSTECTOMY, ROBOT-ASSISTED, LAPAROSCOPIC (Abdomen)  Patient Location: PACU  Anesthesia Type:General  Level of Consciousness: sedated  Airway & Oxygen Therapy: Patient Spontanous Breathing and Patient connected to nasal cannula oxygen  Post-op Assessment: Report given to RN and Post -op Vital signs reviewed and stable  Post vital signs: Reviewed and stable  Last Vitals:  Vitals Value Taken Time  BP    Temp    Pulse    Resp 12   SpO2 96     Last Pain:  Vitals:   12/10/23 0832  TempSrc:   PainSc: 0-No pain         Complications: No notable events documented.

## 2023-12-10 NOTE — Anesthesia Procedure Notes (Signed)
 Procedure Name: Intubation Date/Time: 12/10/2023 10:18 AM  Performed by: Viki Graver, CRNAPre-anesthesia Checklist: Patient identified, Emergency Drugs available, Suction available and Patient being monitored Patient Re-evaluated:Patient Re-evaluated prior to induction Oxygen Delivery Method: Circle System Utilized Preoxygenation: Pre-oxygenation with 100% oxygen Induction Type: IV induction Ventilation: Mask ventilation without difficulty Laryngoscope Size: Miller and 2 Grade View: Grade II Tube type: Oral Tube size: 7.0 mm Number of attempts: 1 Airway Equipment and Method: Stylet and Oral airway Placement Confirmation: ETT inserted through vocal cords under direct vision, positive ETCO2 and breath sounds checked- equal and bilateral Secured at: 22 cm Tube secured with: Tape Dental Injury: Teeth and Oropharynx as per pre-operative assessment

## 2023-12-10 NOTE — Anesthesia Preprocedure Evaluation (Addendum)
 Anesthesia Evaluation  Patient identified by MRN, date of birth, ID band Patient awake    Reviewed: Allergy & Precautions, H&P , NPO status , Patient's Chart, lab work & pertinent test results  History of Anesthesia Complications Negative for: history of anesthetic complications  Airway Mallampati: III  TM Distance: >3 FB Neck ROM: Full    Dental  (+) Dental Advisory Given, Missing   Pulmonary sleep apnea and Continuous Positive Airway Pressure Ventilation    Pulmonary exam normal breath sounds clear to auscultation       Cardiovascular hypertension (120/84 preop), Pt. on medications Normal cardiovascular exam Rhythm:Regular Rate:Normal  Echo 07/2023  1. Left ventricular ejection fraction, by estimation, is 60 to 65%. The  left ventricle has normal function. The left ventricle has no regional  wall motion abnormalities. There is mild left ventricular hypertrophy.  Left ventricular diastolic parameters  are consistent with Grade I diastolic dysfunction (impaired relaxation).   2. Right ventricular systolic function is normal. The right ventricular  size is normal.   3. The mitral valve is normal in structure. Trivial mitral valve  regurgitation. No evidence of mitral stenosis.   4. The aortic valve is tricuspid. There is mild calcification of the  aortic valve. Aortic valve regurgitation is not visualized. Aortic valve  sclerosis/calcification is present, without any evidence of aortic  stenosis.   5. The inferior vena cava is normal in size with greater than 50%  respiratory variability, suggesting right atrial pressure of 3 mmHg.      Neuro/Psych  Headaches  negative psych ROS   GI/Hepatic Neg liver ROS,GERD  Medicated and Controlled,,  Endo/Other  diabetes, Well Controlled, Type 2, Oral Hypoglycemic AgentsHypothyroidism  Obesity BMI 39  Renal/GU Renal InsufficiencyRenal diseaseCr 1.08  negative genitourinary    Musculoskeletal  (+) Arthritis , Osteoarthritis,    Abdominal  (+) + obese  Peds negative pediatric ROS (+)  Hematology negative hematology ROS (+) Hb 14.6   Anesthesia Other Findings   Reproductive/Obstetrics negative OB ROS                             Anesthesia Physical Anesthesia Plan  ASA: 3  Anesthesia Plan: General   Post-op Pain Management: Tylenol  PO (pre-op)*, Ketamine IV* and Dilaudid  IV   Induction: Intravenous  PONV Risk Score and Plan: Ondansetron , Dexamethasone  and Treatment may vary due to age or medical condition  Airway Management Planned: Oral ETT  Additional Equipment: None  Intra-op Plan:   Post-operative Plan: Extubation in OR  Informed Consent: I have reviewed the patients History and Physical, chart, labs and discussed the procedure including the risks, benefits and alternatives for the proposed anesthesia with the patient or authorized representative who has indicated his/her understanding and acceptance.     Dental advisory given  Plan Discussed with: CRNA  Anesthesia Plan Comments: (Last airway note: Ventilation: Mask ventilation without difficulty Laryngoscope Size: Miller and 3 Grade View: Grade I Tube type: Oral Tube size: 7.5 mm Number of attempts: 1 )       Anesthesia Quick Evaluation

## 2023-12-10 NOTE — Progress Notes (Signed)
 Patient's blood sugar was 186 at 0757 this morning.  Patient did not want insulin  and blood sugar would drop without eating this morning.

## 2023-12-10 NOTE — Interval H&P Note (Signed)
 History and Physical Interval Note:  12/10/2023 9:05 AM  Tricia Dodson  has presented today for surgery, with the diagnosis of GALLSTONES.  The various methods of treatment have been discussed with the patient and family. After consideration of risks, benefits and other options for treatment, the patient has consented to  Procedure(s) with comments: CHOLECYSTECTOMY, ROBOT-ASSISTED, LAPAROSCOPIC (N/A) - ROBOTIC LAPAROSCOPIC CHOLECYSTECTOMY WITH ICG, POSSIBLE OPEN CHOLECYSTECTOMY POSSIBLE CHOLANGIOGRAM as a surgical intervention.  The patient's history has been reviewed, patient examined, no change in status, stable for surgery.  I have reviewed the patient's chart and labs.  Questions were answered to the patient's satisfaction.     [name]

## 2023-12-10 NOTE — Discharge Instructions (Addendum)
 CENTRAL  SURGERY DISCHARGE INSTRUCTIONS  Activity No heavy lifting greater than 15 pounds for 4 weeks after surgery. Ok to shower in 24 hours, but do not bathe or submerge incisions underwater. Do not drive while taking narcotic pain medication. You may drive when you are no longer taking prescription pain medication, you can comfortably wear a seatbelt, and you can safely maneuver your car and apply brakes.  Wound Care Your incisions are covered with skin glue called Dermabond. This will peel off on its own over time. You may shower and allow warm soapy water to run over your incisions. Gently pat dry. Do not submerge your incision underwater until cleared by your surgeon. Monitor your incision for any new redness, tenderness, or drainage. Many patients will experience some swelling and bruising at the incisions.  Ice packs will help.  Swelling and bruising can take several days to resolve.   Medications A  prescription for pain medication may be given to you upon discharge.  Take your pain medication as prescribed, if needed.  If narcotic pain medicine is not needed, then you may take acetaminophen  (Tylenol ) or ibuprofen (Advil) as needed. It is common to experience some constipation if taking pain medication after surgery.  Increasing fluid intake and taking a stool softener (such as Colace) will usually help or prevent this problem from occurring.  A mild laxative (Milk of Magnesia or Miralax ) should be taken according to package directions if there are no bowel movements after 48 hours. Take your usually prescribed medications unless otherwise directed. If you need a refill on your pain medication, please contact your pharmacy.  They will contact our office to request authorization. Prescriptions will not be filled after 5 pm or on weekends.  When to Call Us : Fever greater than 100.5 New redness, drainage, or swelling at incision site Severe pain, nausea, or  vomiting Persistent bleeding from incisions Jaundice (yellowing of the whites of the eyes or skin)  Follow-up You have an appointment scheduled with Dr. Leighton Punches on January 01, 2024 at 9:20am. This will be at the [facility] Surgery office at [address], [address], [place], [place]. Please arrive at least 15 minutes prior to your scheduled appointment time.  IF YOU HAVE DISABILITY OR FAMILY LEAVE FORMS, YOU MUST BRING THEM TO THE OFFICE FOR PROCESSING.   DO NOT GIVE THEM TO YOUR DOCTOR.  The clinic staff is available to answer your questions during regular business hours.  Please don't hesitate to call and ask to speak to one of the nurses for clinical concerns.  If you have a medical emergency, go to the nearest emergency room or call 911.  A surgeon from [facility] is always on call at the hospital  [address], [address], [place], [place]  [address] ?  [address] [address], [place], [place]   [address] [phone] ? Toll Free: [phone] ? FAX [phone] Web site: [email or web address]      Managing Your Pain After Surgery Without Opioids    Thank you for participating in our program to help patients manage their pain after surgery without opioids. This is part of our effort to provide you with the best care possible, without exposing you or your family to the risk that opioids pose.  What pain can I expect after surgery? You can expect to have some pain after surgery. This is normal. The pain is typically worse the day after surgery, and quickly begins to get better. Many studies have  found that many patients are able to manage their pain after surgery with Over-the-Counter (OTC) medications such as Tylenol  and Motrin. If you have a condition that does not allow you to take Tylenol  or Motrin, notify your surgical team.  How will I manage my pain? The best strategy for controlling your pain after surgery is around the clock pain control with  Tylenol  (acetaminophen ) and Motrin (ibuprofen or Advil). Alternating these medications with each other allows you to maximize your pain control. In addition to Tylenol  and Motrin, you can use heating pads or ice packs on your incisions to help reduce your pain.  How will I alternate your regular strength over-the-counter pain medication? You will take a dose of pain medication every three hours. Start by taking 650 mg of Tylenol  (2 pills of 325 mg) 3 hours later take 600 mg of Motrin (3 pills of 200 mg) 3 hours after taking the Motrin take 650 mg of Tylenol  3 hours after that take 600 mg of Motrin.   - 1 -  See example - if your first dose of Tylenol  is at 12:00 PM   12:00 PM Tylenol  650 mg (2 pills of 325 mg)  3:00 PM Motrin 600 mg (3 pills of 200 mg)  6:00 PM Tylenol  650 mg (2 pills of 325 mg)  9:00 PM Motrin 600 mg (3 pills of 200 mg)  Continue alternating every 3 hours   We recommend that you follow this schedule around-the-clock for at least 3 days after surgery, or until you feel that it is no longer needed. Use the table on the last page of this handout to keep track of the medications you are taking. Important: Do not take more than 3000mg  of Tylenol  or 3200mg  of Motrin in a 24-hour period. Do not take ibuprofen/Motrin if you have a history of bleeding stomach ulcers, severe kidney disease, &/or actively taking a blood thinner  What if I still have pain? If you have pain that is not controlled with the over-the-counter pain medications (Tylenol  and Motrin or Advil) you might have what we call "breakthrough" pain. You will receive a prescription for a small amount of an opioid pain medication such as Oxycodone , Tramadol, or Tylenol  with Codeine. Use these opioid pills in the first 24 hours after surgery if you have breakthrough pain. Do not take more than 1 pill every 4-6 hours.  If you still have uncontrolled pain after using all opioid pills, don't hesitate to call our staff  using the number provided. We will help make sure you are managing your pain in the best way possible, and if necessary, we can provide a prescription for additional pain medication.   Day 1    Time  Name of Medication Number of pills taken  Amount of Acetaminophen   Pain Level   Comments  AM PM       AM PM       AM PM       AM PM       AM PM       AM PM       AM PM       AM PM       Total Daily amount of Acetaminophen  Do not take more than  3,000 mg per day      Day 2    Time  Name of Medication Number of pills taken  Amount of Acetaminophen   Pain Level   Comments  AM PM  AM PM       AM PM       AM PM       AM PM       AM PM       AM PM       AM PM       Total Daily amount of Acetaminophen  Do not take more than  3,000 mg per day      Day 3    Time  Name of Medication Number of pills taken  Amount of Acetaminophen   Pain Level   Comments  AM PM       AM PM       AM PM       AM PM         AM PM       AM PM       AM PM       AM PM       Total Daily amount of Acetaminophen  Do not take more than  3,000 mg per day      Day 4    Time  Name of Medication Number of pills taken  Amount of Acetaminophen   Pain Level   Comments  AM PM       AM PM       AM PM       AM PM       AM PM       AM PM       AM PM       AM PM       Total Daily amount of Acetaminophen  Do not take more than  3,000 mg per day      Day 5    Time  Name of Medication Number of pills taken  Amount of Acetaminophen   Pain Level   Comments  AM PM       AM PM       AM PM       AM PM       AM PM       AM PM       AM PM       AM PM       Total Daily amount of Acetaminophen  Do not take more than  3,000 mg per day      Day 6    Time  Name of Medication Number of pills taken  Amount of Acetaminophen   Pain Level  Comments  AM PM       AM PM       AM PM       AM PM       AM PM       AM PM       AM PM       AM PM       Total Daily amount of  Acetaminophen  Do not take more than  3,000 mg per day      Day 7    Time  Name of Medication Number of pills taken  Amount of Acetaminophen   Pain Level   Comments  AM PM       AM PM       AM PM       AM PM       AM PM       AM PM       AM PM       AM PM  Total Daily amount of Acetaminophen  Do not take more than  3,000 mg per day        For additional information about how and where to safely dispose of unused opioid medications - [email or web address]  Disclaimer: This document contains information and/or instructional materials adapted from [facility] for the typical patient with your condition. It does not replace medical advice from your health care provider because your experience may differ from that of the typical patient. Talk to your health care provider if you have any questions about this document, your condition or your treatment plan. Adapted from [facility]

## 2023-12-10 NOTE — Op Note (Signed)
 Date: 12/10/23  Patient: Tricia Dodson MRN: [id number]  Preoperative Diagnosis: Symptomatic cholelithiasis of the remnant gallbladder Postoperative Diagnosis: Same  Procedure: Robotic-assisted laparoscopic completion cholecystectomy with fluorescence ICG cholangiography  Surgeon: Karleen Overall, MD Assistant: Jacquelynne Matters, RNFA  EBL: 50 mL  Anesthesia: General endotracheal  Specimens: Gallbladder  Indications: Tricia Dodson is a 73 yo female who underwent a partial cholecystectomy in January of this year for acute cholecystitis. She had a postoperative bile leak managed with a surgical drain and ERCP with biliary stent placement, after which the leak resolved. She has continued to have frequent upper abdominal discomfort, with nausea and bloating. Postoperative imaging has shown gallstones within the remnant gallbladder. After an extensive discussion of the risks and benefits of surgery, she agreed to proceed with a completion cholecystectomy.  Findings: Remnant gallbladder with extensive surrounding adhesions consistent with previous cholecystitis, with two moderate-sized stones within the remnant gallbladder. Biliary anatomy was confirmed with ICG.  Procedure details: Informed consent was obtained in the preoperative area prior to the procedure. The patient was brought to the operating room and placed on the table in the supine position. General anesthesia was induced and appropriate lines and drains were placed for intraoperative monitoring. Perioperative antibiotics were administered per SCIP guidelines. The abdomen was prepped and draped in the usual sterile fashion. A pre-procedure timeout was taken verifying patient identity, surgical site and procedure to be performed.  A small skin incision was made in the left mid-abdomen lateral to the umbilicus, a Veress needle was inserted through the fascia, and intraperitoneal placement was confirmed with the saline drop test. The abdomen  was insufflated, an 8mm Visiport was placed, and the abdomen was inspected with no evidence of visceral or vascular injury. A 12mm port was placed at the umbilicus, followed by two 8mm ports in the RUQ, all under direct visualization. The robot was then docked to the patient. There were omental adhesions over the right lobe of the liver obscuring the gallbladder. These were swept down off the abdominal wall using blunt and sharp dissection. The omentum was then swept down off the liver, lysing some of the adhesions with cautery where appropriate. The medial side of the gallbladder then became visible, along with the proximal duodenum. There were thin filmy adhesions between the duodenum and the medial gallbladder, which were sharply lysed. On the lateral side of the gallbladder, there were more dense fatty adhesions. These were swept down using blunt dissection and cautery, taking care to stay away from the duodenum. This exposed the entire fundus of the gallbladder. The fundus was then grasped and retracted cephalad. A suction irrigator was used to further bluntly sweep down adhesions off the inferior gallbladder. ICG was then used to confirm the location of the common bile duct. The location of the cystic duct was not immediately apparent. The fatty tissue in the cystic triangle was further dissected away using gentle blunt dissection with the suction irrigator. At this point I felt a top-down approach would help delineate the anatomy. The fundus of the gallbladder was separated from the liver using cautery, and this dissection was carried inferiorly. The lumen of the gallbladder was opened and there were two stones within the gallbladder, as well as bile. The back wall of the gallbladder was grasped and dissected off the cystic plate using cautery. Once the gallbladder was dissected off the cystic plate, ICG was again used to evaluate the porta and cystic triangle. The location of the common bile duct was clear,  but the  cystic duct was still not visible within the fatty tissue of the cystic triangle. The cystic artery was identified and circumferentially dissected out, then clipped and divided. The tissue in the cystic triangle was slowly dissected out using blunt dissection, and the cystic duct then became visible. ICG was used frequently throughout this process to confirm the biliary anatomy. The critical view of safety was obtained, and two large hemolock clips were placed on the cystic duct very close to the gallbladder. The cystic duct was then sharply divided close to the gallbladder, leaving the two clips behind on the cystic duct stump. The gallbladder and stones were placed in an Ecosac. The surgical site was irrigated copiously. The cystic duct and cystic artery stumps were inspected with no evidence of bile leak or bleeding. There was a small amount of oozing from the gallbladder fossa which was controlled with cautery. The duodenum was closely inspected and had no signs of injury. The robot was undocked from the patient. The specimen was extracted via the 12mm port, and the fascia at this port site was closed with a 0 Vicryl figure-of-eight suture using a PMI. The remaining ports were removed and the abdomen was desufflated. The skin at each port site was closed with 4-0 monocryl subcuticular suture. Dermabond was applied.   The patient tolerated the procedure well with no apparent complications. All counts were correct x2 at the end of the procedure. The patient was extubated and taken to PACU in stable condition.  Karleen Overall, MD 12/10/23 12:17 PM

## 2023-12-10 NOTE — Anesthesia Postprocedure Evaluation (Signed)
 Anesthesia Post Note  Patient: Tricia Dodson  Procedure(s) Performed: CHOLECYSTECTOMY, ROBOT-ASSISTED, LAPAROSCOPIC (Abdomen)     Patient location during evaluation: PACU Anesthesia Type: General Level of consciousness: awake and alert, oriented and patient cooperative Pain management: pain level controlled Vital Signs Assessment: post-procedure vital signs reviewed and stable Respiratory status: spontaneous breathing, nonlabored ventilation and respiratory function stable Cardiovascular status: blood pressure returned to baseline and stable Postop Assessment: no apparent nausea or vomiting Anesthetic complications: no   No notable events documented.  Last Vitals:  Vitals:   12/10/23 1245 12/10/23 1300  BP: (!) 141/75 (!) 120/99  Pulse: 77 78  Resp: 10 13  Temp:    SpO2: 96% 97%    Last Pain:  Vitals:   12/10/23 1245  TempSrc:   PainSc: Asleep                 Jacquelyne Matte

## 2023-12-11 LAB — SURGICAL PATHOLOGY

## 2023-12-13 ENCOUNTER — Ambulatory Visit (HOSPITAL_COMMUNITY): Admit: 2023-12-13 | Admitting: General Surgery

## 2023-12-13 SURGERY — LAPAROSCOPIC CHOLECYSTECTOMY
Anesthesia: General

## 2023-12-16 ENCOUNTER — Telehealth: Payer: Self-pay | Admitting: Family

## 2023-12-16 NOTE — Telephone Encounter (Signed)
 Copied from CRM [id number]. Topic: Medicare AWV >> Dec 16, 2023  2:50 PM Juliana Ocean wrote: Reason for CRM: LVM 12/16/2023 to schedule AWV. Please schedule Virtual or Telehealth visits ONLY  Rosalee Collins; Care Guide Ambulatory Clinical Support [place] [facility] Direct Dial: [phone]

## 2024-01-27 ENCOUNTER — Other Ambulatory Visit: Payer: Self-pay | Admitting: Family

## 2024-01-31 ENCOUNTER — Other Ambulatory Visit: Payer: Self-pay | Admitting: Family

## 2024-02-01 ENCOUNTER — Other Ambulatory Visit: Payer: Self-pay | Admitting: Family

## 2024-02-07 ENCOUNTER — Ambulatory Visit: Admitting: Family

## 2024-02-11 ENCOUNTER — Telehealth: Payer: Self-pay

## 2024-02-11 NOTE — Telephone Encounter (Signed)
 Left voicemail to call office back to discuss her CT referral.

## 2024-02-14 ENCOUNTER — Ambulatory Visit: Admitting: Family

## 2024-02-14 ENCOUNTER — Encounter: Payer: Self-pay | Admitting: Family

## 2024-02-14 VITALS — BP 112/72 | HR 63 | Temp 97.8°F | Ht 67.0 in | Wt 248.0 lb

## 2024-02-14 DIAGNOSIS — R42 Dizziness and giddiness: Secondary | ICD-10-CM

## 2024-02-14 DIAGNOSIS — E119 Type 2 diabetes mellitus without complications: Secondary | ICD-10-CM | POA: Diagnosis not present

## 2024-02-14 DIAGNOSIS — I10 Essential (primary) hypertension: Secondary | ICD-10-CM

## 2024-02-14 DIAGNOSIS — E079 Disorder of thyroid, unspecified: Secondary | ICD-10-CM | POA: Diagnosis not present

## 2024-02-14 LAB — COMPREHENSIVE METABOLIC PANEL WITH GFR
ALT: 22 U/L (ref 0–35)
AST: 19 U/L (ref 0–37)
Albumin: 4.2 g/dL (ref 3.5–5.2)
Alkaline Phosphatase: 69 U/L (ref 39–117)
BUN: 15 mg/dL (ref 6–23)
CO2: 30 meq/L (ref 19–32)
Calcium: 9.6 mg/dL (ref 8.4–10.5)
Chloride: 103 meq/L (ref 96–112)
Creatinine, Ser: 1.18 mg/dL (ref 0.40–1.20)
GFR: 45.98 mL/min — ABNORMAL LOW (ref >60.00)
Glucose, Bld: 144 mg/dL — ABNORMAL HIGH (ref 70–99)
Potassium: 4.7 meq/L (ref 3.5–5.1)
Sodium: 143 meq/L (ref 135–145)
Total Bilirubin: 0.5 mg/dL (ref 0.2–1.2)
Total Protein: 6.7 g/dL (ref 6.0–8.3)

## 2024-02-14 LAB — CBC WITH DIFFERENTIAL/PLATELET
Basophils Absolute: 0.1 K/uL (ref 0.0–0.1)
Basophils Relative: 1 % (ref 0.0–3.0)
Eosinophils Absolute: 0.1 K/uL (ref 0.0–0.7)
Eosinophils Relative: 2.4 % (ref 0.0–5.0)
HCT: 43 % (ref 36.0–46.0)
Hemoglobin: 14.1 g/dL (ref 12.0–15.0)
Lymphocytes Relative: 28.6 % (ref 12.0–46.0)
Lymphs Abs: 1.7 K/uL (ref 0.7–4.0)
MCHC: 32.8 g/dL (ref 30.0–36.0)
MCV: 83.2 fl (ref 78.0–100.0)
Monocytes Absolute: 0.3 K/uL (ref 0.1–1.0)
Monocytes Relative: 5.2 % (ref 3.0–12.0)
Neutro Abs: 3.7 K/uL (ref 1.4–7.7)
Neutrophils Relative %: 62.8 % (ref 43.0–77.0)
Platelets: 297 K/uL (ref 150.0–400.0)
RBC: 5.17 Mil/uL — ABNORMAL HIGH (ref 3.87–5.11)
RDW: 14.2 % (ref 11.5–15.5)
WBC: 5.9 K/uL (ref 4.0–10.5)

## 2024-02-14 LAB — TSH: TSH: 2.01 u[IU]/mL (ref 0.35–5.50)

## 2024-02-14 LAB — HEMOGLOBIN A1C: Hgb A1c MFr Bld: 7.9 % — ABNORMAL HIGH (ref 4.6–6.5)

## 2024-02-14 MED ORDER — LOSARTAN POTASSIUM 25 MG PO TABS: 25.0000 mg | ORAL_TABLET | Freq: Every day | ORAL | 0 refills | Status: DC

## 2024-02-14 NOTE — Progress Notes (Signed)
 Tricia Dodson is a 73 y.o. female with the following history as recorded in EpicCare:  Patient Active Problem List   Diagnosis Date Noted   Biliary sludge 10/11/2023   Abdominal pain, chronic, epigastric 10/10/2023   S/P cholecystectomy 10/10/2023   History of biliary stent insertion 10/10/2023   Bile leak 10/10/2023   Gastritis and gastroduodenitis 07/23/2023   Bile leak, postoperative 07/19/2023   Acute gangrenous cholecystitis 07/17/2023   Cholecystitis, acute 07/17/2023   AKI (acute kidney injury) (HCC) 07/17/2023   Primary osteoarthritis of both knees 04/18/2021   Dizziness 12/02/2020   Nonspecific abnormal electrocardiogram (ECG) (EKG) 12/02/2020   UTI (urinary tract infection)    Thyroid  disease    Hypertension    Hyperlipidemia    HA (headache)    GERD (gastroesophageal reflux disease)    Colon polyps    Chicken pox    Arthritis    Allergy    Morbid obesity (HCC) 11/30/2020   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 90 tablet 3   atorvastatin  (LIPITOR) 10 MG tablet TAKE 1 TABLET(10 MG) BY MOUTH DAILY (Patient taking differently: Take 10 mg by mouth at bedtime.) 90 tablet 3   DULoxetine  (CYMBALTA ) 20 MG capsule TAKE 1 CAPSULE(20 MG) BY MOUTH DAILY 90 capsule 1   empagliflozin  (JARDIANCE ) 10 MG TABS tablet Take 1 tablet (10 mg total) by mouth daily. 90 tablet 0   fenofibrate  micronized (LOFIBRA) 134 MG capsule TAKE 1 CAPSULE BY MOUTH DAILY BEFORE BREAKFAST (Patient taking differently: Take 134 mg by mouth daily.) 90 capsule 3   gabapentin   (NEURONTIN ) 300 MG capsule Take 1 capsule (300 mg total) by mouth 2 (two) times daily. 180 capsule 0   ibuprofen (ADVIL) 200 MG tablet Take 400 mg by mouth 2 (two) times daily as needed for moderate pain (pain score 4-6) or headache.     ipratropium (ATROVENT ) 0.03 % nasal spray Place 2 sprays into both nostrils every 12 (twelve) hours. (Patient taking differently: Place 2 sprays into both nostrils daily as needed for rhinitis.) 30 mL 0   levocetirizine (XYZAL ) 5 MG tablet TAKE 1 TABLET(5 MG) BY MOUTH DAILY (Patient taking differently: Take 5 mg by mouth at bedtime.) 90 tablet 3   levothyroxine  (SYNTHROID ) 25 MCG tablet TAKE 1 TABLET(25 MCG) BY MOUTH DAILY (Patient taking differently: Take 25 mcg by mouth See admin instructions. Take 1 tablet (25mcg) by mouth once daily at night, separate from food and other medications.) 90 tablet 3   omeprazole  (PRILOSEC) 40 MG capsule TAKE 1 CAPSULE(40 MG) BY MOUTH TWICE DAILY (Patient taking differently: Take 40 mg by mouth See admin instructions. Take 1 capsule (40mg ) by mouth every morning, may take an additional capsule in the evening if needed for heartburn, acid reflux.) 180 capsule 3   losartan  (COZAAR ) 25 MG tablet Take 1 tablet (25 mg total) by mouth daily. 90 tablet 0   No current facility-administered medications for this visit.    Allergies: Patient has no known allergies.  Past Medical History:  Diagnosis Date   Allergy    Arthritis  Carpal tunnel syndrome, bilateral 11/04/2020   Both wrists.    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   CPAP use counseling 11/04/2020   Diabetes mellitus without complication (HCC)    Type II   Family history of adverse reaction to anesthesia    Sister does not do well. usually has to stay overnight.   GERD (gastroesophageal reflux disease)    History of vein stripping 11/04/2020   Right leg   Hyperlipidemia    Hypertension    Hypothyroidism     Morbid obesity (HCC) 11/30/2020   OSA on CPAP 11/30/2020   Plantar fasciitis, bilateral 11/04/2020   With Bone spors   Retrognathia 11/30/2020   Shingles 2019   Thyroid  disease    Torn ligament 11/04/2020   Right knee   UTI (urinary tract infection)     Past Surgical History:  Procedure Laterality Date   BILATERAL CARPAL TUNNEL RELEASE     BILIARY STENT PLACEMENT  07/23/2023   Procedure: BILIARY STENT PLACEMENT;  Surgeon: Wilhelmenia Aloha Raddle., MD;  Location: [facility] ENDOSCOPY;  Service: Gastroenterology;;   BIOPSY OF SKIN SUBCUTANEOUS TISSUE AND/OR MUCOUS MEMBRANE  10/10/2023   Procedure: BIOPSY, SKIN, SUBCUTANEOUS TISSUE, OR MUCOUS MEMBRANE;  Surgeon: Wilhelmenia Aloha Raddle., MD;  Location: [facility];  Service: Gastroenterology;;   CHOLECYSTECTOMY N/A 07/18/2023   Procedure: LAPAROSCOPIC SUBTOTAL CHOLECYSTECTOMY WITH ICG DYE;  Surgeon: Polly Cordella LABOR, MD;  Location: [facility] OR;  Service: General;  Laterality: N/A;   CYST EXCISION Right    Wrist   DILATION AND CURETTAGE OF UTERUS     in her 1s   ENDOSCOPIC RETROGRADE CHOLANGIOPANCREATOGRAPHY (ERCP) WITH PROPOFOL  N/A 07/23/2023   Procedure: ENDOSCOPIC RETROGRADE CHOLANGIOPANCREATOGRAPHY (ERCP) WITH PROPOFOL ;  Surgeon: Wilhelmenia Aloha Raddle., MD;  Location: [facility] ENDOSCOPY;  Service: Gastroenterology;  Laterality: N/A;   ENDOSCOPIC RETROGRADE CHOLANGIOPANCREATOGRAPHY (ERCP) WITH PROPOFOL  N/A 10/10/2023   Procedure: ENDOSCOPIC RETROGRADE CHOLANGIOPANCREATOGRAPHY (ERCP) WITH PROPOFOL ;  Surgeon: Wilhelmenia Aloha Raddle., MD;  Location: [facility] ENDOSCOPY;  Service: Gastroenterology;  Laterality: N/A;   HEMORROIDECTOMY     KNEE ARTHROSCOPY Right    PANCREATIC STENT PLACEMENT  07/23/2023   Procedure: PANCREATIC STENT PLACEMENT;  Surgeon: Wilhelmenia Aloha Raddle., MD;  Location: [facility] ENDOSCOPY;  Service: Gastroenterology;;   PLANTAR FASCIA SURGERY     REMOVAL OF STONES  07/23/2023   Procedure: REMOVAL OF SLUDGE;  Surgeon: Wilhelmenia Aloha Raddle., MD;   Location: [facility] ENDOSCOPY;  Service: Gastroenterology;;   ANNETT  07/23/2023   Procedure: ANNETT;  Surgeon: Mansouraty, Aloha Raddle., MD;  Location: [facility] ENDOSCOPY;  Service: Gastroenterology;;   CLEDA REMOVAL  10/10/2023   Procedure: STENT REMOVAL;  Surgeon: Wilhelmenia Aloha Raddle., MD;  Location: [facility];  Service: Gastroenterology;;   STONE EXTRACTION WITH BASKET  10/10/2023   Procedure: ERCP, WITH LITHROTRIPSY OR REMOVAL OF COMMON BILE DUCT CALCULUS;  Surgeon: Wilhelmenia Aloha Raddle., MD;  Location: [facility] ENDOSCOPY;  Service: Gastroenterology;;    Family History  Problem Relation Age of Onset   Arthritis Mother    Diabetes Father    Heart disease Father    Arthritis Sister    Diabetes Sister    Hyperlipidemia Sister    Hyperlipidemia Brother    Heart disease Brother        triple heart bypass   Sleep apnea Brother     Social History   Tobacco Use   Smoking status: Never   Smokeless tobacco: Never  Substance Use Topics   Alcohol use:  Yes    Comment: Socially    Subjective:   Presents with concerns for feeling lightheaded/ dizzy; my head feels off balance. Was referred to ENT in March- has not been able to complete that follow up; does already see neurology for follow up on sleep study;  Does occasionally feel that the room is spinning; no sinus pain or pressure; Denies any chest pain, shortness of breath, blurred vision or headache. Has noticed that blood pressure at home runs similarly to what is seen here today;     Objective:  Vitals:   02/14/24 0844  BP: 112/72  Pulse: 63  Temp: 97.8 F (36.6 C)  TempSrc: Oral  SpO2: 98%  Weight: 248 lb (112.5 kg)  Height: 5' 7 (1.702 m)    General: Well developed, well nourished, in no acute distress  Skin : Warm and dry.  Head: Normocephalic and atraumatic  Eyes: Sclera and conjunctiva clear; pupils round and reactive to light; extraocular movements intact  Ears: External normal; canals clear; tympanic  membranes normal  Oropharynx: Pink, supple. No suspicious lesions  Neck: Supple without thyromegaly, adenopathy; no carotid bruits noted  Lungs: Respirations unlabored; clear to auscultation bilaterally without wheeze, rales, rhonchi  CVS exam: normal rate and regular rhythm.  Neurologic: Alert and oriented; speech intact; face symmetrical; moves all extremities well; CNII-XII intact without focal deficit   Assessment:  1. Type 2 diabetes mellitus without complication, without long-term current use of insulin  (HCC)   2. Thyroid  disease   3. Dizziness   4. Primary hypertension     Plan:  Will check CMP, Hgba1c today; Check TSH due to patient's concern about persisting fatigue; & 4. Update EKG today- sinus rhythm noted/ no rhythm change noted compared to EKGs done earlier this year; discussed concern that blood pressure may be too low and could be causing the symptoms; will decrease Losartan  to 25 mg daily; plan to follow up in 2 weeks for re-check; may need to cardiology or consider head CT;   No follow-ups on file.  Orders Placed This Encounter  Procedures   CBC with Differential/Platelet   Comp Met (CMET)   TSH   Hemoglobin A1c   EKG 12-Lead    Requested Prescriptions   Signed Prescriptions Disp Refills   losartan  (COZAAR ) 25 MG tablet 90 tablet 0    Sig: Take 1 tablet (25 mg total) by mouth daily.

## 2024-02-17 ENCOUNTER — Ambulatory Visit: Payer: Self-pay | Admitting: Family

## 2024-02-18 ENCOUNTER — Telehealth: Payer: Self-pay

## 2024-02-18 NOTE — Progress Notes (Signed)
 Will review at upcoming visit

## 2024-02-18 NOTE — Telephone Encounter (Signed)
 Copied from CRM [id number]. Topic: Clinical - Lab/Test Results >> Feb 18, 2024  2:25 PM Franky GRADE wrote: Reason for CRM: Patient returning a call she received from [facility], advised patient of the message left by Leita Elbe. Patient has not noticed any changes since lowering the blood pressure but states she only started taking the lower dose yesterday.

## 2024-02-28 ENCOUNTER — Ambulatory Visit: Admitting: Family

## 2024-02-28 ENCOUNTER — Ambulatory Visit: Payer: Self-pay | Admitting: Family

## 2024-02-28 ENCOUNTER — Encounter: Payer: Self-pay | Admitting: Family

## 2024-02-28 ENCOUNTER — Other Ambulatory Visit: Payer: Self-pay | Admitting: Family

## 2024-02-28 VITALS — BP 122/70 | HR 69 | Ht 67.0 in | Wt 250.6 lb

## 2024-02-28 DIAGNOSIS — I10 Essential (primary) hypertension: Secondary | ICD-10-CM

## 2024-02-28 DIAGNOSIS — Z7984 Long term (current) use of oral hypoglycemic drugs: Secondary | ICD-10-CM | POA: Diagnosis not present

## 2024-02-28 DIAGNOSIS — E119 Type 2 diabetes mellitus without complications: Secondary | ICD-10-CM | POA: Diagnosis not present

## 2024-02-28 LAB — BASIC METABOLIC PANEL WITH GFR
BUN: 20 mg/dL (ref 6–23)
CO2: 30 meq/L (ref 19–32)
Calcium: 9.1 mg/dL (ref 8.4–10.5)
Chloride: 102 meq/L (ref 96–112)
Creatinine, Ser: 1.12 mg/dL (ref 0.40–1.20)
GFR: 48.94 mL/min — ABNORMAL LOW (ref >60.00)
Glucose, Bld: 168 mg/dL — ABNORMAL HIGH (ref 70–99)
Potassium: 4.6 meq/L (ref 3.5–5.1)
Sodium: 141 meq/L (ref 135–145)

## 2024-02-28 MED ORDER — EMPAGLIFLOZIN 25 MG PO TABS: 25.0000 mg | ORAL_TABLET | Freq: Every day | ORAL | 3 refills | Status: DC

## 2024-02-28 NOTE — Progress Notes (Signed)
 Tricia Dodson is a 73 y.o. female with the following history as recorded in EpicCare:  Patient Active Problem List   Diagnosis Date Noted   Biliary sludge 10/11/2023   Abdominal pain, chronic, epigastric 10/10/2023   S/P cholecystectomy 10/10/2023   History of biliary stent insertion 10/10/2023   Bile leak 10/10/2023   Gastritis and gastroduodenitis 07/23/2023   Bile leak, postoperative 07/19/2023   Acute gangrenous cholecystitis 07/17/2023   Cholecystitis, acute 07/17/2023   AKI (acute kidney injury) (HCC) 07/17/2023   Primary osteoarthritis of both knees 04/18/2021   Dizziness 12/02/2020   Nonspecific abnormal electrocardiogram (ECG) (EKG) 12/02/2020   UTI (urinary tract infection)    Thyroid  disease    Hypertension    Hyperlipidemia    HA (headache)    GERD (gastroesophageal reflux disease)    Colon polyps    Chicken pox    Arthritis    Allergy    Morbid obesity (HCC) 11/30/2020   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 90 tablet 3   atorvastatin  (LIPITOR) 10 MG tablet TAKE 1 TABLET(10 MG) BY MOUTH DAILY (Patient taking differently: Take 10 mg by mouth at bedtime.) 90 tablet 3   DULoxetine  (CYMBALTA ) 20 MG capsule TAKE 1 CAPSULE(20 MG) BY MOUTH DAILY 90 capsule 1   fenofibrate  micronized (LOFIBRA) 134 MG capsule TAKE 1 CAPSULE BY MOUTH DAILY BEFORE BREAKFAST (Patient taking differently: Take 134 mg by mouth daily.) 90 capsule 3   gabapentin  (NEURONTIN ) 300 MG capsule Take 1 capsule (300 mg total) by mouth 2 (two) times daily. 180 capsule 0    ibuprofen (ADVIL) 200 MG tablet Take 400 mg by mouth 2 (two) times daily as needed for moderate pain (pain score 4-6) or headache.     ipratropium (ATROVENT ) 0.03 % nasal spray Place 2 sprays into both nostrils every 12 (twelve) hours. (Patient taking differently: Place 2 sprays into both nostrils daily as needed for rhinitis.) 30 mL 0   levocetirizine (XYZAL ) 5 MG tablet TAKE 1 TABLET(5 MG) BY MOUTH DAILY (Patient taking differently: Take 5 mg by mouth at bedtime.) 90 tablet 3   levothyroxine  (SYNTHROID ) 25 MCG tablet TAKE 1 TABLET(25 MCG) BY MOUTH DAILY (Patient taking differently: Take 25 mcg by mouth See admin instructions. Take 1 tablet (25mcg) by mouth once daily at night, separate from food and other medications.) 90 tablet 3   losartan  (COZAAR ) 25 MG tablet Take 1 tablet (25 mg total) by mouth daily. 90 tablet 0   omeprazole  (PRILOSEC) 40 MG capsule TAKE 1 CAPSULE(40 MG) BY MOUTH TWICE DAILY (Patient taking differently: Take 40 mg by mouth See admin instructions. Take 1 capsule (40mg ) by mouth every morning, may take an additional capsule in the evening if needed for heartburn, acid reflux.) 180 capsule 3   empagliflozin  (JARDIANCE ) 25 MG TABS tablet Take 1 tablet (25 mg total) by mouth daily before breakfast. 30 tablet 3   No current facility-administered medications for this visit.    Allergies: Metformin  and related  Past Medical History:  Diagnosis Date   Allergy    Arthritis  Carpal tunnel syndrome, bilateral 11/04/2020   Both wrists.    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   CPAP use counseling 11/04/2020   Diabetes mellitus without complication (HCC)    Type II   Family history of adverse reaction to anesthesia    Sister does not do well. usually has to stay overnight.   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/30/2020   Shingles 2019   Thyroid  disease    Torn ligament 11/04/2020   Right knee   UTI (urinary tract infection)     Past Surgical History:  Procedure Laterality Date   BILATERAL CARPAL TUNNEL RELEASE     BILIARY STENT PLACEMENT  07/23/2023   Procedure: BILIARY STENT PLACEMENT;  Surgeon: Wilhelmenia Aloha Raddle., MD;  Location: [facility] ENDOSCOPY;  Service: Gastroenterology;;   BIOPSY OF SKIN SUBCUTANEOUS TISSUE AND/OR MUCOUS MEMBRANE  10/10/2023   Procedure: BIOPSY, SKIN, SUBCUTANEOUS TISSUE, OR MUCOUS MEMBRANE;  Surgeon: Wilhelmenia Aloha Raddle., MD;  Location: [facility];  Service: Gastroenterology;;   CHOLECYSTECTOMY N/A 07/18/2023   Procedure: LAPAROSCOPIC SUBTOTAL CHOLECYSTECTOMY WITH ICG DYE;  Surgeon: Polly Cordella LABOR, MD;  Location: [facility] OR;  Service: General;  Laterality: N/A;   CYST EXCISION Right    Wrist   DILATION AND CURETTAGE OF UTERUS     in her 25s   ENDOSCOPIC RETROGRADE CHOLANGIOPANCREATOGRAPHY (ERCP) WITH PROPOFOL  N/A 07/23/2023   Procedure: ENDOSCOPIC RETROGRADE CHOLANGIOPANCREATOGRAPHY (ERCP) WITH PROPOFOL ;  Surgeon: Wilhelmenia Aloha Raddle., MD;  Location: [facility] ENDOSCOPY;  Service: Gastroenterology;  Laterality: N/A;   ENDOSCOPIC RETROGRADE CHOLANGIOPANCREATOGRAPHY (ERCP) WITH PROPOFOL  N/A 10/10/2023   Procedure: ENDOSCOPIC RETROGRADE CHOLANGIOPANCREATOGRAPHY (ERCP) WITH PROPOFOL ;  Surgeon: Wilhelmenia Aloha Raddle., MD;  Location: [facility] ENDOSCOPY;  Service: Gastroenterology;  Laterality: N/A;   HEMORROIDECTOMY     KNEE ARTHROSCOPY Right    PANCREATIC STENT PLACEMENT  07/23/2023   Procedure: PANCREATIC STENT PLACEMENT;  Surgeon: Wilhelmenia Aloha Raddle., MD;  Location: [facility] ENDOSCOPY;  Service: Gastroenterology;;   PLANTAR FASCIA SURGERY     REMOVAL OF STONES  07/23/2023   Procedure: REMOVAL OF SLUDGE;  Surgeon:  Wilhelmenia Aloha Raddle., MD;  Location: [facility] ENDOSCOPY;  Service: Gastroenterology;;   ANNETT  07/23/2023   Procedure: ANNETT;  Surgeon: Mansouraty, Aloha Raddle., MD;  Location: [facility] ENDOSCOPY;  Service: Gastroenterology;;   CLEDA REMOVAL  10/10/2023   Procedure: STENT REMOVAL;  Surgeon: Wilhelmenia Aloha Raddle., MD;  Location: [facility];  Service: Gastroenterology;;   STONE EXTRACTION WITH BASKET  10/10/2023   Procedure: ERCP, WITH LITHROTRIPSY OR REMOVAL OF COMMON BILE DUCT CALCULUS;  Surgeon: Wilhelmenia Aloha Raddle., MD;  Location: [facility] ENDOSCOPY;  Service: Gastroenterology;;    Family History  Problem Relation Age of Onset   Arthritis Mother    Diabetes Father    Heart disease Father    Arthritis Sister    Diabetes Sister    Hyperlipidemia Sister    Hyperlipidemia Brother    Heart disease Brother        triple heart bypass   Sleep apnea Brother     Social History   Tobacco Use   Smoking status: Never   Smokeless tobacco: Never  Substance Use Topics   Alcohol use:  Yes    Comment: Socially    Subjective:   Follow up on feeling light-headedness- has been feeling better since lowering dosage of blood pressure medication;  Most recent Hgba1c elevated at 7.9; only taking Jardiance  10 mg daily;     Objective:  Vitals:   02/28/24 0830  BP: 122/70  Pulse: 69  SpO2: 96%  Weight: 250 lb 9.6 oz (113.7 kg)  Height: 5' 7 (1.702 m)    General: Well developed, well nourished, in no acute distress  Skin : Warm and dry.  Head: Normocephalic and atraumatic  Eyes: Sclera and conjunctiva clear; pupils round and reactive to light; extraocular movements intact  Ears: External normal; canals clear; tympanic membranes normal  Oropharynx: Pink, supple. No suspicious lesions  Neck: Supple without thyromegaly, adenopathy  Lungs: Respirations unlabored; clear to auscultation bilaterally without wheeze, rales, rhonchi  CVS exam: normal rate and regular rhythm.  Neurologic:  Alert and oriented; speech intact; face symmetrical; moves all extremities well; CNII-XII intact without focal deficit   Assessment:  1. Primary hypertension   2. Morbid obesity (HCC) Chronic  3. Type 2 diabetes mellitus without complication, without long-term current use of insulin  (HCC)     Plan:  Stable- continue Losartan  25 mg every day; Will reach out to her GI to get his input on trial of GLP-1; Increase Jardiance  to 25 mg every day;   No follow-ups on file.  Orders Placed This Encounter  Procedures   Basic Metabolic Panel (BMET)    Requested Prescriptions    No prescriptions requested or ordered in this encounter

## 2024-02-28 NOTE — Patient Instructions (Signed)
 Please consider doing a transfer of care with Tricia Dodson;   Let me see what Dr. Wilhelmenia and Dr. Dasie say about the GLP-1;

## 2024-03-03 ENCOUNTER — Encounter: Payer: Self-pay | Admitting: Family

## 2024-03-05 ENCOUNTER — Telehealth: Payer: Self-pay | Admitting: Family

## 2024-03-05 ENCOUNTER — Other Ambulatory Visit: Payer: Self-pay | Admitting: Family

## 2024-03-05 MED ORDER — TIRZEPATIDE-WEIGHT MANAGEMENT 2.5 MG/0.5ML ~~LOC~~ SOLN
2.5000 mg | SUBCUTANEOUS | 0 refills | Status: DC
Start: 2024-03-05 — End: 2024-04-09

## 2024-03-05 NOTE — Telephone Encounter (Signed)
-----   Message from [facility] sent at 02/28/2024 10:10 PM EDT ----- There could be theoretical risk in those who have had gallstone pancreatitis and getting pancreatitis again on GLP-1.   She has never had pancreatitis though. I however, think that her risk is low, but if she developed symptoms concerning and understands risks, I think benefits could outweigh the theoretical risk. Wish there was more to say or give probabilities, but I cannot. GM ----- Message ----- From: Jason Leita Repine, FNP Sent: 02/28/2024   2:12 PM EDT To: Aloha Wilhelmenia Raddle., MD  Dr. Wilhelmenia,  I wanted to get your professional opinion about using a GLP-1 with [name]. I am hesitant to try this class with her based on the severity of her gallbladder issues. However, I told her I would reach out to ask you.  Thank you for your time.  Leita Jason, FNP

## 2024-03-11 ENCOUNTER — Telehealth: Payer: Self-pay

## 2024-03-11 NOTE — Telephone Encounter (Signed)
 Copied from CRM #[id number]. Topic: Clinical - Medication Question >> Mar 11, 2024  4:42 PM Sophia H wrote: Reason for CRM: Patient is calling in regarding starting the new medication referenced in encounters from 08/26. Patient was wanting to know if that medication was ever called into the pharmacy here at [facility]. Please reach out and advise. # [phone]

## 2024-03-12 ENCOUNTER — Other Ambulatory Visit (HOSPITAL_COMMUNITY): Payer: Self-pay

## 2024-03-12 ENCOUNTER — Telehealth: Payer: Self-pay

## 2024-03-12 NOTE — Telephone Encounter (Signed)
 Pharmacy Patient Advocate Encounter   Received notification from Onbase that prior authorization for Zepbound  2.5MG /0.5ML pen-injectors  is required/requested.   Insurance verification completed.   The patient is insured through [employer] .   Per test claim: PA required; PA submitted to above mentioned insurance via Latent Key/confirmation #/EOC [facility] Status is pending

## 2024-03-12 NOTE — Telephone Encounter (Signed)
 Pharmacy Patient Advocate Encounter  Received notification from [facility] that Prior Authorization for Zepbound  2.5MG /0.5ML pen-injectors  has been APPROVED from 03/12/24 to 07/08/24. Ran test claim, Copay is $0. This test claim was processed through [facility]- copay amounts may vary at other pharmacies due to pharmacy/plan contracts, or as the patient moves through the different stages of their insurance plan.   PA #/Case ID/Reference #: [id number]

## 2024-03-16 NOTE — Telephone Encounter (Signed)
 LMOM- apologized for delay, asked for call back if she was unable to pick up her meds.

## 2024-03-23 ENCOUNTER — Encounter: Payer: Self-pay | Admitting: Family

## 2024-03-24 ENCOUNTER — Other Ambulatory Visit: Payer: Self-pay | Admitting: Family

## 2024-03-24 MED ORDER — EMPAGLIFLOZIN 10 MG PO TABS: 10.0000 mg | ORAL_TABLET | Freq: Every day | ORAL | Status: DC

## 2024-03-24 MED ORDER — LANCET DEVICE MISC: 1.0000 | Freq: Three times a day (TID) | 0 refills | Status: AC

## 2024-03-24 MED ORDER — BLOOD GLUCOSE MONITORING SUPPL DEVI: 1.0000 | Freq: Three times a day (TID) | 0 refills | Status: AC

## 2024-03-24 MED ORDER — BLOOD GLUCOSE TEST VI STRP
1.0000 | ORAL_STRIP | Freq: Three times a day (TID) | 0 refills | Status: AC
Start: 2024-03-24 — End: 2024-04-23

## 2024-03-24 MED ORDER — LANCETS MISC. MISC
1.0000 | Freq: Three times a day (TID) | 0 refills | Status: AC
Start: 2024-03-24 — End: 2024-04-23

## 2024-04-07 ENCOUNTER — Encounter: Payer: Self-pay | Admitting: Pharmacist

## 2024-04-07 NOTE — Progress Notes (Signed)
 Pharmacy Quality Measure Review  This patient is appearing on a report for being at risk of failing the adherence measure for cholesterol (statin) medications this calendar year.   Medication: atorvastatin   Last fill date: 11/28/2023 for 90 day supply  Reviewed recent refill history in Dr Annemarie database. Actual last refill date was 03/01/2024 for 90 day supply. Patient has no refills remaining. Next appointment with PCP is 07/2024 - she will establish with a new provider since Leita Elbe is no longer at our practice.   I also noticed that she does not have refills left on her Mounjaro  prescription - she is on starting dose of 2.5mg  weekly so might need to increase dose.  Left message on patient's VM to call me so we can make sure she has refill to last until her appointment with Eleanor Ponto.    Madelin Ray, PharmD Clinical Pharmacist [facility] [phone]   04/09/2024 Addendum Patient left message on VM of Clinical Pharmacist Practitioner stating she would need a prescription for Zepbound  but I need to confirm is she is ready to increase to 5mg  weekly. Tried to call her back but had to LM VM again. CB # [id number].   Madelin Ray, PharmD Clinical Pharmacist [facility]   04/09/2024 - patient called back - plan to chang eto Mounjaro  and increase dose to 5mg  weekly (she has type 2 DM diagnosis . Lat A1c was 7.9%).  Sent request to PCP.

## 2024-04-09 ENCOUNTER — Other Ambulatory Visit (HOSPITAL_BASED_OUTPATIENT_CLINIC_OR_DEPARTMENT_OTHER): Payer: Self-pay

## 2024-04-09 ENCOUNTER — Telehealth: Payer: Self-pay | Admitting: Pharmacist

## 2024-04-09 DIAGNOSIS — E1165 Type 2 diabetes mellitus with hyperglycemia: Secondary | ICD-10-CM | POA: Insufficient documentation

## 2024-04-09 MED ORDER — TIRZEPATIDE 5 MG/0.5ML ~~LOC~~ SOAJ
5.0000 mg | SUBCUTANEOUS | 0 refills | Status: DC
  Filled 2024-04-09: qty 2, 28d supply, fill #0

## 2024-04-09 NOTE — Telephone Encounter (Signed)
 Patient started Zepbound  2.5mg  weekly in September 2025. She has tolerated well. Had a little nausea after first dose but none since. She has notices that her appetite have decreased.  She dose not have refills to last until she sees new PCP 05/15/2024.  Noted her last A1c was 7.9% - she should be taking Mounjaro  instead of Zepbound  due to type 2 DM diagnosis.  Patient is agreeable to switch to Mounjaro . She has 1 more dose of Zepbound  2.5mg  left for next dose on 04/12/2024.  I would recommend increasing dose to 5mg  weekly.  She would like Rx sent to [facility] because lat time she has to wait 3 weeks to get it from her usual pharmacy.  Sending request to Eleanor Ponto.

## 2024-04-21 ENCOUNTER — Telehealth: Payer: Self-pay | Admitting: Adult Health

## 2024-04-21 NOTE — Telephone Encounter (Signed)
 Appt reminder

## 2024-04-23 ENCOUNTER — Encounter: Payer: Self-pay | Admitting: *Deleted

## 2024-04-27 ENCOUNTER — Ambulatory Visit: Payer: Medicare Other | Admitting: Adult Health

## 2024-05-07 ENCOUNTER — Other Ambulatory Visit: Payer: Self-pay | Admitting: Medical Genetics

## 2024-05-07 DIAGNOSIS — Z006 Encounter for examination for normal comparison and control in clinical research program: Secondary | ICD-10-CM

## 2024-05-10 ENCOUNTER — Other Ambulatory Visit: Payer: Self-pay | Admitting: Family

## 2024-05-10 DIAGNOSIS — E1165 Type 2 diabetes mellitus with hyperglycemia: Secondary | ICD-10-CM

## 2024-05-11 ENCOUNTER — Other Ambulatory Visit (HOSPITAL_BASED_OUTPATIENT_CLINIC_OR_DEPARTMENT_OTHER): Payer: Self-pay

## 2024-05-11 ENCOUNTER — Other Ambulatory Visit: Payer: Self-pay

## 2024-05-11 MED ORDER — MOUNJARO 5 MG/0.5ML ~~LOC~~ SOAJ
5.0000 mg | SUBCUTANEOUS | 0 refills | Status: DC
  Filled 2024-05-11: qty 2, 28d supply, fill #0

## 2024-05-12 ENCOUNTER — Other Ambulatory Visit: Payer: Self-pay

## 2024-05-12 MED ORDER — EMPAGLIFLOZIN 10 MG PO TABS: 10.0000 mg | ORAL_TABLET | Freq: Every day | ORAL | 1 refills | Status: AC

## 2024-05-15 ENCOUNTER — Ambulatory Visit (INDEPENDENT_AMBULATORY_CARE_PROVIDER_SITE_OTHER): Admitting: Family

## 2024-05-15 VITALS — BP 124/60 | HR 71 | Temp 98.6°F | Resp 16 | Ht 67.0 in | Wt 238.0 lb

## 2024-05-15 DIAGNOSIS — J309 Allergic rhinitis, unspecified: Secondary | ICD-10-CM | POA: Diagnosis not present

## 2024-05-15 DIAGNOSIS — E782 Mixed hyperlipidemia: Secondary | ICD-10-CM

## 2024-05-15 DIAGNOSIS — I10 Essential (primary) hypertension: Secondary | ICD-10-CM | POA: Diagnosis not present

## 2024-05-15 DIAGNOSIS — G4700 Insomnia, unspecified: Secondary | ICD-10-CM

## 2024-05-15 DIAGNOSIS — Z7984 Long term (current) use of oral hypoglycemic drugs: Secondary | ICD-10-CM

## 2024-05-15 DIAGNOSIS — Z7985 Long-term (current) use of injectable non-insulin antidiabetic drugs: Secondary | ICD-10-CM

## 2024-05-15 DIAGNOSIS — E1165 Type 2 diabetes mellitus with hyperglycemia: Secondary | ICD-10-CM | POA: Diagnosis not present

## 2024-05-15 DIAGNOSIS — K219 Gastro-esophageal reflux disease without esophagitis: Secondary | ICD-10-CM

## 2024-05-15 DIAGNOSIS — Z1231 Encounter for screening mammogram for malignant neoplasm of breast: Secondary | ICD-10-CM

## 2024-05-15 DIAGNOSIS — B0229 Other postherpetic nervous system involvement: Secondary | ICD-10-CM

## 2024-05-15 DIAGNOSIS — E039 Hypothyroidism, unspecified: Secondary | ICD-10-CM

## 2024-05-15 DIAGNOSIS — M25569 Pain in unspecified knee: Secondary | ICD-10-CM

## 2024-05-15 MED ORDER — OMEPRAZOLE 40 MG PO CPDR
40.0000 mg | DELAYED_RELEASE_CAPSULE | Freq: Every day | ORAL | 0 refills | Status: AC
Start: 2024-05-15 — End: ?

## 2024-05-15 NOTE — Patient Instructions (Signed)
 VISIT SUMMARY:  Today, you had a routine follow-up visit to review your diabetes, hypertension, hyperlipidemia, and other health concerns. We discussed your recent fall, medication management, and ordered several tests to monitor your health.  YOUR PLAN:  TYPE 2 DIABETES MELLITUS WITH HYPERGLYCEMIA: Your diabetes is managed with Mounjaro , and you have experienced weight loss and appetite suppression. Your initial side effects have resolved. -Continue taking Mounjaro  at your current dose. -We have ordered an A1c test to update your blood sugar levels.  ESSENTIAL HYPERTENSION: Your blood pressure is well-controlled with losartan , and you no longer experience dizziness. -Continue taking losartan  25 mg.  MIXED HYPERLIPIDEMIA: Your cholesterol levels were good at the last check in early December. You are taking atorvastatin  and fenofibrate . -Continue taking atorvastatin  and fenofibrate . -We have ordered a fasting lipid panel to check your cholesterol levels.  ALLERGIC RHINITIS: You use Xyzal  to manage your nasal passages, especially with CPAP use. -Continue taking Xyzal .  INSOMNIA: You are managing your sleep with amitriptyline  and report good sleep with CPAP use. -Continue taking amitriptyline  at bedtime.  HYPOTHYROIDISM: Your thyroid  function is normal with levothyroxine , but you report feeling fatigued. -Continue taking levothyroxine .  GASTROESOPHAGEAL REFLUX DISEASE: Your reflux is well-controlled with omeprazole , though you occasionally experience symptoms with late-night eating or tomato sauce. -Continue taking omeprazole  once daily. -Consider taking omeprazole  as needed if your symptoms allow.  POSTHERPETIC NEURALGIA: You have ongoing pins and needles sensations from shingles in December 2019, managed with gabapentin . -Continue taking gabapentin . -Discuss shingles vaccine with your pharmacist.  GENERAL HEALTH MAINTENANCE: You are due for several routine tests and screenings. -We  have ordered a urine test for protein. -We have ordered a mammogram. -We have scheduled a foot exam.

## 2024-05-15 NOTE — Progress Notes (Addendum)
 Subjective:     Patient ID: Tricia Dodson, female    DOB: 04/26/1951, 73 y.o.   MRN: [id number]  Chief Complaint  Patient presents with   Transitions Of Care    HPI  Discussed the use of AI scribe software for clinical note transcription with the patient, who gave verbal consent to proceed.  History of Present Illness  Tricia Dodson is a 73 year old female with diabetes, hypertension, and hyperlipidemia who presents for a routine follow-up visit.  She recently experienced a fall, resulting in a slip and twist of her left knee.  Her diabetes is managed with Mounjaro , and she has lost 16 pounds since the summer with a decreased appetite. Initial gastrointestinal symptoms have resolved. She is interested in her current blood sugar levels. Jardiance  was previously increased to 25 mg but caused issues, so she returned to a lower dose.  Hypertension is managed with losartan , reduced to 25 mg after low blood pressure and dizziness post-gallbladder surgery. She no longer experiences dizziness.  For hyperlipidemia, she takes atorvastatin  and fenofibrate . Her cholesterol was last checked in early December of the previous year and was reported as good.  She uses Xyzal  year-round for nasal passage clearance, especially with CPAP use at night. Amitriptyline  aids her sleep, and she reports sleeping well with the CPAP.  Gabapentin  is taken for postherpetic neuralgia from shingles in December 2019, with ongoing pins and needles sensations and tingling.  Omeprazole  is taken once daily for reflux, with good control of symptoms, though occasional reflux occurs with late-night eating or tomato sauce consumption.     Health Maintenance Due  Topic Date Due   Medicare Annual Wellness (AWV)  Never done   FOOT EXAM  Never done   Diabetic kidney evaluation - Urine ACR  Never done   Hepatitis C Screening  Never done   Influenza Vaccine  02/07/2024   Mammogram  05/07/2024    Past  Medical History:  Diagnosis Date   Allergy    Arthritis    Carpal tunnel syndrome, bilateral 11/04/2020   Both wrists.    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   CPAP use counseling 11/04/2020   Diabetes mellitus without complication (HCC)    Type II   Family history of adverse reaction to anesthesia    Sister does not do well. usually has to stay overnight.   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   Shingles 2019   Thyroid  disease    Torn ligament 11/04/2020   Right knee   UTI (urinary tract infection)     Past Surgical History:  Procedure Laterality Date   BILATERAL CARPAL TUNNEL RELEASE     BILIARY STENT PLACEMENT  07/23/2023   Procedure: BILIARY STENT PLACEMENT;  Surgeon: Wilhelmenia Aloha Raddle., MD;  Location: [facility] ENDOSCOPY;  Service: Gastroenterology;;   BIOPSY OF SKIN SUBCUTANEOUS TISSUE AND/OR MUCOUS MEMBRANE  10/10/2023   Procedure: BIOPSY, SKIN, SUBCUTANEOUS TISSUE, OR MUCOUS MEMBRANE;  Surgeon: Wilhelmenia Aloha Raddle., MD;  Location: [facility] ENDOSCOPY;  Service: Gastroenterology;;   CHOLECYSTECTOMY N/A 07/18/2023   Procedure: LAPAROSCOPIC SUBTOTAL CHOLECYSTECTOMY WITH ICG DYE;  Surgeon: Polly Cordella LABOR, MD;  Location: [facility] OR;  Service: General;  Laterality: N/A;   CYST EXCISION Right    Wrist   DILATION AND CURETTAGE OF UTERUS     in her 30s   ENDOSCOPIC RETROGRADE CHOLANGIOPANCREATOGRAPHY (ERCP) WITH PROPOFOL  N/A 07/23/2023   Procedure: ENDOSCOPIC RETROGRADE CHOLANGIOPANCREATOGRAPHY (ERCP) WITH PROPOFOL ;  Surgeon: Wilhelmenia Aloha Raddle., MD;  Location: [facility] ENDOSCOPY;  Service: Gastroenterology;  Laterality: N/A;   ENDOSCOPIC RETROGRADE  CHOLANGIOPANCREATOGRAPHY (ERCP) WITH PROPOFOL  N/A 10/10/2023   Procedure: ENDOSCOPIC RETROGRADE CHOLANGIOPANCREATOGRAPHY (ERCP) WITH PROPOFOL ;  Surgeon: Wilhelmenia Aloha Raddle., MD;  Location: [facility] ENDOSCOPY;  Service: Gastroenterology;  Laterality: N/A;   HEMORROIDECTOMY     KNEE ARTHROSCOPY Right    PANCREATIC STENT PLACEMENT  07/23/2023   Procedure: PANCREATIC STENT PLACEMENT;  Surgeon: Wilhelmenia, Aloha Raddle., MD;  Location: [facility] ENDOSCOPY;  Service: Gastroenterology;;   PLANTAR FASCIA SURGERY     REMOVAL OF STONES  07/23/2023   Procedure: REMOVAL OF SLUDGE;  Surgeon: Wilhelmenia Aloha Raddle., MD;  Location: [facility] ENDOSCOPY;  Service: Gastroenterology;;   ANNETT  07/23/2023   Procedure: ANNETT;  Surgeon: Mansouraty, Aloha Raddle., MD;  Location: [facility] ENDOSCOPY;  Service: Gastroenterology;;   CLEDA REMOVAL  10/10/2023   Procedure: STENT REMOVAL;  Surgeon: Wilhelmenia Aloha Raddle., MD;  Location: [facility]OSCOPY;  Service: Gastroenterology;;   STONE EXTRACTION WITH BASKET  10/10/2023   Procedure: ERCP, WITH LITHROTRIPSY OR REMOVAL OF COMMON BILE DUCT CALCULUS;  Surgeon: Wilhelmenia Aloha Raddle., MD;  Location: [facility] ENDOSCOPY;  Service: Gastroenterology;;    Family History  Problem Relation Age of Onset   Arthritis Mother    Diabetes Father    Heart disease Father    Arthritis Sister    Diabetes Sister    Hyperlipidemia Sister    Hyperlipidemia Brother    Heart disease Brother        triple heart bypass   Sleep apnea Brother     Social History   Socioeconomic History   Marital status: Single    Spouse name: Not on file   Number of children: Not on file   Years of education: Not on file   Highest education level: Bachelor's degree (e.g., BA, AB, BS)  Occupational History   Not on file  Tobacco Use   Smoking status: Never   Smokeless tobacco: Never  Vaping Use   Vaping status: Never Used  Substance and Sexual Activity   Alcohol use: Yes    Comment: Socially   Drug use:  Not Currently   Sexual activity: Not Currently  Other Topics Concern   Not on file  Social History Narrative   Not on file   Social Drivers of Health   Financial Resource Strain: Medium Risk (05/15/2024)   Overall Financial Resource Strain (CARDIA)    Difficulty of Paying Living Expenses: Somewhat hard  Food Insecurity: No Food Insecurity (05/15/2024)   Hunger Vital Sign    Worried About Running Out of Food in the Last Year: Never true    Ran Out of Food in the Last Year: Never true  Transportation Needs: No Transportation Needs (05/15/2024)   PRAPARE - Administrator, Civil Service (Medical): No    Lack of Transportation (Non-Medical): No  Physical Activity: Insufficiently Active (05/15/2024)   Exercise Vital Sign    Days of Exercise per Week: 3 days    Minutes of Exercise per Session: 20 min  Stress: No Stress Concern Present (05/15/2024)   [employer] of Occupational Health - Occupational Stress Questionnaire    Feeling of Stress:  Only a little  Social Connections: Moderately Isolated (05/15/2024)   Social Connection and Isolation Panel    Frequency of Communication with Friends and Family: More than three times a week    Frequency of Social Gatherings with Friends and Family: Three times a week    Attends Religious Services: More than 4 times per year    Active Member of Clubs or Organizations: No    Attends Banker Meetings: Not on file    Marital Status: Never married  Intimate Partner Violence: Not At Risk (07/17/2023)   Humiliation, Afraid, Rape, and Kick questionnaire    Fear of Current or Ex-Partner: No    Emotionally Abused: No    Physically Abused: No    Sexually Abused: No    Outpatient Medications Prior to Visit  Medication Sig Dispense Refill   amitriptyline  (ELAVIL ) 10 MG tablet Take 1 tablet (10 mg total) by mouth at bedtime. 90 tablet 3   atorvastatin  (LIPITOR) 10 MG tablet TAKE 1 TABLET(10 MG) BY MOUTH DAILY (Patient taking  differently: Take 10 mg by mouth at bedtime.) 90 tablet 3   Blood Glucose Monitoring Suppl DEVI 1 each by Does not apply route in the morning, at noon, and at bedtime. May substitute to any manufacturer covered by patient's insurance. 1 each 0   empagliflozin  (JARDIANCE ) 10 MG TABS tablet Take 1 tablet (10 mg total) by mouth daily before breakfast. 90 tablet 1   fenofibrate  micronized (LOFIBRA) 134 MG capsule TAKE 1 CAPSULE BY MOUTH DAILY BEFORE BREAKFAST (Patient taking differently: Take 134 mg by mouth daily.) 90 capsule 3   gabapentin  (NEURONTIN ) 300 MG capsule Take 1 capsule (300 mg total) by mouth 2 (two) times daily. 180 capsule 0   ibuprofen (ADVIL) 200 MG tablet Take 400 mg by mouth 2 (two) times daily as needed for moderate pain (pain score 4-6) or headache.     levocetirizine (XYZAL ) 5 MG tablet TAKE 1 TABLET(5 MG) BY MOUTH DAILY (Patient taking differently: Take 5 mg by mouth at bedtime.) 90 tablet 3   levothyroxine  (SYNTHROID ) 25 MCG tablet TAKE 1 TABLET(25 MCG) BY MOUTH DAILY (Patient taking differently: Take 25 mcg by mouth See admin instructions. Take 1 tablet (25mcg) by mouth once daily at night, separate from food and other medications.) 90 tablet 3   losartan  (COZAAR ) 25 MG tablet Take 1 tablet (25 mg total) by mouth daily. 90 tablet 0   tirzepatide  (MOUNJARO ) 5 MG/0.5ML Pen Inject 5 mg into the skin once a week. 2 mL 0   DULoxetine  (CYMBALTA ) 20 MG capsule TAKE 1 CAPSULE(20 MG) BY MOUTH DAILY 90 capsule 1   omeprazole  (PRILOSEC) 40 MG capsule TAKE 1 CAPSULE(40 MG) BY MOUTH TWICE DAILY (Patient taking differently: Take 40 mg by mouth See admin instructions. Take 1 capsule (40mg ) by mouth every morning, may take an additional capsule in the evening if needed for heartburn, acid reflux.) 180 capsule 3   ipratropium (ATROVENT ) 0.03 % nasal spray Place 2 sprays into both nostrils every 12 (twelve) hours. (Patient taking differently: Place 2 sprays into both nostrils daily as needed for  rhinitis.) 30 mL 0   No facility-administered medications prior to visit.    Allergies  Allergen Reactions   Metformin  And Related Other (See Comments)    Diarrhea/ nausea and vomiting    ROS See HPI    Objective:    Physical Exam Constitutional:      General: She is not in acute distress.    Appearance:  Normal appearance. She is well-developed.  HENT:     Head: Normocephalic and atraumatic.     Right Ear: External ear normal.     Left Ear: External ear normal.  Eyes:     General: No scleral icterus. Neck:     Thyroid : No thyromegaly.  Cardiovascular:     Rate and Rhythm: Normal rate and regular rhythm.     Heart sounds: Normal heart sounds. No murmur heard. Pulmonary:     Effort: Pulmonary effort is normal. No respiratory distress.     Breath sounds: Normal breath sounds. No wheezing.  Musculoskeletal:     Cervical back: Neck supple.  Skin:    General: Skin is warm and dry.  Neurological:     Mental Status: She is alert and oriented to person, place, and time.  Psychiatric:        Mood and Affect: Mood normal.        Behavior: Behavior normal.        Thought Content: Thought content normal.        Judgment: Judgment normal.    Diabetic Foot Exam - Simple   Simple Foot Form Diabetic Foot exam was performed with the following findings: Yes 05/15/2024  3:40 PM  Visual Inspection No deformities, no ulcerations, no other skin breakdown bilaterally: Yes Sensation Testing Intact to touch and monofilament testing bilaterally: Yes Pulse Check Posterior Tibialis and Dorsalis pulse intact bilaterally: Yes Comments       BP 124/60 (BP Location: Right Arm, Patient Position: Sitting, Cuff Size: Large)   Pulse 71   Temp 98.6 F (37 C) (Oral)   Resp 16   Ht 5' 7 (1.702 m)   Wt 238 lb (108 kg)   SpO2 98%   BMI 37.28 kg/m  Wt Readings from Last 3 Encounters:  05/15/24 238 lb (108 kg)  02/28/24 250 lb 9.6 oz (113.7 kg)  02/14/24 248 lb (112.5 kg)        Assessment & Plan:   Problem List Items Addressed This Visit       Unprioritized   Type 2 diabetes mellitus with hyperglycemia, without long-term current use of insulin  (HCC) - Primary   Lab Results  Component Value Date   HGBA1C 7.9 (H) 02/14/2024   HGBA1C 7.6 (H) 12/09/2023   HGBA1C 6.3 09/27/2023   Lab Results  Component Value Date   LDLCALC 55 06/18/2023   CREATININE 1.12 02/28/2024   She has lost weight on mounjaro .  Tolerating 5mg  along with jardiance .       Relevant Orders   HgB A1c   Urine Microalbumin w/creat. ratio   Comp Met (CMET)   Post herpetic neuralgia   Chronic RLE since 2019- continues to find relief with gabapentin . Continue same.      Knee pain   She has been on cymbalta  following a study on on knee pain- does not help. She will trial off and plans to call ortho re: recent left knee injury.       Insomnia   Stable on elavil . Continue same.       Hypothyroidism   Clinically stable on synthroid . TSH normal.   Lab Results  Component Value Date   TSH 2.01 02/14/2024         Hypertension   BP stable on losartan , continue same.       Hyperlipidemia   Lab Results  Component Value Date   CHOL 128 06/18/2023   HDL 40.20 06/18/2023   LDLCALC 55 06/18/2023  LDLDIRECT 84.0 01/30/2022   TRIG 162.0 (H) 06/18/2023   CHOLHDL 3 06/18/2023   Update lipid panel.        Relevant Orders   Lipid panel   GERD (gastroesophageal reflux disease)   Stable on omeprazole  once daily. Recommend trial of PRN.        Relevant Medications   omeprazole  (PRILOSEC) 40 MG capsule   Allergic rhinitis   Stable on xyzal , continue same.        Other Visit Diagnoses       Breast cancer screening by mammogram       Relevant Orders   MM 3D SCREENING MAMMOGRAM BILATERAL BREAST       I have discontinued Tricia Dodson's DULoxetine . I have also changed her omeprazole . Additionally, I am having her maintain her ipratropium, amitriptyline ,  fenofibrate  micronized, atorvastatin , levothyroxine , levocetirizine, ibuprofen, gabapentin , losartan , Blood Glucose Monitoring Suppl, Mounjaro , and empagliflozin .  Meds ordered this encounter  Medications   omeprazole  (PRILOSEC) 40 MG capsule    Sig: Take 1 capsule (40 mg total) by mouth daily.    Dispense:  90 capsule    Refill:  0    Supervising Provider:   DOMENICA BLACKBIRD A [[id number]]

## 2024-05-15 NOTE — Assessment & Plan Note (Signed)
 Lab Results  Component Value Date   CHOL 128 06/18/2023   HDL 40.20 06/18/2023   LDLCALC 55 06/18/2023   LDLDIRECT 84.0 01/30/2022   TRIG 162.0 (H) 06/18/2023   CHOLHDL 3 06/18/2023   Update lipid panel.

## 2024-05-15 NOTE — Assessment & Plan Note (Signed)
 Stable on xyzal , continue same.

## 2024-05-15 NOTE — Assessment & Plan Note (Addendum)
 Lab Results  Component Value Date   HGBA1C 7.9 (H) 02/14/2024   HGBA1C 7.6 (H) 12/09/2023   HGBA1C 6.3 09/27/2023   Lab Results  Component Value Date   LDLCALC 55 06/18/2023   CREATININE 1.12 02/28/2024   She has lost weight on mounjaro .  Tolerating 5mg  along with jardiance .

## 2024-05-15 NOTE — Assessment & Plan Note (Signed)
BP stable on losartan, continue same.  

## 2024-05-15 NOTE — Assessment & Plan Note (Signed)
Stable on elavil. Continue same.  

## 2024-05-15 NOTE — Assessment & Plan Note (Signed)
 Clinically stable on synthroid . TSH normal.   Lab Results  Component Value Date   TSH 2.01 02/14/2024

## 2024-05-15 NOTE — Assessment & Plan Note (Signed)
 She has been on cymbalta  following a study on on knee pain- does not help. She will trial off and plans to call ortho re: recent left knee injury.

## 2024-05-15 NOTE — Assessment & Plan Note (Signed)
 Chronic RLE since 2019- continues to find relief with gabapentin . Continue same.

## 2024-05-15 NOTE — Assessment & Plan Note (Signed)
 Stable on omeprazole  once daily. Recommend trial of PRN.

## 2024-05-19 ENCOUNTER — Other Ambulatory Visit

## 2024-05-19 DIAGNOSIS — E1165 Type 2 diabetes mellitus with hyperglycemia: Secondary | ICD-10-CM | POA: Diagnosis not present

## 2024-05-19 DIAGNOSIS — E782 Mixed hyperlipidemia: Secondary | ICD-10-CM | POA: Diagnosis not present

## 2024-05-19 DIAGNOSIS — Z1231 Encounter for screening mammogram for malignant neoplasm of breast: Secondary | ICD-10-CM

## 2024-05-19 LAB — COMPREHENSIVE METABOLIC PANEL WITH GFR
ALT: 30 U/L (ref 0–35)
AST: 26 U/L (ref 0–37)
Albumin: 4.4 g/dL (ref 3.5–5.2)
Alkaline Phosphatase: 64 U/L (ref 39–117)
BUN: 16 mg/dL (ref 6–23)
CO2: 29 meq/L (ref 19–32)
Calcium: 9.6 mg/dL (ref 8.4–10.5)
Chloride: 103 meq/L (ref 96–112)
Creatinine, Ser: 1.1 mg/dL (ref 0.40–1.20)
GFR: 49.93 mL/min — ABNORMAL LOW (ref >60.00)
Glucose, Bld: 107 mg/dL — ABNORMAL HIGH (ref 70–99)
Potassium: 4.2 meq/L (ref 3.5–5.1)
Sodium: 141 meq/L (ref 135–145)
Total Bilirubin: 0.8 mg/dL (ref 0.2–1.2)
Total Protein: 6.9 g/dL (ref 6.0–8.3)

## 2024-05-19 LAB — LIPID PANEL
Cholesterol: 126 mg/dL (ref 0–200)
HDL: 43 mg/dL (ref >39.00)
LDL Cholesterol: 55 mg/dL (ref 0–99)
NonHDL: 82.51
Total CHOL/HDL Ratio: 3
Triglycerides: 137 mg/dL (ref 0.0–149.0)
VLDL: 27.4 mg/dL (ref 0.0–40.0)

## 2024-05-19 LAB — HEMOGLOBIN A1C: Hgb A1c MFr Bld: 6.7 % — ABNORMAL HIGH (ref 4.6–6.5)

## 2024-05-19 LAB — MICROALBUMIN / CREATININE URINE RATIO
Creatinine,U: 78.3 mg/dL
Microalb Creat Ratio: 26 mg/g (ref 0.0–30.0)
Microalb, Ur: 2 mg/dL — ABNORMAL HIGH (ref 0.0–1.9)

## 2024-05-19 NOTE — Progress Notes (Signed)
 Tricia Dodson                                          MRN: [id number]   05/19/2024   The [facility] Quality Team Specialist reviewed this patient medical record for the purposes of chart review for care gap closure. The following were reviewed: chart review for care gap closure-kidney health evaluation for diabetes:eGFR  and uACR.    [facility] Quality Team

## 2024-05-20 ENCOUNTER — Ambulatory Visit: Payer: Self-pay | Admitting: Family

## 2024-05-24 ENCOUNTER — Other Ambulatory Visit: Payer: Self-pay | Admitting: Pharmacist

## 2024-05-24 ENCOUNTER — Encounter: Payer: Self-pay | Admitting: Pharmacist

## 2024-05-24 MED ORDER — ATORVASTATIN CALCIUM 10 MG PO TABS: 10.0000 mg | ORAL_TABLET | Freq: Every day | ORAL | 0 refills | Status: AC

## 2024-05-24 MED ORDER — LOSARTAN POTASSIUM 25 MG PO TABS: 25.0000 mg | ORAL_TABLET | Freq: Every day | ORAL | 0 refills | Status: AC

## 2024-05-24 NOTE — Progress Notes (Signed)
 Pharmacy Quality Measure Review  This patient is appearing on a report for being at risk of failing the adherence measure for cholesterol (statin) and diabetes medications this calendar year.   Medication: atorvastatin   Last fill date: 03/01/2024 for 90 day supply  Medication: Mounjaro  5mg  Last fill date: 05/11/2024 for 28 days  Medication: losartan  25mg  Last fill date: 02/29/2024 for 90 days  Updated refill for atorvastatin  and losartan  for 90 days - due to see PCP again 08/2024.     Madelin Ray, PharmD Clinical Pharmacist [facility]  Population Health [phone]

## 2024-06-07 ENCOUNTER — Other Ambulatory Visit: Payer: Self-pay | Admitting: Family

## 2024-06-07 DIAGNOSIS — E1165 Type 2 diabetes mellitus with hyperglycemia: Secondary | ICD-10-CM

## 2024-06-08 ENCOUNTER — Other Ambulatory Visit (HOSPITAL_BASED_OUTPATIENT_CLINIC_OR_DEPARTMENT_OTHER): Payer: Self-pay

## 2024-06-08 ENCOUNTER — Other Ambulatory Visit: Payer: Self-pay

## 2024-06-08 MED ORDER — MOUNJARO 5 MG/0.5ML ~~LOC~~ SOAJ
5.0000 mg | SUBCUTANEOUS | 2 refills | Status: DC
  Filled 2024-06-08: qty 2, 28d supply, fill #0
  Filled 2024-07-04: qty 2, 28d supply, fill #1

## 2024-06-09 ENCOUNTER — Ambulatory Visit: Admitting: Adult Health

## 2024-06-09 ENCOUNTER — Encounter: Payer: Self-pay | Admitting: Adult Health

## 2024-06-09 VITALS — BP 134/87 | HR 77 | Ht 67.0 in | Wt 230.0 lb

## 2024-06-09 DIAGNOSIS — G4733 Obstructive sleep apnea (adult) (pediatric): Secondary | ICD-10-CM

## 2024-06-09 NOTE — Patient Instructions (Signed)
Your Plan: ? ?Continue ? ? ? ? ?Thank you for coming to see us at [facility]. I hope we have been able to provide you high quality care today. ? ?You may receive a patient satisfaction survey over the next few weeks. We would appreciate your feedback and comments so that we may continue to improve ourselves and the health of our patients. ? ?

## 2024-06-09 NOTE — Progress Notes (Signed)
 SABRA

## 2024-06-09 NOTE — Progress Notes (Signed)
 Community message has been sent to [employer] for pressure and supplies on 06/09/24. DD

## 2024-06-09 NOTE — Progress Notes (Signed)
 PATIENT: Tricia Dodson DOB: October 08, 1950  REASON FOR VISIT: follow up HISTORY FROM: patient PRIMARY NEUROLOGIST: Dr. Chalice  Chief Complaint  Patient presents with   Follow-up    Pt in 18 Pt here for cpap f/u Pt states stuffy in am Pt states pressure to low      HISTORY OF PRESENT ILLNESS: Today 06/09/24:  Tricia Dodson is a 73 y.o. female with a history of obstructive sleep apnea on CPAP. Returns today for follow-up.  She reports that CPAP is working well although she does not like the [employer].  She prefers [employer].  She also states that they changed out her mask because her previous 1 was discontinued.  She states that the current mask is not comfortable.  She also feels that the pressure is not high enough.  She is currently on AutoSet.  She does have a travel machine that she uses but not sure of the set pressure.  Her download is below     04/15/23: Tricia Dodson is a 73 y.o. female with a history of OSA on CPAP. Returns today for follow-up.  Her download shows that she has her machine 23 out of 30 days for compliance of 77%.  She has her machine greater than 4 hours each night.  Her residual AHI is 1.8 on 5 to 17 cm of water.  Her leak in the 95th percentile is 15 L/min. Gap in usage due to vacation she used her travel machine.  She reports that CPAP is working well for her.  Denies any new issues.  Returns today for an evaluation.   04/12/22: Tricia Dodson is a 73 year old female with a history of OSA on CPAP.  She brought in her card today but unfortunately cannot get a download.  She states CPAP is Better with pressure changes. Uses machine nightly. When she travels she takes a smaller machine and that would explain gaps in usage. Lately a little more sleepy but doesn't think its related to CPAP but insomnia. Retired but will be starting a job with [employer].   04/11/21: Tricia Dodson is a 73 year old female with a history of obstructive sleep  apnea on CPAP.  Her download indicates that she use her machine 29 out of 30 days for compliance of 97%.  She use her machine greater than 4 hours each night.  On average she uses her machine 8 hours and 7 minutes.  Her residual AHI is 1.1 on 5-17 cmH2O. she does not have a significant leak.  She states that when she puts her mask on she feels that it does not give her complete air before it stops.  She states that this lasted for about 10 to 15 minutes and then the machine works fine.  HISTORY (Copied from Dr.Dohmeier's note)  Tricia Dodson is a 59 - year- old Caucasian female patient seen here upon FNP referral on 11/30/2020 from NP Jason, her primary care provider.   Chief concern according to patient : see above     Tricia Dodson  has a past medical history of Allergy, Arthritis, Chicken pox, Colon polyps, GERD (gastroesophageal reflux disease), HA (headache), Hyperlipidemia, Hypertension, Thyroid  disease, and UTI (urinary tract infection)..   The patient had the first sleep study in the year 2005  with a result of OSA being found. She is on her third CPAP, 73 years old.  She had surgery for her foot and her anesthetist asked her if she knew she had apnea,  she did not. A year later she was tested. .    Sleep relevant medical history: Nocturia-none since CPAP-insomnia  In some nights. Obesity, sinus rhinitis-  Goiter.    Family medical /sleep history:Brother  on CPAP with OSA.    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  and lives in a household alone. No children. Pets are Present. 3 cats. 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  She had insomnia often at that time.  Tobacco use; none .   ETOH use; socially , Caffeine intake in form of Coffee( 1 cup in AM) Tea (not and iced -  3 or more a day) . Regular exercise in form of walking. Has knee pain.           Sleep habits are as follows: The  patient's dinner time is between 6 PM. The patient goes to bed at 11-12 PM and continues to sleep for intervals of 3 hours, wakes for 30 minutes, reads in book, not bathroom breaks.   The preferred sleep position is laterally , with the support of one pillow.GERD in the past, not currently.  Dreams are reportedly infrequent. 8  AM is the usual rise time.  The patient wakes up spontaneously.  She reports not feeling refreshed or restored in AM if not using CPAP-, but on CPAP she re rarely has  symptoms such as dry mouth , morning headaches , congestion- and residual fatigue.  Naps are taken rarely lasting from 15-30 minutes .      Reviews of CPAP download_  Tricia Dodson has used her CPAP compliantly for the last 30 days 29 out of 30 days with a compliance of over 4 hours for 90% of the time.  Average user time is 8 hours 28 minutes.  Her air sense 10 AutoSet which reportedly is about 66 years old or older as a serial #[id number] 06/25/1966 1.  It is on factory settings and was never tailored to her needs a minimum pressure is 4 maximum pressure is 20 EPR level is 3 cmH2O.  She has achieved a AHI residual of only 1.4 apneas and hypopneas per hour of use with these being obstructive in origin.  Air leaks are moderate to mild at the 95th percentile 11.3 L/min.  The pressure at the 95th percentile is only 8.9 cmH2O.  She is using a nasal mask , N 20.     REVIEW OF SYSTEMS: Out of a complete 14 system review of symptoms, the patient complains only of the following symptoms, and all other reviewed systems are negative.  ESS 7  ALLERGIES: Allergies  Allergen Reactions   Metformin  And Related Other (See Comments)    Diarrhea/ nausea and vomiting    HOME MEDICATIONS: Outpatient Medications Prior to Visit  Medication Sig Dispense Refill   amitriptyline  (ELAVIL ) 10 MG tablet Take 1 tablet (10 mg total) by mouth at bedtime. 90 tablet 3   atorvastatin  (LIPITOR) 10 MG tablet Take 1 tablet (10 mg total) by  mouth at bedtime. 90 tablet 0   Blood Glucose Monitoring Suppl DEVI 1 each by Does not apply route in the morning, at noon, and at bedtime. May substitute to any manufacturer covered by patient's insurance. 1 each 0   empagliflozin  (JARDIANCE ) 10 MG TABS tablet Take 1 tablet (10 mg total) by mouth daily before breakfast. 90 tablet 1   fenofibrate  micronized (LOFIBRA) 134 MG capsule TAKE 1  CAPSULE BY MOUTH DAILY BEFORE BREAKFAST (Patient taking differently: Take 134 mg by mouth daily.) 90 capsule 3   gabapentin  (NEURONTIN ) 300 MG capsule Take 1 capsule (300 mg total) by mouth 2 (two) times daily. 180 capsule 0   ibuprofen (ADVIL) 200 MG tablet Take 400 mg by mouth 2 (two) times daily as needed for moderate pain (pain score 4-6) or headache.     ipratropium (ATROVENT ) 0.03 % nasal spray Place 2 sprays into both nostrils every 12 (twelve) hours. (Patient taking differently: Place 2 sprays into both nostrils daily as needed for rhinitis.) 30 mL 0   levocetirizine (XYZAL ) 5 MG tablet TAKE 1 TABLET(5 MG) BY MOUTH DAILY (Patient taking differently: Take 5 mg by mouth at bedtime.) 90 tablet 3   levothyroxine  (SYNTHROID ) 25 MCG tablet TAKE 1 TABLET(25 MCG) BY MOUTH DAILY (Patient taking differently: Take 25 mcg by mouth See admin instructions. Take 1 tablet (25mcg) by mouth once daily at night, separate from food and other medications.) 90 tablet 3   losartan  (COZAAR ) 25 MG tablet Take 1 tablet (25 mg total) by mouth daily. 90 tablet 0   omeprazole  (PRILOSEC) 40 MG capsule Take 1 capsule (40 mg total) by mouth daily. 90 capsule 0   tirzepatide  (MOUNJARO ) 5 MG/0.5ML Pen Inject 5 mg into the skin once a week. 2 mL 2   No facility-administered medications prior to visit.    PAST MEDICAL HISTORY: Past Medical History:  Diagnosis Date   Allergy    Arthritis    Carpal tunnel syndrome, bilateral 11/04/2020   Both wrists.    Chicken pox    Colon polyps    CPAP (continuous positive airway pressure) dependence  11/30/2020   CPAP use counseling 11/04/2020   Diabetes mellitus without complication (HCC)    Type II   Family history of adverse reaction to anesthesia    Sister does not do well. usually has to stay overnight.   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 ENDOSCOPY;  Service: Gastroenterology;;   BIOPSY OF SKIN SUBCUTANEOUS TISSUE AND/OR MUCOUS MEMBRANE  10/10/2023   Procedure: BIOPSY, SKIN, SUBCUTANEOUS TISSUE, OR MUCOUS MEMBRANE;  Surgeon: Wilhelmenia Aloha Raddle., MD;  Location: [facility] ENDOSCOPY;  Service: Gastroenterology;;   CHOLECYSTECTOMY N/A 07/18/2023   Procedure: LAPAROSCOPIC SUBTOTAL CHOLECYSTECTOMY WITH ICG DYE;  Surgeon: Polly Cordella LABOR, MD;  Location: [facility] OR;  Service: General;  Laterality: N/A;   CYST EXCISION Right    Wrist   DILATION AND CURETTAGE OF UTERUS     in her 68s   ENDOSCOPIC RETROGRADE CHOLANGIOPANCREATOGRAPHY (ERCP) WITH PROPOFOL  N/A 07/23/2023   Procedure: ENDOSCOPIC RETROGRADE CHOLANGIOPANCREATOGRAPHY (ERCP) WITH PROPOFOL ;  Surgeon: Wilhelmenia Aloha Raddle., MD;  Location: [facility] ENDOSCOPY;  Service: Gastroenterology;  Laterality: N/A;   ENDOSCOPIC RETROGRADE CHOLANGIOPANCREATOGRAPHY (ERCP) WITH PROPOFOL  N/A 10/10/2023   Procedure: ENDOSCOPIC RETROGRADE CHOLANGIOPANCREATOGRAPHY (ERCP)  WITH PROPOFOL ;  Surgeon: Mansouraty, Aloha Raddle., MD;  Location: [facility] ENDOSCOPY;   Service: Gastroenterology;  Laterality: N/A;   HEMORROIDECTOMY     KNEE ARTHROSCOPY Right    PANCREATIC STENT PLACEMENT  07/23/2023   Procedure: PANCREATIC STENT PLACEMENT;  Surgeon: Wilhelmenia Aloha Raddle., MD;  Location: [facility] ENDOSCOPY;  Service: Gastroenterology;;   PLANTAR FASCIA SURGERY     REMOVAL OF STONES  07/23/2023   Procedure: REMOVAL OF SLUDGE;  Surgeon: Wilhelmenia Aloha Raddle., MD;  Location: [facility] ENDOSCOPY;  Service: Gastroenterology;;   ANNETT  07/23/2023   Procedure: ANNETT;  Surgeon: Mansouraty, Aloha Raddle., MD;  Location: [facility] ENDOSCOPY;  Service: Gastroenterology;;   CLEDA REMOVAL  10/10/2023   Procedure: STENT REMOVAL;  Surgeon: Wilhelmenia Aloha Raddle., MD;  Location: [facility] ENDOSCOPY;  Service: Gastroenterology;;   STONE EXTRACTION WITH BASKET  10/10/2023   Procedure: ERCP, WITH LITHROTRIPSY OR REMOVAL OF COMMON BILE DUCT CALCULUS;  Surgeon: Wilhelmenia Aloha Raddle., MD;  Location: [facility] ENDOSCOPY;  Service: Gastroenterology;;    FAMILY HISTORY: Family History  Problem Relation Age of Onset   Arthritis Mother    Diabetes Father    Heart disease Father    Arthritis Sister    Diabetes Sister    Hyperlipidemia Sister    Hyperlipidemia Brother    Heart disease Brother        triple heart bypass   Sleep apnea Brother     SOCIAL HISTORY: Social History   Socioeconomic History   Marital status: Single    Spouse name: Not on file   Number of children: Not on file   Years of education: Not on file   Highest education level: Bachelor's degree (e.g., BA, AB, BS)  Occupational History   Not on file  Tobacco Use   Smoking status: Never   Smokeless tobacco: Never  Vaping Use   Vaping status: Never Used  Substance and Sexual Activity   Alcohol use: Yes    Comment: Socially   Drug use: Not Currently   Sexual activity: Not Currently  Other Topics Concern   Not on file  Social History Narrative   Not on file   Social Drivers of Health   Financial  Resource Strain: Medium Risk (05/15/2024)   Overall Financial Resource Strain (CARDIA)    Difficulty of Paying Living Expenses: Somewhat hard  Food Insecurity: No Food Insecurity (05/15/2024)   Hunger Vital Sign    Worried About Running Out of Food in the Last Year: Never true    Ran Out of Food in the Last Year: Never true  Transportation Needs: No Transportation Needs (05/15/2024)   PRAPARE - Administrator, Civil Service (Medical): No    Lack of Transportation (Non-Medical): No  Physical Activity: Insufficiently Active (05/15/2024)   Exercise Vital Sign    Days of Exercise per Week: 3 days    Minutes of Exercise per Session: 20 min  Stress: No Stress Concern Present (05/15/2024)   [employer] of Occupational Health - Occupational Stress Questionnaire    Feeling of Stress: Only a little  Social Connections: Moderately Isolated (05/15/2024)   Social Connection and Isolation Panel    Frequency of Communication with Friends and Family: More than three times a week    Frequency of Social Gatherings with Friends and Family: Three times a week    Attends Religious Services: More than 4 times per year    Active Member of Clubs or Organizations: No    Attends Banker Meetings: Not on file  Marital Status: Never married  Intimate Partner Violence: Not At Risk (07/17/2023)   Humiliation, Afraid, Rape, and Kick questionnaire    Fear of Current or Ex-Partner: No    Emotionally Abused: No    Physically Abused: No    Sexually Abused: No      PHYSICAL EXAM  Vitals:   06/09/24 1041  BP: 134/87  Pulse: 77  Weight: 230 lb (104.3 kg)  Height: 5' 7 (1.702 m)     Body mass index is 36.02 kg/m.  Generalized: Well developed, in no acute distress  Chest: Lungs clear to auscultation bilaterally  Neurological examination  Mentation: Alert oriented to time, place, history taking. Follows all commands speech and language fluent Cranial nerve II-XII: Facial  symmetry noted   DIAGNOSTIC DATA (LABS, IMAGING, TESTING) - I reviewed patient records, labs, notes, testing and imaging myself where available.  Lab Results  Component Value Date   WBC 5.9 02/14/2024   HGB 14.1 02/14/2024   HCT 43.0 02/14/2024   MCV 83.2 02/14/2024   PLT 297.0 02/14/2024      Component Value Date/Time   NA 141 05/19/2024 0837   K 4.2 05/19/2024 0837   CL 103 05/19/2024 0837   CO2 29 05/19/2024 0837   GLUCOSE 107 (H) 05/19/2024 0837   BUN 16 05/19/2024 0837   CREATININE 1.10 05/19/2024 0837   CALCIUM  9.6 05/19/2024 0837   PROT 6.9 05/19/2024 0837   ALBUMIN 4.4 05/19/2024 0837   AST 26 05/19/2024 0837   ALT 30 05/19/2024 0837   ALKPHOS 64 05/19/2024 0837   BILITOT 0.8 05/19/2024 0837   GFRNONAA 55 (L) 12/09/2023 1005   Lab Results  Component Value Date   CHOL 126 05/19/2024   HDL 43.00 05/19/2024   LDLCALC 55 05/19/2024   LDLDIRECT 84.0 01/30/2022   TRIG 137.0 05/19/2024   CHOLHDL 3 05/19/2024   Lab Results  Component Value Date   HGBA1C 6.7 (H) 05/19/2024   No results found for: VITAMINB12 Lab Results  Component Value Date   TSH 2.01 02/14/2024      ASSESSMENT AND PLAN 73 y.o. year old female  has a past medical history of Allergy, Arthritis, Carpal tunnel syndrome, bilateral (11/04/2020), Chicken pox, Colon polyps, CPAP (continuous positive airway pressure) dependence (11/30/2020), CPAP use counseling (11/04/2020), Diabetes mellitus without complication (HCC), Family history of adverse reaction to anesthesia, GERD (gastroesophageal reflux disease), History of vein stripping (11/04/2020), Hyperlipidemia, Hypertension, Hypothyroidism, Morbid obesity (HCC) (11/30/2020), OSA on CPAP (11/30/2020), Plantar fasciitis, bilateral (11/04/2020), Retrognathia (11/30/2020), Shingles (2019), Thyroid  disease, Torn ligament (11/04/2020), and UTI (urinary tract infection). here with:  OSA on CPAP  -Good compliance -Good treatment of apnea - Encourage  patient to use CPAP nightly and > 4 hours each night - I will adjust pressure 7 to 15 cm of water.  If this does not comfortable we can change to a set pressure.  Also order mask refitting.  Patient was advised to call if she still finds the CPAP uncomfortable. - F/U in 1 year or sooner if needed  Orders Placed This Encounter  Procedures   For home use only DME continuous positive airway pressure (CPAP)    Increase Pressure: 7-15cmh20 Mask refitting- current mask is uncomfortable    Length of Need:   12 Months    Patient has OSA or probable OSA:   Yes    Is the patient currently using CPAP in the home:   Yes    Settings:   Other see comments  CPAP supplies needed:   Mask, headgear, cushions, filters, heated tubing and water chamber     Duwaine Russell, MSN, NP-C 06/09/2024, 11:03 AM [facility] [address], [address] [place], [place] [address] [phone]  The patient's condition requires frequent monitoring and adjustments in the treatment plan, reflecting the ongoing complexity of care.  This provider is the continuing focal point for all needed services for this condition.

## 2024-06-16 ENCOUNTER — Telehealth: Admitting: Physician Assistant

## 2024-06-16 DIAGNOSIS — A084 Viral intestinal infection, unspecified: Secondary | ICD-10-CM

## 2024-06-16 MED ORDER — ONDANSETRON 4 MG PO TBDP: 4.0000 mg | ORAL_TABLET | Freq: Three times a day (TID) | ORAL | 0 refills | Status: DC | PRN

## 2024-06-16 NOTE — Patient Instructions (Signed)
 Tricia Dodson, thank you for joining Tricia Velma Lunger, PA-C for today's virtual visit.  While this provider is not your primary care provider (PCP), if your PCP is located in our provider database this encounter information will be shared with them immediately following your visit.   A [facility] MyChart account gives you access to today's visit and all your visits, tests, and labs performed at [facility]  click here if you don't have a [facility] MyChart account or go to [email or web address]  Consent: (Patient) Tricia Dodson provided verbal consent for this virtual visit at the beginning of the encounter.  Current Medications:  Current Outpatient Medications:    amitriptyline  (ELAVIL ) 10 MG tablet, Take 1 tablet (10 mg total) by mouth at bedtime., Disp: 90 tablet, Rfl: 3   atorvastatin  (LIPITOR) 10 MG tablet, Take 1 tablet (10 mg total) by mouth at bedtime., Disp: 90 tablet, Rfl: 0   Blood Glucose Monitoring Suppl DEVI, 1 each by Does not apply route in the morning, at noon, and at bedtime. May substitute to any manufacturer covered by patient's insurance., Disp: 1 each, Rfl: 0   empagliflozin  (JARDIANCE ) 10 MG TABS tablet, Take 1 tablet (10 mg total) by mouth daily before breakfast., Disp: 90 tablet, Rfl: 1   fenofibrate  micronized (LOFIBRA) 134 MG capsule, TAKE 1 CAPSULE BY MOUTH DAILY BEFORE BREAKFAST (Patient taking differently: Take 134 mg by mouth daily.), Disp: 90 capsule, Rfl: 3   gabapentin  (NEURONTIN ) 300 MG capsule, Take 1 capsule (300 mg total) by mouth 2 (two) times daily., Disp: 180 capsule, Rfl: 0   ibuprofen (ADVIL) 200 MG tablet, Take 400 mg by mouth 2 (two) times daily as needed for moderate pain (pain score 4-6) or headache., Disp: , Rfl:    ipratropium (ATROVENT ) 0.03 % nasal spray, Place 2 sprays into both nostrils every 12 (twelve) hours. (Patient taking differently: Place 2 sprays into both nostrils daily as needed for rhinitis.), Disp: 30 mL,  Rfl: 0   levocetirizine (XYZAL ) 5 MG tablet, TAKE 1 TABLET(5 MG) BY MOUTH DAILY (Patient taking differently: Take 5 mg by mouth at bedtime.), Disp: 90 tablet, Rfl: 3   levothyroxine  (SYNTHROID ) 25 MCG tablet, TAKE 1 TABLET(25 MCG) BY MOUTH DAILY (Patient taking differently: Take 25 mcg by mouth See admin instructions. Take 1 tablet (25mcg) by mouth once daily at night, separate from food and other medications.), Disp: 90 tablet, Rfl: 3   losartan  (COZAAR ) 25 MG tablet, Take 1 tablet (25 mg total) by mouth daily., Disp: 90 tablet, Rfl: 0   omeprazole  (PRILOSEC) 40 MG capsule, Take 1 capsule (40 mg total) by mouth daily., Disp: 90 capsule, Rfl: 0   tirzepatide  (MOUNJARO ) 5 MG/0.5ML Pen, Inject 5 mg into the skin once a week., Disp: 2 mL, Rfl: 2   Medications ordered in this encounter:  No orders of the defined types were placed in this encounter.    *If you need refills on other medications prior to your next appointment, please contact your pharmacy*  Follow-Up: Call back or seek an in-person evaluation if the symptoms worsen or if the condition fails to improve as anticipated.  [facility] [phone]  Other Instructions Please continue to hydrate and rest. Sips of water, ginger ale or low sugar electrolyte water are preferable. As appetite returns, please follow the dietary recommendations below. If you note any recurrence of increased frequency of bowel movements, okay to take over-the-counter Imodium. Take the Zofran  as directed to calm nausea down. If you  note any non-resolving, new, or worsening symptoms despite treatment, please seek an in-person evaluation ASAP.   Food Choices to Help Relieve Diarrhea, Adult Diarrhea can make you feel weak and cause you to become dehydrated. Dehydration is a condition in which there is not enough water or other fluids in the body. It is important to choose the right foods and drinks to: Relieve diarrhea. Replace lost fluids and  nutrients. Prevent dehydration. What are tips for following this plan? Relieving diarrhea Avoid foods that make your diarrhea worse. These may include: Foods and drinks that are sweetened with high-fructose corn syrup, honey, or sweeteners such as xylitol, sorbitol, and mannitol. Check food labels for these ingredients. Fried, greasy, or spicy foods. Raw fruits and vegetables. Eat foods that are rich in probiotics. These include foods such as yogurt and fermented milk products. Probiotics can help increase healthy bacteria in your stomach and intestines (gastrointestinal or GI tract). This may help digestion and stop diarrhea. If you have lactose intolerance, avoid dairy products. These may make your diarrhea worse. Take medicine to help stop diarrhea only as told by your health care provider. Replacing nutrients  Eat bland, easy-to-digest foods in small amounts as you are able, until your diarrhea starts to get better. These foods include bananas, applesauce, rice, toast, and crackers. Over time, add nutrient-rich foods as your body tolerates them or as told by your health care provider. These include: Well-cooked protein foods, such as eggs, lean meats like fish or chicken without skin, and tofu. Peeled, seeded, and soft-cooked fruits and vegetables. Low-fat dairy products. Whole grains. Take vitamin and mineral supplements as told by your health care provider. Preventing dehydration  Start by sipping water or a solution to prevent dehydration (oral rehydration solution, or ORS). This is a drink that helps replace fluids and minerals your body has lost. You can buy an ORS at pharmacies and retail stores. Try to drink at least 8-10 cups (2,000-2,500 mL) of fluid each day to help replace lost fluids. If your urine is pale yellow, you are getting enough fluids. You may drink other liquids in addition to water, such as fruit juice that you have added water to (diluted fruit juice) or  low-calorie sports drinks, as tolerated or as told by your health care provider. Avoid drinks with caffeine, such as coffee, tea, or soft drinks. Avoid alcohol. This information is not intended to replace advice given to you by your health care provider. Make sure you discuss any questions you have with your health care provider. Document Revised: 12/12/2021 Document Reviewed: 12/12/2021 Elsevier Patient Education  2024 Elsevier Inc.  Viral Gastroenteritis, Adult  Viral gastroenteritis is also known as the stomach flu. This condition may affect your stomach, your small intestine, and your large intestine. It can cause sudden watery poop (diarrhea), fever, and vomiting. This condition is caused by certain germs (viruses). These germs can be passed from person to person very easily (are contagious). Having watery poop and vomiting can make you feel weak and cause you to not have enough water in your body (get dehydrated). This can make you tired and thirsty, make you have a dry mouth, and make it so you pee (urinate) less often. It is important to replace the fluids that you lose from having watery poop and vomiting. What are the causes? You can get sick by catching germs from other people. You can also get sick by: Eating food, drinking water, or touching a surface that has the germs on it (  is contaminated). Sharing utensils or other personal items with a person who is sick. What increases the risk? Having a weak body defense system (immune system). Living with one or more children who are younger than 2 years. Living in a nursing home. Going on cruise ships. What are the signs or symptoms? Symptoms of this condition start suddenly. Symptoms may last for a few days or for as long as a week. Common symptoms include: Watery poop. Vomiting. Other symptoms include: Fever. Headache. Feeling tired (fatigue). Pain in the belly (abdomen). Chills. Feeling weak. Feeling like you may vomit  (nauseous). Muscle aches. Not feeling hungry. How is this treated? This condition typically goes away on its own. The focus of treatment is to replace the fluids that you lose. This condition may be treated with: An ORS (oral rehydration solution). This is a drink that helps you replace fluids and minerals your body lost. It is sold at pharmacies and stores. Medicines to help with your symptoms. Probiotic supplements to reduce symptoms of watery poop. Fluids given through an IV tube, if needed. Older adults and people with other diseases or a weak body defense system are at higher risk for not having enough water in the body. Follow these instructions at home: Eating and drinking  Take an ORS as told by your doctor. Drink clear fluids in small amounts as you are able. Clear fluids include: Water. Ice chips. Fruit juice that has water added to it (is diluted). Low-calorie sports drinks. Drink enough fluid to keep your pee (urine) pale yellow. Eat small amounts of healthy foods every 3-4 hours as you are able. This may include whole grains, fruits, vegetables, lean meats, and yogurt. Avoid fluids that have a lot of sugar or caffeine in them. This includes energy drinks, sports drinks, and soda. Avoid spicy or fatty foods. Avoid alcohol. General instructions  Wash your hands often. This is very important after you have watery poop or you vomit. If you cannot use soap and water, use hand sanitizer. Make sure that all people in your home wash their hands well and often. Take over-the-counter and prescription medicines only as told by your doctor. Rest at home while you get better. Watch your condition for any changes. Take a warm bath to help with any burning or pain from having watery poop. Keep all follow-up visits. Contact a doctor if: You cannot keep fluids down. Your symptoms get worse. You have new symptoms. You feel light-headed or dizzy. You have muscle cramps. Get help  right away if: You have chest pain. You have trouble breathing, or you are breathing very fast. You have a fast heartbeat. You feel very weak or you faint. You have a very bad headache, a stiff neck, or both. You have a rash. You have very bad pain, cramping, or bloating in your belly. Your skin feels cold and clammy. You feel mixed up (confused). You have pain when you pee. You have signs of not having enough water in the body, such as: Dark pee, hardly any pee, or no pee. Cracked lips. Dry mouth. Sunken eyes. Feeling very sleepy. Feeling weak. You have signs of bleeding, such as: You see blood in your vomit. Your vomit looks like coffee grounds. You have bloody or black poop or poop that looks like tar. These symptoms may be an emergency. Get help right away. Call 911. Do not wait to see if the symptoms will go away. Do not drive yourself to the hospital. Summary Viral gastroenteritis  is also known as the stomach flu. This condition can cause sudden watery poop (diarrhea), fever, and vomiting. These germs can be passed from person to person very easily. Take an ORS (oral rehydration solution) as told by your doctor. This is a drink that is sold at pharmacies and stores. Wash your hands often, especially after having watery poop or vomiting. If you cannot use soap and water, use hand sanitizer. This information is not intended to replace advice given to you by your health care provider. Make sure you discuss any questions you have with your health care provider. Document Revised: 04/24/2021 Document Reviewed: 04/24/2021 Elsevier Patient Education  2024 Elsevier Inc.   If you have been instructed to have an in-person evaluation today at a local Urgent Care facility, please use the link below. It will take you to a list of all of our available [facility], including address, phone number and hours of operation. Please do not delay care.  [facility]  If you or a family member do not have a primary care provider, use the link below to schedule a visit and establish care. When you choose a [facility] primary care physician or advanced practice provider, you gain a long-term partner in health. Find a Primary Care Provider  Learn more about [facility]'s in-office and virtual care options: [facility] - Get Care Now

## 2024-06-16 NOTE — Progress Notes (Signed)
 Virtual Visit Consent   Tricia Dodson, you are scheduled for a virtual visit with a [facility] provider today. Just as with appointments in the office, your consent must be obtained to participate. Your consent will be active for this visit and any virtual visit you may have with one of our providers in the next 365 days. If you have a MyChart account, a copy of this consent can be sent to you electronically.  As this is a virtual visit, video technology does not allow for your provider to perform a traditional examination. This may limit your provider's ability to fully assess your condition. If your provider identifies any concerns that need to be evaluated in person or the need to arrange testing (such as labs, EKG, etc.), we will make arrangements to do so. Although advances in technology are sophisticated, we cannot ensure that it will always work on either your end or our end. If the connection with a video visit is poor, the visit may have to be switched to a telephone visit. With either a video or telephone visit, we are not always able to ensure that we have a secure connection.  By engaging in this virtual visit, you consent to the provision of healthcare and authorize for your insurance to be billed (if applicable) for the services provided during this visit. Depending on your insurance coverage, you may receive a charge related to this service.  I need to obtain your verbal consent now. Are you willing to proceed with your visit today? Tricia Dodson has provided verbal consent on 06/16/2024 for a virtual visit (video or telephone). Tricia Dodson, [place]  Date: 06/16/2024 9:38 AM   Virtual Visit via Video Note   I, Tricia Dodson, connected with  Tricia Dodson  ([id number], January 18, 1951) on 06/16/24 at  9:15 AM EST by a video-enabled telemedicine application and verified that I am speaking with the correct person using two identifiers.  Location: Patient: Virtual Visit  Location Patient: Home Provider: Virtual Visit Location Provider: Home Office   I discussed the limitations of evaluation and management by telemedicine and the availability of in person appointments. The patient expressed understanding and agreed to proceed.    History of Present Illness: Tricia Dodson is a 73 y.o. who identifies as a female who was assigned female at birth, and is being seen today for GI symptoms starting Friday night. Notes near bedtime with some chills, low-grade fever and malaise. Overnight with nausea and frequent loose stool.  Denies melena, hematochezia or tenesmus. Denies emesis. Notes most symptoms have resolved except for mild headache, lingering nausea and fatigue. Has been able to hydrate but only eaten toast since the weekend. Denies recent travel or known sick contact. Has been taking Advil for her headache. Denies alcohol consumption.   HPI: HPI  Problems:  Patient Active Problem List   Diagnosis Date Noted   Allergic rhinitis 05/15/2024   Insomnia 05/15/2024   Knee pain 05/15/2024   Post herpetic neuralgia 05/15/2024   Type 2 diabetes mellitus with hyperglycemia, without long-term current use of insulin  (HCC) 04/09/2024   Biliary sludge 10/11/2023   Abdominal pain, chronic, epigastric 10/10/2023   S/P cholecystectomy 10/10/2023   History of biliary stent insertion 10/10/2023   Bile leak 10/10/2023   Gastritis and gastroduodenitis 07/23/2023   Bile leak, postoperative 07/19/2023   Acute gangrenous cholecystitis 07/17/2023   Cholecystitis, acute 07/17/2023   AKI (acute kidney injury) 07/17/2023   Primary osteoarthritis of both knees 04/18/2021   Dizziness  12/02/2020   Nonspecific abnormal electrocardiogram (ECG) (EKG) 12/02/2020   UTI (urinary tract infection)    Hypothyroidism    Hypertension    Hyperlipidemia    HA (headache)    GERD (gastroesophageal reflux disease)    Colon polyps    Chicken pox    Arthritis    Allergy    Morbid obesity  (HCC) 11/30/2020   Retrognathia 11/30/2020   CPAP (continuous positive airway pressure) dependence 11/30/2020   OSA on CPAP 11/30/2020   CPAP use counseling 11/04/2020   Carpal tunnel syndrome, bilateral 11/04/2020   Plantar fasciitis, bilateral 11/04/2020   Torn ligament 11/04/2020   History of vein stripping 11/04/2020   Shingles 2019    Allergies:  Allergies  Allergen Reactions   Metformin  And Related Other (See Comments)    Diarrhea/ nausea and vomiting   Medications:  Current Outpatient Medications:    ondansetron  (ZOFRAN -ODT) 4 MG disintegrating tablet, Take 1 tablet (4 mg total) by mouth every 8 (eight) hours as needed for nausea or vomiting., Disp: 20 tablet, Rfl: 0   amitriptyline  (ELAVIL ) 10 MG tablet, Take 1 tablet (10 mg total) by mouth at bedtime., Disp: 90 tablet, Rfl: 3   atorvastatin  (LIPITOR) 10 MG tablet, Take 1 tablet (10 mg total) by mouth at bedtime., Disp: 90 tablet, Rfl: 0   Blood Glucose Monitoring Suppl DEVI, 1 each by Does not apply route in the morning, at noon, and at bedtime. May substitute to any manufacturer covered by patient's insurance., Disp: 1 each, Rfl: 0   empagliflozin  (JARDIANCE ) 10 MG TABS tablet, Take 1 tablet (10 mg total) by mouth daily before breakfast., Disp: 90 tablet, Rfl: 1   fenofibrate  micronized (LOFIBRA) 134 MG capsule, TAKE 1 CAPSULE BY MOUTH DAILY BEFORE BREAKFAST (Patient taking differently: Take 134 mg by mouth daily.), Disp: 90 capsule, Rfl: 3   gabapentin  (NEURONTIN ) 300 MG capsule, Take 1 capsule (300 mg total) by mouth 2 (two) times daily., Disp: 180 capsule, Rfl: 0   ibuprofen (ADVIL) 200 MG tablet, Take 400 mg by mouth 2 (two) times daily as needed for moderate pain (pain score 4-6) or headache., Disp: , Rfl:    ipratropium (ATROVENT ) 0.03 % nasal spray, Place 2 sprays into both nostrils every 12 (twelve) hours. (Patient taking differently: Place 2 sprays into both nostrils daily as needed for rhinitis.), Disp: 30 mL, Rfl: 0    levocetirizine (XYZAL ) 5 MG tablet, TAKE 1 TABLET(5 MG) BY MOUTH DAILY (Patient taking differently: Take 5 mg by mouth at bedtime.), Disp: 90 tablet, Rfl: 3   levothyroxine  (SYNTHROID ) 25 MCG tablet, TAKE 1 TABLET(25 MCG) BY MOUTH DAILY (Patient taking differently: Take 25 mcg by mouth See admin instructions. Take 1 tablet (25mcg) by mouth once daily at night, separate from food and other medications.), Disp: 90 tablet, Rfl: 3   losartan  (COZAAR ) 25 MG tablet, Take 1 tablet (25 mg total) by mouth daily., Disp: 90 tablet, Rfl: 0   omeprazole  (PRILOSEC) 40 MG capsule, Take 1 capsule (40 mg total) by mouth daily., Disp: 90 capsule, Rfl: 0   tirzepatide  (MOUNJARO ) 5 MG/0.5ML Pen, Inject 5 mg into the skin once a week., Disp: 2 mL, Rfl: 2  Observations/Objective: Patient is well-developed, well-nourished in no acute distress.  Resting comfortably  at home.  Head is normocephalic, atraumatic.  No labored breathing.  Speech is clear and coherent with logical content.  Patient is alert and oriented at baseline.   Assessment and Plan: 1. Viral gastroenteritis (Primary) - ondansetron  (ZOFRAN -ODT)  4 MG disintegrating tablet; Take 1 tablet (4 mg total) by mouth every 8 (eight) hours as needed for nausea or vomiting.  Dispense: 20 tablet; Refill: 0  No alarm signs or symptoms. Is tolerating PO fluids. Supportive measures and OTC medications reviewed. Zofran  per orders. Strict in-person evaluation precautions reviewed.  Follow Up Instructions: I discussed the assessment and treatment plan with the patient. The patient was provided an opportunity to ask questions and all were answered. The patient agreed with the plan and demonstrated an understanding of the instructions.  A copy of instructions were sent to the patient via MyChart unless otherwise noted below.   The patient was advised to call back or seek an in-person evaluation if the symptoms worsen or if the condition fails to improve as anticipated.     Tricia Velma Lunger, PA-C

## 2024-06-17 ENCOUNTER — Telehealth: Payer: Self-pay | Admitting: Family

## 2024-06-17 NOTE — Telephone Encounter (Signed)
 Copied from CRM #[id number]. Topic: Medicare AWV >> Jun 17, 2024 11:11 AM Nathanel DEL wrote: Called LVM 06/17/2024 to sched AWV. Please schedule in office or video visit   Nathanel Paschal; Care Guide Ambulatory Clinical Support [facility] Direct Dial: [phone]

## 2024-06-30 ENCOUNTER — Telehealth: Payer: Self-pay | Admitting: *Deleted

## 2024-06-30 NOTE — Telephone Encounter (Signed)
 Cpap order signed by provider and faxed to [facility] this am

## 2024-07-14 ENCOUNTER — Other Ambulatory Visit (HOSPITAL_BASED_OUTPATIENT_CLINIC_OR_DEPARTMENT_OTHER): Payer: Self-pay

## 2024-07-14 ENCOUNTER — Ambulatory Visit: Admitting: Family

## 2024-07-14 VITALS — BP 118/63 | HR 73 | Temp 97.9°F | Resp 16 | Ht 66.0 in | Wt 225.0 lb

## 2024-07-14 DIAGNOSIS — Z7985 Long-term (current) use of injectable non-insulin antidiabetic drugs: Secondary | ICD-10-CM | POA: Diagnosis not present

## 2024-07-14 DIAGNOSIS — M5416 Radiculopathy, lumbar region: Secondary | ICD-10-CM

## 2024-07-14 DIAGNOSIS — G4700 Insomnia, unspecified: Secondary | ICD-10-CM

## 2024-07-14 DIAGNOSIS — H02403 Unspecified ptosis of bilateral eyelids: Secondary | ICD-10-CM | POA: Diagnosis not present

## 2024-07-14 DIAGNOSIS — E1165 Type 2 diabetes mellitus with hyperglycemia: Secondary | ICD-10-CM | POA: Diagnosis not present

## 2024-07-14 MED ORDER — METHYLPREDNISOLONE 4 MG PO TBPK
ORAL_TABLET | ORAL | 0 refills | Status: AC
  Filled 2024-07-14: qty 21, 6d supply, fill #0

## 2024-07-14 MED ORDER — AMITRIPTYLINE HCL 10 MG PO TABS: 10.0000 mg | ORAL_TABLET | Freq: Every day | ORAL | 1 refills | Status: AC

## 2024-07-14 MED ORDER — TIRZEPATIDE 7.5 MG/0.5ML ~~LOC~~ SOAJ
7.5000 mg | SUBCUTANEOUS | 0 refills | Status: AC
  Filled 2024-07-14 – 2024-08-03 (×2): qty 6, 84d supply, fill #0

## 2024-07-14 MED ORDER — METHYLPREDNISOLONE 4 MG PO TBPK: ORAL_TABLET | ORAL | 0 refills | Status: DC

## 2024-07-14 MED ORDER — GABAPENTIN 300 MG PO CAPS: 300.0000 mg | ORAL_CAPSULE | Freq: Three times a day (TID) | ORAL | 0 refills | Status: DC

## 2024-07-14 NOTE — Progress Notes (Signed)
 "  Subjective:     Patient ID: Tricia Dodson, female    DOB: 1951/02/20, 74 y.o.   MRN: [id number]  Chief Complaint  Patient presents with   Leg Pain    Patient complains of pain on both legs, more on the right. Started 10 days ago    HPI  Discussed the use of AI scribe software for clinical note transcription with the patient, who gave verbal consent to proceed.  History of Present Illness Tricia Dodson is a 74 year old female with post-herpetic neuralgia who presents with right leg pain.  She has been experiencing right leg pain for approximately ten days, extending from the upper leg down to almost the ankle, with significant pain in the calf area. The pain is primarily on the right side but has recently started affecting the left side as well. It is described as pulsating, particularly in the calf region, and is severe enough to disrupt her sleep, waking her after an hour or an hour and a half of rest. No swelling in the legs and no unusual symptoms related to blood sugar levels.  She has been taking ibuprofen intermittently for the pain but is cautious due to existing kidney issues. She is also on gabapentin , which she takes twice a day. She has tried gentle stretching exercises, which did not alleviate the pain.  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 She also experiences weakness in her legs, particularly when climbing stairs, and has had instances where her knees have given out, necessitating the use of support.  She is currently taking Mounjaro  and has noticed her appetite returning, although she has not gained weight. She is also on amitriptyline  and Jardiance , with recent issues regarding prescription refills.     Lab Results  Component Value Date   HGBA1C 6.7 (H) 05/19/2024   HGBA1C 7.9 (H)  02/14/2024   HGBA1C 7.6 (H) 12/09/2023   Lab Results  Component Value Date   MICROALBUR 2.0 (H) 05/19/2024   LDLCALC 55 05/19/2024   CREATININE 1.10 05/19/2024    Health Maintenance Due  Topic Date Due   Medicare Annual Wellness (AWV)  Never done   Hepatitis C Screening  Never done   Influenza Vaccine  02/07/2024   Mammogram  05/07/2024   OPHTHALMOLOGY EXAM  06/04/2024    Past Medical History:  Diagnosis Date   Allergy    Arthritis    Carpal tunnel syndrome, bilateral 11/04/2020   Both wrists.    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   CPAP use counseling 11/04/2020   Diabetes mellitus without complication (HCC)    Type II   Family history of adverse reaction to anesthesia    Sister does not do well. usually has to stay overnight.   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   Shingles 2019   Thyroid  disease    Torn ligament 11/04/2020   Right knee   UTI (urinary tract infection)     Past Surgical History:  Procedure Laterality Date  BILATERAL CARPAL TUNNEL RELEASE     BILIARY STENT PLACEMENT  07/23/2023   Procedure: BILIARY STENT PLACEMENT;  Surgeon: Wilhelmenia Aloha Raddle., MD;  Location: [facility] ENDOSCOPY;  Service: Gastroenterology;;   BIOPSY OF SKIN SUBCUTANEOUS TISSUE AND/OR MUCOUS MEMBRANE  10/10/2023   Procedure: BIOPSY, SKIN, SUBCUTANEOUS TISSUE, OR MUCOUS MEMBRANE;  Surgeon: Wilhelmenia Aloha Raddle., MD;  Location: [facility] ENDOSCOPY;  Service: Gastroenterology;;   CHOLECYSTECTOMY N/A 07/18/2023   Procedure: LAPAROSCOPIC SUBTOTAL CHOLECYSTECTOMY WITH ICG DYE;  Surgeon: Polly Cordella LABOR, MD;  Location: [facility] OR;  Service: General;  Laterality: N/A;   CYST  EXCISION Right    Wrist   DILATION AND CURETTAGE OF UTERUS     in her 83s   ENDOSCOPIC RETROGRADE CHOLANGIOPANCREATOGRAPHY (ERCP) WITH PROPOFOL  N/A 07/23/2023   Procedure: ENDOSCOPIC RETROGRADE CHOLANGIOPANCREATOGRAPHY (ERCP) WITH PROPOFOL ;  Surgeon: Wilhelmenia Aloha Raddle., MD;  Location: [facility] ENDOSCOPY;  Service: Gastroenterology;  Laterality: N/A;   ENDOSCOPIC RETROGRADE CHOLANGIOPANCREATOGRAPHY (ERCP) WITH PROPOFOL  N/A 10/10/2023   Procedure: ENDOSCOPIC RETROGRADE CHOLANGIOPANCREATOGRAPHY (ERCP) WITH PROPOFOL ;  Surgeon: Wilhelmenia Aloha Raddle., MD;  Location: [facility] ENDOSCOPY;  Service: Gastroenterology;  Laterality: N/A;   HEMORROIDECTOMY     KNEE ARTHROSCOPY Right    PANCREATIC STENT PLACEMENT  07/23/2023   Procedure: PANCREATIC STENT PLACEMENT;  Surgeon: Wilhelmenia, Aloha Raddle., MD;  Location: [facility] ENDOSCOPY;  Service: Gastroenterology;;   PLANTAR FASCIA SURGERY     REMOVAL OF STONES  07/23/2023   Procedure: REMOVAL OF SLUDGE;  Surgeon: Wilhelmenia Aloha Raddle., MD;  Location: [facility] ENDOSCOPY;  Service: Gastroenterology;;   ANNETT  07/23/2023   Procedure: ANNETT;  Surgeon: Mansouraty, Aloha Raddle., MD;  Location: [facility] ENDOSCOPY;  Service: Gastroenterology;;   CLEDA REMOVAL  10/10/2023   Procedure: STENT REMOVAL;  Surgeon: Wilhelmenia Aloha Raddle., MD;  Location: [facility] ENDOSCOPY;  Service: Gastroenterology;;   STONE EXTRACTION WITH BASKET  10/10/2023   Procedure: ERCP, WITH LITHROTRIPSY OR REMOVAL OF COMMON BILE DUCT CALCULUS;  Surgeon: Wilhelmenia Aloha Raddle., MD;  Location: [facility] ENDOSCOPY;  Service: Gastroenterology;;    Family History  Problem Relation Age of Onset   Arthritis Mother    Diabetes Father    Heart disease Father    Arthritis Sister    Diabetes Sister    Hyperlipidemia Sister    Hyperlipidemia Brother    Heart disease Brother        triple heart bypass   Sleep apnea Brother     Social History   Socioeconomic History   Marital status: Single    Spouse name:  Not on file   Number of children: Not on file   Years of education: Not on file   Highest education level: Bachelor's degree (e.g., BA, AB, BS)  Occupational History   Not on file  Tobacco Use   Smoking status: Never   Smokeless tobacco: Never  Vaping Use   Vaping status: Never Used  Substance and Sexual Activity   Alcohol use: Yes    Comment: Socially   Drug use: Not Currently   Sexual activity: Not Currently  Other Topics Concern   Not on file  Social History Narrative   Not on file   Social Drivers of Health   Tobacco Use: Low Risk (06/09/2024)   Patient History    Smoking Tobacco Use: Never    Smokeless Tobacco Use: Never    Passive Exposure: Not on file  Financial Resource Strain: Medium Risk (05/15/2024)   Overall Financial Resource Strain (CARDIA)    Difficulty of Paying Living Expenses: Somewhat hard  Food Insecurity:  No Food Insecurity (05/15/2024)   Epic    Worried About Programme Researcher, Broadcasting/film/video in the Last Year: Never true    Ran Out of Food in the Last Year: Never true  Transportation Needs: No Transportation Needs (05/15/2024)   Epic    Lack of Transportation (Medical): No    Lack of Transportation (Non-Medical): No  Physical Activity: Insufficiently Active (05/15/2024)   Exercise Vital Sign    Days of Exercise per Week: 3 days    Minutes of Exercise per Session: 20 min  Stress: No Stress Concern Present (05/15/2024)   [employer] of Occupational Health - Occupational Stress Questionnaire    Feeling of Stress: Only a little  Social Connections: Moderately Isolated (05/15/2024)   Social Connection and Isolation Panel    Frequency of Communication with Friends and Family: More than three times a week    Frequency of Social Gatherings with Friends and Family: Three times a week    Attends Religious Services: More than 4 times per year    Active Member of Clubs or Organizations: No    Attends Banker Meetings: Not on file    Marital Status: Never  married  Intimate Partner Violence: Not At Risk (07/17/2023)   Humiliation, Afraid, Rape, and Kick questionnaire    Fear of Current or Ex-Partner: No    Emotionally Abused: No    Physically Abused: No    Sexually Abused: No  Depression (PHQ2-9): Low Risk (02/28/2024)   Depression (PHQ2-9)    PHQ-2 Score: 1  Alcohol Screen: Low Risk (05/15/2024)   Alcohol Screen    Last Alcohol Screening Score (AUDIT): 1  Housing: Low Risk (05/15/2024)   Epic    Unable to Pay for Housing in the Last Year: No    Number of Times Moved in the Last Year: 0    Homeless in the Last Year: No  Utilities: Not At Risk (07/17/2023)   [facility] Utilities    Threatened with loss of utilities: No  Health Literacy: Not on file    Outpatient Medications Prior to Visit  Medication Sig Dispense Refill   atorvastatin  (LIPITOR) 10 MG tablet Take 1 tablet (10 mg total) by mouth at bedtime. 90 tablet 0   Blood Glucose Monitoring Suppl DEVI 1 each by Does not apply route in the morning, at noon, and at bedtime. May substitute to any manufacturer covered by patient's insurance. 1 each 0   empagliflozin  (JARDIANCE ) 10 MG TABS tablet Take 1 tablet (10 mg total) by mouth daily before breakfast. 90 tablet 1   fenofibrate  micronized (LOFIBRA) 134 MG capsule TAKE 1 CAPSULE BY MOUTH DAILY BEFORE BREAKFAST (Patient taking differently: Take 134 mg by mouth daily.) 90 capsule 3   ibuprofen (ADVIL) 200 MG tablet Take 400 mg by mouth 2 (two) times daily as needed for moderate pain (pain score 4-6) or headache.     ipratropium (ATROVENT ) 0.03 % nasal spray Place 2 sprays into both nostrils every 12 (twelve) hours. (Patient taking differently: Place 2 sprays into both nostrils daily as needed for rhinitis.) 30 mL 0   levocetirizine (XYZAL ) 5 MG tablet TAKE 1 TABLET(5 MG) BY MOUTH DAILY (Patient taking differently: Take 5 mg by mouth at bedtime.) 90 tablet 3   levothyroxine  (SYNTHROID ) 25 MCG tablet TAKE 1 TABLET(25 MCG) BY MOUTH DAILY (Patient taking  differently: Take 25 mcg by mouth See admin instructions. Take 1 tablet (25mcg) by mouth once daily at night, separate from food and other medications.) 90 tablet  3   losartan  (COZAAR ) 25 MG tablet Take 1 tablet (25 mg total) by mouth daily. 90 tablet 0   omeprazole  (PRILOSEC) 40 MG capsule Take 1 capsule (40 mg total) by mouth daily. 90 capsule 0   amitriptyline  (ELAVIL ) 10 MG tablet Take 1 tablet (10 mg total) by mouth at bedtime. 90 tablet 3   gabapentin  (NEURONTIN ) 300 MG capsule Take 1 capsule (300 mg total) by mouth 2 (two) times daily. 180 capsule 0   tirzepatide  (MOUNJARO ) 5 MG/0.5ML Pen Inject 5 mg into the skin once a week. 2 mL 2   ondansetron  (ZOFRAN -ODT) 4 MG disintegrating tablet Take 1 tablet (4 mg total) by mouth every 8 (eight) hours as needed for nausea or vomiting. 20 tablet 0   No facility-administered medications prior to visit.    Allergies[1]  ROS    See HPI Objective:    Physical Exam Constitutional:      General: She is not in acute distress.    Appearance: Normal appearance. She is well-developed.  HENT:     Head: Normocephalic and atraumatic.     Right Ear: External ear normal.     Left Ear: External ear normal.  Eyes:     General: No scleral icterus.    Comments: Drooping of bilateral upper eyelids  Neck:     Thyroid : No thyromegaly.  Cardiovascular:     Rate and Rhythm: Normal rate and regular rhythm.     Heart sounds: Normal heart sounds. No murmur heard. Pulmonary:     Effort: Pulmonary effort is normal. No respiratory distress.     Breath sounds: Normal breath sounds. No wheezing.  Musculoskeletal:     Cervical back: Neck supple.  Skin:    General: Skin is warm and dry.  Neurological:     Mental Status: She is alert and oriented to person, place, and time.  Psychiatric:        Mood and Affect: Mood normal.        Behavior: Behavior normal.        Thought Content: Thought content normal.        Judgment: Judgment normal.      BP  118/63 (BP Location: Right Arm, Patient Position: Sitting, Cuff Size: Large)   Pulse 73   Temp 97.9 F (36.6 C) (Oral)   Resp 16   Ht 5' 6 (1.676 m)   Wt 225 lb (102.1 kg)   SpO2 99%   BMI 36.32 kg/m  Wt Readings from Last 3 Encounters:  07/14/24 225 lb (102.1 kg)  06/09/24 230 lb (104.3 kg)  05/15/24 238 lb (108 kg)       Assessment & Plan:   Problem List Items Addressed This Visit       Unprioritized   Type 2 diabetes mellitus with hyperglycemia, without long-term current use of insulin  (HCC)    Blood glucose levels well-controlled; steroids may temporarily increase levels. - Monitor blood glucose twice daily while on steroids. - Contact provider if blood glucose >350 mg/dL.       Relevant Medications   tirzepatide  (MOUNJARO ) 7.5 MG/0.5ML Pen   Ptosis of both eyelids    Ptosis affecting vision; referral for blepharoplasty consultation provided. - Provided referral to plastic surgery for blepharoplasty consultation.       Relevant Medications   gabapentin  (NEURONTIN ) 300 MG capsule   Other Relevant Orders   Ambulatory referral to Plastic Surgery   Lumbar radiculopathy - Primary    Right leg pain with weakness, likely  due to nerve compression in the lumbar region, possibly sciatica. - Increased gabapentin  to three times daily if tolerated. - Prescribed short course of steroids to reduce inflammation. - Monitor blood glucose twice daily while on steroids; report if >350 mg/dL. - Sent steroid prescription to pharmacy. - Advised to discuss leg pain with orthopedist.      Relevant Medications   methylPREDNISolone  (MEDROL  DOSEPAK) 4 MG TBPK tablet   amitriptyline  (ELAVIL ) 10 MG tablet   gabapentin  (NEURONTIN ) 300 MG capsule   Insomnia   Relevant Medications   amitriptyline  (ELAVIL ) 10 MG tablet   Assessment and Plan Assessment & Plan    I have discontinued [name] Tess's Mounjaro  and ondansetron . I have also changed her gabapentin .  Additionally, I am having her start on tirzepatide . Lastly, I am having her maintain her ipratropium, fenofibrate  micronized, levothyroxine , levocetirizine, ibuprofen, Blood Glucose Monitoring Suppl, empagliflozin , omeprazole , atorvastatin , losartan , methylPREDNISolone , and amitriptyline .  Meds ordered this encounter  Medications   DISCONTD: methylPREDNISolone  (MEDROL  DOSEPAK) 4 MG TBPK tablet    Sig: Take per package instructions.    Dispense:  21 tablet    Refill:  0    Supervising Provider:   DOMENICA BLACKBIRD A [[id number]]   methylPREDNISolone  (MEDROL  DOSEPAK) 4 MG TBPK tablet    Sig: Take per package instructions.    Dispense:  21 tablet    Refill:  0    Supervising Provider:   DOMENICA BLACKBIRD A [[id number]]   tirzepatide  (MOUNJARO ) 7.5 MG/0.5ML Pen    Sig: Inject 7.5 mg into the skin once a week.    Dispense:  6 mL    Refill:  0    Supervising Provider:   DOMENICA BLACKBIRD A [[id number]]   amitriptyline  (ELAVIL ) 10 MG tablet    Sig: Take 1 tablet (10 mg total) by mouth at bedtime.    Dispense:  90 tablet    Refill:  1    Supervising Provider:   DOMENICA BLACKBIRD A [[id number]]   gabapentin  (NEURONTIN ) 300 MG capsule    Sig: Take 1 capsule (300 mg total) by mouth 3 (three) times daily.    Dispense:  270 capsule    Refill:  0    Supervising Provider:   DOMENICA BLACKBIRD A [[id number]]      [1]  Allergies Allergen Reactions   Metformin  And Related Other (See Comments)    Diarrhea/ nausea and vomiting   "

## 2024-07-16 ENCOUNTER — Encounter (HOSPITAL_BASED_OUTPATIENT_CLINIC_OR_DEPARTMENT_OTHER): Payer: Self-pay

## 2024-07-16 ENCOUNTER — Ambulatory Visit (HOSPITAL_BASED_OUTPATIENT_CLINIC_OR_DEPARTMENT_OTHER)
Admission: RE | Admit: 2024-07-16 | Discharge: 2024-07-16 | Disposition: A | Discharge status: Home / Self Care | Source: Ambulatory Visit | Attending: Family | Admitting: Family

## 2024-07-16 DIAGNOSIS — Z1231 Encounter for screening mammogram for malignant neoplasm of breast: Secondary | ICD-10-CM | POA: Insufficient documentation

## 2024-07-16 DIAGNOSIS — H02403 Unspecified ptosis of bilateral eyelids: Secondary | ICD-10-CM | POA: Insufficient documentation

## 2024-07-16 DIAGNOSIS — M5416 Radiculopathy, lumbar region: Secondary | ICD-10-CM | POA: Insufficient documentation

## 2024-07-16 NOTE — Assessment & Plan Note (Signed)
" °  Ptosis affecting vision; referral for blepharoplasty consultation provided. - Provided referral to plastic surgery for blepharoplasty consultation.  "

## 2024-07-16 NOTE — Patient Instructions (Signed)
" °  VISIT SUMMARY: Today, you were seen for right leg pain that has been affecting you for the past ten days. We discussed your symptoms, including the pulsating pain in your calf that disrupts your sleep, and reviewed your current medications and treatments. We also addressed your type 2 diabetes management and ptosis of both eyelids.  YOUR PLAN: -LUMBAR RADICULOPATHY: Lumbar radiculopathy is a condition where a nerve in the lower back is compressed, causing pain and weakness in the leg. We have increased your gabapentin  to three times daily if you can tolerate it and prescribed a short course of steroids to reduce inflammation. Please monitor your blood glucose levels twice daily while on steroids and report if they exceed 350 mg/dL. We have sent the steroid prescription to your pharmacy. Additionally, you should discuss your leg pain with an orthopedist.  -TYPE 2 DIABETES MELLITUS: Type 2 diabetes is a condition where your body does not use insulin  properly, leading to high blood sugar levels. Your blood glucose levels are well-controlled, but steroids may temporarily increase them. Please monitor your blood glucose levels twice daily while on steroids and contact us  if they exceed 350 mg/dL.  -PTOSIS OF BOTH EYELIDS: Ptosis is the drooping of the upper eyelids, which can affect your vision. We have provided a referral for a consultation with a plastic surgeon to discuss the possibility of blepharoplasty, a surgical procedure to correct this issue.  INSTRUCTIONS: Please monitor your blood glucose levels twice daily while on steroids and report if they exceed 350 mg/dL. Discuss your leg pain with an orthopedist. Follow up with the plastic surgeon for a blepharoplasty consultation as referred.                      Contains text generated by [employer].                        "

## 2024-07-16 NOTE — Assessment & Plan Note (Signed)
" °  Blood glucose levels well-controlled; steroids may temporarily increase levels. - Monitor blood glucose twice daily while on steroids. - Contact provider if blood glucose >350 mg/dL.  "

## 2024-07-16 NOTE — Assessment & Plan Note (Signed)
" °  Right leg pain with weakness, likely due to nerve compression in the lumbar region, possibly sciatica. - Increased gabapentin  to three times daily if tolerated. - Prescribed short course of steroids to reduce inflammation. - Monitor blood glucose twice daily while on steroids; report if >350 mg/dL. - Sent steroid prescription to pharmacy. - Advised to discuss leg pain with orthopedist. "

## 2024-07-20 ENCOUNTER — Other Ambulatory Visit: Payer: Self-pay | Admitting: Family

## 2024-07-20 DIAGNOSIS — R928 Other abnormal and inconclusive findings on diagnostic imaging of breast: Secondary | ICD-10-CM

## 2024-07-25 ENCOUNTER — Ambulatory Visit
Admission: RE | Admit: 2024-07-25 | Discharge: 2024-07-25 | Disposition: A | Discharge status: Home / Self Care | Source: Ambulatory Visit | Attending: Family | Admitting: Family

## 2024-07-25 DIAGNOSIS — R928 Other abnormal and inconclusive findings on diagnostic imaging of breast: Secondary | ICD-10-CM

## 2024-07-25 DIAGNOSIS — N6321 Unspecified lump in the left breast, upper outer quadrant: Secondary | ICD-10-CM

## 2024-07-28 ENCOUNTER — Encounter

## 2024-07-28 ENCOUNTER — Other Ambulatory Visit

## 2024-07-30 ENCOUNTER — Ambulatory Visit
Admission: RE | Admit: 2024-07-30 | Discharge: 2024-07-30 | Disposition: A | Discharge status: Home / Self Care | Source: Ambulatory Visit | Attending: Family | Admitting: Family

## 2024-07-30 ENCOUNTER — Other Ambulatory Visit: Payer: Self-pay | Admitting: Family

## 2024-07-30 ENCOUNTER — Inpatient Hospital Stay: Admission: RE | Admit: 2024-07-30

## 2024-07-30 DIAGNOSIS — N6321 Unspecified lump in the left breast, upper outer quadrant: Secondary | ICD-10-CM

## 2024-07-30 DIAGNOSIS — R928 Other abnormal and inconclusive findings on diagnostic imaging of breast: Secondary | ICD-10-CM

## 2024-07-30 HISTORY — PX: BREAST BIOPSY: SHX20

## 2024-07-31 LAB — SURGICAL PATHOLOGY

## 2024-08-03 ENCOUNTER — Other Ambulatory Visit (HOSPITAL_BASED_OUTPATIENT_CLINIC_OR_DEPARTMENT_OTHER): Payer: Self-pay

## 2024-08-03 ENCOUNTER — Other Ambulatory Visit: Payer: Self-pay

## 2024-08-04 ENCOUNTER — Encounter: Payer: Self-pay | Admitting: Family

## 2024-08-05 ENCOUNTER — Other Ambulatory Visit: Payer: Self-pay

## 2024-08-05 ENCOUNTER — Other Ambulatory Visit (HOSPITAL_BASED_OUTPATIENT_CLINIC_OR_DEPARTMENT_OTHER): Payer: Self-pay

## 2024-08-05 DIAGNOSIS — H02403 Unspecified ptosis of bilateral eyelids: Secondary | ICD-10-CM

## 2024-08-05 MED ORDER — FENOFIBRATE MICRONIZED 134 MG PO CAPS: 134.0000 mg | ORAL_CAPSULE | Freq: Every day | ORAL | 3 refills | Status: AC

## 2024-08-05 MED ORDER — LEVOTHYROXINE SODIUM 25 MCG PO TABS: ORAL_TABLET | ORAL | 3 refills | Status: AC

## 2024-08-05 MED ORDER — GABAPENTIN 300 MG PO CAPS
300.0000 mg | ORAL_CAPSULE | Freq: Three times a day (TID) | ORAL | 0 refills | Status: AC
  Filled 2024-08-05: qty 270, 90d supply, fill #0

## 2025-06-16 ENCOUNTER — Ambulatory Visit: Admitting: Adult Health
